# Patient Record
Sex: Female | Born: 1985 | Race: Black or African American | Hispanic: No | Marital: Single | Smoking: Current every day smoker
Health system: Southern US, Community
[De-identification: ages and names within clinical notes are randomized; demographics above are authoritative.]

## PROBLEM LIST (undated history)

## (undated) DIAGNOSIS — K219 Gastro-esophageal reflux disease without esophagitis: Secondary | ICD-10-CM

## (undated) DIAGNOSIS — K2970 Gastritis, unspecified, without bleeding: Secondary | ICD-10-CM

## (undated) DIAGNOSIS — M549 Dorsalgia, unspecified: Secondary | ICD-10-CM

## (undated) DIAGNOSIS — I880 Nonspecific mesenteric lymphadenitis: Secondary | ICD-10-CM

## (undated) DIAGNOSIS — Z720 Tobacco use: Secondary | ICD-10-CM

## (undated) DIAGNOSIS — E282 Polycystic ovarian syndrome: Secondary | ICD-10-CM

## (undated) DIAGNOSIS — I10 Essential (primary) hypertension: Secondary | ICD-10-CM

## (undated) DIAGNOSIS — E669 Obesity, unspecified: Secondary | ICD-10-CM

## (undated) HISTORY — PX: FRACTURE SURGERY: SHX138

## (undated) HISTORY — DX: Polycystic ovarian syndrome: E28.2

## (undated) HISTORY — PX: CARPAL TUNNEL RELEASE: SHX101

---
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
## 2010-07-24 ENCOUNTER — Inpatient Hospital Stay (HOSPITAL_COMMUNITY): Admission: EM | Admit: 2010-07-24 | Discharge: 2010-07-28 | Payer: Self-pay | Source: Home / Self Care

## 2010-07-26 LAB — CBC
HCT: 37.1 % (ref 36.0–46.0)
Hemoglobin: 12.8 g/dL (ref 12.0–15.0)
MCH: 29.8 pg (ref 26.0–34.0)
MCHC: 34.5 g/dL (ref 30.0–36.0)
MCV: 86.3 fL (ref 78.0–100.0)
Platelets: 306 10*3/uL (ref 150–400)
RBC: 4.3 MIL/uL (ref 3.87–5.11)
RDW: 12.6 % (ref 11.5–15.5)
WBC: 12.4 10*3/uL — ABNORMAL HIGH (ref 4.0–10.5)

## 2010-07-26 LAB — DIFFERENTIAL
Basophils Absolute: 0 10*3/uL (ref 0.0–0.1)
Basophils Relative: 0 % (ref 0–1)
Eosinophils Absolute: 0.2 10*3/uL (ref 0.0–0.7)
Eosinophils Relative: 2 % (ref 0–5)
Lymphocytes Relative: 22 % (ref 12–46)
Lymphs Abs: 2.7 10*3/uL (ref 0.7–4.0)
Monocytes Absolute: 1.1 10*3/uL — ABNORMAL HIGH (ref 0.1–1.0)
Monocytes Relative: 9 % (ref 3–12)
Neutro Abs: 8.4 10*3/uL — ABNORMAL HIGH (ref 1.7–7.7)
Neutrophils Relative %: 67 % (ref 43–77)

## 2010-07-26 LAB — BASIC METABOLIC PANEL
BUN: 2 mg/dL — ABNORMAL LOW (ref 6–23)
CO2: 25 mEq/L (ref 19–32)
Calcium: 8.4 mg/dL (ref 8.4–10.5)
Chloride: 107 mEq/L (ref 96–112)
Creatinine, Ser: 0.68 mg/dL (ref 0.4–1.2)
GFR calc Af Amer: >60 mL/min (ref >60)
GFR calc non Af Amer: >60 mL/min (ref >60)
Glucose, Bld: 75 mg/dL (ref 70–99)
Potassium: 3.3 mEq/L — ABNORMAL LOW (ref 3.5–5.1)
Sodium: 140 mEq/L (ref 135–145)

## 2010-07-27 LAB — CBC
HCT: 36.4 % (ref 36.0–46.0)
Hemoglobin: 12.7 g/dL (ref 12.0–15.0)
MCH: 29.9 pg (ref 26.0–34.0)
MCHC: 34.9 g/dL (ref 30.0–36.0)
MCV: 85.6 fL (ref 78.0–100.0)
Platelets: 278 10*3/uL (ref 150–400)
RBC: 4.25 MIL/uL (ref 3.87–5.11)
RDW: 12.5 % (ref 11.5–15.5)
WBC: 8.4 10*3/uL (ref 4.0–10.5)

## 2010-07-27 LAB — BASIC METABOLIC PANEL
BUN: 5 mg/dL — ABNORMAL LOW (ref 6–23)
CO2: 22 mEq/L (ref 19–32)
Calcium: 8.3 mg/dL — ABNORMAL LOW (ref 8.4–10.5)
Chloride: 107 mEq/L (ref 96–112)
Creatinine, Ser: 0.75 mg/dL (ref 0.4–1.2)
GFR calc Af Amer: >60 mL/min (ref >60)
GFR calc non Af Amer: >60 mL/min (ref >60)
Glucose, Bld: 92 mg/dL (ref 70–99)
Potassium: 3.4 mEq/L — ABNORMAL LOW (ref 3.5–5.1)
Sodium: 138 mEq/L (ref 135–145)

## 2010-07-27 LAB — DIFFERENTIAL
Basophils Absolute: 0 10*3/uL (ref 0.0–0.1)
Basophils Relative: 0 % (ref 0–1)
Eosinophils Absolute: 0.3 10*3/uL (ref 0.0–0.7)
Eosinophils Relative: 4 % (ref 0–5)
Lymphocytes Relative: 28 % (ref 12–46)
Lymphs Abs: 2.3 10*3/uL (ref 0.7–4.0)
Monocytes Absolute: 1 10*3/uL (ref 0.1–1.0)
Monocytes Relative: 11 % (ref 3–12)
Neutro Abs: 4.8 10*3/uL (ref 1.7–7.7)
Neutrophils Relative %: 57 % (ref 43–77)

## 2010-07-27 LAB — LIPID PANEL
Cholesterol: 121 mg/dL (ref 0–200)
HDL: 35 mg/dL — ABNORMAL LOW (ref >39)
LDL Cholesterol: 72 mg/dL (ref 0–99)
Total CHOL/HDL Ratio: 3.5 RATIO
Triglycerides: 72 mg/dL (ref <150)
VLDL: 14 mg/dL (ref 0–40)

## 2010-07-27 LAB — MAGNESIUM: Magnesium: 1.8 mg/dL (ref 1.5–2.5)

## 2010-07-27 LAB — PHOSPHORUS: Phosphorus: 3 mg/dL (ref 2.3–4.6)

## 2010-07-28 LAB — BASIC METABOLIC PANEL
BUN: 4 mg/dL — ABNORMAL LOW (ref 6–23)
CO2: 26 mEq/L (ref 19–32)
Calcium: 9 mg/dL (ref 8.4–10.5)
Chloride: 104 mEq/L (ref 96–112)
Creatinine, Ser: 0.8 mg/dL (ref 0.4–1.2)
GFR calc Af Amer: >60 mL/min (ref >60)
GFR calc non Af Amer: >60 mL/min (ref >60)
Glucose, Bld: 79 mg/dL (ref 70–99)
Potassium: 3.6 mEq/L (ref 3.5–5.1)
Sodium: 139 mEq/L (ref 135–145)

## 2010-07-28 LAB — DIFFERENTIAL
Basophils Absolute: 0 10*3/uL (ref 0.0–0.1)
Basophils Relative: 0 % (ref 0–1)
Eosinophils Absolute: 0.3 10*3/uL (ref 0.0–0.7)
Eosinophils Relative: 3 % (ref 0–5)
Lymphocytes Relative: 26 % (ref 12–46)
Lymphs Abs: 2.2 10*3/uL (ref 0.7–4.0)
Monocytes Absolute: 1 10*3/uL (ref 0.1–1.0)
Monocytes Relative: 12 % (ref 3–12)
Neutro Abs: 5.1 10*3/uL (ref 1.7–7.7)
Neutrophils Relative %: 59 % (ref 43–77)

## 2010-07-28 LAB — CBC
HCT: 40.2 % (ref 36.0–46.0)
Hemoglobin: 14.4 g/dL (ref 12.0–15.0)
MCH: 30.6 pg (ref 26.0–34.0)
MCHC: 35.8 g/dL (ref 30.0–36.0)
MCV: 85.4 fL (ref 78.0–100.0)
Platelets: 310 10*3/uL (ref 150–400)
RBC: 4.71 MIL/uL (ref 3.87–5.11)
RDW: 12.2 % (ref 11.5–15.5)
WBC: 8.5 10*3/uL (ref 4.0–10.5)

## 2010-07-28 LAB — MAGNESIUM: Magnesium: 1.9 mg/dL (ref 1.5–2.5)

## 2010-10-02 LAB — COMPREHENSIVE METABOLIC PANEL
ALT: 17 U/L (ref 0–35)
AST: 20 U/L (ref 0–37)
Albumin: 4.5 g/dL (ref 3.5–5.2)
Alkaline Phosphatase: 98 U/L (ref 39–117)
BUN: 7 mg/dL (ref 6–23)
CO2: 26 mEq/L (ref 19–32)
Calcium: 9 mg/dL (ref 8.4–10.5)
Chloride: 106 mEq/L (ref 96–112)
Creatinine, Ser: 0.72 mg/dL (ref 0.4–1.2)
GFR calc Af Amer: >60 mL/min (ref >60)
GFR calc non Af Amer: >60 mL/min (ref >60)
Glucose, Bld: 138 mg/dL — ABNORMAL HIGH (ref 70–99)
Potassium: 4.1 mEq/L (ref 3.5–5.1)
Sodium: 140 mEq/L (ref 135–145)
Total Bilirubin: 0.6 mg/dL (ref 0.3–1.2)
Total Protein: 8.1 g/dL (ref 6.0–8.3)

## 2010-10-02 LAB — DIFFERENTIAL
Basophils Absolute: 0 10*3/uL (ref 0.0–0.1)
Basophils Relative: 0 % (ref 0–1)
Eosinophils Absolute: 0 10*3/uL (ref 0.0–0.7)
Eosinophils Relative: 0 % (ref 0–5)
Lymphocytes Relative: 8 % — ABNORMAL LOW (ref 12–46)
Lymphs Abs: 1.5 10*3/uL (ref 0.7–4.0)
Monocytes Absolute: 0.8 10*3/uL (ref 0.1–1.0)
Monocytes Relative: 4 % (ref 3–12)
Neutro Abs: 16.5 10*3/uL — ABNORMAL HIGH (ref 1.7–7.7)
Neutrophils Relative %: 88 % — ABNORMAL HIGH (ref 43–77)

## 2010-10-02 LAB — URINALYSIS, ROUTINE W REFLEX MICROSCOPIC
Bilirubin Urine: NEGATIVE
Glucose, UA: NEGATIVE mg/dL
Hgb urine dipstick: NEGATIVE
Ketones, ur: NEGATIVE mg/dL
Nitrite: NEGATIVE
Protein, ur: NEGATIVE mg/dL
Specific Gravity, Urine: 1.02 (ref 1.005–1.030)
Urobilinogen, UA: 0.2 mg/dL (ref 0.0–1.0)
pH: 7.5 (ref 5.0–8.0)

## 2010-10-02 LAB — CBC
HCT: 42.8 % (ref 36.0–46.0)
Hemoglobin: 14.6 g/dL (ref 12.0–15.0)
MCH: 29.8 pg (ref 26.0–34.0)
MCHC: 34.1 g/dL (ref 30.0–36.0)
MCV: 87.3 fL (ref 78.0–100.0)
Platelets: 332 10*3/uL (ref 150–400)
RBC: 4.9 MIL/uL (ref 3.87–5.11)
RDW: 12.7 % (ref 11.5–15.5)
WBC: 18.8 10*3/uL — ABNORMAL HIGH (ref 4.0–10.5)

## 2010-10-02 LAB — MRSA CULTURE

## 2010-10-02 LAB — LIPASE, BLOOD: Lipase: 29 U/L (ref 11–59)

## 2010-10-02 LAB — MRSA PCR SCREENING

## 2010-10-02 LAB — POCT PREGNANCY, URINE: Preg Test, Ur: NEGATIVE

## 2015-01-22 ENCOUNTER — Encounter (HOSPITAL_BASED_OUTPATIENT_CLINIC_OR_DEPARTMENT_OTHER): Payer: Self-pay | Admitting: *Deleted

## 2015-01-22 ENCOUNTER — Emergency Department (HOSPITAL_BASED_OUTPATIENT_CLINIC_OR_DEPARTMENT_OTHER)
Admission: EM | Admit: 2015-01-22 | Discharge: 2015-01-22 | Disposition: A | Discharge status: Home / Self Care | Payer: No Typology Code available for payment source | Attending: Emergency Medicine | Admitting: Emergency Medicine

## 2015-01-22 ENCOUNTER — Emergency Department (HOSPITAL_BASED_OUTPATIENT_CLINIC_OR_DEPARTMENT_OTHER): Payer: No Typology Code available for payment source

## 2015-01-22 DIAGNOSIS — Z3202 Encounter for pregnancy test, result negative: Secondary | ICD-10-CM | POA: Insufficient documentation

## 2015-01-22 DIAGNOSIS — R1084 Generalized abdominal pain: Secondary | ICD-10-CM | POA: Insufficient documentation

## 2015-01-22 DIAGNOSIS — K219 Gastro-esophageal reflux disease without esophagitis: Secondary | ICD-10-CM | POA: Diagnosis not present

## 2015-01-22 DIAGNOSIS — R109 Unspecified abdominal pain: Secondary | ICD-10-CM

## 2015-01-22 DIAGNOSIS — Z720 Tobacco use: Secondary | ICD-10-CM | POA: Diagnosis not present

## 2015-01-22 DIAGNOSIS — R110 Nausea: Secondary | ICD-10-CM | POA: Insufficient documentation

## 2015-01-22 DIAGNOSIS — R52 Pain, unspecified: Secondary | ICD-10-CM

## 2015-01-22 HISTORY — DX: Gastro-esophageal reflux disease without esophagitis: K21.9

## 2015-01-22 LAB — CBC WITH DIFFERENTIAL/PLATELET
Basophils Absolute: 0 10*3/uL (ref 0.0–0.1)
Basophils Relative: 0 % (ref 0–1)
Eosinophils Absolute: 0 10*3/uL (ref 0.0–0.7)
Eosinophils Relative: 0 % (ref 0–5)
HEMATOCRIT: 46.6 % — AB (ref 36.0–46.0)
HEMOGLOBIN: 16 g/dL — AB (ref 12.0–15.0)
LYMPHS ABS: 2.6 10*3/uL (ref 0.7–4.0)
Lymphocytes Relative: 14 % (ref 12–46)
MCH: 30 pg (ref 26.0–34.0)
MCHC: 34.3 g/dL (ref 30.0–36.0)
MCV: 87.3 fL (ref 78.0–100.0)
MONO ABS: 0.6 10*3/uL (ref 0.1–1.0)
MONOS PCT: 3 % (ref 3–12)
NEUTROS PCT: 83 % — AB (ref 43–77)
Neutro Abs: 16.3 10*3/uL — ABNORMAL HIGH (ref 1.7–7.7)
Platelets: 394 10*3/uL (ref 150–400)
RBC: 5.34 MIL/uL — ABNORMAL HIGH (ref 3.87–5.11)
RDW: 12.9 % (ref 11.5–15.5)
WBC: 19.6 10*3/uL — ABNORMAL HIGH (ref 4.0–10.5)

## 2015-01-22 LAB — COMPREHENSIVE METABOLIC PANEL
ALBUMIN: 4.4 g/dL (ref 3.5–5.0)
ALT: 24 U/L (ref 14–54)
AST: 32 U/L (ref 15–41)
Alkaline Phosphatase: 92 U/L (ref 38–126)
Anion gap: 12 (ref 5–15)
BUN: 13 mg/dL (ref 6–20)
CALCIUM: 9.3 mg/dL (ref 8.9–10.3)
CHLORIDE: 100 mmol/L — AB (ref 101–111)
CO2: 25 mmol/L (ref 22–32)
Creatinine, Ser: 0.77 mg/dL (ref 0.44–1.00)
GFR calc Af Amer: >60 mL/min (ref >60)
GFR calc non Af Amer: >60 mL/min (ref >60)
GLUCOSE: 129 mg/dL — AB (ref 65–99)
Potassium: 3.1 mmol/L — ABNORMAL LOW (ref 3.5–5.1)
SODIUM: 137 mmol/L (ref 135–145)
Total Bilirubin: 0.8 mg/dL (ref 0.3–1.2)
Total Protein: 8.4 g/dL — ABNORMAL HIGH (ref 6.5–8.1)

## 2015-01-22 LAB — URINALYSIS, ROUTINE W REFLEX MICROSCOPIC
BILIRUBIN URINE: NEGATIVE
Glucose, UA: NEGATIVE mg/dL
KETONES UR: NEGATIVE mg/dL
Leukocytes, UA: NEGATIVE
Nitrite: NEGATIVE
PROTEIN: NEGATIVE mg/dL
SPECIFIC GRAVITY, URINE: 1.01 (ref 1.005–1.030)
Urobilinogen, UA: 0.2 mg/dL (ref 0.0–1.0)
pH: 6 (ref 5.0–8.0)

## 2015-01-22 LAB — URINE MICROSCOPIC-ADD ON

## 2015-01-22 LAB — LIPASE, BLOOD: Lipase: 12 U/L — ABNORMAL LOW (ref 22–51)

## 2015-01-22 LAB — PREGNANCY, URINE: Preg Test, Ur: NEGATIVE

## 2015-01-22 MED ORDER — HYDROCODONE-ACETAMINOPHEN 5-325 MG PO TABS: 2.0000 | ORAL_TABLET | ORAL | Status: DC | PRN

## 2015-01-22 MED ORDER — IOHEXOL 300 MG/ML  SOLN
100.0000 mL | Freq: Once | INTRAMUSCULAR | Status: AC | PRN
  Administered 2015-01-22: 100 mL via INTRAVENOUS

## 2015-01-22 MED ORDER — IOHEXOL 300 MG/ML  SOLN
50.0000 mL | Freq: Once | INTRAMUSCULAR | Status: AC | PRN
  Administered 2015-01-22: 50 mL via ORAL

## 2015-01-22 MED ORDER — ONDANSETRON 4 MG PO TBDP: 4.0000 mg | ORAL_TABLET | Freq: Three times a day (TID) | ORAL | Status: DC | PRN

## 2015-01-22 MED ORDER — HYDROMORPHONE HCL 1 MG/ML IJ SOLN
1.0000 mg | Freq: Once | INTRAMUSCULAR | Status: AC
  Administered 2015-01-22: 1 mg via INTRAVENOUS
  Filled 2015-01-22: qty 1

## 2015-01-22 MED ORDER — SODIUM CHLORIDE 0.9 % IV BOLUS (SEPSIS)
1000.0000 mL | Freq: Once | INTRAVENOUS | Status: AC
  Administered 2015-01-22: 1000 mL via INTRAVENOUS

## 2015-01-22 MED ORDER — GI COCKTAIL ~~LOC~~
30.0000 mL | Freq: Once | ORAL | Status: AC
  Administered 2015-01-22: 30 mL via ORAL
  Filled 2015-01-22: qty 30

## 2015-01-22 MED ORDER — ONDANSETRON HCL 4 MG/2ML IJ SOLN
4.0000 mg | Freq: Once | INTRAMUSCULAR | Status: AC
  Administered 2015-01-22: 4 mg via INTRAMUSCULAR
  Filled 2015-01-22: qty 2

## 2015-01-22 MED ORDER — FAMOTIDINE IN NACL 20-0.9 MG/50ML-% IV SOLN
20.0000 mg | Freq: Once | INTRAVENOUS | Status: AC
  Administered 2015-01-22: 20 mg via INTRAVENOUS
  Filled 2015-01-22: qty 50

## 2015-01-22 NOTE — ED Notes (Signed)
[place] NOT YET COMPLETED.  Scheduled due at 1400 on 01/23/2015.

## 2015-01-22 NOTE — ED Provider Notes (Signed)
CSN: [id number]     Arrival date & time 01/22/15  1741 History   First MD Initiated Contact with Patient 01/22/15 1818     Chief Complaint  Patient presents with  . Gastrophageal Reflux     (Consider location/radiation/quality/duration/timing/severity/associated sxs/prior Treatment) Patient is a 29 y.o. female presenting with abdominal pain. The history is provided by the patient. No language interpreter was used.  Abdominal Pain Pain location:  Generalized Pain quality: aching   Pain radiates to:  Does not radiate Pain severity:  Moderate Onset quality:  Gradual Timing:  Constant Progression:  Worsening Chronicity:  New Context: not previous surgeries and not recent illness   Worsened by:  Nothing tried Ineffective treatments:  None tried Associated symptoms: nausea   Associated symptoms: no diarrhea   Risk factors: no alcohol abuse   Pt report she has reflux and today she has vomitting.  Pt has been taking nexium over the counter.  Pt reports pain in upper abdomen.   Past Medical History  Diagnosis Date  . GERD (gastroesophageal reflux disease)    Past Surgical History  Procedure Laterality Date  . Ankle surgery     History reviewed. No pertinent family history. History  Substance Use Topics  . Smoking status: Current Every Day Smoker -- 0.50 packs/day    Types: Cigarettes  . Smokeless tobacco: Not on file  . Alcohol Use: No   OB History    No data available     Review of Systems  Gastrointestinal: Positive for nausea and abdominal pain. Negative for diarrhea.  All other systems reviewed and are negative.     Allergies  Review of patient's allergies indicates no known allergies.  Home Medications   Prior to Admission medications   Medication Sig Start Date End Date Taking? Authorizing Provider  omeprazole (PRILOSEC) 40 MG capsule Take 40 mg by mouth daily.   Yes Historical Provider, MD   BP 127/68 mmHg  Pulse 68  Temp(Src) 99.3 F (37.4 C) (Oral)   Resp 20  Ht 5\' 7"  (1.702 m)  Wt 212 lb (96.163 kg)  BMI 33.20 kg/m2  SpO2 100%  LMP 11/22/2014 Physical Exam  Constitutional: She is oriented to person, place, and time. She appears well-developed and well-nourished.  HENT:  Head: Normocephalic.  Eyes: Conjunctivae and EOM are normal. Pupils are equal, round, and reactive to light.  Neck: Normal range of motion.  Cardiovascular: Normal rate and intact distal pulses.   Pulmonary/Chest: Effort normal.  Abdominal: Soft. She exhibits no distension. There is tenderness.  Diffuse tenderness uper abdomen  Mid upper rand left abdomen.    Musculoskeletal: Normal range of motion.  Neurological: She is alert and oriented to person, place, and time.  Psychiatric: She has a normal mood and affect.  Nursing note and vitals reviewed.   ED Course  Procedures (including critical care time) Labs Review Labs Reviewed  CBC WITH DIFFERENTIAL/PLATELET - Abnormal; Notable for the following:    WBC 19.6 (*)    RBC 5.34 (*)    Hemoglobin 16.0 (*)    HCT 46.6 (*)    Neutrophils Relative % 83 (*)    Neutro Abs 16.3 (*)    All other components within normal limits  COMPREHENSIVE METABOLIC PANEL - Abnormal; Notable for the following:    Potassium 3.1 (*)    Chloride 100 (*)    Glucose, Bld 129 (*)    Total Protein 8.4 (*)    All other components within normal limits  LIPASE, BLOOD -  Abnormal; Notable for the following:    Lipase 12 (*)    All other components within normal limits  URINALYSIS, ROUTINE W REFLEX MICROSCOPIC (NOT AT [facility]) - Abnormal; Notable for the following:    APPearance CLOUDY (*)    Hgb urine dipstick LARGE (*)    All other components within normal limits  URINE MICROSCOPIC-ADD ON - Abnormal; Notable for the following:    Squamous Epithelial / LPF MANY (*)    Bacteria, UA MANY (*)    All other components within normal limits  PREGNANCY, URINE    Imaging Review No results found.   EKG Interpretation None      MDM  Pt  reports relief with zofran pepcic and dilaudid.  Pt reports discomfort increased after drinking contrast.  Ct scan shows no evidence of gallstones and normal bili duct.   Pt has elevated wbc's.    Pt advised to return here at 2pm tomorrow for ultrasound and recheck.   Pt to continue otc medications.     Final diagnoses:  Abdominal pain  Pain        Fransico Meadow, PA-C 01/22/15 [place], MD 01/22/15 [phone]

## 2015-01-22 NOTE — Discharge Instructions (Signed)

## 2015-01-22 NOTE — ED Notes (Signed)
Pt reports acid reflux with epigastric pain and vomiting bile since Thursday.

## 2015-01-23 ENCOUNTER — Inpatient Hospital Stay (HOSPITAL_BASED_OUTPATIENT_CLINIC_OR_DEPARTMENT_OTHER): Admit: 2015-01-23 | Payer: Self-pay

## 2015-01-23 ENCOUNTER — Encounter (HOSPITAL_BASED_OUTPATIENT_CLINIC_OR_DEPARTMENT_OTHER): Payer: Self-pay | Admitting: *Deleted

## 2015-01-23 ENCOUNTER — Inpatient Hospital Stay (HOSPITAL_BASED_OUTPATIENT_CLINIC_OR_DEPARTMENT_OTHER)
Admission: EM | Admit: 2015-01-23 | Discharge: 2015-01-25 | DRG: 392 | Disposition: A | Discharge status: Home / Self Care | Payer: No Typology Code available for payment source | Attending: Internal Medicine | Admitting: Internal Medicine

## 2015-01-23 DIAGNOSIS — Z79899 Other long term (current) drug therapy: Secondary | ICD-10-CM

## 2015-01-23 DIAGNOSIS — R7989 Other specified abnormal findings of blood chemistry: Secondary | ICD-10-CM

## 2015-01-23 DIAGNOSIS — M79662 Pain in left lower leg: Secondary | ICD-10-CM | POA: Diagnosis present

## 2015-01-23 DIAGNOSIS — R05 Cough: Secondary | ICD-10-CM | POA: Diagnosis present

## 2015-01-23 DIAGNOSIS — K210 Gastro-esophageal reflux disease with esophagitis: Secondary | ICD-10-CM | POA: Diagnosis present

## 2015-01-23 DIAGNOSIS — K859 Acute pancreatitis, unspecified: Secondary | ICD-10-CM

## 2015-01-23 DIAGNOSIS — M79609 Pain in unspecified limb: Secondary | ICD-10-CM | POA: Diagnosis not present

## 2015-01-23 DIAGNOSIS — R0789 Other chest pain: Secondary | ICD-10-CM | POA: Diagnosis not present

## 2015-01-23 DIAGNOSIS — R1013 Epigastric pain: Principal | ICD-10-CM | POA: Diagnosis present

## 2015-01-23 DIAGNOSIS — K219 Gastro-esophageal reflux disease without esophagitis: Secondary | ICD-10-CM | POA: Diagnosis present

## 2015-01-23 DIAGNOSIS — E876 Hypokalemia: Secondary | ICD-10-CM | POA: Diagnosis present

## 2015-01-23 DIAGNOSIS — R1110 Vomiting, unspecified: Secondary | ICD-10-CM

## 2015-01-23 DIAGNOSIS — R079 Chest pain, unspecified: Secondary | ICD-10-CM

## 2015-01-23 DIAGNOSIS — K922 Gastrointestinal hemorrhage, unspecified: Secondary | ICD-10-CM | POA: Diagnosis not present

## 2015-01-23 DIAGNOSIS — Z8249 Family history of ischemic heart disease and other diseases of the circulatory system: Secondary | ICD-10-CM

## 2015-01-23 DIAGNOSIS — F1210 Cannabis abuse, uncomplicated: Secondary | ICD-10-CM | POA: Diagnosis not present

## 2015-01-23 DIAGNOSIS — R748 Abnormal levels of other serum enzymes: Secondary | ICD-10-CM | POA: Diagnosis present

## 2015-01-23 DIAGNOSIS — R059 Cough, unspecified: Secondary | ICD-10-CM | POA: Diagnosis present

## 2015-01-23 DIAGNOSIS — K449 Diaphragmatic hernia without obstruction or gangrene: Secondary | ICD-10-CM | POA: Diagnosis present

## 2015-01-23 DIAGNOSIS — Z6834 Body mass index (BMI) 34.0-34.9, adult: Secondary | ICD-10-CM | POA: Diagnosis not present

## 2015-01-23 DIAGNOSIS — R109 Unspecified abdominal pain: Secondary | ICD-10-CM

## 2015-01-23 DIAGNOSIS — Z720 Tobacco use: Secondary | ICD-10-CM | POA: Diagnosis not present

## 2015-01-23 DIAGNOSIS — E669 Obesity, unspecified: Secondary | ICD-10-CM | POA: Diagnosis present

## 2015-01-23 DIAGNOSIS — R778 Other specified abnormalities of plasma proteins: Secondary | ICD-10-CM

## 2015-01-23 DIAGNOSIS — F17210 Nicotine dependence, cigarettes, uncomplicated: Secondary | ICD-10-CM | POA: Diagnosis present

## 2015-01-23 DIAGNOSIS — D72829 Elevated white blood cell count, unspecified: Secondary | ICD-10-CM | POA: Diagnosis present

## 2015-01-23 DIAGNOSIS — R072 Precordial pain: Secondary | ICD-10-CM | POA: Diagnosis not present

## 2015-01-23 HISTORY — DX: Obesity, unspecified: E66.9

## 2015-01-23 HISTORY — DX: Tobacco use: Z72.0

## 2015-01-23 HISTORY — DX: Nonspecific mesenteric lymphadenitis: I88.0

## 2015-01-23 LAB — CBC WITH DIFFERENTIAL/PLATELET
BASOS ABS: 0 10*3/uL (ref 0.0–0.1)
Basophils Relative: 0 % (ref 0–1)
Eosinophils Absolute: 0.1 10*3/uL (ref 0.0–0.7)
Eosinophils Relative: 1 % (ref 0–5)
HCT: 43.2 % (ref 36.0–46.0)
Hemoglobin: 14.5 g/dL (ref 12.0–15.0)
LYMPHS ABS: 5.5 10*3/uL — AB (ref 0.7–4.0)
LYMPHS PCT: 36 % (ref 12–46)
MCH: 29.7 pg (ref 26.0–34.0)
MCHC: 33.6 g/dL (ref 30.0–36.0)
MCV: 88.5 fL (ref 78.0–100.0)
MONO ABS: 1.2 10*3/uL — AB (ref 0.1–1.0)
Monocytes Relative: 8 % (ref 3–12)
Neutro Abs: 8.7 10*3/uL — ABNORMAL HIGH (ref 1.7–7.7)
Neutrophils Relative %: 55 % (ref 43–77)
PLATELETS: 340 10*3/uL (ref 150–400)
RBC: 4.88 MIL/uL (ref 3.87–5.11)
RDW: 12.9 % (ref 11.5–15.5)
WBC: 15.6 10*3/uL — AB (ref 4.0–10.5)

## 2015-01-23 LAB — COMPREHENSIVE METABOLIC PANEL
ALK PHOS: 80 U/L (ref 38–126)
ALT: 26 U/L (ref 14–54)
AST: 35 U/L (ref 15–41)
Albumin: 4.3 g/dL (ref 3.5–5.0)
Anion gap: 10 (ref 5–15)
BUN: 10 mg/dL (ref 6–20)
CALCIUM: 8.9 mg/dL (ref 8.9–10.3)
CO2: 28 mmol/L (ref 22–32)
Chloride: 101 mmol/L (ref 101–111)
Creatinine, Ser: 0.8 mg/dL (ref 0.44–1.00)
GFR calc Af Amer: >60 mL/min (ref >60)
GFR calc non Af Amer: >60 mL/min (ref >60)
Glucose, Bld: 115 mg/dL — ABNORMAL HIGH (ref 65–99)
Potassium: 2.8 mmol/L — ABNORMAL LOW (ref 3.5–5.1)
SODIUM: 139 mmol/L (ref 135–145)
TOTAL PROTEIN: 7.2 g/dL (ref 6.5–8.1)
Total Bilirubin: 0.5 mg/dL (ref 0.3–1.2)

## 2015-01-23 LAB — LIPASE, BLOOD: Lipase: 58 U/L — ABNORMAL HIGH (ref 22–51)

## 2015-01-23 LAB — URINALYSIS, ROUTINE W REFLEX MICROSCOPIC
Bilirubin Urine: NEGATIVE
GLUCOSE, UA: NEGATIVE mg/dL
Ketones, ur: NEGATIVE mg/dL
Leukocytes, UA: NEGATIVE
NITRITE: NEGATIVE
PH: 7 (ref 5.0–8.0)
Protein, ur: NEGATIVE mg/dL
SPECIFIC GRAVITY, URINE: 1.015 (ref 1.005–1.030)
Urobilinogen, UA: 0.2 mg/dL (ref 0.0–1.0)

## 2015-01-23 LAB — URINE MICROSCOPIC-ADD ON

## 2015-01-23 MED ORDER — MORPHINE SULFATE 4 MG/ML IJ SOLN
4.0000 mg | Freq: Once | INTRAMUSCULAR | Status: AC
  Administered 2015-01-23: 4 mg via INTRAVENOUS
  Filled 2015-01-23: qty 1

## 2015-01-23 MED ORDER — PANTOPRAZOLE SODIUM 40 MG IV SOLR
40.0000 mg | Freq: Once | INTRAVENOUS | Status: AC
  Administered 2015-01-23: 40 mg via INTRAVENOUS
  Filled 2015-01-23: qty 40

## 2015-01-23 MED ORDER — PROMETHAZINE HCL 25 MG/ML IJ SOLN
25.0000 mg | Freq: Four times a day (QID) | INTRAMUSCULAR | Status: DC | PRN
  Administered 2015-01-23: 25 mg via INTRAVENOUS
  Filled 2015-01-23: qty 1

## 2015-01-23 MED ORDER — SODIUM CHLORIDE 0.9 % IV BOLUS (SEPSIS)
1000.0000 mL | Freq: Once | INTRAVENOUS | Status: AC
  Administered 2015-01-23: 1000 mL via INTRAVENOUS

## 2015-01-23 MED ORDER — POTASSIUM CHLORIDE 10 MEQ/100ML IV SOLN
10.0000 meq | INTRAVENOUS | Status: AC
  Administered 2015-01-23 – 2015-01-24 (×2): 10 meq via INTRAVENOUS
  Filled 2015-01-23: qty 100

## 2015-01-23 MED ORDER — MORPHINE SULFATE 4 MG/ML IJ SOLN
4.0000 mg | INTRAMUSCULAR | Status: DC | PRN
  Administered 2015-01-23: 4 mg via INTRAVENOUS
  Filled 2015-01-23: qty 1

## 2015-01-23 MED ORDER — POTASSIUM CHLORIDE 10 MEQ/100ML IV SOLN
10.0000 meq | Freq: Once | INTRAVENOUS | Status: AC
  Administered 2015-01-23: 10 meq via INTRAVENOUS
  Filled 2015-01-23: qty 100

## 2015-01-23 MED ORDER — ONDANSETRON HCL 4 MG/2ML IJ SOLN
INTRAMUSCULAR | Status: AC
Start: 2015-01-23 — End: 2015-01-23
  Administered 2015-01-23: 4 mg
  Filled 2015-01-23: qty 2

## 2015-01-23 NOTE — ED Notes (Signed)
Pt reports n/v and abdominal pain since Thursday. Seen here for the same yesterday, was scheduled for u/s this morning, unable to come because she was feeling sick.

## 2015-01-23 NOTE — ED Provider Notes (Addendum)
CSN: [id number]     Arrival date & time 01/23/15  2046 History   This chart was scribed for  Jasmine Shanks, MD by Altamease Oiler, ED Scribe. This patient was seen in room MH09/MH09 and the patient's care was started at 9:14 PM.    Chief Complaint  Patient presents with  . Abdominal Pain      HPI Jasmine Mason is a 29 y.o. female who presents to the Emergency Department complaining of constant upper abdominal pain with onset 3-4 days PTA. The pain radiates to the back and chest and pt notes that it significantly worsened yesterday. This pain is different than her usual pain with GERD but she notes being unable to keep down her GERD medication. Associated symptoms include subjective fever,  nausea for 4 days, several episodes of emesis per day, 1 day of diarrhea, and decreased urinary output. Pt denies rash, dysuria, and LE swelling or pain. She was seen in the ED yesterday for the similar symptoms where she had a CT A/P and was to f/u for an abdominal US today. She did not f/u for the ultrasound because she was feeling to poorly.  Past Medical History  Diagnosis Date  . GERD (gastroesophageal reflux disease)    Past Surgical History  Procedure Laterality Date  . Ankle surgery     No family history on file. History  Substance Use Topics  . Smoking status: Current Every Day Smoker -- 0.50 packs/day    Types: Cigarettes  . Smokeless tobacco: Not on file  . Alcohol Use: No   OB History    No data available     Review of Systems  Constitutional: Positive for fever (subjective).  Cardiovascular: Negative for leg swelling.  Gastrointestinal: Positive for nausea, vomiting, abdominal pain and diarrhea.  Genitourinary: Positive for decreased urine volume. Negative for dysuria.  Skin: Negative for rash.  10 Systems reviewed and are negative for acute change except as noted in the HPI.     Allergies  Review of patient's allergies indicates no known allergies.  Home Medications    Prior to Admission medications   Medication Sig Start Date End Date Taking? Authorizing Provider  HYDROcodone-acetaminophen (NORCO/VICODIN) 5-325 MG per tablet Take 2 tablets by mouth every 4 (four) hours as needed. 01/22/15   Fransico Meadow, PA-C  omeprazole (PRILOSEC) 40 MG capsule Take 40 mg by mouth daily.    Historical Provider, MD  ondansetron (ZOFRAN ODT) 4 MG disintegrating tablet Take 1 tablet (4 mg total) by mouth every 8 (eight) hours as needed for nausea or vomiting. 01/22/15   Fransico Meadow, PA-C   Triage Vitals: BP 169/106 mmHg  Pulse 84  Temp(Src) 98.5 F (36.9 C) (Oral)  Resp 23  Ht 5\' 7"  (1.702 m)  Wt 212 lb (96.163 kg)  BMI 33.20 kg/m2  SpO2 98%  LMP 11/22/2014  Physical Exam  Constitutional: She is oriented to person, place, and time. She appears well-developed and well-nourished.  The patient appears uncomfortable. She does not have acute respiratory distress. She is nontoxic.  HENT:  Head: Normocephalic and atraumatic.  Mouth/Throat: Oropharynx is clear and moist.  Eyes: Conjunctivae and EOM are normal. Pupils are equal, round, and reactive to light.  Neck: Neck supple.  Cardiovascular: Normal rate, regular rhythm, normal heart sounds and intact distal pulses.   Pulmonary/Chest: Effort normal and breath sounds normal.  Abdominal: Soft. Bowel sounds are normal. She exhibits no distension. There is tenderness.  Patient endorses tenderness through the mid and  epigastric area of the abdomen. She is not having guarding or rebound.  Musculoskeletal: Normal range of motion. She exhibits no edema or tenderness.  Neurological: She is alert and oriented to person, place, and time. She has normal strength. No cranial nerve deficit. She exhibits normal muscle tone. Coordination normal. GCS eye subscore is 4. GCS verbal subscore is 5. GCS motor subscore is 6.  Skin: Skin is warm, dry and intact.  Psychiatric: She has a normal mood and affect.    ED Course  Procedures   DIAGNOSTIC STUDIES: Oxygen Saturation is 98% on RA, normal by my interpretation.    COORDINATION OF CARE: 9:16 PM Discussed treatment plan which includes IVF, morphine, Zofran, and lab work with pt at bedside and pt agreed to plan.  10:33 PM-Consult complete with HOSPITALIST. Patient case explained and discussed. HOSPITALISTagrees to admit patient for further evaluation and treatment. Call ended at 10:35 PM.   Labs Review Labs Reviewed  COMPREHENSIVE METABOLIC PANEL - Abnormal; Notable for the following:    Potassium 2.8 (*)    Glucose, Bld 115 (*)    All other components within normal limits  LIPASE, BLOOD - Abnormal; Notable for the following:    Lipase 58 (*)    All other components within normal limits  CBC WITH DIFFERENTIAL/PLATELET - Abnormal; Notable for the following:    WBC 15.6 (*)    Neutro Abs 8.7 (*)    Lymphs Abs 5.5 (*)    Monocytes Absolute 1.2 (*)    All other components within normal limits  URINALYSIS, ROUTINE W REFLEX MICROSCOPIC (NOT AT [facility]) - Abnormal; Notable for the following:    APPearance CLOUDY (*)    Hgb urine dipstick LARGE (*)    All other components within normal limits  URINE MICROSCOPIC-ADD ON - Abnormal; Notable for the following:    Squamous Epithelial / LPF MANY (*)    Bacteria, UA MANY (*)    All other components within normal limits    Imaging Review Ct Abdomen Pelvis W Contrast  01/22/2015   CLINICAL DATA:  Epigastric pain and vomiting, 3 days duration. Gross hematuria is well. Elevated white count.  EXAM: CT ABDOMEN AND PELVIS WITH CONTRAST  TECHNIQUE: Multidetector CT imaging of the abdomen and pelvis was performed using the standard protocol following bolus administration of intravenous contrast.  CONTRAST:  71mL OMNIPAQUE IOHEXOL 300 MG/ML SOLN, 115mL OMNIPAQUE IOHEXOL 300 MG/ML SOLN  COMPARISON:  07/25/2010  FINDINGS: Lung bases are clear. No pleural or pericardial fluid. The liver has normal appearance without focal lesions or biliary  ductal dilatation. No calcified gallstones. The spleen is normal. The pancreas is normal. The adrenal glands are normal. The kidneys are normal. No cyst, mass, stone or hydronephrosis. The aorta and IVC are normal. No retroperitoneal mass or adenopathy. No free intraperitoneal fluid or air. No visible bowel pathology. The appendix looks normal. The bladder is normal. The uterus is retroverted. The ovaries appear slightly prominent but otherwise unremarkable, possibly because of the retroverted position. No free fluid. No significant bony finding.  Compared to the previous study, no change is appreciated except for resolution of enlarged nodes in the right lower quadrant mesenteric previously seen but normal today.  IMPRESSION: No abnormality seen to explain the presenting symptoms.   Electronically Signed   By: Nelson Chimes M.D.   On: 01/22/2015 22:05     EKG Interpretation None      MDM   Final diagnoses:  Acute pancreatitis, unspecified pancreatitis type  Intractable  vomiting with nausea, vomiting of unspecified type  Hypokalemia   Patient had been seen yesterday evening. CT scan did not show acute surgical etiology yesterday evening. She returns with intractable vomiting and epigastric pain. Her lipase has elevated. The patient will be admitted for ongoing hydration and pain control. Further diagnostic evaluation as needed.    Jasmine Shanks, MD 01/23/15 1914  Jasmine Shanks, MD 01/23/15 [phone]

## 2015-01-24 ENCOUNTER — Encounter (HOSPITAL_COMMUNITY): Payer: Self-pay | Admitting: Internal Medicine

## 2015-01-24 ENCOUNTER — Inpatient Hospital Stay (HOSPITAL_COMMUNITY): Payer: No Typology Code available for payment source

## 2015-01-24 ENCOUNTER — Inpatient Hospital Stay (HOSPITAL_BASED_OUTPATIENT_CLINIC_OR_DEPARTMENT_OTHER): Payer: No Typology Code available for payment source

## 2015-01-24 DIAGNOSIS — R079 Chest pain, unspecified: Secondary | ICD-10-CM | POA: Diagnosis not present

## 2015-01-24 DIAGNOSIS — D72829 Elevated white blood cell count, unspecified: Secondary | ICD-10-CM

## 2015-01-24 DIAGNOSIS — R05 Cough: Secondary | ICD-10-CM

## 2015-01-24 DIAGNOSIS — R1013 Epigastric pain: Principal | ICD-10-CM

## 2015-01-24 DIAGNOSIS — Z720 Tobacco use: Secondary | ICD-10-CM | POA: Insufficient documentation

## 2015-01-24 DIAGNOSIS — I880 Nonspecific mesenteric lymphadenitis: Secondary | ICD-10-CM | POA: Insufficient documentation

## 2015-01-24 DIAGNOSIS — E669 Obesity, unspecified: Secondary | ICD-10-CM | POA: Diagnosis present

## 2015-01-24 DIAGNOSIS — E876 Hypokalemia: Secondary | ICD-10-CM

## 2015-01-24 DIAGNOSIS — K219 Gastro-esophageal reflux disease without esophagitis: Secondary | ICD-10-CM | POA: Diagnosis present

## 2015-01-24 DIAGNOSIS — R778 Other specified abnormalities of plasma proteins: Secondary | ICD-10-CM

## 2015-01-24 DIAGNOSIS — R059 Cough, unspecified: Secondary | ICD-10-CM | POA: Diagnosis present

## 2015-01-24 DIAGNOSIS — R7989 Other specified abnormal findings of blood chemistry: Secondary | ICD-10-CM

## 2015-01-24 DIAGNOSIS — R109 Unspecified abdominal pain: Secondary | ICD-10-CM | POA: Diagnosis present

## 2015-01-24 DIAGNOSIS — M79609 Pain in unspecified limb: Secondary | ICD-10-CM

## 2015-01-24 DIAGNOSIS — M79662 Pain in left lower leg: Secondary | ICD-10-CM | POA: Diagnosis present

## 2015-01-24 LAB — COMPREHENSIVE METABOLIC PANEL
ALBUMIN: 3.6 g/dL (ref 3.5–5.0)
ALK PHOS: 69 U/L (ref 38–126)
ALT: 23 U/L (ref 14–54)
AST: 29 U/L (ref 15–41)
Anion gap: 9 (ref 5–15)
CO2: 28 mmol/L (ref 22–32)
CREATININE: 0.77 mg/dL (ref 0.44–1.00)
Calcium: 8.2 mg/dL — ABNORMAL LOW (ref 8.9–10.3)
Chloride: 103 mmol/L (ref 101–111)
GFR calc Af Amer: >60 mL/min (ref >60)
GFR calc non Af Amer: >60 mL/min (ref >60)
Glucose, Bld: 103 mg/dL — ABNORMAL HIGH (ref 65–99)
Potassium: 3.4 mmol/L — ABNORMAL LOW (ref 3.5–5.1)
SODIUM: 140 mmol/L (ref 135–145)
TOTAL PROTEIN: 6.5 g/dL (ref 6.5–8.1)
Total Bilirubin: 0.8 mg/dL (ref 0.3–1.2)

## 2015-01-24 LAB — CBC
HEMATOCRIT: 39.9 % (ref 36.0–46.0)
Hemoglobin: 13.4 g/dL (ref 12.0–15.0)
MCH: 30 pg (ref 26.0–34.0)
MCHC: 33.6 g/dL (ref 30.0–36.0)
MCV: 89.3 fL (ref 78.0–100.0)
PLATELETS: 296 10*3/uL (ref 150–400)
RBC: 4.47 MIL/uL (ref 3.87–5.11)
RDW: 13 % (ref 11.5–15.5)
WBC: 13.8 10*3/uL — ABNORMAL HIGH (ref 4.0–10.5)

## 2015-01-24 LAB — RAPID URINE DRUG SCREEN, HOSP PERFORMED
Amphetamines: NOT DETECTED
Barbiturates: NOT DETECTED
Benzodiazepines: NOT DETECTED
Cocaine: NOT DETECTED
Opiates: POSITIVE — AB
Tetrahydrocannabinol: POSITIVE — AB

## 2015-01-24 LAB — LIPID PANEL
Cholesterol: 146 mg/dL (ref 0–200)
HDL: 36 mg/dL — AB (ref >40)
LDL Cholesterol: 95 mg/dL (ref 0–99)
Total CHOL/HDL Ratio: 4.1 RATIO
Triglycerides: 73 mg/dL (ref <150)
VLDL: 15 mg/dL (ref 0–40)

## 2015-01-24 LAB — HIV ANTIBODY (ROUTINE TESTING W REFLEX): HIV Screen 4th Generation wRfx: NONREACTIVE

## 2015-01-24 LAB — GLUCOSE, CAPILLARY
GLUCOSE-CAPILLARY: 76 mg/dL (ref 65–99)
GLUCOSE-CAPILLARY: 89 mg/dL (ref 65–99)
Glucose-Capillary: 88 mg/dL (ref 65–99)
Glucose-Capillary: 82 mg/dL (ref 65–99)

## 2015-01-24 LAB — STREP PNEUMONIAE URINARY ANTIGEN: Strep Pneumo Urinary Antigen: NEGATIVE

## 2015-01-24 LAB — PROTIME-INR
INR: 1.13 (ref 0.00–1.49)
PROTHROMBIN TIME: 14.7 s (ref 11.6–15.2)

## 2015-01-24 LAB — D-DIMER, QUANTITATIVE (NOT AT ARMC): D DIMER QUANT: 0.48 ug{FEU}/mL (ref 0.00–0.48)

## 2015-01-24 LAB — TROPONIN I
Troponin I: 0.06 ng/mL — ABNORMAL HIGH (ref <0.031)
Troponin I: <0.03 ng/mL (ref <0.031)

## 2015-01-24 LAB — APTT: APTT: 30 s (ref 24–37)

## 2015-01-24 LAB — MAGNESIUM: MAGNESIUM: 1.7 mg/dL (ref 1.7–2.4)

## 2015-01-24 MED ORDER — HEPARIN SODIUM (PORCINE) 5000 UNIT/ML IJ SOLN
5000.0000 [IU] | Freq: Three times a day (TID) | INTRAMUSCULAR | Status: DC
  Administered 2015-01-24 (×2): 5000 [IU] via SUBCUTANEOUS
  Filled 2015-01-24 (×3): qty 1

## 2015-01-24 MED ORDER — SODIUM CHLORIDE 0.9 % IV SOLN: INTRAVENOUS | Status: DC

## 2015-01-24 MED ORDER — SODIUM CHLORIDE 0.9 % IV SOLN
INTRAVENOUS | Status: DC
  Administered 2015-01-24: 07:00:00 via INTRAVENOUS

## 2015-01-24 MED ORDER — ONDANSETRON HCL 4 MG/2ML IJ SOLN: 4.0000 mg | Freq: Three times a day (TID) | INTRAMUSCULAR | Status: DC | PRN

## 2015-01-24 MED ORDER — ALBUTEROL SULFATE (2.5 MG/3ML) 0.083% IN NEBU: 2.5000 mg | INHALATION_SOLUTION | RESPIRATORY_TRACT | Status: DC | PRN

## 2015-01-24 MED ORDER — SODIUM CHLORIDE 0.9 % IJ SOLN
3.0000 mL | Freq: Two times a day (BID) | INTRAMUSCULAR | Status: DC
  Administered 2015-01-24 (×2): 3 mL via INTRAVENOUS

## 2015-01-24 MED ORDER — SODIUM CHLORIDE 0.9 % IV SOLN
INTRAVENOUS | Status: DC
  Administered 2015-01-24: 02:00:00 via INTRAVENOUS

## 2015-01-24 MED ORDER — ACETAMINOPHEN 650 MG RE SUPP
650.0000 mg | Freq: Four times a day (QID) | RECTAL | Status: DC | PRN
Start: 2015-01-24 — End: 2015-01-25

## 2015-01-24 MED ORDER — ALUM & MAG HYDROXIDE-SIMETH 200-200-20 MG/5ML PO SUSP: 30.0000 mL | Freq: Four times a day (QID) | ORAL | Status: DC | PRN

## 2015-01-24 MED ORDER — MORPHINE SULFATE 2 MG/ML IJ SOLN
2.0000 mg | INTRAMUSCULAR | Status: DC | PRN
  Administered 2015-01-24 – 2015-01-25 (×7): 2 mg via INTRAVENOUS
  Filled 2015-01-24 (×7): qty 1

## 2015-01-24 MED ORDER — POTASSIUM CHLORIDE IN NACL 40-0.9 MEQ/L-% IV SOLN
INTRAVENOUS | Status: AC
  Administered 2015-01-24 – 2015-01-25 (×2): 75 mL/h via INTRAVENOUS
  Filled 2015-01-24 (×2): qty 1000

## 2015-01-24 MED ORDER — NITROGLYCERIN 0.4 MG SL SUBL: 0.4000 mg | SUBLINGUAL_TABLET | SUBLINGUAL | Status: DC | PRN

## 2015-01-24 MED ORDER — SODIUM CHLORIDE 0.9 % IV BOLUS (SEPSIS)
1000.0000 mL | Freq: Once | INTRAVENOUS | Status: AC
  Administered 2015-01-24: 1000 mL via INTRAVENOUS

## 2015-01-24 MED ORDER — ACETAMINOPHEN 325 MG PO TABS
650.0000 mg | ORAL_TABLET | Freq: Four times a day (QID) | ORAL | Status: DC | PRN
Start: 2015-01-24 — End: 2015-01-25

## 2015-01-24 MED ORDER — ATORVASTATIN CALCIUM 40 MG PO TABS
40.0000 mg | ORAL_TABLET | Freq: Every day | ORAL | Status: DC
  Administered 2015-01-24: 40 mg via ORAL
  Filled 2015-01-24 (×2): qty 1

## 2015-01-24 MED ORDER — HYDRALAZINE HCL 20 MG/ML IJ SOLN: 5.0000 mg | INTRAMUSCULAR | Status: DC | PRN

## 2015-01-24 MED ORDER — DM-GUAIFENESIN ER 30-600 MG PO TB12
1.0000 | ORAL_TABLET | Freq: Two times a day (BID) | ORAL | Status: DC
  Administered 2015-01-24 – 2015-01-25 (×3): 1 via ORAL
  Filled 2015-01-24 (×4): qty 1

## 2015-01-24 MED ORDER — ASPIRIN 325 MG PO TABS
325.0000 mg | ORAL_TABLET | Freq: Every day | ORAL | Status: DC
  Administered 2015-01-24: 325 mg via ORAL
  Filled 2015-01-24: qty 1

## 2015-01-24 MED ORDER — ASPIRIN EC 81 MG PO TBEC
81.0000 mg | DELAYED_RELEASE_TABLET | Freq: Every day | ORAL | Status: DC
  Administered 2015-01-25: 81 mg via ORAL
  Filled 2015-01-24: qty 1

## 2015-01-24 MED ORDER — NICOTINE 21 MG/24HR TD PT24
21.0000 mg | MEDICATED_PATCH | Freq: Every day | TRANSDERMAL | Status: DC
  Administered 2015-01-24 – 2015-01-25 (×2): 21 mg via TRANSDERMAL
  Filled 2015-01-24 (×2): qty 1

## 2015-01-24 MED ORDER — POTASSIUM CHLORIDE CRYS ER 20 MEQ PO TBCR
40.0000 meq | EXTENDED_RELEASE_TABLET | Freq: Once | ORAL | Status: AC
  Administered 2015-01-24: 40 meq via ORAL
  Filled 2015-01-24: qty 2

## 2015-01-24 MED ORDER — PANTOPRAZOLE SODIUM 40 MG IV SOLR
40.0000 mg | INTRAVENOUS | Status: DC
  Administered 2015-01-24 – 2015-01-25 (×2): 40 mg via INTRAVENOUS
  Filled 2015-01-24 (×3): qty 40

## 2015-01-24 NOTE — Progress Notes (Signed)
  Echocardiogram 2D Echocardiogram has been performed.  Jasmine Mason M 01/24/2015, 10:09 AM

## 2015-01-24 NOTE — Consult Note (Signed)
Reason for Consult: ABM pain, nausea, and vomiting Referring Physician: [facility]  [name] HPI: This is a 29 year old female with an acute onset of epigastric abdominal pain, nausea, vomiting, chest pain and SOB.  She reports that her pain is in the epigastric region with radiation to her back.  In the ER her work up with the CT scan was negative for any abdominal pathology and her lipase was at58, however, her WBC was at 15.6.  On the day of admission she suffered from nausea and vomiting and she estimates that she vomited 10 times.  With the vomiting she felt that she vomited clots.  After the vomiting she reports a burning sensation in the epigastric region.  Cardiology evaluate the patient and there is no cardiac source.  The patient has a history of GERD and she also mentioned vomiting some clots and she had one melenic stool.  There is no anemia and she is also negative for her urine pregnancy test.  Past Medical History  Diagnosis Date  . GERD (gastroesophageal reflux disease)   . Tobacco abuse   . Mesenteric adenitis   . Obesity     Past Surgical History  Procedure Laterality Date  . Orif ankle fracture Left 1990's  . Carpal tunnel release Left ~ 2013  . Fracture surgery      Family History  Problem Relation Age of Onset  . Hypertension Mother   . Hypertension Father   . Hypertension Sister   . Diabetes Father   . Heart disease Father     Social History:  reports that she has been smoking Cigarettes.  She has a 2.5 pack-year smoking history. She does not have any smokeless tobacco history on file. She reports that she does not drink alcohol or use illicit drugs.  Allergies: No Known Allergies  Medications:  Scheduled: . [START ON 01/25/2015] aspirin EC  81 mg Oral Daily  . atorvastatin  40 mg Oral q1800  . dextromethorphan-guaiFENesin  1 tablet Oral BID  . nicotine  21 mg Transdermal Daily  . pantoprazole (PROTONIX) IV  40 mg Intravenous Q24H  . sodium  chloride  3 mL Intravenous Q12H   Continuous: . 0.9 % NaCl with KCl 40 mEq / L 75 mL/hr (01/24/15 1535)    Results for orders placed or performed during the hospital encounter of 01/23/15 (from the past 24 hour(s))  Comprehensive metabolic panel     Status: Abnormal   Collection Time: 01/23/15  9:15 PM  Result Value Ref Range   Sodium 139 135 - 145 mmol/L   Potassium 2.8 (L) 3.5 - 5.1 mmol/L   Chloride 101 101 - 111 mmol/L   CO2 28 22 - 32 mmol/L   Glucose, Bld 115 (H) 65 - 99 mg/dL   BUN 10 6 - 20 mg/dL   Creatinine, Ser 0.80 0.44 - 1.00 mg/dL   Calcium 8.9 8.9 - 10.3 mg/dL   Total Protein 7.2 6.5 - 8.1 g/dL   Albumin 4.3 3.5 - 5.0 g/dL   AST 35 15 - 41 U/L   ALT 26 14 - 54 U/L   Alkaline Phosphatase 80 38 - 126 U/L   Total Bilirubin 0.5 0.3 - 1.2 mg/dL   GFR calc non Af Amer >60 >60 mL/min   GFR calc Af Amer >60 >60 mL/min   Anion gap 10 5 - 15  Lipase, blood     Status: Abnormal   Collection Time: 01/23/15  9:15 PM  Result Value  Ref Range   Lipase 58 (H) 22 - 51 U/L  CBC with Differential     Status: Abnormal   Collection Time: 01/23/15  9:15 PM  Result Value Ref Range   WBC 15.6 (H) 4.0 - 10.5 K/uL   RBC 4.88 3.87 - 5.11 MIL/uL   Hemoglobin 14.5 12.0 - 15.0 g/dL   HCT 43.2 36.0 - 46.0 %   MCV 88.5 78.0 - 100.0 fL   MCH 29.7 26.0 - 34.0 pg   MCHC 33.6 30.0 - 36.0 g/dL   RDW 12.9 11.5 - 15.5 %   Platelets 340 150 - 400 K/uL   Neutrophils Relative % 55 43 - 77 %   Neutro Abs 8.7 (H) 1.7 - 7.7 K/uL   Lymphocytes Relative 36 12 - 46 %   Lymphs Abs 5.5 (H) 0.7 - 4.0 K/uL   Monocytes Relative 8 3 - 12 %   Monocytes Absolute 1.2 (H) 0.1 - 1.0 K/uL   Eosinophils Relative 1 0 - 5 %   Eosinophils Absolute 0.1 0.0 - 0.7 K/uL   Basophils Relative 0 0 - 1 %   Basophils Absolute 0.0 0.0 - 0.1 K/uL  Urinalysis, Routine w reflex microscopic (not at [facility])     Status: Abnormal   Collection Time: 01/23/15 10:35 PM  Result Value Ref Range   Color, Urine YELLOW YELLOW    APPearance CLOUDY (A) CLEAR   Specific Gravity, Urine 1.015 1.005 - 1.030   pH 7.0 5.0 - 8.0   Glucose, UA NEGATIVE NEGATIVE mg/dL   Hgb urine dipstick LARGE (A) NEGATIVE   Bilirubin Urine NEGATIVE NEGATIVE   Ketones, ur NEGATIVE NEGATIVE mg/dL   Protein, ur NEGATIVE NEGATIVE mg/dL   Urobilinogen, UA 0.2 0.0 - 1.0 mg/dL   Nitrite NEGATIVE NEGATIVE   Leukocytes, UA NEGATIVE NEGATIVE  Urine microscopic-add on     Status: Abnormal   Collection Time: 01/23/15 10:35 PM  Result Value Ref Range   Squamous Epithelial / LPF MANY (A) RARE   WBC, UA 3-6 <3 WBC/hpf   RBC / HPF 3-6 <3 RBC/hpf   Bacteria, UA MANY (A) RARE  Troponin I (q 6hr x 3)     Status: Abnormal   Collection Time: 01/24/15  3:52 AM  Result Value Ref Range   Troponin I 0.06 (H) <0.031 ng/mL  D-dimer, quantitative (not at [facility])     Status: None   Collection Time: 01/24/15  3:52 AM  Result Value Ref Range   D-Dimer, Quant 0.48 0.00 - 0.48 ug/mL-FEU  Lipid panel     Status: Abnormal   Collection Time: 01/24/15  3:52 AM  Result Value Ref Range   Cholesterol 146 0 - 200 mg/dL   Triglycerides 73 <150 mg/dL   HDL 36 (L) >40 mg/dL   Total CHOL/HDL Ratio 4.1 RATIO   VLDL 15 0 - 40 mg/dL   LDL Cholesterol 95 0 - 99 mg/dL  Magnesium     Status: None   Collection Time: 01/24/15  3:52 AM  Result Value Ref Range   Magnesium 1.7 1.7 - 2.4 mg/dL  HIV antibody     Status: None   Collection Time: 01/24/15  3:52 AM  Result Value Ref Range   HIV Screen 4th Generation wRfx Non Reactive Non Reactive  Comprehensive metabolic panel     Status: Abnormal   Collection Time: 01/24/15  3:52 AM  Result Value Ref Range   Sodium 140 135 - 145 mmol/L   Potassium 3.4 (L) 3.5 -  5.1 mmol/L   Chloride 103 101 - 111 mmol/L   CO2 28 22 - 32 mmol/L   Glucose, Bld 103 (H) 65 - 99 mg/dL   BUN <5 (L) 6 - 20 mg/dL   Creatinine, Ser 0.77 0.44 - 1.00 mg/dL   Calcium 8.2 (L) 8.9 - 10.3 mg/dL   Total Protein 6.5 6.5 - 8.1 g/dL   Albumin 3.6 3.5 - 5.0  g/dL   AST 29 15 - 41 U/L   ALT 23 14 - 54 U/L   Alkaline Phosphatase 69 38 - 126 U/L   Total Bilirubin 0.8 0.3 - 1.2 mg/dL   GFR calc non Af Amer >60 >60 mL/min   GFR calc Af Amer >60 >60 mL/min   Anion gap 9 5 - 15  CBC     Status: Abnormal   Collection Time: 01/24/15  3:52 AM  Result Value Ref Range   WBC 13.8 (H) 4.0 - 10.5 K/uL   RBC 4.47 3.87 - 5.11 MIL/uL   Hemoglobin 13.4 12.0 - 15.0 g/dL   HCT 39.9 36.0 - 46.0 %   MCV 89.3 78.0 - 100.0 fL   MCH 30.0 26.0 - 34.0 pg   MCHC 33.6 30.0 - 36.0 g/dL   RDW 13.0 11.5 - 15.5 %   Platelets 296 150 - 400 K/uL  Protime-INR     Status: None   Collection Time: 01/24/15  3:52 AM  Result Value Ref Range   Prothrombin Time 14.7 11.6 - 15.2 seconds   INR 1.13 0.00 - 1.49  APTT     Status: None   Collection Time: 01/24/15  3:52 AM  Result Value Ref Range   aPTT 30 24 - 37 seconds  Strep pneumoniae urinary antigen     Status: None   Collection Time: 01/24/15  4:41 AM  Result Value Ref Range   Strep Pneumo Urinary Antigen NEGATIVE NEGATIVE  Glucose, capillary     Status: None   Collection Time: 01/24/15  6:51 AM  Result Value Ref Range   Glucose-Capillary 88 65 - 99 mg/dL   Comment 1 Notify RN    Comment 2 Document in Chart   Troponin I (q 6hr x 3)     Status: None   Collection Time: 01/24/15  9:55 AM  Result Value Ref Range   Troponin I <0.03 <0.031 ng/mL  Glucose, capillary     Status: None   Collection Time: 01/24/15 11:41 AM  Result Value Ref Range   Glucose-Capillary 82 65 - 99 mg/dL  Troponin I (q 6hr x 3)     Status: None   Collection Time: 01/24/15  4:10 PM  Result Value Ref Range   Troponin I <0.03 <0.031 ng/mL  Glucose, capillary     Status: None   Collection Time: 01/24/15  4:37 PM  Result Value Ref Range   Glucose-Capillary 76 65 - 99 mg/dL     Dg Chest 2 View  01/24/2015   CLINICAL DATA:  Vomiting. Abdominal pain, chest pain, shortness of breath since Thursday.  EXAM: CHEST  2 VIEW  COMPARISON:  None.   FINDINGS: The heart size and mediastinal contours are within normal limits. Both lungs are clear. The visualized skeletal structures are unremarkable.  IMPRESSION: No active cardiopulmonary disease.   Electronically Signed   By: Rolm Baptise M.D.   On: 01/24/2015 09:28   US Abdomen Complete  01/24/2015   CLINICAL DATA:  Abdominal pain for 4 days.  Elevated LFTs.  EXAM: ULTRASOUND ABDOMEN  COMPLETE  COMPARISON:  01/22/2015  FINDINGS: Gallbladder: No gallstones or wall thickening visualized. No sonographic Murphy sign noted.  Common bile duct: Diameter: 0.2 cm.  Liver: No focal lesion identified. Within normal limits in parenchymal echogenicity.  IVC: No abnormality visualized.  Pancreas: Visualized portion unremarkable.  Spleen: Size and appearance within normal limits.  Right Kidney: Length: 11.6 cm. Echogenicity within normal limits. No mass or hydronephrosis visualized.  Left Kidney: Length: 11.2 cm. Echogenicity within normal limits. No mass or hydronephrosis visualized.  Abdominal aorta: No aneurysm visualized.  Other findings: None.  IMPRESSION: Normal abdominal ultrasound.   Electronically Signed   By: Markus Daft M.D.   On: 01/24/2015 09:50   Ct Abdomen Pelvis W Contrast  01/22/2015   CLINICAL DATA:  Epigastric pain and vomiting, 3 days duration. Gross hematuria is well. Elevated white count.  EXAM: CT ABDOMEN AND PELVIS WITH CONTRAST  TECHNIQUE: Multidetector CT imaging of the abdomen and pelvis was performed using the standard protocol following bolus administration of intravenous contrast.  CONTRAST:  71mL OMNIPAQUE IOHEXOL 300 MG/ML SOLN, 130mL OMNIPAQUE IOHEXOL 300 MG/ML SOLN  COMPARISON:  07/25/2010  FINDINGS: Lung bases are clear. No pleural or pericardial fluid. The liver has normal appearance without focal lesions or biliary ductal dilatation. No calcified gallstones. The spleen is normal. The pancreas is normal. The adrenal glands are normal. The kidneys are normal. No cyst, mass, stone or  hydronephrosis. The aorta and IVC are normal. No retroperitoneal mass or adenopathy. No free intraperitoneal fluid or air. No visible bowel pathology. The appendix looks normal. The bladder is normal. The uterus is retroverted. The ovaries appear slightly prominent but otherwise unremarkable, possibly because of the retroverted position. No free fluid. No significant bony finding.  Compared to the previous study, no change is appreciated except for resolution of enlarged nodes in the right lower quadrant mesenteric previously seen but normal today.  IMPRESSION: No abnormality seen to explain the presenting symptoms.   Electronically Signed   By: Nelson Chimes M.D.   On: 01/22/2015 22:05    ROS:  As stated above in the HPI otherwise negative.  Blood pressure 133/88, pulse 79, temperature 98.1 F (36.7 C), temperature source Oral, resp. rate 18, height 5\' 7"  (1.702 m), weight 100.109 kg (220 lb 11.2 oz), last menstrual period 01/19/2015, SpO2 100 %.    PE: Gen: NAD, Alert and Oriented HEENT:  [place]/AT, EOMI Neck: Supple, no LAD Lungs: CTA Bilaterally CV: RRR without M/G/R ABM: Soft, minimal epigastric pain, +BS Ext: No C/C/E  Assessment/Plan: 1) Hematemesis. 2) One melenic stool. 3) Epigastric pain.   She states that she is better at this time with the pain medication.  The patient may have esophagitis versus an exacerbation of GERD.  I cannot be certain at this time.  With the reports of hematemesis, a melenic bowel movement, and some elevation in her WBC, it is not unreasonable to perform an EGD.  Plan: 1) EGD tomorrow with Dr. Henrene Pastor.  Jae Skeet D 01/24/2015, 6:41 PM

## 2015-01-24 NOTE — H&P (Signed)
[facility] History and Physical  Nury Nebergall BMW:[id number] DOB: [birth year] DOA: 01/23/2015  Referring physician: ED physician PCP: No primary care provider on file.  Specialists:   Chief Complaint: Abdominal pain, nausea, vomiting, chest pain, shortness of breath, cough, left calf pain  HPI: Jasmine Mason is a 29 y.o. female with PMH of GERD, who presents with nausea, vomiting, abdominal pain, chest pain, cough, shortness of breath, left calf pain.  Patient reports that she has been having nausea, vomiting and abdominal pain in the past 5 days. Abdominal pain is moderate, located in central and epigastric area, constant, radiating to her back. She vomited more than 10 times today. She does not have diarrhea, but reports having one "black stool". She also reports chest pain, which is started yesterday. The chest pain is located substernal area, constant, 4 out of 10 in severity. It is pleuritic in nature. She also has sob and cough with yellow colored sputum production. Patient reports that she has crampy pain over left calf area. No recent long distance traveling. Not on OCP. Patient does not have fever, chills, symptoms for UTI. No unilateral weakness.  In ED, patient was found to have negative CT abdomen/pelvis. Lipase 58, WBC 15.6, temperature normal, no tachycardia, potassium 2.8, renal function okay. Urinalysis is dirty catch with many squamous cells. D-dimer negative. Patient is admitted to inpatient for further evaluation and treatment.  Where does patient live?   At home    Can patient participate in ADLs?  Yes    Review of Systems:   General: no fevers, chills, no changes in body weight, has poor appetite, has fatigue HEENT: no blurry vision, hearing changes or sore throat Pulm: has dyspnea, coughing, no wheezing CV: has chest pain, no palpitations Abd: has nausea, vomiting, abdominal pain, no diarrhea, constipation GU: no dysuria, burning on urination, increased urinary  frequency, hematuria  Ext: no leg edema. Has pain over left calf area Neuro: no unilateral weakness, numbness, or tingling, no vision change or hearing loss Skin: no rash MSK: No muscle spasm, no deformity, no limitation of range of movement in spin Heme: No easy bruising.  Travel history: No recent long distant travel.  Allergy: No Known Allergies  Past Medical History  Diagnosis Date  . GERD (gastroesophageal reflux disease)     Past Surgical History  Procedure Laterality Date  . Ankle surgery      Social History:  reports that she has been smoking Cigarettes.  She has been smoking about 0.50 packs per day. She does not have any smokeless tobacco history on file. She reports that she does not drink alcohol or use illicit drugs.  Family History:  Family History  Problem Relation Age of Onset  . Hypertension Mother   . Hypertension Father   . Hypertension Sister   . Diabetes Father   . Heart disease Father      Prior to Admission medications   Medication Sig Start Date End Date Taking? Authorizing Provider  HYDROcodone-acetaminophen (NORCO/VICODIN) 5-325 MG per tablet Take 2 tablets by mouth every 4 (four) hours as needed. 01/22/15   Fransico Meadow, PA-C  omeprazole (PRILOSEC) 40 MG capsule Take 40 mg by mouth daily.    Historical Provider, MD  ondansetron (ZOFRAN ODT) 4 MG disintegrating tablet Take 1 tablet (4 mg total) by mouth every 8 (eight) hours as needed for nausea or vomiting. 01/22/15   Fransico Meadow, PA-C    Physical Exam: Filed Vitals:   01/23/15 2051 01/23/15 2258 01/24/15  0102 01/24/15 0145  BP: 169/106 160/102 117/61 134/87  Pulse: 84 76 66 60  Temp: 98.5 F (36.9 C)  98.5 F (36.9 C) 98.5 F (36.9 C)  TempSrc: Oral  Oral Oral  Resp: 23 18 18 18   Height: 5\' 7"  (1.702 m)   5\' 7"  (1.702 m)  Weight: 96.163 kg (212 lb)   100.109 kg (220 lb 11.2 oz)  SpO2: 98% 98% 98% 100%   General: Not in acute distress HEENT:       Eyes: PERRL, EOMI, no scleral  icterus.       ENT: No discharge from the ears and nose, no pharynx injection, no tonsillar enlargement.        Neck: No JVD, no bruit, no mass felt. Heme: No neck lymph node enlargement. Cardiac: S1/S2, RRR, No murmurs, No gallops or rubs. Pulm: No rales, wheezing, rhonchi or rubs.  Abd: Soft, nondistended, has tenderness over central and epigastric area, no rebound pain, no organomegaly, BS present. Ext: No pitting leg edema bilaterally. 2+DP/PT pulse bilaterally. Has tenderness over left calf area. Musculoskeletal: No joint deformities, No joint redness or warmth, no limitation of ROM in spin. Skin: No rashes.  Neuro: Alert, oriented X3, cranial nerves II-XII grossly intact, muscle strength 5/5 in all extremities, sensation to light touch intact.  Psych: Patient is not psychotic, no suicidal or hemocidal ideation.  Labs on Admission:  Basic Metabolic Panel:  Recent Labs Lab 01/22/15 1835 01/23/15 2115  NA 137 139  K 3.1* 2.8*  CL 100* 101  CO2 25 28  GLUCOSE 129* 115*  BUN 13 10  CREATININE 0.77 0.80  CALCIUM 9.3 8.9   Liver Function Tests:  Recent Labs Lab 01/22/15 1835 01/23/15 2115  AST 32 35  ALT 24 26  ALKPHOS 92 80  BILITOT 0.8 0.5  PROT 8.4* 7.2  ALBUMIN 4.4 4.3    Recent Labs Lab 01/22/15 1835 01/23/15 2115  LIPASE 12* 58*   No results for input(s): AMMONIA in the last 168 hours. CBC:  Recent Labs Lab 01/22/15 1835 01/23/15 2115  WBC 19.6* 15.6*  NEUTROABS 16.3* 8.7*  HGB 16.0* 14.5  HCT 46.6* 43.2  MCV 87.3 88.5  PLT 394 340   Cardiac Enzymes: No results for input(s): CKTOTAL, CKMB, CKMBINDEX, TROPONINI in the last 168 hours.  BNP (last 3 results) No results for input(s): BNP in the last 8760 hours.  ProBNP (last 3 results) No results for input(s): PROBNP in the last 8760 hours.  CBG: No results for input(s): GLUCAP in the last 168 hours.  Radiological Exams on Admission: Ct Abdomen Pelvis W Contrast  01/22/2015   CLINICAL DATA:   Epigastric pain and vomiting, 3 days duration. Gross hematuria is well. Elevated white count.  EXAM: CT ABDOMEN AND PELVIS WITH CONTRAST  TECHNIQUE: Multidetector CT imaging of the abdomen and pelvis was performed using the standard protocol following bolus administration of intravenous contrast.  CONTRAST:  42mL OMNIPAQUE IOHEXOL 300 MG/ML SOLN, 158mL OMNIPAQUE IOHEXOL 300 MG/ML SOLN  COMPARISON:  07/25/2010  FINDINGS: Lung bases are clear. No pleural or pericardial fluid. The liver has normal appearance without focal lesions or biliary ductal dilatation. No calcified gallstones. The spleen is normal. The pancreas is normal. The adrenal glands are normal. The kidneys are normal. No cyst, mass, stone or hydronephrosis. The aorta and IVC are normal. No retroperitoneal mass or adenopathy. No free intraperitoneal fluid or air. No visible bowel pathology. The appendix looks normal. The bladder is normal. The uterus is  retroverted. The ovaries appear slightly prominent but otherwise unremarkable, possibly because of the retroverted position. No free fluid. No significant bony finding.  Compared to the previous study, no change is appreciated except for resolution of enlarged nodes in the right lower quadrant mesenteric previously seen but normal today.  IMPRESSION: No abnormality seen to explain the presenting symptoms.   Electronically Signed   By: Nelson Chimes M.D.   On: 01/22/2015 22:05    EKG: Not done in ED, will get one.   Assessment/Plan Principal Problem:   Abdominal pain Active Problems:   GERD (gastroesophageal reflux disease)   Hypokalemia   Leukocytosis   Chest pain   Cough   Pain of left calf  Nausea, vomiting, abdominal pain: Etiology is not clear. CT abdomen/pelvis negative. Lipase is slightly elevated at 58, possible mild pancreatitis vs viral gastritis. -will admit to tele bed -NPO -Zofran for nausea, morphine for pain -get US-abdomen for further evaluation of biliary system. -FOBT  given "black stool".  Chest pain: Initially suspect PE given patient has eft calf pain, but her d-dimer is negative, ruling out PE. Her trop comes back 0.06, liely cardiac etiology. EKG has no ischemic change. Card was consuled - cycle CE q6 x3  - Nitroglycerin, Morphine, and aspirin, lipitor  - Risk factor stratification: will check FLP and A1C  - follow up Card Recs - 2d echo - CXR  GERD: -Protonix  Hypokalemia: K=  2.8 on admission. - Repleted - Check Mg level  Leukocytosis: likely related to nausea, vomiting. Patient is not septic on admission. -follow up by CBC  Pain of left calf - LE doppler to r/o DVT  Tobacco abuse and Alcohol abuse: -Did counseling about importance of quitting smoking -Nicotine patch   DVT ppx: SQ Heparin      Code Status: Full code Family Communication:   Yes, patient's boyfriend  at bed side Disposition Plan: Admit to inpatient   Date of Service 01/24/2015    Ivor Costa [facility] Pager [phone]  If 7PM-7AM, please contact night-coverage [email or web address] Password [email or web address] 01/24/2015, 3:13 AM

## 2015-01-24 NOTE — Consult Note (Signed)
Cardiology Consultation Note  Patient ID: Jasmine Mason, MRN: [id number], DOB/AGE: February 02, 1986 29 y.o. Admit date: 01/23/2015   Date of Consult: 01/24/2015 Primary Physician: No primary care provider on file. Primary Cardiologist: New  Chief Complaint: abdominal pain, vomiting, black stool, chest pain Reason for Consultation: chest pain, troponin 0.06  HPI: [name] is a 29 y/o female with no prior cardiac history but a history of GERD, obesity, 10 years of tobacco abuse, and mesenteric adenitis in 2012 who presented to [facility] with several day history of nausea, vomiting and abdominal pain.   She has been throwing up since Thursday 01/20/15 with upper central/epigstric abdominal pain radiating to her back. She has not been able to keep food down. She was seen in the ER on 7/2 for abdominal pain at which time CT abdomen/pelvis showed no abnormality to explain the presenting symptoms. She was supposed to come back for abdominal US but felt too bad to do so. Yesterday she says she threw up "blood clots" and also had one dark tarry stool which has never happened before. She also reports that yesterday afternoon she developed central chest pain, 8/10 and constant which prompted her to return to the ER. It seemed to be worse after she threw up, like a burning sensation. It was also worse when she breathed in cold air. Pain gradually eased off with time - she has been given morphine and phenergan periodically and these helped along the way. She said the pain has felt like "acid." It is not worse with exertion or inspiration. She does not exercise but but denies any recent exertional limitations. She also reports recent wheezing. She has had a cough with yellow sputum. She reports low grade intermittent fevers up to 100 degrees Fahrenheit. No recent chills, long distance traveling, LEE, orthopnea, PND. Labwork notable for initial WBC 19k, Hgb 16, K 3.1 (down to 2.8 at one point), glucose 129, Lipase  12-> 58. UA contaminated specimen with many sq epithelials, large Hgb, negative urine pregnancy. LDL 95. Strep pneumo negative. CXR pending. D-dimer negative. Troponin drawn overnight was 0.06. She says CP has eased off for now. Initial BPs were high (169/106) but she is now normotensive. Not tachycardic, tachypneic or hypoxic. She also received IV fluids. Has smoked for 10 years; denies EtOH or illicits.  Past Medical History  Diagnosis Date  . GERD (gastroesophageal reflux disease)   . Tobacco abuse   . Mesenteric adenitis   . Obesity       Most Recent Cardiac Studies: None   Surgical History:  Past Surgical History  Procedure Laterality Date  . Ankle surgery       Home Meds: Prior to Admission medications   Medication Sig Start Date End Date Taking? Authorizing Provider  omeprazole (PRILOSEC OTC) 20 MG tablet Take 20 mg by mouth daily. 09/27/14  Yes Historical Provider, MD  HYDROcodone-acetaminophen (NORCO/VICODIN) 5-325 MG per tablet Take 2 tablets by mouth every 4 (four) hours as needed. Patient not taking: Reported on 01/24/2015 01/22/15   Fransico Meadow, PA-C  ondansetron (ZOFRAN ODT) 4 MG disintegrating tablet Take 1 tablet (4 mg total) by mouth every 8 (eight) hours as needed for nausea or vomiting. Patient not taking: Reported on 01/24/2015 01/22/15   Fransico Meadow, PA-C    Inpatient Medications:  . sodium chloride   Intravenous STAT  . aspirin  325 mg Oral Daily  . atorvastatin  40 mg Oral q1800  . dextromethorphan-guaiFENesin  1 tablet Oral BID  .  heparin  5,000 Units Subcutaneous 3 times per day  . nicotine  21 mg Transdermal Daily  . pantoprazole (PROTONIX) IV  40 mg Intravenous Q24H  . sodium chloride  3 mL Intravenous Q12H      Allergies: No Known Allergies  History   Social History  . Marital Status: Married    Spouse Name: N/A  . Number of Children: N/A  . Years of Education: N/A   Occupational History  . Not on file.   Social History Main Topics  .  Smoking status: Current Every Day Smoker -- 0.50 packs/day    Types: Cigarettes  . Smokeless tobacco: Not on file  . Alcohol Use: No  . Drug Use: No  . Sexual Activity: Yes   Other Topics Concern  . Not on file   Social History Narrative     Family History  Problem Relation Age of Onset  . Hypertension Mother   . Hypertension Father   . Hypertension Sister   . Diabetes Father   . Heart disease Father      Review of Systems: No recent syncope, palpitations, LEE, lower extremity erythema. All other systems reviewed and are otherwise negative except as noted above.  Labs:  Recent Labs  01/24/15 0352  TROPONINI 0.06*   Lab Results  Component Value Date   WBC 13.8* 01/24/2015   HGB 13.4 01/24/2015   HCT 39.9 01/24/2015   MCV 89.3 01/24/2015   PLT 296 01/24/2015    Recent Labs Lab 01/24/15 0352  NA 140  K 3.4*  CL 103  CO2 28  BUN <5*  CREATININE 0.77  CALCIUM 8.2*  PROT 6.5  BILITOT 0.8  ALKPHOS 69  ALT 23  AST 29  GLUCOSE 103*   Lab Results  Component Value Date   CHOL 146 01/24/2015   HDL 36* 01/24/2015   LDLCALC 95 01/24/2015   TRIG 73 01/24/2015   Lab Results  Component Value Date   DDIMER 0.48 01/24/2015    Radiology/Studies:  Ct Abdomen Pelvis W Contrast  01/22/2015   CLINICAL DATA:  Epigastric pain and vomiting, 3 days duration. Gross hematuria is well. Elevated white count.  EXAM: CT ABDOMEN AND PELVIS WITH CONTRAST  TECHNIQUE: Multidetector CT imaging of the abdomen and pelvis was performed using the standard protocol following bolus administration of intravenous contrast.  CONTRAST:  48mL OMNIPAQUE IOHEXOL 300 MG/ML SOLN, 165mL OMNIPAQUE IOHEXOL 300 MG/ML SOLN  COMPARISON:  07/25/2010  FINDINGS: Lung bases are clear. No pleural or pericardial fluid. The liver has normal appearance without focal lesions or biliary ductal dilatation. No calcified gallstones. The spleen is normal. The pancreas is normal. The adrenal glands are normal. The  kidneys are normal. No cyst, mass, stone or hydronephrosis. The aorta and IVC are normal. No retroperitoneal mass or adenopathy. No free intraperitoneal fluid or air. No visible bowel pathology. The appendix looks normal. The bladder is normal. The uterus is retroverted. The ovaries appear slightly prominent but otherwise unremarkable, possibly because of the retroverted position. No free fluid. No significant bony finding.  Compared to the previous study, no change is appreciated except for resolution of enlarged nodes in the right lower quadrant mesenteric previously seen but normal today.  IMPRESSION: No abnormality seen to explain the presenting symptoms.   Electronically Signed   By: Nelson Chimes M.D.   On: 01/22/2015 22:05    Wt Readings from Last 3 Encounters:  01/24/15 220 lb 11.2 oz (100.109 kg)  01/22/15 212 lb (96.163 kg)  EKG: NSR 70bpm, nonspecific early ST upsloping throughout EKG without acute changes.  Physical Exam: Blood pressure 117/75, pulse 73, temperature 98.1 F (36.7 C), temperature source Oral, resp. rate 18, height 5\' 7"  (1.702 m), weight 220 lb 11.2 oz (100.109 kg), last menstrual period 01/19/2015, SpO2 100 %. General: Well developed obese AAF in no acute distress. Head: Normocephalic, atraumatic, sclera non-icteric, no xanthomas, nares are without discharge.  Neck: Negative for carotid bruits. JVD not elevated. Lungs: Clear bilaterally to auscultation without wheezes, rales, or rhonchi. Breathing is unlabored. Heart: RRR with S1 S2. No murmurs, rubs, or gallops appreciated. Abdomen: Soft, non-tender, non-distended with normoactive bowel sounds. No hepatomegaly. No rebound/guarding. No obvious abdominal masses. Msk:  Strength and tone appear normal for age. Extremities: No clubbing or cyanosis. No edema.  Distal pedal pulses are 2+ and equal bilaterally. Neuro: Alert and oriented X 3. No facial asymmetry. No focal deficit. Moves all extremities spontaneously. Psych:   Responds to questions appropriately with a normal affect.    Assessment and Plan:   1. Abdominal pain, nausea, vomiting, dark tarry stool and one episode of hematochezia 2. Leukocytosis 3. Mildly elevated troponin in the setting of chest pain 4. Hypokalemia - being repleted by IM 5. H/o GERD 6. H/o tobacco abuse, counseled regarding cessation (not convinced she has any interest in quitting)  Symptoms sound more concerning for GI pathology. Chest pain seems overall atypical, however, first troponin was mildly abnormal. Not yet clear if this was elevated due to the setting of physiologic stress - f/u levels ordered. Does not really fit pericarditis picture. Continue to cycle troponins. Decrease ASA to 81mg  daily. Await CXR and 2D echo report. Will discuss further evaluation with MD.   Rudean Hitt, Dunn PA-C 01/24/2015, 8:21 AM Pager: [phone]  Patient seen and discussed with PA Dunn, I agree with her documentation above. 29 yo female history of GERD admitted with multiple symptoms including nausea, frequent vomiting, adbominal pain/chest pain, cough w/ yellow sputum/SOB, and left calf pain. She also has noted some dark tarry stools.   Lipase 12, K 3.1, Cr 0.77, WBC 19.6, Plt 394, urine preg neg, D-dimer neg (right at 0.48), trop 0.06 CT A/P no acute process Abd [place] pending CXR pending EKG NSR Echo pending LE Venous US pending  Clinical picture not overall consistent with primary cardiac etiology of her chest pain. Atypical primarily GI sounding symptoms in patient with minimal CAD risk factors. Her multiple systemic symptoms would suggest a infectious likely viral process since it involves multiple organ systems. Mild trop 0.06 just above level of detection overall non-specific, would follow trend and echo.    Zandra Abts MD

## 2015-01-24 NOTE — Progress Notes (Signed)
VASCULAR LAB PRELIMINARY  PRELIMINARY  PRELIMINARY  PRELIMINARY  Left lower extremity venous duplex completed.    Preliminary report:  Left:  No evidence of DVT, superficial thrombosis, or Baker's cyst.  Jasmine Mason, RVS 01/24/2015, 9:01 AM

## 2015-01-24 NOTE — Progress Notes (Signed)
PROGRESS NOTE    Jasmine Mason UKG:[id number] DOB: Feb 11, 1986 DOA: 01/23/2015 PCP: No primary care provider on file.  HPI/Brief narrative 29 year old female patient with history of GERD, ongoing tobacco abuse, presented with several episodes of intractable nausea, vomiting and abdominal pain since Thursday of last week. Emesis mostly non-bloody except had 1 episode of small amount of blood and subsequent flecks of black in emesis. Black tarry stools 1 but no diarrhea. Pain is epigastric/retrosternal and nonradiating, worse with emesis. No cyclic contacts or eating anything unusual. Denies NSAID use or alcohol intake. She started having chest pain day prior to admission, substernal, 4/10 in severity, worse with deep breaths or dry heaves. Denies dyspnea. In the ED, CT abdomen and pelvis negative for acute findings, lipase 58, WBC 15.6, potassium 2.8, urine analysis-dirty catch with many squamous cells, d-dimer negative. Mild solitary elevation of troponin. Cardiology and GI consulted.   Assessment/Plan:  Nausea, vomiting & abdominal pain/mild upper GI bleed - DD: Acute viral GE, gastritis, esophagitis, MW tear, PUD - Treated supportively with bowel rest, IV fluids, IV PPI and antiemetics. - CT abdomen and pelvis: No acute findings. Abdominal ultrasound: Normal exam - Clinically improved with no further emesis or abdominal pain since 7/3. - GI/Dr. Benson Norway consulted for further evaluation.? EGD. - Hemoglobin stable despite reported GI bleed suggesting minimal bleed. Follow CBC in a.m.  Atypical chest pain - May be GI in origin. - Initial troponin 0.06 but subsequent troponin negative. EKG without acute findings. - D-dimer negative. - Left lower extremity venous Doppler: No evidence of DVT - Chest x-ray negative. - 2-D echo: Normal - Cardiology input appreciated  Hypokalemia - Improved. Replace and follow. - Magnesium normal.  Left calf pain - Negative venous Doppler  Tobacco abuse -  Cessation counseled. Nicotine patch - Denies alcohol abuse  Leukocytosis - Likely stress response. Improving   DVT prophylaxis: SCDs Code Status: Full Family Communication: Discussed with boyfriend at bedside Disposition Plan: DC home when medically stable   Consultants:  Cardiology  GI  Procedures:  2-D echo 01/24/15: Study Conclusions  - Left ventricle: The cavity size was normal. Wall thickness was normal. The estimated ejection fraction was 60%. Wall motion was normal; there were no regional wall motion abnormalities. - Right ventricle: The cavity size was normal. Systolic function was normal.  Left lower extremity venous duplex completed. 01/24/15  Preliminary report: Left: No evidence of DVT, superficial thrombosis, or Baker's cyst.  Antibiotics:  None  Subjective: Feels much better compared to admission. Denies nausea, vomiting, abdominal pain or chest pain.  Objective: Filed Vitals:   01/24/15 0102 01/24/15 0145 01/24/15 0307 01/24/15 0633  BP: 117/61 134/87  117/75  Pulse: 66 60  73  Temp: 98.5 F (36.9 C) 98.5 F (36.9 C)  98.1 F (36.7 C)  TempSrc: Oral Oral  Oral  Resp: 18 18  18   Height:  5\' 7"  (1.702 m)    Weight:  100.109 kg (220 lb 11.2 oz)    SpO2: 98% 100% 100% 100%    Intake/Output Summary (Last 24 hours) at 01/24/15 1409 Last data filed at 01/24/15 1348  Gross per 24 hour  Intake    200 ml  Output    925 ml  Net   -725 ml   Filed Weights   01/23/15 2051 01/24/15 0145  Weight: 96.163 kg (212 lb) 100.109 kg (220 lb 11.2 oz)     Exam:  General exam: Moderately built and overweight pleasant young female lying comfortably in bed.  Respiratory system: Clear. No increased work of breathing. Cardiovascular system: S1 & S2 heard, RRR. No JVD, murmurs, gallops, clicks or pedal edema. Telemetry: Sinus rhythm. Gastrointestinal system: Abdomen is nondistended, soft and nontender. Normal bowel sounds heard. Central nervous system:  Alert and oriented. No focal neurological deficits. Extremities: Symmetric 5 x 5 power.   Data Reviewed: Basic Metabolic Panel:  Recent Labs Lab 01/22/15 1835 01/23/15 2115 01/24/15 0352  NA 137 139 140  K 3.1* 2.8* 3.4*  CL 100* 101 103  CO2 25 28 28   GLUCOSE 129* 115* 103*  BUN 13 10 <5*  CREATININE 0.77 0.80 0.77  CALCIUM 9.3 8.9 8.2*  MG  --   --  1.7   Liver Function Tests:  Recent Labs Lab 01/22/15 1835 01/23/15 2115 01/24/15 0352  AST 32 35 29  ALT 24 26 23   ALKPHOS 92 80 69  BILITOT 0.8 0.5 0.8  PROT 8.4* 7.2 6.5  ALBUMIN 4.4 4.3 3.6    Recent Labs Lab 01/22/15 1835 01/23/15 2115  LIPASE 12* 58*   No results for input(s): AMMONIA in the last 168 hours. CBC:  Recent Labs Lab 01/22/15 1835 01/23/15 2115 01/24/15 0352  WBC 19.6* 15.6* 13.8*  NEUTROABS 16.3* 8.7*  --   HGB 16.0* 14.5 13.4  HCT 46.6* 43.2 39.9  MCV 87.3 88.5 89.3  PLT 394 340 296   Cardiac Enzymes:  Recent Labs Lab 01/24/15 0352 01/24/15 0955  TROPONINI 0.06* <0.03   BNP (last 3 results) No results for input(s): PROBNP in the last 8760 hours. CBG:  Recent Labs Lab 01/24/15 0651 01/24/15 1141  GLUCAP 88 82    No results found for this or any previous visit (from the past 240 hour(s)).       Studies: Dg Chest 2 View  01/24/2015   CLINICAL DATA:  Vomiting. Abdominal pain, chest pain, shortness of breath since Thursday.  EXAM: CHEST  2 VIEW  COMPARISON:  None.  FINDINGS: The heart size and mediastinal contours are within normal limits. Both lungs are clear. The visualized skeletal structures are unremarkable.  IMPRESSION: No active cardiopulmonary disease.   Electronically Signed   By: Rolm Baptise M.D.   On: 01/24/2015 09:28   US Abdomen Complete  01/24/2015   CLINICAL DATA:  Abdominal pain for 4 days.  Elevated LFTs.  EXAM: ULTRASOUND ABDOMEN COMPLETE  COMPARISON:  01/22/2015  FINDINGS: Gallbladder: No gallstones or wall thickening visualized. No sonographic  Murphy sign noted.  Common bile duct: Diameter: 0.2 cm.  Liver: No focal lesion identified. Within normal limits in parenchymal echogenicity.  IVC: No abnormality visualized.  Pancreas: Visualized portion unremarkable.  Spleen: Size and appearance within normal limits.  Right Kidney: Length: 11.6 cm. Echogenicity within normal limits. No mass or hydronephrosis visualized.  Left Kidney: Length: 11.2 cm. Echogenicity within normal limits. No mass or hydronephrosis visualized.  Abdominal aorta: No aneurysm visualized.  Other findings: None.  IMPRESSION: Normal abdominal ultrasound.   Electronically Signed   By: Markus Daft M.D.   On: 01/24/2015 09:50   Ct Abdomen Pelvis W Contrast  01/22/2015   CLINICAL DATA:  Epigastric pain and vomiting, 3 days duration. Gross hematuria is well. Elevated white count.  EXAM: CT ABDOMEN AND PELVIS WITH CONTRAST  TECHNIQUE: Multidetector CT imaging of the abdomen and pelvis was performed using the standard protocol following bolus administration of intravenous contrast.  CONTRAST:  9mL OMNIPAQUE IOHEXOL 300 MG/ML SOLN, 143mL OMNIPAQUE IOHEXOL 300 MG/ML SOLN  COMPARISON:  07/25/2010  FINDINGS:  Lung bases are clear. No pleural or pericardial fluid. The liver has normal appearance without focal lesions or biliary ductal dilatation. No calcified gallstones. The spleen is normal. The pancreas is normal. The adrenal glands are normal. The kidneys are normal. No cyst, mass, stone or hydronephrosis. The aorta and IVC are normal. No retroperitoneal mass or adenopathy. No free intraperitoneal fluid or air. No visible bowel pathology. The appendix looks normal. The bladder is normal. The uterus is retroverted. The ovaries appear slightly prominent but otherwise unremarkable, possibly because of the retroverted position. No free fluid. No significant bony finding.  Compared to the previous study, no change is appreciated except for resolution of enlarged nodes in the right lower quadrant  mesenteric previously seen but normal today.  IMPRESSION: No abnormality seen to explain the presenting symptoms.   Electronically Signed   By: Nelson Chimes M.D.   On: 01/22/2015 22:05        Scheduled Meds: . sodium chloride   Intravenous STAT  . [START ON 01/25/2015] aspirin EC  81 mg Oral Daily  . atorvastatin  40 mg Oral q1800  . dextromethorphan-guaiFENesin  1 tablet Oral BID  . heparin  5,000 Units Subcutaneous 3 times per day  . nicotine  21 mg Transdermal Daily  . pantoprazole (PROTONIX) IV  40 mg Intravenous Q24H  . sodium chloride  3 mL Intravenous Q12H   Continuous Infusions:   Principal Problem:   Abdominal pain Active Problems:   GERD (gastroesophageal reflux disease)   Hypokalemia   Leukocytosis   Chest pain   Cough   Pain of left calf   Obesity   Elevated troponin    Time spent: 58 minutes    Bradon Fester, MD, FACP, FHM. [facility] Pager [phone]  If 7PM-7AM, please contact night-coverage [email or web address] Password [email or web address] 01/24/2015, 2:09 PM    LOS: 1 day

## 2015-01-25 ENCOUNTER — Encounter (HOSPITAL_COMMUNITY)
Admission: EM | Disposition: A | Discharge status: Home / Self Care | Payer: Self-pay | Source: Home / Self Care | Attending: Internal Medicine

## 2015-01-25 ENCOUNTER — Encounter (HOSPITAL_COMMUNITY): Payer: Self-pay | Admitting: Internal Medicine

## 2015-01-25 DIAGNOSIS — K922 Gastrointestinal hemorrhage, unspecified: Secondary | ICD-10-CM

## 2015-01-25 DIAGNOSIS — R072 Precordial pain: Secondary | ICD-10-CM

## 2015-01-25 DIAGNOSIS — Z720 Tobacco use: Secondary | ICD-10-CM

## 2015-01-25 DIAGNOSIS — R1013 Epigastric pain: Secondary | ICD-10-CM | POA: Insufficient documentation

## 2015-01-25 DIAGNOSIS — F1210 Cannabis abuse, uncomplicated: Secondary | ICD-10-CM

## 2015-01-25 DIAGNOSIS — R0789 Other chest pain: Secondary | ICD-10-CM

## 2015-01-25 HISTORY — PX: ESOPHAGOGASTRODUODENOSCOPY: SHX5428

## 2015-01-25 LAB — BASIC METABOLIC PANEL
Anion gap: 8 (ref 5–15)
BUN: <5 mg/dL — ABNORMAL LOW (ref 6–20)
CO2: 24 mmol/L (ref 22–32)
Calcium: 8.5 mg/dL — ABNORMAL LOW (ref 8.9–10.3)
Chloride: 108 mmol/L (ref 101–111)
Creatinine, Ser: 0.68 mg/dL (ref 0.44–1.00)
Glucose, Bld: 92 mg/dL (ref 65–99)
POTASSIUM: 3.8 mmol/L (ref 3.5–5.1)
Sodium: 140 mmol/L (ref 135–145)

## 2015-01-25 LAB — CBC
HCT: 39.1 % (ref 36.0–46.0)
HEMOGLOBIN: 13.4 g/dL (ref 12.0–15.0)
MCH: 29.7 pg (ref 26.0–34.0)
MCHC: 34.3 g/dL (ref 30.0–36.0)
MCV: 86.7 fL (ref 78.0–100.0)
Platelets: 157 10*3/uL (ref 150–400)
RBC: 4.51 MIL/uL (ref 3.87–5.11)
RDW: 12.8 % (ref 11.5–15.5)
WBC: 8.2 10*3/uL (ref 4.0–10.5)

## 2015-01-25 LAB — HEMOGLOBIN A1C
Hgb A1c MFr Bld: 5.5 % (ref 4.8–5.6)
Mean Plasma Glucose: 111 mg/dL

## 2015-01-25 LAB — LEGIONELLA ANTIGEN, URINE

## 2015-01-25 LAB — GLUCOSE, CAPILLARY: Glucose-Capillary: 79 mg/dL (ref 65–99)

## 2015-01-25 SURGERY — EGD (ESOPHAGOGASTRODUODENOSCOPY)
Anesthesia: Moderate Sedation

## 2015-01-25 MED ORDER — FENTANYL CITRATE (PF) 100 MCG/2ML IJ SOLN
INTRAMUSCULAR | Status: AC
  Filled 2015-01-25: qty 2

## 2015-01-25 MED ORDER — MIDAZOLAM HCL 5 MG/ML IJ SOLN
INTRAMUSCULAR | Status: AC
  Filled 2015-01-25: qty 2

## 2015-01-25 MED ORDER — PANTOPRAZOLE SODIUM 40 MG PO TBEC: 40.0000 mg | DELAYED_RELEASE_TABLET | Freq: Two times a day (BID) | ORAL | Status: DC

## 2015-01-25 MED ORDER — MIDAZOLAM HCL 10 MG/2ML IJ SOLN
INTRAMUSCULAR | Status: DC | PRN
  Administered 2015-01-25 (×4): 2 mg via INTRAVENOUS

## 2015-01-25 MED ORDER — BUTAMBEN-TETRACAINE-BENZOCAINE 2-2-14 % EX AERO
INHALATION_SPRAY | CUTANEOUS | Status: DC | PRN
Start: 2015-01-25 — End: 2015-01-25
  Administered 2015-01-25: 2 via TOPICAL

## 2015-01-25 MED ORDER — FENTANYL CITRATE (PF) 100 MCG/2ML IJ SOLN
INTRAMUSCULAR | Status: DC | PRN
  Administered 2015-01-25 (×3): 25 ug via INTRAVENOUS

## 2015-01-25 NOTE — Op Note (Signed)
[facility] [place], [id number]   ENDOSCOPY PROCEDURE REPORT  PATIENT: Jasmine Mason, Jasmine Mason  MR#: [id number] BIRTHDATE: 01-23-1986 , 29  yrs. old GENDER: female ENDOSCOPIST: Eustace Quail, MD REFERRED BY:  [facility] PROCEDURE DATE:  01/25/2015 PROCEDURE:  EGD, diagnostic ASA CLASS:     Class II INDICATIONS:  epigastric pain. Non-witnessed reports of hematemesis and melena. MEDICATIONS: Fentanyl 75 mcg IV and Versed 8 mg IV TOPICAL ANESTHETIC: Cetacaine Spray  DESCRIPTION OF PROCEDURE: After the risks benefits and alternatives of the procedure were thoroughly explained, informed consent was obtained.  The [id number] ([id number]) endoscope was introduced through the mouth and advanced to the second portion of the duodenum , Without limitations.  The instrument was slowly withdrawn as the mucosa was fully examined.  EXAM: The esophagus and gastroesophageal junction were completely normal in appearance.  The stomach was entered and closely examined.The antrum, angularis, and lesser curvature were well visualized, including a retroflexed view of the cardia and fundus. The stomach wall was normally distensable.  The scope passed easily through the pylorus into the duodenum.  Retroflexed views revealed a hiatal hernia.     The scope was then withdrawn from the patient and the procedure completed.  COMPLICATIONS: There were no immediate complications.  ENDOSCOPIC IMPRESSION: 1. Normal EGD 2. GERD  RECOMMENDATIONS: 1.  Anti-reflux regimen to be followed ) particular attention to weight loss) 2.  Convert PPI  to oral pantoprazole 40 mg twice daily for 2 weeks then 40 mg once daily indefinitely 3. Antiemetics as needed 4. Advance diet as tolerated 5. Would discontinue narcotics 6. No further GI workup planned. GI follow-up not necessary. She should find a PCP. Will sign off.   Thank you  REPEAT EXAM:  eSigned:  Eustace Quail, MD  01/25/2015 10:40 AM    CC:The Patient

## 2015-01-25 NOTE — Interval H&P Note (Signed)
History and Physical Interval Note:  01/25/2015 10:15 AM  [name]  has presented today for surgery, with the diagnosis of Epigastric pain and hematemesis  The various methods of treatment have been discussed with the patient and family. After consideration of risks, benefits and other options for treatment, the patient has consented to  Procedure(s): ESOPHAGOGASTRODUODENOSCOPY (EGD) (N/A) as a surgical intervention .  The patient's history has been reviewed, patient examined, no change in status, stable for surgery.  I have reviewed the patient's chart and labs.  Questions were answered to the patient's satisfaction.     Jasmine Mason

## 2015-01-25 NOTE — H&P (View-Only) (Signed)
Reason for Consult: ABM pain, nausea, and vomiting Referring Physician: [facility]  Jasmine Mason HPI: This is a 29 year old female with an acute onset of epigastric abdominal pain, nausea, vomiting, chest pain and SOB.  She reports that her pain is in the epigastric region with radiation to her back.  In the ER her work up with the CT scan was negative for any abdominal pathology and her lipase was at58, however, her WBC was at 15.6.  On the day of admission she suffered from nausea and vomiting and she estimates that she vomited 10 times.  With the vomiting she felt that she vomited clots.  After the vomiting she reports a burning sensation in the epigastric region.  Cardiology evaluate the patient and there is no cardiac source.  The patient has a history of GERD and she also mentioned vomiting some clots and she had one melenic stool.  There is no anemia and she is also negative for her urine pregnancy test.  Past Medical History  Diagnosis Date  . GERD (gastroesophageal reflux disease)   . Tobacco abuse   . Mesenteric adenitis   . Obesity     Past Surgical History  Procedure Laterality Date  . Orif ankle fracture Left 1990's  . Carpal tunnel release Left ~ 2013  . Fracture surgery      Family History  Problem Relation Age of Onset  . Hypertension Mother   . Hypertension Father   . Hypertension Sister   . Diabetes Father   . Heart disease Father     Social History:  reports that she has been smoking Cigarettes.  She has a 2.5 pack-year smoking history. She does not have any smokeless tobacco history on file. She reports that she does not drink alcohol or use illicit drugs.  Allergies: No Known Allergies  Medications:  Scheduled: . [START ON 01/25/2015] aspirin EC  81 mg Oral Daily  . atorvastatin  40 mg Oral q1800  . dextromethorphan-guaiFENesin  1 tablet Oral BID  . nicotine  21 mg Transdermal Daily  . pantoprazole (PROTONIX) IV  40 mg Intravenous Q24H  . sodium  chloride  3 mL Intravenous Q12H   Continuous: . 0.9 % NaCl with KCl 40 mEq / L 75 mL/hr (01/24/15 1535)    Results for orders placed or performed during the hospital encounter of 01/23/15 (from the past 24 hour(s))  Comprehensive metabolic panel     Status: Abnormal   Collection Time: 01/23/15  9:15 PM  Result Value Ref Range   Sodium 139 135 - 145 mmol/L   Potassium 2.8 (L) 3.5 - 5.1 mmol/L   Chloride 101 101 - 111 mmol/L   CO2 28 22 - 32 mmol/L   Glucose, Bld 115 (H) 65 - 99 mg/dL   BUN 10 6 - 20 mg/dL   Creatinine, Ser 0.80 0.44 - 1.00 mg/dL   Calcium 8.9 8.9 - 10.3 mg/dL   Total Protein 7.2 6.5 - 8.1 g/dL   Albumin 4.3 3.5 - 5.0 g/dL   AST 35 15 - 41 U/L   ALT 26 14 - 54 U/L   Alkaline Phosphatase 80 38 - 126 U/L   Total Bilirubin 0.5 0.3 - 1.2 mg/dL   GFR calc non Af Amer >60 >60 mL/min   GFR calc Af Amer >60 >60 mL/min   Anion gap 10 5 - 15  Lipase, blood     Status: Abnormal   Collection Time: 01/23/15  9:15 PM  Result Value  Ref Range   Lipase 58 (H) 22 - 51 U/L  CBC with Differential     Status: Abnormal   Collection Time: 01/23/15  9:15 PM  Result Value Ref Range   WBC 15.6 (H) 4.0 - 10.5 K/uL   RBC 4.88 3.87 - 5.11 MIL/uL   Hemoglobin 14.5 12.0 - 15.0 g/dL   HCT 43.2 36.0 - 46.0 %   MCV 88.5 78.0 - 100.0 fL   MCH 29.7 26.0 - 34.0 pg   MCHC 33.6 30.0 - 36.0 g/dL   RDW 12.9 11.5 - 15.5 %   Platelets 340 150 - 400 K/uL   Neutrophils Relative % 55 43 - 77 %   Neutro Abs 8.7 (H) 1.7 - 7.7 K/uL   Lymphocytes Relative 36 12 - 46 %   Lymphs Abs 5.5 (H) 0.7 - 4.0 K/uL   Monocytes Relative 8 3 - 12 %   Monocytes Absolute 1.2 (H) 0.1 - 1.0 K/uL   Eosinophils Relative 1 0 - 5 %   Eosinophils Absolute 0.1 0.0 - 0.7 K/uL   Basophils Relative 0 0 - 1 %   Basophils Absolute 0.0 0.0 - 0.1 K/uL  Urinalysis, Routine w reflex microscopic (not at [facility])     Status: Abnormal   Collection Time: 01/23/15 10:35 PM  Result Value Ref Range   Color, Urine YELLOW YELLOW    APPearance CLOUDY (A) CLEAR   Specific Gravity, Urine 1.015 1.005 - 1.030   pH 7.0 5.0 - 8.0   Glucose, UA NEGATIVE NEGATIVE mg/dL   Hgb urine dipstick LARGE (A) NEGATIVE   Bilirubin Urine NEGATIVE NEGATIVE   Ketones, ur NEGATIVE NEGATIVE mg/dL   Protein, ur NEGATIVE NEGATIVE mg/dL   Urobilinogen, UA 0.2 0.0 - 1.0 mg/dL   Nitrite NEGATIVE NEGATIVE   Leukocytes, UA NEGATIVE NEGATIVE  Urine microscopic-add on     Status: Abnormal   Collection Time: 01/23/15 10:35 PM  Result Value Ref Range   Squamous Epithelial / LPF MANY (A) RARE   WBC, UA 3-6 <3 WBC/hpf   RBC / HPF 3-6 <3 RBC/hpf   Bacteria, UA MANY (A) RARE  Troponin I (q 6hr x 3)     Status: Abnormal   Collection Time: 01/24/15  3:52 AM  Result Value Ref Range   Troponin I 0.06 (H) <0.031 ng/mL  D-dimer, quantitative (not at [facility])     Status: None   Collection Time: 01/24/15  3:52 AM  Result Value Ref Range   D-Dimer, Quant 0.48 0.00 - 0.48 ug/mL-FEU  Lipid panel     Status: Abnormal   Collection Time: 01/24/15  3:52 AM  Result Value Ref Range   Cholesterol 146 0 - 200 mg/dL   Triglycerides 73 <150 mg/dL   HDL 36 (L) >40 mg/dL   Total CHOL/HDL Ratio 4.1 RATIO   VLDL 15 0 - 40 mg/dL   LDL Cholesterol 95 0 - 99 mg/dL  Magnesium     Status: None   Collection Time: 01/24/15  3:52 AM  Result Value Ref Range   Magnesium 1.7 1.7 - 2.4 mg/dL  HIV antibody     Status: None   Collection Time: 01/24/15  3:52 AM  Result Value Ref Range   HIV Screen 4th Generation wRfx Non Reactive Non Reactive  Comprehensive metabolic panel     Status: Abnormal   Collection Time: 01/24/15  3:52 AM  Result Value Ref Range   Sodium 140 135 - 145 mmol/L   Potassium 3.4 (L) 3.5 -  5.1 mmol/L   Chloride 103 101 - 111 mmol/L   CO2 28 22 - 32 mmol/L   Glucose, Bld 103 (H) 65 - 99 mg/dL   BUN <5 (L) 6 - 20 mg/dL   Creatinine, Ser 0.77 0.44 - 1.00 mg/dL   Calcium 8.2 (L) 8.9 - 10.3 mg/dL   Total Protein 6.5 6.5 - 8.1 g/dL   Albumin 3.6 3.5 - 5.0  g/dL   AST 29 15 - 41 U/L   ALT 23 14 - 54 U/L   Alkaline Phosphatase 69 38 - 126 U/L   Total Bilirubin 0.8 0.3 - 1.2 mg/dL   GFR calc non Af Amer >60 >60 mL/min   GFR calc Af Amer >60 >60 mL/min   Anion gap 9 5 - 15  CBC     Status: Abnormal   Collection Time: 01/24/15  3:52 AM  Result Value Ref Range   WBC 13.8 (H) 4.0 - 10.5 K/uL   RBC 4.47 3.87 - 5.11 MIL/uL   Hemoglobin 13.4 12.0 - 15.0 g/dL   HCT 39.9 36.0 - 46.0 %   MCV 89.3 78.0 - 100.0 fL   MCH 30.0 26.0 - 34.0 pg   MCHC 33.6 30.0 - 36.0 g/dL   RDW 13.0 11.5 - 15.5 %   Platelets 296 150 - 400 K/uL  Protime-INR     Status: None   Collection Time: 01/24/15  3:52 AM  Result Value Ref Range   Prothrombin Time 14.7 11.6 - 15.2 seconds   INR 1.13 0.00 - 1.49  APTT     Status: None   Collection Time: 01/24/15  3:52 AM  Result Value Ref Range   aPTT 30 24 - 37 seconds  Strep pneumoniae urinary antigen     Status: None   Collection Time: 01/24/15  4:41 AM  Result Value Ref Range   Strep Pneumo Urinary Antigen NEGATIVE NEGATIVE  Glucose, capillary     Status: None   Collection Time: 01/24/15  6:51 AM  Result Value Ref Range   Glucose-Capillary 88 65 - 99 mg/dL   Comment 1 Notify RN    Comment 2 Document in Chart   Troponin I (q 6hr x 3)     Status: None   Collection Time: 01/24/15  9:55 AM  Result Value Ref Range   Troponin I <0.03 <0.031 ng/mL  Glucose, capillary     Status: None   Collection Time: 01/24/15 11:41 AM  Result Value Ref Range   Glucose-Capillary 82 65 - 99 mg/dL  Troponin I (q 6hr x 3)     Status: None   Collection Time: 01/24/15  4:10 PM  Result Value Ref Range   Troponin I <0.03 <0.031 ng/mL  Glucose, capillary     Status: None   Collection Time: 01/24/15  4:37 PM  Result Value Ref Range   Glucose-Capillary 76 65 - 99 mg/dL     Dg Chest 2 View  01/24/2015   CLINICAL DATA:  Vomiting. Abdominal pain, chest pain, shortness of breath since Thursday.  EXAM: CHEST  2 VIEW  COMPARISON:  None.   FINDINGS: The heart size and mediastinal contours are within normal limits. Both lungs are clear. The visualized skeletal structures are unremarkable.  IMPRESSION: No active cardiopulmonary disease.   Electronically Signed   By: Rolm Baptise M.D.   On: 01/24/2015 09:28   US Abdomen Complete  01/24/2015   CLINICAL DATA:  Abdominal pain for 4 days.  Elevated LFTs.  EXAM: ULTRASOUND ABDOMEN  COMPLETE  COMPARISON:  01/22/2015  FINDINGS: Gallbladder: No gallstones or wall thickening visualized. No sonographic Murphy sign noted.  Common bile duct: Diameter: 0.2 cm.  Liver: No focal lesion identified. Within normal limits in parenchymal echogenicity.  IVC: No abnormality visualized.  Pancreas: Visualized portion unremarkable.  Spleen: Size and appearance within normal limits.  Right Kidney: Length: 11.6 cm. Echogenicity within normal limits. No mass or hydronephrosis visualized.  Left Kidney: Length: 11.2 cm. Echogenicity within normal limits. No mass or hydronephrosis visualized.  Abdominal aorta: No aneurysm visualized.  Other findings: None.  IMPRESSION: Normal abdominal ultrasound.   Electronically Signed   By: Markus Daft M.D.   On: 01/24/2015 09:50   Ct Abdomen Pelvis W Contrast  01/22/2015   CLINICAL DATA:  Epigastric pain and vomiting, 3 days duration. Gross hematuria is well. Elevated white count.  EXAM: CT ABDOMEN AND PELVIS WITH CONTRAST  TECHNIQUE: Multidetector CT imaging of the abdomen and pelvis was performed using the standard protocol following bolus administration of intravenous contrast.  CONTRAST:  30mL OMNIPAQUE IOHEXOL 300 MG/ML SOLN, 168mL OMNIPAQUE IOHEXOL 300 MG/ML SOLN  COMPARISON:  07/25/2010  FINDINGS: Lung bases are clear. No pleural or pericardial fluid. The liver has normal appearance without focal lesions or biliary ductal dilatation. No calcified gallstones. The spleen is normal. The pancreas is normal. The adrenal glands are normal. The kidneys are normal. No cyst, mass, stone or  hydronephrosis. The aorta and IVC are normal. No retroperitoneal mass or adenopathy. No free intraperitoneal fluid or air. No visible bowel pathology. The appendix looks normal. The bladder is normal. The uterus is retroverted. The ovaries appear slightly prominent but otherwise unremarkable, possibly because of the retroverted position. No free fluid. No significant bony finding.  Compared to the previous study, no change is appreciated except for resolution of enlarged nodes in the right lower quadrant mesenteric previously seen but normal today.  IMPRESSION: No abnormality seen to explain the presenting symptoms.   Electronically Signed   By: Nelson Chimes M.D.   On: 01/22/2015 22:05    ROS:  As stated above in the HPI otherwise negative.  Blood pressure 133/88, pulse 79, temperature 98.1 F (36.7 C), temperature source Oral, resp. rate 18, height 5\' 7"  (1.702 m), weight 100.109 kg (220 lb 11.2 oz), last menstrual period 01/19/2015, SpO2 100 %.    PE: Gen: NAD, Alert and Oriented HEENT:  [place]/AT, EOMI Neck: Supple, no LAD Lungs: CTA Bilaterally CV: RRR without M/G/R ABM: Soft, minimal epigastric pain, +BS Ext: No C/C/E  Assessment/Plan: 1) Hematemesis. 2) One melenic stool. 3) Epigastric pain.   She states that she is better at this time with the pain medication.  The patient may have esophagitis versus an exacerbation of GERD.  I cannot be certain at this time.  With the reports of hematemesis, a melenic bowel movement, and some elevation in her WBC, it is not unreasonable to perform an EGD.  Plan: 1) EGD tomorrow with Dr. Henrene Pastor.  Omarius Grantham D 01/24/2015, 6:41 PM

## 2015-01-25 NOTE — Discharge Summary (Signed)
Physician Discharge Summary  Jasmine Mason BJY:[id number] DOB: Dec 05, 1985 DOA: 01/23/2015  PCP: No PCP Per Patient  Admit date: 01/23/2015 Discharge date: 01/25/2015  Time spent: Less than 30 minutes  Recommendations for Outpatient Follow-up:  1. PCP in 2 weeks. Requested case management to assist with finding a new PCP.  Discharge Diagnoses:  Principal Problem:   Abdominal pain Active Problems:   GERD (gastroesophageal reflux disease)   Hypokalemia   Leukocytosis   Chest pain   Cough   Pain of left calf   Obesity   Elevated troponin   Abdominal pain, epigastric   Discharge Condition: Improved & Stable  Diet recommendation: Heart healthy diet.  Filed Weights   01/23/15 2051 01/24/15 0145 01/25/15 0521  Weight: 96.163 kg (212 lb) 100.109 kg (220 lb 11.2 oz) 99.791 kg (220 lb)    History of present illness:  29 year old female patient with history of GERD, ongoing tobacco abuse, presented with several episodes of intractable nausea, vomiting and abdominal pain since Thursday of last week. Emesis mostly non-bloody except had 1 episode of small amount of blood and subsequent flecks of black in emesis. Black tarry stools 1 but no diarrhea. Pain is epigastric/retrosternal and nonradiating, worse with emesis. No cyclic contacts or eating anything unusual. Denies NSAID use or alcohol intake. She started having chest pain day prior to admission, substernal, 4/10 in severity, worse with deep breaths or dry heaves. Denies dyspnea. In the ED, CT abdomen and pelvis negative for acute findings, lipase 58, WBC 15.6, potassium 2.8, urine analysis-dirty catch with many squamous cells, d-dimer negative. Mild solitary elevation of troponin. Cardiology and GI consulted.  Hospital Course:   Nausea, vomiting & abdominal pain/mild upper GI bleed - DD: Acute viral GE, GERD - Treated supportively with bowel rest, IV fluids, IV PPI and antiemetics. - CT abdomen and pelvis: No acute findings. Abdominal  ultrasound: Normal exam - Clinically improved with no further emesis or abdominal pain since 7/3. - Hemoglobin stable despite reported GI bleed suggesting minimal bleed.  - GI was consulted. Patient underwent EGD which was normal. Per GI recommendations, antireflux regimen, particularly attention to weight loss & PPI. - Patient has tolerated diet and will be discharged home.  Atypical chest pain - Likely GI in origin. - Initial troponin 0.06 but subsequent troponin negative. EKG without acute findings. - D-dimer negative. - Left lower extremity venous Doppler: No evidence of DVT - Chest x-ray negative. - 2-D echo: Normal - Cardiology follow-up appreciated: Suspect chest pain was GI in origin. No further cardiac workup recommended.  Hypokalemia - Replaced  Left calf pain - Negative venous Doppler - Resolved.  Tobacco abuse - Cessation counseled. Declined nicotine patch at discharge. - Denies alcohol abuse  Leukocytosis - Likely stress response. Resolved  THC abuse - Cessation counseled.   Consultants:  Cardiology  GI  Procedures:  2-D echo 01/24/15: Study Conclusions  - Left ventricle: The cavity size was normal. Wall thickness was normal. The estimated ejection fraction was 60%. Wall motion was normal; there were no regional wall motion abnormalities. - Right ventricle: The cavity size was normal. Systolic function was normal.  Left lower extremity venous duplex completed. 01/24/15  Preliminary report: Left: No evidence of DVT, superficial thrombosis, or Baker's cyst.   EGD 01/25/15: ENDOSCOPIC IMPRESSION: 1. Normal EGD 2. GERD  RECOMMENDATIONS: 1. Anti-reflux regimen to be followed ) particular attention to weight loss) 2. Convert PPI to oral pantoprazole 40 mg twice daily for 2 weeks then 40 mg once daily indefinitely  3. Antiemetics as needed 4. Advance diet as tolerated 5. Would discontinue narcotics 6. No further GI workup planned. GI  follow-up not necessary. She should find a PCP. Will sign off. Thank you    Discharge Exam:  Complaints: Seen this morning prior to EGD. Denied complaints. No nausea, vomiting, chest pain or abdominal pain. Had 1 small black BM last night.  Filed Vitals:   01/25/15 1034 01/25/15 1040 01/25/15 1050 01/25/15 1100  BP: 150/103 145/91 151/93 141/86  Pulse: 85 81 80 84  Temp:      TempSrc:      Resp: 20 15 17 17   Height:      Weight:      SpO2: 99% 97% 96% 95%   temperature: 99.31F.  General exam: Moderately built and overweight pleasant young female lying comfortably in bed. Respiratory system: Clear. No increased work of breathing. Cardiovascular system: S1 & S2 heard, RRR. No JVD, murmurs, gallops, clicks or pedal edema. Telemetry: Sinus rhythm. Gastrointestinal system: Abdomen is nondistended, soft and nontender. Normal bowel sounds heard. Central nervous system: Alert and oriented. No focal neurological deficits. Extremities: Symmetric 5 x 5 power.  Discharge Instructions      Discharge Instructions    Call MD for:  extreme fatigue    Complete by:  As directed      Call MD for:  persistant dizziness or light-headedness    Complete by:  As directed      Call MD for:  persistant nausea and vomiting    Complete by:  As directed      Call MD for:  severe uncontrolled pain    Complete by:  As directed      Call MD for:    Complete by:  As directed   Vomiting coffee-ground material or blood. Persistent black stools or blood in stools.     Diet - low sodium heart healthy    Complete by:  As directed      Increase activity slowly    Complete by:  As directed             Medication List    STOP taking these medications        HYDROcodone-acetaminophen 5-325 MG per tablet  Commonly known as:  NORCO/VICODIN     ondansetron 4 MG disintegrating tablet  Commonly known as:  ZOFRAN ODT     PRILOSEC OTC 20 MG tablet  Generic drug:  omeprazole      TAKE these  medications        pantoprazole 40 MG tablet  Commonly known as:  PROTONIX  Take 1 tablet (40 mg total) by mouth 2 (two) times daily before a meal. 1 tablet 2 times daily 2 weeks, then 1 tablet daily.       Follow-up Information    Follow up with Primary Medical Doctor. Schedule an appointment as soon as possible for a visit in 2 weeks.       The results of significant diagnostics from this hospitalization (including imaging, microbiology, ancillary and laboratory) are listed below for reference.    Significant Diagnostic Studies: Dg Chest 2 View  01/24/2015   CLINICAL DATA:  Vomiting. Abdominal pain, chest pain, shortness of breath since Thursday.  EXAM: CHEST  2 VIEW  COMPARISON:  None.  FINDINGS: The heart size and mediastinal contours are within normal limits. Both lungs are clear. The visualized skeletal structures are unremarkable.  IMPRESSION: No active cardiopulmonary disease.   Electronically Signed   By: Lennette Bihari  [place] M.D.   On: 01/24/2015 09:28   US Abdomen Complete  01/24/2015   CLINICAL DATA:  Abdominal pain for 4 days.  Elevated LFTs.  EXAM: ULTRASOUND ABDOMEN COMPLETE  COMPARISON:  01/22/2015  FINDINGS: Gallbladder: No gallstones or wall thickening visualized. No sonographic Murphy sign noted.  Common bile duct: Diameter: 0.2 cm.  Liver: No focal lesion identified. Within normal limits in parenchymal echogenicity.  IVC: No abnormality visualized.  Pancreas: Visualized portion unremarkable.  Spleen: Size and appearance within normal limits.  Right Kidney: Length: 11.6 cm. Echogenicity within normal limits. No mass or hydronephrosis visualized.  Left Kidney: Length: 11.2 cm. Echogenicity within normal limits. No mass or hydronephrosis visualized.  Abdominal aorta: No aneurysm visualized.  Other findings: None.  IMPRESSION: Normal abdominal ultrasound.   Electronically Signed   By: Markus Daft M.D.   On: 01/24/2015 09:50   Ct Abdomen Pelvis W Contrast  01/22/2015   CLINICAL DATA:   Epigastric pain and vomiting, 3 days duration. Gross hematuria is well. Elevated white count.  EXAM: CT ABDOMEN AND PELVIS WITH CONTRAST  TECHNIQUE: Multidetector CT imaging of the abdomen and pelvis was performed using the standard protocol following bolus administration of intravenous contrast.  CONTRAST:  52mL OMNIPAQUE IOHEXOL 300 MG/ML SOLN, 159mL OMNIPAQUE IOHEXOL 300 MG/ML SOLN  COMPARISON:  07/25/2010  FINDINGS: Lung bases are clear. No pleural or pericardial fluid. The liver has normal appearance without focal lesions or biliary ductal dilatation. No calcified gallstones. The spleen is normal. The pancreas is normal. The adrenal glands are normal. The kidneys are normal. No cyst, mass, stone or hydronephrosis. The aorta and IVC are normal. No retroperitoneal mass or adenopathy. No free intraperitoneal fluid or air. No visible bowel pathology. The appendix looks normal. The bladder is normal. The uterus is retroverted. The ovaries appear slightly prominent but otherwise unremarkable, possibly because of the retroverted position. No free fluid. No significant bony finding.  Compared to the previous study, no change is appreciated except for resolution of enlarged nodes in the right lower quadrant mesenteric previously seen but normal today.  IMPRESSION: No abnormality seen to explain the presenting symptoms.   Electronically Signed   By: Nelson Chimes M.D.   On: 01/22/2015 22:05    Microbiology: No results found for this or any previous visit (from the past 240 hour(s)).   Labs: Basic Metabolic Panel:  Recent Labs Lab 01/22/15 1835 01/23/15 2115 01/24/15 0352 01/25/15 0544  NA 137 139 140 140  K 3.1* 2.8* 3.4* 3.8  CL 100* 101 103 108  CO2 25 28 28 24   GLUCOSE 129* 115* 103* 92  BUN 13 10 <5* <5*  CREATININE 0.77 0.80 0.77 0.68  CALCIUM 9.3 8.9 8.2* 8.5*  MG  --   --  1.7  --    Liver Function Tests:  Recent Labs Lab 01/22/15 1835 01/23/15 2115 01/24/15 0352  AST 32 35 29  ALT  24 26 23   ALKPHOS 92 80 69  BILITOT 0.8 0.5 0.8  PROT 8.4* 7.2 6.5  ALBUMIN 4.4 4.3 3.6    Recent Labs Lab 01/22/15 1835 01/23/15 2115  LIPASE 12* 58*   No results for input(s): AMMONIA in the last 168 hours. CBC:  Recent Labs Lab 01/22/15 1835 01/23/15 2115 01/24/15 0352 01/25/15 0544  WBC 19.6* 15.6* 13.8* 8.2  NEUTROABS 16.3* 8.7*  --   --   HGB 16.0* 14.5 13.4 13.4  HCT 46.6* 43.2 39.9 39.1  MCV 87.3 88.5 89.3 86.7  PLT 394  340 296 157   Cardiac Enzymes:  Recent Labs Lab 01/24/15 0352 01/24/15 0955 01/24/15 1610  TROPONINI 0.06* <0.03 <0.03   BNP: BNP (last 3 results) No results for input(s): BNP in the last 8760 hours.  ProBNP (last 3 results) No results for input(s): PROBNP in the last 8760 hours.  CBG:  Recent Labs Lab 01/24/15 0651 01/24/15 1141 01/24/15 1637 01/24/15 2205 01/25/15 1109  GLUCAP 88 82 76 89 79     Additional labs: 1. Lipid panel: Cholesterol 146, triglycerides 73, HDL 36, LDL 95 and VLDL 15. 2. D-dimer: 0.48 3. Hemoglobin A1c: 5.5 4. Urine pregnancy test: Negative 5. HIV antibody screen: Nonreactive 6. Urine Legionella and streptococcal antigen: Negative 7. UDS: Positive for opiates and THC.   Signed:  Vernell Leep, MD, FACP, FHM. [facility] Pager [phone]  If 7PM-7AM, please contact night-coverage [email or web address] Password [email or web address] 01/25/2015, 1:06 PM

## 2015-01-25 NOTE — Progress Notes (Signed)
Patient Name: Jasmine Mason Date of Encounter: 01/25/2015  Primary Cardiologist: new   Principal Problem:   Abdominal pain Active Problems:   GERD (gastroesophageal reflux disease)   Hypokalemia   Leukocytosis   Chest pain   Cough   Pain of left calf   Obesity   Elevated troponin   Abdominal pain, epigastric    SUBJECTIVE  C/O abdominal pain; groggy post EGD  CURRENT MEDS . aspirin EC  81 mg Oral Daily  . atorvastatin  40 mg Oral q1800  . dextromethorphan-guaiFENesin  1 tablet Oral BID  . nicotine  21 mg Transdermal Daily  . pantoprazole (PROTONIX) IV  40 mg Intravenous Q24H  . sodium chloride  3 mL Intravenous Q12H    OBJECTIVE  Filed Vitals:   01/25/15 1015 01/25/15 1020 01/25/15 1027 01/25/15 1030  BP:  173/107 177/142 170/113  Pulse: 79 82    Temp:      TempSrc:      Resp: 24 18 18 20   Height:      Weight:      SpO2: 98% 97%      Intake/Output Summary (Last 24 hours) at 01/25/15 1034 Last data filed at 01/25/15 0600  Gross per 24 hour  Intake 1322.92 ml  Output   1800 ml  Net -477.08 ml   Filed Weights   01/23/15 2051 01/24/15 0145 01/25/15 0521  Weight: 212 lb (96.163 kg) 220 lb 11.2 oz (100.109 kg) 220 lb (99.791 kg)    PHYSICAL EXAM  General: Pleasant, NAD. Neuro: Alert and oriented X 3. Moves all extremities spontaneously. HEENT:  Normal  Neck: Supple  Lungs:  CTA. Heart: RRR no s3, s4, or murmurs. Abdomen: Soft, non-tender, non-distended Extremities: No edema.   Accessory Clinical Findings  CBC  Recent Labs  01/22/15 1835 01/23/15 2115 01/24/15 0352 01/25/15 0544  WBC 19.6* 15.6* 13.8* 8.2  NEUTROABS 16.3* 8.7*  --   --   HGB 16.0* 14.5 13.4 13.4  HCT 46.6* 43.2 39.9 39.1  MCV 87.3 88.5 89.3 86.7  PLT 394 340 296 009   Basic Metabolic Panel  Recent Labs  01/24/15 0352 01/25/15 0544  NA 140 140  K 3.4* 3.8  CL 103 108  CO2 28 24  GLUCOSE 103* 92  BUN <5* <5*  CREATININE 0.77 0.68  CALCIUM 8.2* 8.5*  MG 1.7   --    Liver Function Tests  Recent Labs  01/23/15 2115 01/24/15 0352  AST 35 29  ALT 26 23  ALKPHOS 80 69  BILITOT 0.5 0.8  PROT 7.2 6.5  ALBUMIN 4.3 3.6    Recent Labs  01/22/15 1835 01/23/15 2115  LIPASE 12* 58*   Cardiac Enzymes  Recent Labs  01/24/15 0352 01/24/15 0955 01/24/15 1610  TROPONINI 0.06* <0.03 <0.03   D-Dimer  Recent Labs  01/24/15 0352  DDIMER 0.48   Fasting Lipid Panel  Recent Labs  01/24/15 0352  CHOL 146  HDL 36*  LDLCALC 95  TRIG 73  CHOLHDL 4.1    TELE NSR     Echocardiogram 01/24/2015  LV EF: 60%  ------------------------------------------------------------------- Indications:   Chest pain 786.51.  ------------------------------------------------------------------- History:  PMH:  Dyspnea. Risk factors: Current tobacco use.  ------------------------------------------------------------------- Study Conclusions  - Left ventricle: The cavity size was normal. Wall thickness was normal. The estimated ejection fraction was 60%. Wall motion was normal; there were no regional wall motion abnormalities. - Right ventricle: The cavity size was normal. Systolic function was normal.  Radiology/Studies  Dg Chest 2 View  01/24/2015   CLINICAL DATA:  Vomiting. Abdominal pain, chest pain, shortness of breath since Thursday.  EXAM: CHEST  2 VIEW  COMPARISON:  None.  FINDINGS: The heart size and mediastinal contours are within normal limits. Both lungs are clear. The visualized skeletal structures are unremarkable.  IMPRESSION: No active cardiopulmonary disease.   Electronically Signed   By: Rolm Baptise M.D.   On: 01/24/2015 09:28   US Abdomen Complete  01/24/2015   CLINICAL DATA:  Abdominal pain for 4 days.  Elevated LFTs.  EXAM: ULTRASOUND ABDOMEN COMPLETE  COMPARISON:  01/22/2015  FINDINGS: Gallbladder: No gallstones or wall thickening visualized. No sonographic Murphy sign noted.  Common bile duct: Diameter: 0.2  cm.  Liver: No focal lesion identified. Within normal limits in parenchymal echogenicity.  IVC: No abnormality visualized.  Pancreas: Visualized portion unremarkable.  Spleen: Size and appearance within normal limits.  Right Kidney: Length: 11.6 cm. Echogenicity within normal limits. No mass or hydronephrosis visualized.  Left Kidney: Length: 11.2 cm. Echogenicity within normal limits. No mass or hydronephrosis visualized.  Abdominal aorta: No aneurysm visualized.  Other findings: None.  IMPRESSION: Normal abdominal ultrasound.   Electronically Signed   By: Markus Daft M.D.   On: 01/24/2015 09:50   Ct Abdomen Pelvis W Contrast  01/22/2015   CLINICAL DATA:  Epigastric pain and vomiting, 3 days duration. Gross hematuria is well. Elevated white count.  EXAM: CT ABDOMEN AND PELVIS WITH CONTRAST  TECHNIQUE: Multidetector CT imaging of the abdomen and pelvis was performed using the standard protocol following bolus administration of intravenous contrast.  CONTRAST:  51mL OMNIPAQUE IOHEXOL 300 MG/ML SOLN, 134mL OMNIPAQUE IOHEXOL 300 MG/ML SOLN  COMPARISON:  07/25/2010  FINDINGS: Lung bases are clear. No pleural or pericardial fluid. The liver has normal appearance without focal lesions or biliary ductal dilatation. No calcified gallstones. The spleen is normal. The pancreas is normal. The adrenal glands are normal. The kidneys are normal. No cyst, mass, stone or hydronephrosis. The aorta and IVC are normal. No retroperitoneal mass or adenopathy. No free intraperitoneal fluid or air. No visible bowel pathology. The appendix looks normal. The bladder is normal. The uterus is retroverted. The ovaries appear slightly prominent but otherwise unremarkable, possibly because of the retroverted position. No free fluid. No significant bony finding.  Compared to the previous study, no change is appreciated except for resolution of enlarged nodes in the right lower quadrant mesenteric previously seen but normal today.  IMPRESSION: No  abnormality seen to explain the presenting symptoms.   Electronically Signed   By: Nelson Chimes M.D.   On: 01/22/2015 22:05    ASSESSMENT AND PLAN  1. Atypical chest pain, abd pain N/V conerning for GI etiology  - serial trop negative after the first one 0.06, normal EKG  - Echo shows normal EF  - No further cardiac workup at this point.  2. Abdominal pain with vomiting  - GI evaluation in progress. EGD normal. Question gastroesophageal reflux disease.  3. Hematemasis and black tarry stool  No further cardiac evaluation planned. We will sign off. Please call with questions. Kirk Ruths

## 2015-01-25 NOTE — Discharge Instructions (Signed)
Gastrointestinal Bleeding Gastrointestinal (GI) bleeding means there is bleeding somewhere along the digestive tract, between the mouth and anus. CAUSES  There are many different problems that can cause GI bleeding. Possible causes include:  Esophagitis. This is inflammation, irritation, or swelling of the esophagus.  Hemorrhoids.These are veins that are full of blood (engorged) in the rectum. They cause pain, inflammation, and may bleed.  Anal fissures.These are areas of painful tearing which may bleed. They are often caused by passing hard stool.  Diverticulosis.These are pouches that form on the colon over time, with age, and may bleed significantly.  Diverticulitis.This is inflammation in areas with diverticulosis. It can cause pain, fever, and bloody stools, although bleeding is rare.  Polyps and cancer. Colon cancer often starts out as precancerous polyps.  Gastritis and ulcers.Bleeding from the upper gastrointestinal tract (near the stomach) may travel through the intestines and produce black, sometimes tarry, often bad smelling stools. In certain cases, if the bleeding is fast enough, the stools may not be black, but red. This condition may be life-threatening. SYMPTOMS   Vomiting bright red blood or material that looks like coffee grounds.  Bloody, black, or tarry stools. DIAGNOSIS  Your caregiver may diagnose your condition by taking your history and performing a physical exam. More tests may be needed, including:  X-rays and other imaging tests.  Esophagogastroduodenoscopy (EGD). This test uses a flexible, lighted tube to look at your esophagus, stomach, and small intestine.  Colonoscopy. This test uses a flexible, lighted tube to look at your colon. TREATMENT  Treatment depends on the cause of your bleeding.   For bleeding from the esophagus, stomach, small intestine, or colon, the caregiver doing your EGD or colonoscopy may be able to stop the bleeding as part of  the procedure.  Inflammation or infection of the colon can be treated with medicines.  Many rectal problems can be treated with creams, suppositories, or warm baths.  Surgery is sometimes needed.  Blood transfusions are sometimes needed if you have lost a lot of blood. If bleeding is slow, you may be allowed to go home. If there is a lot of bleeding, you will need to stay in the hospital for observation. HOME CARE INSTRUCTIONS   Take any medicines exactly as prescribed.  Keep your stools soft by eating foods that are high in fiber. These foods include whole grains, legumes, fruits, and vegetables. Prunes (1 to 3 a day) work well for many people.  Drink enough fluids to keep your urine clear or pale yellow. SEEK IMMEDIATE MEDICAL CARE IF:   Your bleeding increases.  You feel lightheaded, weak, or you faint.  You have severe cramps in your back or abdomen.  You pass large blood clots in your stool.  Your problems are getting worse. MAKE SURE YOU:   Understand these instructions.  Will watch your condition.  Will get help right away if you are not doing well or get worse. Document Released: 07/06/2000 Document Revised: 06/25/2012 Document Reviewed: 06/18/2011 [facility] Patient Information 2015 [place], [place]. This information is not intended to replace advice given to you by your health care provider. Make sure you discuss any questions you have with your health care provider.  Chest Pain (Nonspecific) It is often hard to give a specific diagnosis for the cause of chest pain. There is always a chance that your pain could be related to something serious, such as a heart attack or a blood clot in the lungs. You need to follow up with your  health care provider for further evaluation. CAUSES   Heartburn.  Pneumonia or bronchitis.  Anxiety or stress.  Inflammation around your heart (pericarditis) or lung (pleuritis or pleurisy).  A blood clot in the lung.  A collapsed lung  (pneumothorax). It can develop suddenly on its own (spontaneous pneumothorax) or from trauma to the chest.  Shingles infection (herpes zoster virus). The chest wall is composed of bones, muscles, and cartilage. Any of these can be the source of the pain.  The bones can be bruised by injury.  The muscles or cartilage can be strained by coughing or overwork.  The cartilage can be affected by inflammation and become sore (costochondritis). DIAGNOSIS  Lab tests or other studies may be needed to find the cause of your pain. Your health care provider may have you take a test called an ambulatory electrocardiogram (ECG). An ECG records your heartbeat patterns over a 24-hour period. You may also have other tests, such as:  Transthoracic echocardiogram (TTE). During echocardiography, sound waves are used to evaluate how blood flows through your heart.  Transesophageal echocardiogram (TEE).  Cardiac monitoring. This allows your health care provider to monitor your heart rate and rhythm in real time.  Holter monitor. This is a portable device that records your heartbeat and can help diagnose heart arrhythmias. It allows your health care provider to track your heart activity for several days, if needed.  Stress tests by exercise or by giving medicine that makes the heart beat faster. TREATMENT   Treatment depends on what may be causing your chest pain. Treatment may include:  Acid blockers for heartburn.  Anti-inflammatory medicine.  Pain medicine for inflammatory conditions.  Antibiotics if an infection is present.  You may be advised to change lifestyle habits. This includes stopping smoking and avoiding alcohol, caffeine, and chocolate.  You may be advised to keep your head raised (elevated) when sleeping. This reduces the chance of acid going backward from your stomach into your esophagus. Most of the time, nonspecific chest pain will improve within 2-3 days with rest and mild pain  medicine.  HOME CARE INSTRUCTIONS   If antibiotics were prescribed, take them as directed. Finish them even if you start to feel better.  For the next few days, avoid physical activities that bring on chest pain. Continue physical activities as directed.  Do not use any tobacco products, including cigarettes, chewing tobacco, or electronic cigarettes.  Avoid drinking alcohol.  Only take medicine as directed by your health care provider.  Follow your health care provider's suggestions for further testing if your chest pain does not go away.  Keep any follow-up appointments you made. If you do not go to an appointment, you could develop lasting (chronic) problems with pain. If there is any problem keeping an appointment, call to reschedule. SEEK MEDICAL CARE IF:   Your chest pain does not go away, even after treatment.  You have a rash with blisters on your chest.  You have a fever. SEEK IMMEDIATE MEDICAL CARE IF:   You have increased chest pain or pain that spreads to your arm, neck, jaw, back, or abdomen.  You have shortness of breath.  You have an increasing cough, or you cough up blood.  You have severe back or abdominal pain.  You feel nauseous or vomit.  You have severe weakness.  You faint.  You have chills. This is an emergency. Do not wait to see if the pain will go away. Get medical help at once. Call your  local emergency services (911 in [place] Do not drive yourself to the hospital. MAKE SURE YOU:   Understand these instructions.  Will watch your condition.  Will get help right away if you are not doing well or get worse. Document Released: 04/18/2005 Document Revised: 07/14/2013 Document Reviewed: 02/12/2008 [facility] Patient Information 2015 [place], [place]. This information is not intended to replace advice given to you by your health care provider. Make sure you discuss any questions you have with your health care provider.

## 2015-01-25 NOTE — Progress Notes (Signed)
Attempted to talk to patient about DCP/ follow up medical care. Patient does not have a PCP and is not interested in having a PCP. CM explained the importance of having a primary care physician. Patient stated " first I want to find out what is wrong with me," CM informed the patient that  The Attending MD will discuss with her his finding and treatment and irregardless, she needs a PCP. Patient is not willing to have the CM assist her in this area. Mindi Slicker RN,BSN,MHA [phone]

## 2015-01-25 NOTE — Progress Notes (Signed)
UR completed 

## 2015-01-26 ENCOUNTER — Encounter (HOSPITAL_COMMUNITY): Payer: Self-pay | Admitting: Internal Medicine

## 2015-01-26 DIAGNOSIS — Z8679 Personal history of other diseases of the circulatory system: Secondary | ICD-10-CM | POA: Diagnosis not present

## 2015-01-26 DIAGNOSIS — Y92410 Unspecified street and highway as the place of occurrence of the external cause: Secondary | ICD-10-CM | POA: Diagnosis not present

## 2015-01-26 DIAGNOSIS — S6992XA Unspecified injury of left wrist, hand and finger(s), initial encounter: Secondary | ICD-10-CM | POA: Insufficient documentation

## 2015-01-26 DIAGNOSIS — Y9389 Activity, other specified: Secondary | ICD-10-CM | POA: Insufficient documentation

## 2015-01-26 DIAGNOSIS — Z720 Tobacco use: Secondary | ICD-10-CM | POA: Diagnosis not present

## 2015-01-26 DIAGNOSIS — S199XXA Unspecified injury of neck, initial encounter: Secondary | ICD-10-CM | POA: Insufficient documentation

## 2015-01-26 DIAGNOSIS — K219 Gastro-esophageal reflux disease without esophagitis: Secondary | ICD-10-CM | POA: Insufficient documentation

## 2015-01-26 DIAGNOSIS — E669 Obesity, unspecified: Secondary | ICD-10-CM | POA: Insufficient documentation

## 2015-01-26 DIAGNOSIS — Y998 Other external cause status: Secondary | ICD-10-CM | POA: Insufficient documentation
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
## 2015-01-27 ENCOUNTER — Emergency Department (HOSPITAL_BASED_OUTPATIENT_CLINIC_OR_DEPARTMENT_OTHER)
Admission: EM | Admit: 2015-01-27 | Discharge: 2015-01-27 | Disposition: A | Discharge status: Home / Self Care | Payer: No Typology Code available for payment source | Attending: Emergency Medicine | Admitting: Emergency Medicine

## 2015-01-27 ENCOUNTER — Emergency Department (HOSPITAL_BASED_OUTPATIENT_CLINIC_OR_DEPARTMENT_OTHER): Payer: No Typology Code available for payment source

## 2015-01-27 ENCOUNTER — Encounter (HOSPITAL_BASED_OUTPATIENT_CLINIC_OR_DEPARTMENT_OTHER): Payer: Self-pay | Admitting: *Deleted

## 2015-01-27 DIAGNOSIS — S6992XA Unspecified injury of left wrist, hand and finger(s), initial encounter: Secondary | ICD-10-CM | POA: Diagnosis not present

## 2015-01-27 DIAGNOSIS — M25532 Pain in left wrist: Secondary | ICD-10-CM

## 2015-01-27 MED ORDER — HYDROCODONE-ACETAMINOPHEN 5-325 MG PO TABS: 1.0000 | ORAL_TABLET | ORAL | Status: DC | PRN

## 2015-01-27 MED ORDER — IBUPROFEN 800 MG PO TABS
800.0000 mg | ORAL_TABLET | Freq: Once | ORAL | Status: AC
Start: 2015-01-27 — End: 2015-01-27
  Administered 2015-01-27: 800 mg via ORAL
  Filled 2015-01-27: qty 1

## 2015-01-27 MED ORDER — IBUPROFEN 800 MG PO TABS: 800.0000 mg | ORAL_TABLET | Freq: Three times a day (TID) | ORAL | Status: DC | PRN
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
## 2015-01-29 LAB — CULTURE, BLOOD (ROUTINE X 2)
CULTURE: NO GROWTH
CULTURE: NO GROWTH

## 2015-04-21 ENCOUNTER — Emergency Department (HOSPITAL_BASED_OUTPATIENT_CLINIC_OR_DEPARTMENT_OTHER)
Admission: EM | Admit: 2015-04-21 | Discharge: 2015-04-21 | Disposition: A | Discharge status: Home / Self Care | Payer: No Typology Code available for payment source | Attending: Emergency Medicine | Admitting: Emergency Medicine

## 2015-04-21 ENCOUNTER — Encounter (HOSPITAL_BASED_OUTPATIENT_CLINIC_OR_DEPARTMENT_OTHER): Payer: Self-pay | Admitting: Emergency Medicine

## 2015-04-21 DIAGNOSIS — Z720 Tobacco use: Secondary | ICD-10-CM | POA: Diagnosis not present

## 2015-04-21 DIAGNOSIS — E669 Obesity, unspecified: Secondary | ICD-10-CM | POA: Insufficient documentation

## 2015-04-21 DIAGNOSIS — K0889 Other specified disorders of teeth and supporting structures: Secondary | ICD-10-CM

## 2015-04-21 DIAGNOSIS — K002 Abnormalities of size and form of teeth: Secondary | ICD-10-CM | POA: Insufficient documentation

## 2015-04-21 DIAGNOSIS — K219 Gastro-esophageal reflux disease without esophagitis: Secondary | ICD-10-CM | POA: Diagnosis not present

## 2015-04-21 DIAGNOSIS — Z8679 Personal history of other diseases of the circulatory system: Secondary | ICD-10-CM | POA: Diagnosis not present

## 2015-04-21 DIAGNOSIS — K0381 Cracked tooth: Secondary | ICD-10-CM | POA: Diagnosis not present

## 2015-04-21 DIAGNOSIS — K029 Dental caries, unspecified: Secondary | ICD-10-CM | POA: Diagnosis not present

## 2015-04-21 DIAGNOSIS — K088 Other specified disorders of teeth and supporting structures: Secondary | ICD-10-CM | POA: Insufficient documentation

## 2015-04-21 MED ORDER — PENICILLIN V POTASSIUM 250 MG PO TABS
500.0000 mg | ORAL_TABLET | Freq: Once | ORAL | Status: AC
  Administered 2015-04-21: 500 mg via ORAL
  Filled 2015-04-21: qty 2

## 2015-04-21 MED ORDER — TRAMADOL HCL 50 MG PO TABS
50.0000 mg | ORAL_TABLET | Freq: Once | ORAL | Status: AC
  Administered 2015-04-21: 50 mg via ORAL
  Filled 2015-04-21: qty 1

## 2015-04-21 MED ORDER — PENICILLIN V POTASSIUM 500 MG PO TABS: 500.0000 mg | ORAL_TABLET | Freq: Three times a day (TID) | ORAL | Status: DC

## 2015-04-21 MED ORDER — TRAMADOL HCL 50 MG PO TABS: 50.0000 mg | ORAL_TABLET | Freq: Four times a day (QID) | ORAL | Status: DC | PRN

## 2015-04-21 NOTE — Discharge Instructions (Signed)
Please read and follow all provided instructions.  Your diagnoses today include:  1. Pain, dental     The exam and treatment you received today has been provided on an emergency basis only. This is not a substitute for complete medical or dental care.  Tests performed today include:  Vital signs. See below for your results today.   Medications prescribed:   Penicillin - antibiotic  You have been prescribed an antibiotic medicine: take the entire course of medicine even if you are feeling better. Stopping early can cause the antibiotic not to work.   Tramadol - narcotic-like pain medication  DO NOT drive or perform any activities that require you to be awake and alert because this medicine can make you drowsy.   Take any prescribed medications only as directed.  Home care instructions:  Follow any educational materials contained in this packet.  Follow-up instructions: Please follow-up with your dentist for further evaluation of your symptoms.   Dental Assistance: See below for dental referrals  Return instructions:   Please return to the Emergency Department if you experience worsening symptoms.  Please return if you develop a fever, you develop more swelling in your face or neck, you have trouble breathing or swallowing food.  Please return if you have any other emergent concerns.  Additional Information:  Your vital signs today were: BP 146/97 mmHg   Pulse 99   Temp(Src) 98.5 F (36.9 C) (Oral)   Resp 20   Ht 5\' 7"  (1.702 m)   Wt 200 lb (90.719 kg)   BMI 31.32 kg/m2   SpO2 100%   LMP 04/07/2015 If your blood pressure (BP) was elevated above 135/85 this visit, please have this repeated by your doctor within one month. -------------- Dental Care: Organization         Address  Phone  Notes  [facility] Department of [facility] [place] [phone] Accepts children up to age 107 who are enrolled in [place] or  [place]; pregnant women with a Medicaid card; and children who have applied for Medicaid or [place] Health Choice, but were declined, whose parents can pay a reduced fee at time of service.  [employer] Department of [facility]  [address], [place] [phone] Accepts children up to age 65 who are enrolled in [place] or [place]; pregnant women with a Medicaid card; and children who have applied for Medicaid or [place] Health Choice, but were declined, whose parents can pay a reduced fee at time of service.  [place]  [place] [phone] Patients are seen by appointment only. Walk-ins are not accepted. [place] will see patients 46 years of age and older. Monday - Tuesday (8am-5pm) Most Wednesdays (8:30-5pm) $30 per visit, cash only  [facility] Adult [facility]  [address], [facility] [phone] Patients are seen by appointment only. Walk-ins are not accepted. [place] will see patients 45 years of age and older. One Wednesday Evening (Monthly: Volunteer Based).  $30 per visit, cash only  [place]  [phone] for adults; Children under age 109, call Graduate Pediatric Dentistry at [phone]. Children aged 62-14, please call [phone] to request a pediatric application.  Dental services are provided in all areas of dental care including fillings, crowns and bridges, complete and partial dentures, implants, gum treatment, root canals, and extractions.  Preventive care is also provided. Treatment is provided to both adults and children. Patients are selected via a lottery and there is often a waiting list.   [facility] [address] [place]  [phone] [email or web address]   [facility] [address] [place], [place] [phone], Ext. [phone] Second and Fourth Thursday of each month, opens at 6:30 AM; Clinic  ends at 9 AM.  Patients are seen on a first-come first-served basis, and a limited number are seen during each clinic.   [facility]  [address] [place], [place] [phone]   Eligibility Requirements You must have lived in [place], [place], or [place] counties for at least the last three months.   You cannot be eligible for state or federal sponsored [employer], including [employer], [place], or [employer].   You generally cannot be eligible for healthcare insurance through your employer.    How to apply: Eligibility screenings are held every Tuesday and Wednesday afternoon from 1:00 pm until 4:00 pm. You do not need an appointment for the interview!  [facility] [address], [place], [place]   [place]  [phone]

## 2015-04-21 NOTE — ED Provider Notes (Signed)
CSN: [id number]     Arrival date & time 04/21/15  1118 History   First MD Initiated Contact with Patient 04/21/15 1205     Chief Complaint  Patient presents with  . Dental Pain     (Consider location/radiation/quality/duration/timing/severity/associated sxs/prior Treatment) HPI Comments: Patient presents with acute onset, gradually worsening R upper dental pain x 2 days. She is going to see her dentist next week. She reports having a small bump on her gum which broke open, releasing a foul tasting substance in her mouth. This bump has improved. She gargled hydrogen peroxide at home. No fevers, neck pain, trouble breathing or swallowing.  Patient is a 29 y.o. female presenting with tooth pain. The history is provided by the patient.  Dental Pain Associated symptoms: no facial swelling, no fever, no headaches and no neck pain     Past Medical History  Diagnosis Date  . GERD (gastroesophageal reflux disease)   . Tobacco abuse   . Mesenteric adenitis   . Obesity    Past Surgical History  Procedure Laterality Date  . Orif ankle fracture Left 1990's  . Carpal tunnel release Left ~ 2013  . Fracture surgery    . Esophagogastroduodenoscopy N/A 01/25/2015    Procedure: ESOPHAGOGASTRODUODENOSCOPY (EGD);  Surgeon: Irene Shipper, MD;  Location: [facility] ENDOSCOPY;  Service: Endoscopy;  Laterality: N/A;   Family History  Problem Relation Age of Onset  . Hypertension Mother   . Hypertension Father   . Hypertension Sister   . Diabetes Father   . Heart disease Father    Social History  Substance Use Topics  . Smoking status: Current Every Day Smoker -- 0.25 packs/day for 10 years    Types: Cigarettes  . Smokeless tobacco: None  . Alcohol Use: No   OB History    No data available     Review of Systems  Constitutional: Negative for fever.  HENT: Positive for dental problem. Negative for ear pain, facial swelling, sore throat and trouble swallowing.   Respiratory: Negative for shortness of  breath and stridor.   Musculoskeletal: Negative for neck pain.  Skin: Negative for color change.  Neurological: Negative for headaches.      Allergies  Review of patient's allergies indicates no known allergies.  Home Medications   Prior to Admission medications   Medication Sig Start Date End Date Taking? Authorizing Provider  pantoprazole (PROTONIX) 40 MG tablet Take 1 tablet (40 mg total) by mouth 2 (two) times daily before a meal. 1 tablet 2 times daily 2 weeks, then 1 tablet daily. 01/25/15  Yes Modena Jansky, MD   BP 146/97 mmHg  Pulse 99  Temp(Src) 98.5 F (36.9 C) (Oral)  Resp 20  Ht 5\' 7"  (1.702 m)  Wt 200 lb (90.719 kg)  BMI 31.32 kg/m2  SpO2 100%  LMP 04/07/2015 Physical Exam  Constitutional: She appears well-developed and well-nourished.  HENT:  Head: Normocephalic and atraumatic.  Right Ear: Tympanic membrane, external ear and ear canal normal.  Left Ear: Tympanic membrane, external ear and ear canal normal.  Nose: Nose normal.  Mouth/Throat: Uvula is midline, oropharynx is clear and moist and mucous membranes are normal. No trismus in the jaw. Abnormal dentition. Dental caries present. No dental abscesses or uvula swelling. No tonsillar abscesses.  Patient was advanced periodontal disease. She has several missing teeth. She has pain and tenderness at the base of tooth #2 which is broken to the gumline. Gums are inflamed. No palpable abscess.  Eyes: Conjunctivae are  normal.  Neck: Normal range of motion. Neck supple.  No neck swelling or Ludwig's angina  Lymphadenopathy:    She has no cervical adenopathy.  Neurological: She is alert.  Skin: Skin is warm and dry.  Psychiatric: She has a normal mood and affect.  Nursing note and vitals reviewed.   ED Course  Procedures (including critical care time) Labs Review Labs Reviewed - No data to display  Imaging Review No results found. I have personally reviewed and evaluated these images and lab results as  part of my medical decision-making.   EKG Interpretation None       1:05 PM Patient seen and examined. Medications ordered.   Vital signs reviewed and are as follows: BP 146/97 mmHg  Pulse 99  Temp(Src) 98.5 F (36.9 C) (Oral)  Resp 20  Ht 5\' 7"  (1.702 m)  Wt 200 lb (90.719 kg)  BMI 31.32 kg/m2  SpO2 100%  LMP 04/07/2015  Patient counseled on use of narcotic pain medications. Counseled not to combine these medications with others containing tylenol. Urged not to drink alcohol, drive, or perform any other activities that requires focus while taking these medications. The patient verbalizes understanding and agrees with the plan.  Patient counseled to take prescribed medications as directed, return with worsening facial or neck swelling, and to follow-up with their dentist as soon as possible.    MDM   Final diagnoses:  Pain, dental   Patient with toothache. No fever. Exam unconcerning for Ludwig's angina or other deep tissue infection in neck.   As there is gum swelling, erythema, will treat with antibiotic and pain medicine. Urged patient to follow-up with dentist.        Carlisle Cater, PA-C 04/21/15 Agency, MD 04/22/15 [phone]

## 2015-04-21 NOTE — ED Notes (Signed)
Pt awoken with right upper back tooth pain this am, attempted to get in to see dentist and unable , not reporting right temporal head pain associated with tooth pain

## 2015-05-02 ENCOUNTER — Ambulatory Visit: Payer: Self-pay | Admitting: Pediatrics

## 2015-05-03 ENCOUNTER — Encounter: Payer: Self-pay | Admitting: Pediatrics

## 2015-06-03 ENCOUNTER — Encounter: Payer: Self-pay | Admitting: Emergency Medicine

## 2015-06-03 ENCOUNTER — Emergency Department
Admission: EM | Admit: 2015-06-03 | Discharge: 2015-06-03 | Disposition: A | Discharge status: Home / Self Care | Payer: Self-pay | Attending: Emergency Medicine | Admitting: Emergency Medicine

## 2015-06-03 ENCOUNTER — Emergency Department: Payer: No Typology Code available for payment source

## 2015-06-03 DIAGNOSIS — R1011 Right upper quadrant pain: Secondary | ICD-10-CM | POA: Insufficient documentation

## 2015-06-03 DIAGNOSIS — R1110 Vomiting, unspecified: Secondary | ICD-10-CM

## 2015-06-03 DIAGNOSIS — Z720 Tobacco use: Secondary | ICD-10-CM | POA: Insufficient documentation

## 2015-06-03 DIAGNOSIS — R112 Nausea with vomiting, unspecified: Secondary | ICD-10-CM | POA: Insufficient documentation

## 2015-06-03 DIAGNOSIS — Z792 Long term (current) use of antibiotics: Secondary | ICD-10-CM | POA: Insufficient documentation

## 2015-06-03 DIAGNOSIS — R1013 Epigastric pain: Secondary | ICD-10-CM | POA: Insufficient documentation

## 2015-06-03 DIAGNOSIS — Z79899 Other long term (current) drug therapy: Secondary | ICD-10-CM | POA: Insufficient documentation

## 2015-06-03 DIAGNOSIS — Z3202 Encounter for pregnancy test, result negative: Secondary | ICD-10-CM | POA: Insufficient documentation

## 2015-06-03 LAB — CBC
HCT: 44.2 % (ref 35.0–47.0)
HEMOGLOBIN: 14.8 g/dL (ref 12.0–16.0)
MCH: 29.7 pg (ref 26.0–34.0)
MCHC: 33.6 g/dL (ref 32.0–36.0)
MCV: 88.4 fL (ref 80.0–100.0)
PLATELETS: 316 10*3/uL (ref 150–440)
RBC: 5 MIL/uL (ref 3.80–5.20)
RDW: 13.6 % (ref 11.5–14.5)
WBC: 14.4 10*3/uL — AB (ref 3.6–11.0)

## 2015-06-03 LAB — COMPREHENSIVE METABOLIC PANEL
ALT: 23 U/L (ref 14–54)
ANION GAP: 5 (ref 5–15)
AST: 25 U/L (ref 15–41)
Albumin: 4.7 g/dL (ref 3.5–5.0)
Alkaline Phosphatase: 93 U/L (ref 38–126)
BUN: 10 mg/dL (ref 6–20)
CHLORIDE: 107 mmol/L (ref 101–111)
CO2: 25 mmol/L (ref 22–32)
CREATININE: 0.64 mg/dL (ref 0.44–1.00)
Calcium: 9 mg/dL (ref 8.9–10.3)
Glucose, Bld: 139 mg/dL — ABNORMAL HIGH (ref 65–99)
Potassium: 3.7 mmol/L (ref 3.5–5.1)
Sodium: 137 mmol/L (ref 135–145)
Total Bilirubin: 0.8 mg/dL (ref 0.3–1.2)
Total Protein: 8.2 g/dL — ABNORMAL HIGH (ref 6.5–8.1)

## 2015-06-03 LAB — URINALYSIS COMPLETE WITH MICROSCOPIC (ARMC ONLY)
BILIRUBIN URINE: NEGATIVE
Glucose, UA: NEGATIVE mg/dL
LEUKOCYTES UA: NEGATIVE
Nitrite: NEGATIVE
Protein, ur: 30 mg/dL — AB
Specific Gravity, Urine: 1.019 (ref 1.005–1.030)
pH: 7 (ref 5.0–8.0)

## 2015-06-03 LAB — POCT PREGNANCY, URINE: Preg Test, Ur: NEGATIVE

## 2015-06-03 LAB — LIPASE, BLOOD: LIPASE: 27 U/L (ref 11–51)

## 2015-06-03 MED ORDER — ONDANSETRON HCL 4 MG/2ML IJ SOLN
INTRAMUSCULAR | Status: AC
  Filled 2015-06-03: qty 2

## 2015-06-03 MED ORDER — SODIUM CHLORIDE 0.9 % IV BOLUS (SEPSIS)
1000.0000 mL | Freq: Once | INTRAVENOUS | Status: AC
  Administered 2015-06-03: 1000 mL via INTRAVENOUS

## 2015-06-03 MED ORDER — ONDANSETRON HCL 4 MG/2ML IJ SOLN
4.0000 mg | Freq: Once | INTRAMUSCULAR | Status: AC
  Administered 2015-06-03: 4 mg via INTRAVENOUS

## 2015-06-03 MED ORDER — MORPHINE SULFATE (PF) 4 MG/ML IV SOLN
INTRAVENOUS | Status: AC
  Filled 2015-06-03: qty 1

## 2015-06-03 MED ORDER — HALOPERIDOL LACTATE 5 MG/ML IJ SOLN
INTRAMUSCULAR | Status: AC
  Filled 2015-06-03: qty 1

## 2015-06-03 MED ORDER — MORPHINE SULFATE (PF) 4 MG/ML IV SOLN
4.0000 mg | Freq: Once | INTRAVENOUS | Status: AC
  Administered 2015-06-03: 4 mg via INTRAVENOUS

## 2015-06-03 MED ORDER — SUCRALFATE 1 G PO TABS: 1.0000 g | ORAL_TABLET | Freq: Four times a day (QID) | ORAL | Status: DC

## 2015-06-03 MED ORDER — HALOPERIDOL LACTATE 5 MG/ML IJ SOLN
5.0000 mg | Freq: Once | INTRAMUSCULAR | Status: AC
  Administered 2015-06-03: 5 mg via INTRAVENOUS

## 2015-06-03 NOTE — ED Notes (Signed)
C/o vomiting.  Onset of symptoms last night.  States unable to keep anything down.

## 2015-06-03 NOTE — ED Provider Notes (Addendum)
[facility] Emergency Department Provider Note  ____________________________________________   I have reviewed the triage vital signs and the nursing notes.   HISTORY  Chief Complaint Emesis    HPI Jasmine Mason is a 29 y.o. female presents today complaining of nausea and vomiting with epigastric abdominal pain. She has history of reflux disease, obesity, history of mesenteric adenitis, denies any abdominal surgery in the past, has had an EGD apparently for this same abdominal pain. The patient denies any fever or chills. She is epigastric abdominal pain. Vomiting started yesterday. Denies any hematemesis. Denies any vaginal discharge, last  menstrual period was 2 weeks ago, the patient denies any bloody or bilious emesis denies any bright red blood per rectum or melena. This is a chronic recurring problem for the patient has had extensive workup including GI follow-up.  Past Medical History  Diagnosis Date  . GERD (gastroesophageal reflux disease)   . Tobacco abuse   . Mesenteric adenitis   . Obesity     Patient Active Problem List   Diagnosis Date Noted  . Abdominal pain, epigastric   . GERD (gastroesophageal reflux disease) 01/24/2015  . Hypokalemia 01/24/2015  . Abdominal pain 01/24/2015  . Leukocytosis 01/24/2015  . Chest pain 01/24/2015  . Cough 01/24/2015  . Pain of left calf 01/24/2015  . Elevated troponin 01/24/2015  . Obesity   . Mesenteric adenitis   . Tobacco abuse     Past Surgical History  Procedure Laterality Date  . Orif ankle fracture Left 1990's  . Carpal tunnel release Left ~ 2013  . Fracture surgery    . Esophagogastroduodenoscopy N/A 01/25/2015    Procedure: ESOPHAGOGASTRODUODENOSCOPY (EGD);  Surgeon: Irene Shipper, MD;  Location: [facility] ENDOSCOPY;  Service: Endoscopy;  Laterality: N/A;    Current Outpatient Rx  Name  Route  Sig  Dispense  Refill  . pantoprazole (PROTONIX) 40 MG tablet   Oral   Take 1 tablet (40 mg total) by  mouth 2 (two) times daily before a meal. 1 tablet 2 times daily 2 weeks, then 1 tablet daily.   60 tablet   0   . penicillin v potassium (VEETID) 500 MG tablet   Oral   Take 1 tablet (500 mg total) by mouth 3 (three) times daily.   21 tablet   0   . traMADol (ULTRAM) 50 MG tablet   Oral   Take 1 tablet (50 mg total) by mouth every 6 (six) hours as needed.   8 tablet   0     Allergies Review of patient's allergies indicates no known allergies.  Family History  Problem Relation Age of Onset  . Hypertension Mother   . Hypertension Father   . Hypertension Sister   . Diabetes Father   . Heart disease Father     Social History Social History  Substance Use Topics  . Smoking status: Current Every Day Smoker -- 0.25 packs/day for 10 years    Types: Cigarettes  . Smokeless tobacco: None  . Alcohol Use: No    Review of Systems Constitutional: No fever/chills Eyes: No visual changes. ENT: No sore throat. No stiff neck no neck pain Cardiovascular: Denies chest pain. Respiratory: Denies shortness of breath. Gastrointestinal:   no vomiting.  No diarrhea.  No constipation. Genitourinary: Negative for dysuria. Musculoskeletal: Negative lower extremity swelling Skin: Negative for rash. Neurological: Negative for headaches, focal weakness or numbness. 10-point ROS otherwise negative.  ____________________________________________   PHYSICAL EXAM:  VITAL SIGNS: ED Triage Vitals  Enc Vitals Group     BP 06/03/15 1416 164/108 mmHg     Pulse Rate 06/03/15 1416 74     Resp 06/03/15 1416 18     Temp 06/03/15 1416 98.4 F (36.9 C)     Temp src --      SpO2 06/03/15 1416 97 %     Weight 06/03/15 1416 200 lb (90.719 kg)     Height 06/03/15 1416 5\' 7"  (1.702 m)     Head Cir --      Peak Flow --      Pain Score 06/03/15 1415 5     Pain Loc --      Pain Edu? --      Excl. in [place]? --     Constitutional: Alert and oriented. Well appearing and in no acute distress. Eyes:  Conjunctivae are normal. PERRL. EOMI. Head: Atraumatic. Nose: No congestion/rhinnorhea. Mouth/Throat: Mucous membranes are moist.  Oropharynx non-erythematous. Neck: No stridor.   Nontender with no meningismus Cardiovascular: Normal rate, regular rhythm. Grossly normal heart sounds.  Good peripheral circulation. Respiratory: Normal respiratory effort.  No retractions. Lungs CTAB. Abdominal: Soft and minimal epigastric and slight right upper quadrant discomfort No distention. No guarding no rebound Back:  There is no focal tenderness or step off there is no midline tenderness there are no lesions noted. there is no CVA tenderness Musculoskeletal: No lower extremity tenderness. No joint effusions, no DVT signs strong distal pulses no edema Neurologic:  Normal speech and language. No gross focal neurologic deficits are appreciated.  Skin:  Skin is warm, dry and intact. No rash noted. Psychiatric: Mood and affect are normal. Speech and behavior are normal.  ____________________________________________   LABS (all labs ordered are listed, but only abnormal results are displayed)  Labs Reviewed  CBC - Abnormal; Notable for the following:    WBC 14.4 (*)    All other components within normal limits  LIPASE, BLOOD  COMPREHENSIVE METABOLIC PANEL  URINALYSIS COMPLETEWITH MICROSCOPIC ([place])  POC URINE PREG, ED   ____________________________________________  EKG  I personally interpreted any EKGs ordered by me or triage  ____________________________________________  RADIOLOGY  I reviewed any imaging ordered by me or triage that were performed during my shift ____________________________________________   PROCEDURES  Procedure(s) performed: None  Critical Care performed: None  ____________________________________________   INITIAL IMPRESSION / ASSESSMENT AND PLAN / ED COURSE  Pertinent labs & imaging results that were available during my care of the patient were reviewed  by me and considered in my medical decision making (see chart for details).  Very well-appearing 29 year old woman with nausea and vomiting with mostly epigastric discomfort. We'll obtain an ultrasound give her IV fluid and pain medication reassess.   -----------------------------------------   ----------------------------------------- 4:18 PM on 06/03/2015 -----------------------------------------  Patient has a chronic and recurring abdominal pain like this for "a long time". She has had it extensively worked up. I do not think that this represents ACS PE myocarditis endocarditis dissection or AAA is no evidence of perfect abdominal viscus there is no evidence of gallbladder disease she has no lower abdominal pain or discomfort. The patient is not vomiting but she states that she still feels too nauseated to go home. Her serial abdominal exams are quite benign. We will give her another antidiabetic medication and reassess. Her abdomen remains nonsurgical. The patient has not actually vomited since I have seen her.   5:27 PM on 06/03/2015 -----------------------------------------  Serial abdominal exams are benign, patient at this time  in no acute distress. Tolerating by mouth, workup reassuring, return precautions given and understood, she is eager to go home and we will discharge her with return precautions and follow-up given and understood. Do not feel that this likely represents referred cardiac pain very reproducible epigastric pain with vomiting in a patient who has had chronic epigastric pain and vomiting. We notes a normal echo a few months ago for similar complaint. Patient apparently also saw cardiology at that time. She does not complain of chest pain or shortness of breath she completed epigastric abdominal pain with vomiting.  ____________________________________________   FINAL CLINICAL IMPRESSION(S) / ED DIAGNOSES  Final diagnoses:  Vomiting     Schuyler Amor,  MD 06/03/15 1442  Schuyler Amor, MD 06/03/15 1619  Schuyler Amor, MD 06/03/15 1621  Schuyler Amor, MD 06/03/15 1727  Schuyler Amor, MD 06/03/15 1731

## 2015-06-03 NOTE — Discharge Instructions (Signed)

## 2015-06-04 ENCOUNTER — Encounter: Payer: Self-pay | Admitting: Emergency Medicine

## 2015-06-04 ENCOUNTER — Emergency Department: Payer: No Typology Code available for payment source

## 2015-06-04 ENCOUNTER — Emergency Department
Admission: EM | Admit: 2015-06-04 | Discharge: 2015-06-05 | Disposition: A | Discharge status: Home / Self Care | Payer: No Typology Code available for payment source | Attending: Student | Admitting: Student

## 2015-06-04 DIAGNOSIS — K529 Noninfective gastroenteritis and colitis, unspecified: Secondary | ICD-10-CM | POA: Insufficient documentation

## 2015-06-04 DIAGNOSIS — Z3202 Encounter for pregnancy test, result negative: Secondary | ICD-10-CM | POA: Insufficient documentation

## 2015-06-04 DIAGNOSIS — Z720 Tobacco use: Secondary | ICD-10-CM | POA: Insufficient documentation

## 2015-06-04 DIAGNOSIS — Z792 Long term (current) use of antibiotics: Secondary | ICD-10-CM | POA: Insufficient documentation

## 2015-06-04 DIAGNOSIS — Z79899 Other long term (current) drug therapy: Secondary | ICD-10-CM | POA: Insufficient documentation

## 2015-06-04 LAB — URINALYSIS COMPLETE WITH MICROSCOPIC (ARMC ONLY)
Bilirubin Urine: NEGATIVE
Glucose, UA: NEGATIVE mg/dL
KETONES UR: NEGATIVE mg/dL
LEUKOCYTES UA: NEGATIVE
Nitrite: NEGATIVE
PH: 6 (ref 5.0–8.0)
PROTEIN: NEGATIVE mg/dL
SPECIFIC GRAVITY, URINE: 1.004 — AB (ref 1.005–1.030)

## 2015-06-04 LAB — CBC WITH DIFFERENTIAL/PLATELET
BASOS ABS: 0 10*3/uL (ref 0–0.1)
Basophils Relative: 0 %
EOS PCT: 0 %
Eosinophils Absolute: 0 10*3/uL (ref 0–0.7)
HCT: 43.1 % (ref 35.0–47.0)
Hemoglobin: 14 g/dL (ref 12.0–16.0)
LYMPHS PCT: 17 %
Lymphs Abs: 2.5 10*3/uL (ref 1.0–3.6)
MCH: 28.5 pg (ref 26.0–34.0)
MCHC: 32.4 g/dL (ref 32.0–36.0)
MCV: 87.9 fL (ref 80.0–100.0)
Monocytes Absolute: 0.4 10*3/uL (ref 0.2–0.9)
Monocytes Relative: 3 %
Neutro Abs: 12.2 10*3/uL — ABNORMAL HIGH (ref 1.4–6.5)
Neutrophils Relative %: 80 %
PLATELETS: 331 10*3/uL (ref 150–440)
RBC: 4.91 MIL/uL (ref 3.80–5.20)
RDW: 13.5 % (ref 11.5–14.5)
WBC: 15.2 10*3/uL — AB (ref 3.6–11.0)

## 2015-06-04 LAB — POCT PREGNANCY, URINE: Preg Test, Ur: NEGATIVE

## 2015-06-04 LAB — HCG, QUANTITATIVE, PREGNANCY: hCG, Beta Chain, Quant, S: <1 m[IU]/mL (ref <5)

## 2015-06-04 MED ORDER — ONDANSETRON HCL 4 MG/2ML IJ SOLN
4.0000 mg | Freq: Once | INTRAMUSCULAR | Status: AC
  Administered 2015-06-04: 4 mg via INTRAVENOUS
  Filled 2015-06-04: qty 2

## 2015-06-04 MED ORDER — SODIUM CHLORIDE 0.9 % IV BOLUS (SEPSIS)
1000.0000 mL | Freq: Once | INTRAVENOUS | Status: AC
  Administered 2015-06-04: 1000 mL via INTRAVENOUS

## 2015-06-04 MED ORDER — IOHEXOL 240 MG/ML SOLN
25.0000 mL | Freq: Once | INTRAMUSCULAR | Status: AC | PRN
  Administered 2015-06-04: 25 mL via ORAL

## 2015-06-04 MED ORDER — MORPHINE SULFATE (PF) 4 MG/ML IV SOLN
4.0000 mg | Freq: Once | INTRAVENOUS | Status: AC
  Administered 2015-06-04: 4 mg via INTRAVENOUS
  Filled 2015-06-04: qty 1

## 2015-06-04 MED ORDER — IOHEXOL 300 MG/ML  SOLN
125.0000 mL | Freq: Once | INTRAMUSCULAR | Status: AC | PRN
  Administered 2015-06-04: 100 mL via INTRAVENOUS

## 2015-06-04 NOTE — ED Notes (Signed)
States seen last night and given rx for zofran - states not working

## 2015-06-04 NOTE — ED Provider Notes (Addendum)
[facility] Emergency Department Provider Note  ____________________________________________   I have reviewed the triage vital signs and the nursing notes.   HISTORY  Chief Complaint Emesis    HPI Jasmine Mason is a 29 y.o. female who was seen here yesterday by me for nausea and vomiting has not developed diarrhea. She also is having some cramping and she vomited a few more times at home.She has not had any fever or chills, she denies vaginal discharge, no bloody or melanotic stool, no bright red blood per rectum, no hematemesis. She feels somewhat better than she did yesterday but the Zofran did not completely resolve her vomiting. She was able eat some chicken soup today. No recent travel or recent antibiotics.  Past Medical History  Diagnosis Date  . GERD (gastroesophageal reflux disease)   . Tobacco abuse   . Mesenteric adenitis   . Obesity     Patient Active Problem List   Diagnosis Date Noted  . Abdominal pain, epigastric   . GERD (gastroesophageal reflux disease) 01/24/2015  . Hypokalemia 01/24/2015  . Abdominal pain 01/24/2015  . Leukocytosis 01/24/2015  . Chest pain 01/24/2015  . Cough 01/24/2015  . Pain of left calf 01/24/2015  . Elevated troponin 01/24/2015  . Obesity   . Mesenteric adenitis   . Tobacco abuse     Past Surgical History  Procedure Laterality Date  . Orif ankle fracture Left 1990's  . Carpal tunnel release Left ~ 2013  . Fracture surgery    . Esophagogastroduodenoscopy N/A 01/25/2015    Procedure: ESOPHAGOGASTRODUODENOSCOPY (EGD);  Surgeon: Irene Shipper, MD;  Location: [facility] ENDOSCOPY;  Service: Endoscopy;  Laterality: N/A;    Current Outpatient Rx  Name  Route  Sig  Dispense  Refill  . pantoprazole (PROTONIX) 40 MG tablet   Oral   Take 1 tablet (40 mg total) by mouth 2 (two) times daily before a meal. 1 tablet 2 times daily 2 weeks, then 1 tablet daily.   60 tablet   0   . penicillin v potassium (VEETID) 500 MG  tablet   Oral   Take 1 tablet (500 mg total) by mouth 3 (three) times daily.   21 tablet   0   . sucralfate (CARAFATE) 1 G tablet   Oral   Take 1 tablet (1 g total) by mouth 4 (four) times daily.   28 tablet   1   . traMADol (ULTRAM) 50 MG tablet   Oral   Take 1 tablet (50 mg total) by mouth every 6 (six) hours as needed.   8 tablet   0     Allergies Review of patient's allergies indicates no known allergies.  Family History  Problem Relation Age of Onset  . Hypertension Mother   . Hypertension Father   . Hypertension Sister   . Diabetes Father   . Heart disease Father     Social History Social History  Substance Use Topics  . Smoking status: Current Every Day Smoker -- 0.25 packs/day for 10 years    Types: Cigarettes  . Smokeless tobacco: None  . Alcohol Use: No    Review of Systems Constitutional: No fever/chills Eyes: No visual changes. ENT: No sore throat. No stiff neck no neck pain Cardiovascular: Denies chest pain. Respiratory: Denies shortness of breath. Gastrointestinal:  See history of present illness Genitourinary: Negative for dysuria. Musculoskeletal: Negative lower extremity swelling Skin: Negative for rash. Neurological: Negative for headaches, focal weakness or numbness. 10-point ROS otherwise negative.  ____________________________________________   PHYSICAL EXAM:  VITAL SIGNS: ED Triage Vitals  Enc Vitals Group     BP 06/04/15 1904 158/90 mmHg     Pulse Rate 06/04/15 1904 91     Resp 06/04/15 1904 18     Temp 06/04/15 1904 99 F (37.2 C)     Temp Source 06/04/15 1904 Oral     SpO2 06/04/15 1904 100 %     Weight 06/04/15 1904 200 lb (90.719 kg)     Height 06/04/15 1904 5\' 7"  (1.702 m)     Head Cir --      Peak Flow --      Pain Score 06/04/15 1905 10     Pain Loc --      Pain Edu? --      Excl. in [place]? --     Constitutional: Alert and oriented. Well appearing and in no acute distress. Eyes: Conjunctivae are normal. PERRL.  EOMI. Head: Atraumatic. Nose: No congestion/rhinnorhea. Mouth/Throat: Mucous membranes are moist.  Oropharynx non-erythematous. Neck: No stridor.   Nontender with no meningismus Cardiovascular: Normal rate, regular rhythm. Grossly normal heart sounds.  Good peripheral circulation. Respiratory: Normal respiratory effort.  No retractions. Lungs CTAB. Abdominal: Soft and nontender. No distention. No guarding no rebound Back:  There is no focal tenderness or step off there is no midline tenderness there are no lesions noted. there is no CVA tenderness Pelvic exam: Patient refuses Musculoskeletal: No lower extremity tenderness. No joint effusions, no DVT signs strong distal pulses no edema Neurologic:  Normal speech and language. No gross focal neurologic deficits are appreciated.  Skin:  Skin is warm, dry and intact. No rash noted. Psychiatric: Mood and affect are normal. Speech and behavior are normal.  ____________________________________________   LABS (all labs ordered are listed, but only abnormal results are displayed)  Labs Reviewed  CBC WITH DIFFERENTIAL/PLATELET - Abnormal; Notable for the following:    WBC 15.2 (*)    Neutro Abs 12.2 (*)    All other components within normal limits  URINALYSIS COMPLETEWITH MICROSCOPIC ([facility] ONLY) - Abnormal; Notable for the following:    Color, Urine STRAW (*)    APPearance CLEAR (*)    Specific Gravity, Urine 1.004 (*)    Hgb urine dipstick 1+ (*)    Bacteria, UA RARE (*)    Squamous Epithelial / LPF 6-30 (*)    All other components within normal limits  HCG, QUANTITATIVE, PREGNANCY  COMPREHENSIVE METABOLIC PANEL  POC URINE PREG, ED  POCT PREGNANCY, URINE   ____________________________________________  EKG  I personally interpreted any EKGs ordered by me or triage  ____________________________________________  RADIOLOGY  I reviewed any imaging ordered by me or triage that were performed during my  shift ____________________________________________   PROCEDURES  Procedure(s) performed: None  Critical Care performed: None  ____________________________________________   INITIAL IMPRESSION / ASSESSMENT AND PLAN / ED COURSE  Pertinent labs & imaging results that were available during my care of the patient were reviewed by me and considered in my medical decision making (see chart for details).  Patient here with nausea, vomiting and diarrhea now. Her abdomen is completely benign. Her exam is consistent with a viral illness. Because she did return to the emergency room with continued abdominal cramping and discomfort I did do a CT scan as a precaution as anticipated it is normal. I did offer her a pelvic exam and she declines and she understands his Mize cannot rule out PID although have low suspicion. Her white  count remains somewhat elevated but again this is most likely consistent with a viral process. It is a nonspecific finding and there is no question that she has some illness. Despite complaining of vomiting, patient has no evidence of significant dehydration, her ketones are negative or urine no evidence of UTI. No recent antibiotics or anything to make one suspect Clostridium difficile. Patient has not had significant diarrhea although she has had several loose stools today and is not watery. She states she cannot give me a stool sample. As noted, patient had a negative ultrasound yesterday,----------------------------------------- 11:32 PM on 06/04/2015 -----------------------------------------  At this time, patient states her pain is much better cramping is gone her nausea is gone she would like to try by mouth. We are waiting her BUN and creatinine  ----------------------------------------- 12:09 AM on 06/05/2015 -----------------------------------------  Awaiting labs, signed out to dr. Edd Fabian at the end of my shift.  ____________________________________________   FINAL  CLINICAL IMPRESSION(S) / ED DIAGNOSES  Final diagnoses:  None     Schuyler Amor, MD 06/04/15 2332  Schuyler Amor, MD 06/05/15 [phone]

## 2015-06-04 NOTE — ED Notes (Signed)
Pt seen here yesterday for nausea, vomiting, and abdominal pain. States she was given a prescription for Zofran, which she filled and has taken, but denies relief.

## 2015-06-05 LAB — COMPREHENSIVE METABOLIC PANEL
ALBUMIN: 4 g/dL (ref 3.5–5.0)
ALK PHOS: 70 U/L (ref 38–126)
ALT: 21 U/L (ref 14–54)
ANION GAP: 7 (ref 5–15)
AST: 21 U/L (ref 15–41)
BUN: 8 mg/dL (ref 6–20)
CALCIUM: 8.2 mg/dL — AB (ref 8.9–10.3)
CHLORIDE: 112 mmol/L — AB (ref 101–111)
CO2: 22 mmol/L (ref 22–32)
CREATININE: 0.6 mg/dL (ref 0.44–1.00)
GFR calc Af Amer: >60 mL/min (ref >60)
GFR calc non Af Amer: >60 mL/min (ref >60)
GLUCOSE: 95 mg/dL (ref 65–99)
Potassium: 3.6 mmol/L (ref 3.5–5.1)
SODIUM: 141 mmol/L (ref 135–145)
Total Bilirubin: 1 mg/dL (ref 0.3–1.2)
Total Protein: 7.1 g/dL (ref 6.5–8.1)

## 2015-06-05 LAB — LIPASE, BLOOD: Lipase: 27 U/L (ref 11–51)

## 2015-06-05 MED ORDER — PROMETHAZINE HCL 25 MG RE SUPP: 25.0000 mg | Freq: Four times a day (QID) | RECTAL | Status: DC | PRN

## 2015-06-05 NOTE — ED Provider Notes (Signed)
-----------------------------------------   1:17 AM on 06/05/2015 -----------------------------------------  Care was assumed from Dr. Burlene Arnt at 12:09 a.m. pending CMP and lipase which are generally unremarkable. At this time, the patient reports that she feels well. Her pain has resolved. She is tolerating by mouth intake. We discussed return precautions, need for close PCP and GI follow-up and she is comfortable with the discharge plan. Will DC home.  Joanne Gavel, MD 06/05/15 [phone]

## 2015-06-28 ENCOUNTER — Encounter: Payer: Self-pay | Admitting: Medical Oncology

## 2015-06-28 ENCOUNTER — Emergency Department
Admission: EM | Admit: 2015-06-28 | Discharge: 2015-06-28 | Disposition: A | Discharge status: Home / Self Care | Payer: Self-pay | Attending: Emergency Medicine | Admitting: Emergency Medicine

## 2015-06-28 DIAGNOSIS — R590 Localized enlarged lymph nodes: Secondary | ICD-10-CM | POA: Insufficient documentation

## 2015-06-28 DIAGNOSIS — F17200 Nicotine dependence, unspecified, uncomplicated: Secondary | ICD-10-CM | POA: Insufficient documentation

## 2015-06-28 MED ORDER — OXYCODONE-ACETAMINOPHEN 5-325 MG PO TABS: 2.0000 | ORAL_TABLET | Freq: Four times a day (QID) | ORAL | Status: DC | PRN

## 2015-06-28 MED ORDER — OXYCODONE-ACETAMINOPHEN 5-325 MG PO TABS
2.0000 | ORAL_TABLET | Freq: Once | ORAL | Status: AC
  Administered 2015-06-28: 2 via ORAL
  Filled 2015-06-28: qty 2

## 2015-06-28 NOTE — Discharge Instructions (Signed)

## 2015-06-28 NOTE — ED Provider Notes (Signed)
[facility] Emergency Department Provider Note     Time seen: ----------------------------------------- 9:32 AM on 06/28/2015 -----------------------------------------    I have reviewed the triage vital signs and the nursing notes.   HISTORY  Chief Complaint Abdominal Pain    HPI Jasmine Mason is a 29 y.o. female who presents ER with a not in her right lower abdomen for the last 3 days. Patient denies any fevers, chills, chest pain, shortness of breath, nausea vomiting or diarrhea. Patient states it feels like it is enlarging. She also denies vaginal bleeding or discharge.   Past Medical History  Diagnosis Date  . GERD (gastroesophageal reflux disease)   . Tobacco abuse   . Mesenteric adenitis   . Obesity     Patient Active Problem List   Diagnosis Date Noted  . Abdominal pain, epigastric   . GERD (gastroesophageal reflux disease) 01/24/2015  . Hypokalemia 01/24/2015  . Abdominal pain 01/24/2015  . Leukocytosis 01/24/2015  . Chest pain 01/24/2015  . Cough 01/24/2015  . Pain of left calf 01/24/2015  . Elevated troponin 01/24/2015  . Obesity   . Mesenteric adenitis   . Tobacco abuse     Past Surgical History  Procedure Laterality Date  . Orif ankle fracture Left 1990's  . Carpal tunnel release Left ~ 2013  . Fracture surgery    . Esophagogastroduodenoscopy N/A 01/25/2015    Procedure: ESOPHAGOGASTRODUODENOSCOPY (EGD);  Surgeon: Irene Shipper, MD;  Location: [facility] ENDOSCOPY;  Service: Endoscopy;  Laterality: N/A;    Allergies Review of patient's allergies indicates no known allergies.  Social History Social History  Substance Use Topics  . Smoking status: Current Every Day Smoker -- 0.25 packs/day for 10 years    Types: Cigarettes  . Smokeless tobacco: None  . Alcohol Use: No    Review of Systems Constitutional: Negative for fever. Eyes: Negative for visual changes. ENT: Negative for sore throat. Cardiovascular: Negative for  chest pain. Respiratory: Negative for shortness of breath. Gastrointestinal: Positive for right groin pain and swelling Genitourinary: Negative for dysuria. Musculoskeletal: Negative for back pain. Skin: Negative for rash. Neurological: Negative for headaches, focal weakness or numbness.  10-point ROS otherwise negative.  ____________________________________________   PHYSICAL EXAM:  VITAL SIGNS: ED Triage Vitals  Enc Vitals Group     BP 06/28/15 0928 136/91 mmHg     Pulse Rate 06/28/15 0926 84     Resp 06/28/15 0926 20     Temp 06/28/15 0926 98.6 F (37 C)     Temp Source 06/28/15 0926 Oral     SpO2 06/28/15 0926 100 %     Weight 06/28/15 0926 200 lb (90.719 kg)     Height 06/28/15 0926 5\' 7"  (1.702 m)     Head Cir --      Peak Flow --      Pain Score 06/28/15 0927 9     Pain Loc --      Pain Edu? --      Excl. in [place]? --     Constitutional: Alert and oriented. Well appearing and in no distress. Gastrointestinal: Patient has a small cystlike structure that approximately 2 cm x 1 cm in the right inguinal area. Uncertain etiology, does not seem to be a hernia. It is tender to touch but overlying skin is normal.  Musculoskeletal: Nontender with normal range of motion in all extremities. No joint effusions.  No lower extremity tenderness nor edema. No lesions are appreciated in the right lower extremity. Neurologic:  Normal speech and language. No gross focal neurologic deficits are appreciated. Speech is normal. No gait instability. Skin:  Skin is warm, dry and intact. No rash noted. Psychiatric: Mood and affect are normal. Speech and behavior are normal. Patient exhibits appropriate insight and judgment. ____________________________________________  ED COURSE:  Pertinent labs & imaging results that were available during my care of the patient were reviewed by me and considered in my medical decision making (see chart for details). Patient with an enlarged right inguinal  lymph node of uncertain etiology. She will be referred to follow up with her doctor for reevaluation. ____________________________________________  FINAL ASSESSMENT AND PLAN  Adenopathy  Plan: Patient with labs and imaging as dictated above. Patient is in no acute distress, she'll be encouraged to follow-up with her doctor in 2 days for recheck.   Earleen Newport, MD   Earleen Newport, MD 06/28/15 [phone]

## 2015-06-28 NOTE — ED Notes (Signed)
Pt reports "knot" to left lower abd x 3 days. Denies n/v/d.

## 2015-07-22 ENCOUNTER — Emergency Department
Admission: EM | Admit: 2015-07-22 | Discharge: 2015-07-22 | Disposition: A | Discharge status: Home / Self Care | Payer: Self-pay | Attending: Emergency Medicine | Admitting: Emergency Medicine

## 2015-07-22 ENCOUNTER — Encounter: Payer: Self-pay | Admitting: Medical Oncology

## 2015-07-22 DIAGNOSIS — F17210 Nicotine dependence, cigarettes, uncomplicated: Secondary | ICD-10-CM | POA: Insufficient documentation

## 2015-07-22 DIAGNOSIS — K0381 Cracked tooth: Secondary | ICD-10-CM | POA: Insufficient documentation

## 2015-07-22 DIAGNOSIS — K047 Periapical abscess without sinus: Secondary | ICD-10-CM | POA: Insufficient documentation

## 2015-07-22 DIAGNOSIS — K0889 Other specified disorders of teeth and supporting structures: Secondary | ICD-10-CM | POA: Insufficient documentation

## 2015-07-22 DIAGNOSIS — Z79899 Other long term (current) drug therapy: Secondary | ICD-10-CM | POA: Insufficient documentation

## 2015-07-22 MED ORDER — AMOXICILLIN 500 MG PO TABS: 500.0000 mg | ORAL_TABLET | Freq: Three times a day (TID) | ORAL | Status: DC

## 2015-07-22 MED ORDER — TRAMADOL HCL 50 MG PO TABS: 50.0000 mg | ORAL_TABLET | Freq: Four times a day (QID) | ORAL | Status: DC | PRN

## 2015-07-22 NOTE — ED Provider Notes (Signed)
[facility] Emergency Department Provider Note ____________________________________________  Time seen: Approximately 4:42 PM  I have reviewed the triage vital signs and the nursing notes.   HISTORY  Chief Complaint Dental Pain   HPI Jasmine Mason is a 29 y.o. female who presents to the emergency department for evaluation of dental pain. She has a chronic dental fracture and now has an abscess in the gum line. She states that she called her dentist office today, but they were unable to fit her in. She states that she has an appointment next week and was advised to come to the emergency department for evaluation and given a prescription for antibiotics if needed. She is had no relief of pain with over-the-counter medications.   Past Medical History  Diagnosis Date  . GERD (gastroesophageal reflux disease)   . Tobacco abuse   . Mesenteric adenitis   . Obesity     Patient Active Problem List   Diagnosis Date Noted  . Abdominal pain, epigastric   . GERD (gastroesophageal reflux disease) 01/24/2015  . Hypokalemia 01/24/2015  . Abdominal pain 01/24/2015  . Leukocytosis 01/24/2015  . Chest pain 01/24/2015  . Cough 01/24/2015  . Pain of left calf 01/24/2015  . Elevated troponin 01/24/2015  . Obesity   . Mesenteric adenitis   . Tobacco abuse     Past Surgical History  Procedure Laterality Date  . Orif ankle fracture Left 1990's  . Carpal tunnel release Left ~ 2013  . Fracture surgery    . Esophagogastroduodenoscopy N/A 01/25/2015    Procedure: ESOPHAGOGASTRODUODENOSCOPY (EGD);  Surgeon: Irene Shipper, MD;  Location: [facility] ENDOSCOPY;  Service: Endoscopy;  Laterality: N/A;    Current Outpatient Rx  Name  Route  Sig  Dispense  Refill  . amoxicillin (AMOXIL) 500 MG tablet   Oral   Take 1 tablet (500 mg total) by mouth 3 (three) times daily.   30 tablet   0   . pantoprazole (PROTONIX) 40 MG tablet   Oral   Take 1 tablet (40 mg total) by mouth 2 (two)  times daily before a meal. 1 tablet 2 times daily 2 weeks, then 1 tablet daily.   60 tablet   0   . promethazine (PHENERGAN) 25 MG suppository   Rectal   Place 1 suppository (25 mg total) rectally every 6 (six) hours as needed for nausea.   12 suppository   1   . sucralfate (CARAFATE) 1 G tablet   Oral   Take 1 tablet (1 g total) by mouth 4 (four) times daily.   28 tablet   1   . traMADol (ULTRAM) 50 MG tablet   Oral   Take 1 tablet (50 mg total) by mouth every 6 (six) hours as needed.   9 tablet   0     Allergies Review of patient's allergies indicates no known allergies.  Family History  Problem Relation Age of Onset  . Hypertension Mother   . Hypertension Father   . Hypertension Sister   . Diabetes Father   . Heart disease Father     Social History Social History  Substance Use Topics  . Smoking status: Current Every Day Smoker -- 0.25 packs/day for 10 years    Types: Cigarettes  . Smokeless tobacco: None  . Alcohol Use: No    Review of Systems Constitutional: No fever/chills Eyes: No visual changes. ENT: No sore throat. Cardiovascular: Denies chest pain. Respiratory: Denies shortness of breath. Gastrointestinal: No abdominal pain.  No nausea, no vomiting.  Genitourinary: Negative for dysuria. Musculoskeletal: Negative for back pain. Skin: Negative for rash. Neurological: Negative for headaches, focal weakness or numbness. 10-point ROS otherwise negative.  ____________________________________________   PHYSICAL EXAM:  VITAL SIGNS: ED Triage Vitals  Enc Vitals Group     BP 07/22/15 1619 139/85 mmHg     Pulse Rate 07/22/15 1619 97     Resp 07/22/15 1619 18     Temp 07/22/15 1619 98.3 F (36.8 C)     Temp Source 07/22/15 1619 Oral     SpO2 07/22/15 1619 99 %     Weight 07/22/15 1619 200 lb (90.719 kg)     Height 07/22/15 1619 5\' 6"  (1.676 m)     Head Cir --      Peak Flow --      Pain Score 07/22/15 1622 9     Pain Loc --      Pain Edu?  --      Excl. in [place]? --     Constitutional: Alert and oriented. Well appearing and in no acute distress. Eyes: Conjunctivae are normal. PERRL. EOMI. Head: Atraumatic. Nose: No congestion/rhinnorhea. Mouth/Throat: Mucous membranes are moist.  Oropharynx non-erythematous. Periodontal Exam    Neck: No stridor.  Hematological/Lymphatic/Immunilogical: No cervical lymphadenopathy. Cardiovascular:   Good peripheral circulation. Respiratory: Normal respiratory effort.  No retractions. Musculoskeletal: No lower extremity tenderness nor edema.  No joint effusions. Neurologic:  Normal speech and language. No gross focal neurologic deficits are appreciated. Speech is normal. No gait instability. Skin:  Skin is warm, dry and intact. No rash noted. Psychiatric: Mood and affect are normal. Speech and behavior are normal.  ____________________________________________   LABS (all labs ordered are listed, but only abnormal results are displayed)  Labs Reviewed - No data to display ____________________________________________   RADIOLOGY   ____________________________________________   PROCEDURES  Procedure(s) performed: None  Critical Care performed: No  ____________________________________________   INITIAL IMPRESSION / ASSESSMENT AND PLAN / ED COURSE  Pertinent labs & imaging results that were available during my care of the patient were reviewed by me and considered in my medical decision making (see chart for details).  Patient was advised to see the dentist within 14 days. Also advised to take the antibiotic until finished. Instructed to return to the ER for symptoms that change or worsen if unable to schedule an appointment. ____________________________________________   FINAL CLINICAL IMPRESSION(S) / ED DIAGNOSES  Final diagnoses:  Pain, dental       Victorino Dike, FNP 07/22/15 1647  Nance Pear, MD 07/22/15 [phone]

## 2015-07-22 NOTE — ED Notes (Signed)
Pt c/o abscess to upper gumline.

## 2015-11-04 ENCOUNTER — Encounter: Payer: Self-pay | Admitting: Emergency Medicine

## 2015-11-04 ENCOUNTER — Emergency Department
Admission: EM | Admit: 2015-11-04 | Discharge: 2015-11-04 | Disposition: A | Discharge status: Home / Self Care | Payer: No Typology Code available for payment source | Attending: Emergency Medicine | Admitting: Emergency Medicine

## 2015-11-04 DIAGNOSIS — E6609 Other obesity due to excess calories: Secondary | ICD-10-CM | POA: Insufficient documentation

## 2015-11-04 DIAGNOSIS — S29012A Strain of muscle and tendon of back wall of thorax, initial encounter: Secondary | ICD-10-CM

## 2015-11-04 DIAGNOSIS — Y999 Unspecified external cause status: Secondary | ICD-10-CM | POA: Insufficient documentation

## 2015-11-04 DIAGNOSIS — K219 Gastro-esophageal reflux disease without esophagitis: Secondary | ICD-10-CM | POA: Insufficient documentation

## 2015-11-04 DIAGNOSIS — X500XXA Overexertion from strenuous movement or load, initial encounter: Secondary | ICD-10-CM | POA: Insufficient documentation

## 2015-11-04 DIAGNOSIS — S39012A Strain of muscle, fascia and tendon of lower back, initial encounter: Secondary | ICD-10-CM | POA: Insufficient documentation

## 2015-11-04 DIAGNOSIS — D72829 Elevated white blood cell count, unspecified: Secondary | ICD-10-CM | POA: Insufficient documentation

## 2015-11-04 DIAGNOSIS — Y9289 Other specified places as the place of occurrence of the external cause: Secondary | ICD-10-CM | POA: Insufficient documentation

## 2015-11-04 DIAGNOSIS — S29019A Strain of muscle and tendon of unspecified wall of thorax, initial encounter: Secondary | ICD-10-CM | POA: Insufficient documentation

## 2015-11-04 DIAGNOSIS — F17210 Nicotine dependence, cigarettes, uncomplicated: Secondary | ICD-10-CM | POA: Insufficient documentation

## 2015-11-04 DIAGNOSIS — Y9389 Activity, other specified: Secondary | ICD-10-CM | POA: Insufficient documentation

## 2015-11-04 MED ORDER — METHOCARBAMOL 500 MG PO TABS
1000.0000 mg | ORAL_TABLET | Freq: Once | ORAL | Status: AC
  Administered 2015-11-04: 1000 mg via ORAL
  Filled 2015-11-04: qty 2

## 2015-11-04 MED ORDER — NAPROXEN 500 MG PO TABS
500.0000 mg | ORAL_TABLET | Freq: Once | ORAL | Status: AC
  Administered 2015-11-04: 500 mg via ORAL
  Filled 2015-11-04: qty 1

## 2015-11-04 MED ORDER — NAPROXEN 500 MG PO TABS: 500.0000 mg | ORAL_TABLET | Freq: Two times a day (BID) | ORAL | Status: DC

## 2015-11-04 MED ORDER — METHOCARBAMOL 500 MG PO TABS: 500.0000 mg | ORAL_TABLET | Freq: Four times a day (QID) | ORAL | Status: DC

## 2015-11-04 NOTE — ED Notes (Signed)
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 Pt c/o of mid/lower right back pain radiating down right leg to just above right knee. Pt rates pain at 10 out of 10, and describes pain as sharp/shooting/numbness/tingling.

## 2015-11-04 NOTE — ED Notes (Signed)
Pt also complains of stiffness/soreness pain of right shoulder blade

## 2015-11-04 NOTE — ED Notes (Signed)
Reviewed d/c instructions, follow-up care, prescriptions, use of ice, and safe lifting/movements. Pt verbalized understanding.

## 2015-11-04 NOTE — Discharge Instructions (Signed)

## 2015-11-04 NOTE — ED Notes (Signed)
Patient reports severe back pain since this am.

## 2015-11-04 NOTE — ED Provider Notes (Signed)
[facility] Emergency Department Provider Note  ____________________________________________  Time seen: Approximately 8:50 PM  I have reviewed the triage vital signs and the nursing notes.   HISTORY  Chief Complaint Back Pain    HPI Jasmine Mason is a 30 y.o. female who presents emergency department complaining of back pain. Patient states that she was at work trying to move a 400 pound plus patient off the floor. She states that she felt a tightening straining sensation in her back. Since then she has had sharp pains on bilateral aspects of her back. She states that there is occasional radiation down to her right leg. Patient denies any falls or other trauma to her spine. She denies any bowel or bladder dysfunction, paresthesias, saddle anesthesia.   Past Medical History  Diagnosis Date  . GERD (gastroesophageal reflux disease)   . Tobacco abuse   . Mesenteric adenitis   . Obesity     Patient Active Problem List   Diagnosis Date Noted  . Abdominal pain, epigastric   . GERD (gastroesophageal reflux disease) 01/24/2015  . Hypokalemia 01/24/2015  . Abdominal pain 01/24/2015  . Leukocytosis 01/24/2015  . Chest pain 01/24/2015  . Cough 01/24/2015  . Pain of left calf 01/24/2015  . Elevated troponin 01/24/2015  . Obesity   . Mesenteric adenitis   . Tobacco abuse     Past Surgical History  Procedure Laterality Date  . Orif ankle fracture Left 1990's  . Carpal tunnel release Left ~ 2013  . Fracture surgery    . Esophagogastroduodenoscopy N/A 01/25/2015    Procedure: ESOPHAGOGASTRODUODENOSCOPY (EGD);  Surgeon: Irene Shipper, MD;  Location: [facility] ENDOSCOPY;  Service: Endoscopy;  Laterality: N/A;    Current Outpatient Rx  Name  Route  Sig  Dispense  Refill  . ranitidine (ZANTAC) 150 MG capsule   Oral   Take 150 mg by mouth every morning.         Marland Kitchen amoxicillin (AMOXIL) 500 MG tablet   Oral   Take 1 tablet (500 mg total) by mouth 3 (three) times  daily.   30 tablet   0   . methocarbamol (ROBAXIN) 500 MG tablet   Oral   Take 1 tablet (500 mg total) by mouth 4 (four) times daily.   16 tablet   0   . naproxen (NAPROSYN) 500 MG tablet   Oral   Take 1 tablet (500 mg total) by mouth 2 (two) times daily with a meal.   60 tablet   0   . pantoprazole (PROTONIX) 40 MG tablet   Oral   Take 1 tablet (40 mg total) by mouth 2 (two) times daily before a meal. 1 tablet 2 times daily 2 weeks, then 1 tablet daily.   60 tablet   0   . promethazine (PHENERGAN) 25 MG suppository   Rectal   Place 1 suppository (25 mg total) rectally every 6 (six) hours as needed for nausea.   12 suppository   1   . sucralfate (CARAFATE) 1 G tablet   Oral   Take 1 tablet (1 g total) by mouth 4 (four) times daily.   28 tablet   1   . traMADol (ULTRAM) 50 MG tablet   Oral   Take 1 tablet (50 mg total) by mouth every 6 (six) hours as needed.   9 tablet   0     Allergies Review of patient's allergies indicates no known allergies.  Family History  Problem Relation Age of Onset  .  Hypertension Mother   . Hypertension Father   . Hypertension Sister   . Diabetes Father   . Heart disease Father     Social History Social History  Substance Use Topics  . Smoking status: Current Every Day Smoker -- 0.25 packs/day for 10 years    Types: Cigarettes  . Smokeless tobacco: None  . Alcohol Use: No     Review of Systems  Constitutional: No fever/chills Cardiovascular: no chest pain. Respiratory: no cough. No SOB. Musculoskeletal: Positive for back pain. Skin: Negative for rash. Neurological: Negative for headaches, focal weakness or numbness. 10-point ROS otherwise negative.  ____________________________________________   PHYSICAL EXAM:  VITAL SIGNS: ED Triage Vitals  Enc Vitals Group     BP 11/04/15 1914 139/97 mmHg     Pulse Rate 11/04/15 1914 96     Resp 11/04/15 1914 20     Temp 11/04/15 1914 98.7 F (37.1 C)     Temp Source  11/04/15 1914 Oral     SpO2 11/04/15 1914 100 %     Weight 11/04/15 1914 200 lb (90.719 kg)     Height 11/04/15 1914 5' 6.5" (1.689 m)     Head Cir --      Peak Flow --      Pain Score 11/04/15 1915 10     Pain Loc --      Pain Edu? --      Excl. in [place]? --      Constitutional: Alert and oriented. Well appearing and in no acute distress. Cardiovascular: Normal rate, regular rhythm. Normal S1 and S2.  Good peripheral circulation. Respiratory: Normal respiratory effort without tachypnea or retractions. Lungs CTAB. Musculoskeletal: No visible deformity to spine upon inspection. Full range of motion to spine. Patient is nontender to palpation over midline spinal processes. Patient is diffusely tender to palpation over thoracic and lumbar paraspinal muscle groups. Spasms are noted. Patient is nontender to palpation over sciatic notches. Negative straight leg raise bilaterally. Dorsalis pedis pulses appreciated bilaterally lower extremities. Sensation intact and equal lower extremities. Neurologic:  Normal speech and language. No gross focal neurologic deficits are appreciated.  Skin:  Skin is warm, dry and intact. No rash noted. Psychiatric: Mood and affect are normal. Speech and behavior are normal. Patient exhibits appropriate insight and judgement.   ____________________________________________   LABS (all labs ordered are listed, but only abnormal results are displayed)  Labs Reviewed - No data to display ____________________________________________  EKG   ____________________________________________  RADIOLOGY   No results found.  ____________________________________________    PROCEDURES  Procedure(s) performed:       Medications  naproxen (NAPROSYN) tablet 500 mg (not administered)  methocarbamol (ROBAXIN) tablet 1,000 mg (not administered)     ____________________________________________   INITIAL IMPRESSION / ASSESSMENT AND PLAN / ED COURSE  Pertinent  labs & imaging results that were available during my care of the patient were reviewed by me and considered in my medical decision making (see chart for details).  Patient's diagnosis is consistent with MRI and thoracic paraspinal muscle strains. Exam is reassuring. No need for imaging at this time.. Patient will be discharged home with prescriptions for anti-inflammatories and muscle relaxers. Patient is to follow up with primary care or orthopedics if symptoms persist past this treatment course. Patient is given ED precautions to return to the ED for any worsening or new symptoms.     ____________________________________________  FINAL CLINICAL IMPRESSION(S) / ED DIAGNOSES  Final diagnoses:  Strain of lumbar paraspinal muscle, initial  encounter  Strain of thoracic paraspinal muscles excluding T1 and T2 levels, initial encounter      NEW MEDICATIONS STARTED DURING THIS VISIT:  New Prescriptions   METHOCARBAMOL (ROBAXIN) 500 MG TABLET    Take 1 tablet (500 mg total) by mouth 4 (four) times daily.   NAPROXEN (NAPROSYN) 500 MG TABLET    Take 1 tablet (500 mg total) by mouth 2 (two) times daily with a meal.        This chart was dictated using voice recognition software/Dragon. Despite best efforts to proofread, errors can occur which can change the meaning. Any change was purely unintentional.    Darletta Moll, PA-C 11/04/15 2056  Orbie Pyo, MD 11/04/15 [phone]

## 2015-11-04 NOTE — ED Notes (Signed)
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

## 2015-11-06 ENCOUNTER — Emergency Department
Admission: EM | Admit: 2015-11-06 | Discharge: 2015-11-06 | Disposition: A | Discharge status: Home / Self Care | Payer: Worker's Compensation | Attending: Emergency Medicine | Admitting: Emergency Medicine

## 2015-11-06 ENCOUNTER — Encounter: Payer: Self-pay | Admitting: Emergency Medicine

## 2015-11-06 ENCOUNTER — Emergency Department: Payer: Self-pay

## 2015-11-06 DIAGNOSIS — S161XXA Strain of muscle, fascia and tendon at neck level, initial encounter: Secondary | ICD-10-CM | POA: Insufficient documentation

## 2015-11-06 DIAGNOSIS — E669 Obesity, unspecified: Secondary | ICD-10-CM | POA: Insufficient documentation

## 2015-11-06 DIAGNOSIS — Y939 Activity, unspecified: Secondary | ICD-10-CM | POA: Insufficient documentation

## 2015-11-06 DIAGNOSIS — S39012D Strain of muscle, fascia and tendon of lower back, subsequent encounter: Secondary | ICD-10-CM

## 2015-11-06 DIAGNOSIS — F17210 Nicotine dependence, cigarettes, uncomplicated: Secondary | ICD-10-CM | POA: Insufficient documentation

## 2015-11-06 DIAGNOSIS — S161XXD Strain of muscle, fascia and tendon at neck level, subsequent encounter: Secondary | ICD-10-CM

## 2015-11-06 DIAGNOSIS — Y990 Civilian activity done for income or pay: Secondary | ICD-10-CM | POA: Insufficient documentation

## 2015-11-06 DIAGNOSIS — S39012A Strain of muscle, fascia and tendon of lower back, initial encounter: Secondary | ICD-10-CM | POA: Insufficient documentation

## 2015-11-06 DIAGNOSIS — X58XXXA Exposure to other specified factors, initial encounter: Secondary | ICD-10-CM | POA: Insufficient documentation

## 2015-11-06 DIAGNOSIS — Y929 Unspecified place or not applicable: Secondary | ICD-10-CM | POA: Insufficient documentation

## 2015-11-06 LAB — POCT PREGNANCY, URINE: PREG TEST UR: NEGATIVE

## 2015-11-06 MED ORDER — OXYCODONE-ACETAMINOPHEN 5-325 MG PO TABS
1.0000 | ORAL_TABLET | Freq: Once | ORAL | Status: AC
  Administered 2015-11-06: 1 via ORAL
  Filled 2015-11-06: qty 1

## 2015-11-06 MED ORDER — OXYCODONE-ACETAMINOPHEN 5-325 MG PO TABS: 1.0000 | ORAL_TABLET | Freq: Four times a day (QID) | ORAL | Status: DC | PRN

## 2015-11-06 MED ORDER — PREDNISONE 10 MG (21) PO TBPK: ORAL_TABLET | ORAL | Status: DC

## 2015-11-06 NOTE — ED Notes (Signed)
C/O mid to low back pain.  Onset of symptoms 4/14 after an accident at work.  Patient was seen in ED after accident and has been taking medication for pain (Robaxin and Naproxsyn), meds not helping.

## 2015-11-06 NOTE — ED Notes (Signed)
Was seen about 2 days ago with back pain ...states she was helping another worker with a patient  States pain remains in lower back and moves into right leg min relief with meds that were rx'd .Marland Kitchen Also now having some abd pain which maybe caused by the meds

## 2015-11-06 NOTE — Discharge Instructions (Signed)
Continue pain medicine as directed. You can follow-up with the urgent care next door, or the preferred provider of your employer. Return to the emergency room for worsening symptoms. As pain improves, begin exercises as seen in the handout.

## 2015-11-06 NOTE — ED Provider Notes (Signed)
[facility] Emergency Department Provider Note  ____________________________________________  Time seen: Approximately 1:56 PM  I have reviewed the triage vital signs and the nursing notes.   HISTORY  Chief Complaint Back Pain    HPI Jasmine Mason is a 30 y.o. female who was seen here 2 days ago for the same complaint. 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 She has not had any improvement with anti-inflammatory muscle relaxant. Continues to have right-sided low back pain with some radiation down the right leg. Some numbness and tingling. Also pain up into the right neck region. She denies abdominal pain but does have sluggish bowels. No urinary symptoms. No fevers or chills.   Past Medical History  Diagnosis Date  . GERD (gastroesophageal reflux disease)   . Tobacco abuse   . Mesenteric adenitis   . Obesity     Patient Active Problem List   Diagnosis Date Noted  . Abdominal pain, epigastric   . GERD (gastroesophageal reflux disease) 01/24/2015  . Hypokalemia 01/24/2015  . Abdominal pain 01/24/2015  . Leukocytosis 01/24/2015  . Chest pain 01/24/2015  . Cough 01/24/2015  . Pain of left calf 01/24/2015  . Elevated troponin 01/24/2015  . Obesity   . Mesenteric adenitis   . Tobacco abuse     Past Surgical History  Procedure Laterality Date  . Orif ankle fracture Left 1990's  . Carpal tunnel release Left ~ 2013  . Fracture surgery    . Esophagogastroduodenoscopy N/A 01/25/2015    Procedure: ESOPHAGOGASTRODUODENOSCOPY (EGD);  Surgeon: Irene Shipper, MD;  Location: [facility] ENDOSCOPY;  Service: Endoscopy;  Laterality: N/A;    Current Outpatient Rx  Name  Route  Sig  Dispense  Refill  . amoxicillin (AMOXIL) 500 MG tablet   Oral   Take 1 tablet (500 mg total) by mouth 3 (three) times daily.   30 tablet   0   . methocarbamol (ROBAXIN) 500 MG tablet   Oral   Take 1 tablet (500 mg total) by mouth 4 (four) times  daily.   16 tablet   0   . naproxen (NAPROSYN) 500 MG tablet   Oral   Take 1 tablet (500 mg total) by mouth 2 (two) times daily with a meal.   60 tablet   0   . oxyCODONE-acetaminophen (ROXICET) 5-325 MG tablet   Oral   Take 1 tablet by mouth every 6 (six) hours as needed.   20 tablet   0   . pantoprazole (PROTONIX) 40 MG tablet   Oral   Take 1 tablet (40 mg total) by mouth 2 (two) times daily before a meal. 1 tablet 2 times daily 2 weeks, then 1 tablet daily.   60 tablet   0   . predniSONE (STERAPRED UNI-PAK 21 TAB) 10 MG (21) TBPK tablet      6 tablets on day 1, 5 tablets on day 2, 4 tablets on day 3, etc...   21 tablet   0   . promethazine (PHENERGAN) 25 MG suppository   Rectal   Place 1 suppository (25 mg total) rectally every 6 (six) hours as needed for nausea.   12 suppository   1   . ranitidine (ZANTAC) 150 MG capsule   Oral   Take 150 mg by mouth every morning.         . sucralfate (CARAFATE) 1 G tablet   Oral   Take 1 tablet (1 g total) by mouth 4 (four)  times daily.   28 tablet   1   . traMADol (ULTRAM) 50 MG tablet   Oral   Take 1 tablet (50 mg total) by mouth every 6 (six) hours as needed.   9 tablet   0     Allergies Review of patient's allergies indicates no known allergies.  Family History  Problem Relation Age of Onset  . Hypertension Mother   . Hypertension Father   . Hypertension Sister   . Diabetes Father   . Heart disease Father     Social History Social History  Substance Use Topics  . Smoking status: Current Every Day Smoker -- 0.25 packs/day for 10 years    Types: Cigarettes  . Smokeless tobacco: None  . Alcohol Use: No    Review of Systems Constitutional: No fever/chills Eyes: No visual changes. ENT: No sore throat. Cardiovascular: Denies chest pain. Respiratory: Denies shortness of breath. Gastrointestinal: No abdominal pain.  No nausea, no vomiting.   constipation. Genitourinary: Negative for  dysuria. Musculoskeletal: Per history of present illness Skin: Negative for rash. Neurological: Negative for headaches, focal weakness or numbness. 10-point ROS otherwise negative.  ____________________________________________   PHYSICAL EXAM:  VITAL SIGNS: ED Triage Vitals  Enc Vitals Group     BP 11/06/15 1207 170/101 mmHg     Pulse Rate 11/06/15 1207 99     Resp 11/06/15 1207 16     Temp 11/06/15 1207 98.5 F (36.9 C)     Temp Source 11/06/15 1207 Oral     SpO2 11/06/15 1207 97 %     Weight 11/06/15 1207 212 lb (96.163 kg)     Height 11/06/15 1207 5\' 7"  (1.702 m)     Head Cir --      Peak Flow --      Pain Score 11/06/15 1208 10     Pain Loc --      Pain Edu? --      Excl. in [place]? --     Constitutional: Alert and oriented. Well appearing and in no acute distress. Eyes: Conjunctivae are normal. PERRL. EOMI. Ears:  Clear with normal landmarks. No erythema. Head: Atraumatic. Nose: No congestion/rhinnorhea. Mouth/Throat: Mucous membranes are moist.  Oropharynx non-erythematous. No lesions. Neck:  Supple.  No adenopathy.  She has right paracervical tenderness. Cardiovascular: Normal rate, regular rhythm. Grossly normal heart sounds.  Good peripheral circulation. Respiratory: Normal respiratory effort.  No retractions. Lungs CTAB. Gastrointestinal: Soft and nontender. No distention. No abdominal bruits. No CVA tenderness. Musculoskeletal: She has right paralumbar tenderness.   tender over the lumbar spine and paraspinal muscles.  rom intact.  positive SLR on right. nontender over the greater trochanter bilateral.  Neurologic:  Normal speech and language. No gross focal neurologic deficits are appreciated. No gait instability. Skin:  Skin is warm, dry and intact. No rash noted. Psychiatric: Mood and affect are normal. Speech and behavior are normal.  ____________________________________________   LABS (all labs ordered are listed, but only abnormal results are  displayed)  Labs Reviewed  POCT PREGNANCY, URINE   ____________________________________________  EKG   ____________________________________________  RADIOLOGY  FINDINGS: Cervical Spine:  Cervical elements from the level of the C1-T1 maintain alignment, without subluxation.  No acute fracture line identified.  Unremarkable appearance of the craniocervical junction.  Vertebral body heights relatively maintained.  Mild disc space narrowing throughout the cervical spine. Anterior osteophyte production present, worst at the C4-C5 and C5-C6 levels.  Mild bilateral facet disease.  Open mouth odontoid view unremarkable.  Prevertebral  soft tissues within normal limits.  IMPRESSION: Negative for acute fracture or malalignment of the cervical spine.  Signed,  Dulcy Fanny. Earleen Newport, DO  [facility]  [facility]   Electronically Signed  By: Corrie Mckusick D.O.  On: 11/06/2015 14:50  CLINICAL DATA: Neck stiffness and lower back pain that radiates down right leg since 4/14 when she injured herself while moving a persona at work  EXAM: [facility] - 2-3 VIEW  COMPARISON: CT of 01/22/2015  FINDINGS: Five lumbar type vertebral bodies. Sacroiliac joints are symmetric. Mild loss of intervertebral disc height at the lumbosacral junction. Facet arthropathy at this level, primarily right-sided. Maintenance of vertebral body height and alignment.  IMPRESSION: Lumbosacral junction spondylosis. No acute osseous abnormality.   Electronically Signed  By: Abigail Miyamoto M.D.  On: 11/06/2015 14:50  ____________________________________________   PROCEDURES  Procedure(s) performed: None  Critical Care performed: No  ____________________________________________   INITIAL IMPRESSION / ASSESSMENT AND PLAN / ED COURSE  Pertinent labs & imaging results that were available during my care of the  patient were reviewed by me and considered in my medical decision making (see chart for details).  30 year old who presented for a second visit after being seen on 11/04/15 for acute back strain. She was not improving on current treatment. X-rays obtained today of the lumbar and cervical spine and were within normal limits. She is treated more aggressively with prednisone taper and Percocet as needed. She is given a work note for today and tomorrow. She is encouraged to have close follow-up for further evaluation. She can continue NSAIDs and muscle relaxant when necessary ____________________________________________   FINAL CLINICAL IMPRESSION(S) / ED DIAGNOSES  Final diagnoses:  Cervical strain, subsequent encounter  Lumbar strain, subsequent encounter      Mortimer Fries, PA-C 11/06/15 1531  Lavonia Drafts, MD 11/06/15 1549

## 2015-11-13 ENCOUNTER — Emergency Department
Admission: EM | Admit: 2015-11-13 | Discharge: 2015-11-13 | Disposition: A | Discharge status: Home / Self Care | Payer: Self-pay | Attending: Emergency Medicine | Admitting: Emergency Medicine

## 2015-11-13 ENCOUNTER — Encounter: Payer: Self-pay | Admitting: *Deleted

## 2015-11-13 DIAGNOSIS — Z79899 Other long term (current) drug therapy: Secondary | ICD-10-CM | POA: Insufficient documentation

## 2015-11-13 DIAGNOSIS — E669 Obesity, unspecified: Secondary | ICD-10-CM | POA: Insufficient documentation

## 2015-11-13 DIAGNOSIS — F17210 Nicotine dependence, cigarettes, uncomplicated: Secondary | ICD-10-CM | POA: Insufficient documentation

## 2015-11-13 DIAGNOSIS — M5431 Sciatica, right side: Secondary | ICD-10-CM | POA: Insufficient documentation

## 2015-11-13 HISTORY — DX: Dorsalgia, unspecified: M54.9

## 2015-11-13 MED ORDER — LORAZEPAM 1 MG PO TABS: 1.0000 mg | ORAL_TABLET | Freq: Three times a day (TID) | ORAL | Status: DC | PRN

## 2015-11-13 MED ORDER — LIDOCAINE 5 % EX PTCH
1.0000 | MEDICATED_PATCH | CUTANEOUS | Status: DC
  Administered 2015-11-13: 1 via TRANSDERMAL
  Filled 2015-11-13: qty 1

## 2015-11-13 MED ORDER — LIDOCAINE 5 % EX PTCH: 1.0000 | MEDICATED_PATCH | Freq: Two times a day (BID) | CUTANEOUS | Status: DC

## 2015-11-13 MED ORDER — KETOROLAC TROMETHAMINE 30 MG/ML IJ SOLN
30.0000 mg | Freq: Once | INTRAMUSCULAR | Status: AC
  Administered 2015-11-13: 30 mg via INTRAMUSCULAR
  Filled 2015-11-13: qty 1

## 2015-11-13 NOTE — ED Notes (Signed)
Patient was seen on 4/14 for a back injury that occurred at work. Patient states she still has low back pain that radiates down right leg. Patient has a referral to an orthopedist. Patient states she has had a headache since 0700 this morning in right temporal lobe. Patient c/o blurry vision and some nausea.

## 2015-11-13 NOTE — Discharge Instructions (Signed)
Sciatica °Sciatica is pain, weakness, numbness, or tingling along the path of the sciatic nerve. The nerve starts in the lower back and runs down the back of each leg. The nerve controls the muscles in the lower leg and in the back of the knee, while also providing sensation to the back of the thigh, lower leg, and the sole of your foot. Sciatica is a symptom of another medical condition. For instance, nerve damage or certain conditions, such as a herniated disk or bone spur on the spine, pinch or put pressure on the sciatic nerve. This causes the pain, weakness, or other sensations normally associated with sciatica. Generally, sciatica only affects one side of the body. °CAUSES  °· Herniated or slipped disc. °· Degenerative disk disease. °· A pain disorder involving the narrow muscle in the buttocks (piriformis syndrome). °· Pelvic injury or fracture. °· Pregnancy. °· Tumor (rare). °SYMPTOMS  °Symptoms can vary from mild to very severe. The symptoms usually travel from the low back to the buttocks and down the back of the leg. Symptoms can include: °· Mild tingling or dull aches in the lower back, leg, or hip. °· Numbness in the back of the calf or sole of the foot. °· Burning sensations in the lower back, leg, or hip. °· Sharp pains in the lower back, leg, or hip. °· Leg weakness. °· Severe back pain inhibiting movement. °These symptoms may get worse with coughing, sneezing, laughing, or prolonged sitting or standing. Also, being overweight may worsen symptoms. °DIAGNOSIS  °Your caregiver will perform a physical exam to look for common symptoms of sciatica. He or she may ask you to do certain movements or activities that would trigger sciatic nerve pain. Other tests may be performed to find the cause of the sciatica. These may include: °· Blood tests. °· X-rays. °· Imaging tests, such as an MRI or CT scan. °TREATMENT  °Treatment is directed at the cause of the sciatic pain. Sometimes, treatment is not necessary  and the pain and discomfort goes away on its own. If treatment is needed, your caregiver may suggest: °· Over-the-counter medicines to relieve pain. °· Prescription medicines, such as anti-inflammatory medicine, muscle relaxants, or narcotics. °· Applying heat or ice to the painful area. °· Steroid injections to lessen pain, irritation, and inflammation around the nerve. °· Reducing activity during periods of pain. °· Exercising and stretching to strengthen your abdomen and improve flexibility of your spine. Your caregiver may suggest losing weight if the extra weight makes the back pain worse. °· Physical therapy. °· Surgery to eliminate what is pressing or pinching the nerve, such as a bone spur or part of a herniated disk. °HOME CARE INSTRUCTIONS  °· Only take over-the-counter or prescription medicines for pain or discomfort as directed by your caregiver. °· Apply ice to the affected area for 20 minutes, 3-4 times a day for the first 48-72 hours. Then try heat in the same way. °· Exercise, stretch, or perform your usual activities if these do not aggravate your pain. °· Attend physical therapy sessions as directed by your caregiver. °· Keep all follow-up appointments as directed by your caregiver. °· Do not wear high heels or shoes that do not provide proper support. °· Check your mattress to see if it is too soft. A firm mattress may lessen your pain and discomfort. °SEEK IMMEDIATE MEDICAL CARE IF:  °· You lose control of your bowel or bladder (incontinence). °· You have increasing weakness in the lower back, pelvis, buttocks,   or legs.  You have redness or swelling of your back.  You have a burning sensation when you urinate.  You have pain that gets worse when you lie down or awakens you at night.  Your pain is worse than you have experienced in the past.  Your pain is lasting longer than 4 weeks.  You are suddenly losing weight without reason. MAKE SURE YOU:  Understand these  instructions.  Will watch your condition.  Will get help right away if you are not doing well or get worse.   This information is not intended to replace advice given to you by your health care provider. Make sure you discuss any questions you have with your health care provider.   Document Released: 07/03/2001 Document Revised: 03/30/2015 Document Reviewed: 11/18/2011 Elsevier Interactive Patient Education [employer].  Please return immediately if condition worsens. Please contact her primary physician or the physician you were given for referral. If you have any specialist physicians involved in her treatment and plan please also contact them. Thank you for using [facility].

## 2015-11-13 NOTE — ED Provider Notes (Signed)
Time Seen: Approximately 2100 I have reviewed the triage notes  Chief Complaint: Headache and Back Pain   History of Present Illness: Jasmine Mason is a 30 y.o. female who is here for her third visit for pain related to a back injury. 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 Patient states that she has noticed pain down into her right leg down to the level of the ankle. She denies any headache to this historian. She states her pain is mostly with ambulation. She denies any difficulty with bladder or bowel function. She states [facility]'s [facility] but has not seen an orthopedic physician at this point. She denies any neck or thoracic pain. She denies any persistent vomiting.   Past Medical History  Diagnosis Date  . GERD (gastroesophageal reflux disease)   . Tobacco abuse   . Mesenteric adenitis   . Obesity   . Back pain     Patient Active Problem List   Diagnosis Date Noted  . Abdominal pain, epigastric   . GERD (gastroesophageal reflux disease) 01/24/2015  . Hypokalemia 01/24/2015  . Abdominal pain 01/24/2015  . Leukocytosis 01/24/2015  . Chest pain 01/24/2015  . Cough 01/24/2015  . Pain of left calf 01/24/2015  . Elevated troponin 01/24/2015  . Obesity   . Mesenteric adenitis   . Tobacco abuse     Past Surgical History  Procedure Laterality Date  . Orif ankle fracture Left 1990's  . Carpal tunnel release Left ~ 2013  . Fracture surgery    . Esophagogastroduodenoscopy N/A 01/25/2015    Procedure: ESOPHAGOGASTRODUODENOSCOPY (EGD);  Surgeon: Irene Shipper, MD;  Location: [facility] ENDOSCOPY;  Service: Endoscopy;  Laterality: N/A;    Past Surgical History  Procedure Laterality Date  . Orif ankle fracture Left 1990's  . Carpal tunnel release Left ~ 2013  . Fracture surgery    . Esophagogastroduodenoscopy N/A 01/25/2015    Procedure: ESOPHAGOGASTRODUODENOSCOPY (EGD);  Surgeon: Irene Shipper, MD;   Location: [facility] ENDOSCOPY;  Service: Endoscopy;  Laterality: N/A;    Current Outpatient Rx  Name  Route  Sig  Dispense  Refill  . amoxicillin (AMOXIL) 500 MG tablet   Oral   Take 1 tablet (500 mg total) by mouth 3 (three) times daily.   30 tablet   0   . lidocaine (LIDODERM) 5 %   Transdermal   Place 1 patch onto the skin every 12 (twelve) hours. Remove & Discard patch within 12 hours or as directed by MD   10 patch   0   . LORazepam (ATIVAN) 1 MG tablet   Oral   Take 1 tablet (1 mg total) by mouth every 8 (eight) hours as needed (muscle spasm).   12 tablet   0   . methocarbamol (ROBAXIN) 500 MG tablet   Oral   Take 1 tablet (500 mg total) by mouth 4 (four) times daily.   16 tablet   0   . naproxen (NAPROSYN) 500 MG tablet   Oral   Take 1 tablet (500 mg total) by mouth 2 (two) times daily with a meal.   60 tablet   0   . oxyCODONE-acetaminophen (ROXICET) 5-325 MG tablet   Oral   Take 1 tablet by mouth every 6 (six) hours as needed.   20 tablet   0   . pantoprazole (PROTONIX) 40 MG tablet   Oral   Take  1 tablet (40 mg total) by mouth 2 (two) times daily before a meal. 1 tablet 2 times daily 2 weeks, then 1 tablet daily.   60 tablet   0   . predniSONE (STERAPRED UNI-PAK 21 TAB) 10 MG (21) TBPK tablet      6 tablets on day 1, 5 tablets on day 2, 4 tablets on day 3, etc...   21 tablet   0   . promethazine (PHENERGAN) 25 MG suppository   Rectal   Place 1 suppository (25 mg total) rectally every 6 (six) hours as needed for nausea.   12 suppository   1   . ranitidine (ZANTAC) 150 MG capsule   Oral   Take 150 mg by mouth every morning.         . sucralfate (CARAFATE) 1 G tablet   Oral   Take 1 tablet (1 g total) by mouth 4 (four) times daily.   28 tablet   1   . traMADol (ULTRAM) 50 MG tablet   Oral   Take 1 tablet (50 mg total) by mouth every 6 (six) hours as needed.   9 tablet   0     Allergies:  Review of patient's allergies indicates no known  allergies.  Family History: Family History  Problem Relation Age of Onset  . Hypertension Mother   . Hypertension Father   . Hypertension Sister   . Diabetes Father   . Heart disease Father     Social History: Social History  Substance Use Topics  . Smoking status: Current Every Day Smoker -- 0.25 packs/day for 10 years    Types: Cigarettes  . Smokeless tobacco: None  . Alcohol Use: No     Review of Systems:   10 point review of systems was performed and was otherwise negative:  Constitutional: No fever Eyes: No visual disturbances ENT: No sore throat, ear pain Cardiac: No chest pain Respiratory: No shortness of breath, wheezing, or stridor Abdomen: No abdominal pain, no vomiting, No diarrhea Endocrine: No weight loss, No night sweats Extremities: No peripheral edema, cyanosis Skin: No rashes, easy bruising Neurologic: No focal weakness, trouble with speech or swollowing Urologic: No dysuria, Hematuria, or urinary frequency   Physical Exam:  ED Triage Vitals  Enc Vitals Group     BP 11/13/15 1938 152/85 mmHg     Pulse Rate 11/13/15 1938 99     Resp 11/13/15 1938 18     Temp 11/13/15 1938 98.7 F (37.1 C)     Temp Source 11/13/15 1938 Oral     SpO2 11/13/15 1938 99 %     Weight 11/13/15 1938 212 lb (96.163 kg)     Height --      Head Cir --      Peak Flow --      Pain Score 11/13/15 1944 8     Pain Loc --      Pain Edu? --      Excl. in [place]? --     General: Awake , Alert , and Oriented times 3; GCS 15 Head: Normal cephalic , atraumatic Eyes: Pupils equal , round, reactive to light Nose/Throat: No nasal drainage, patent upper airway without erythema or exudate.  Neck: Supple, Full range of motion, No anterior adenopathy or palpable thyroid masses Lungs: Clear to ascultation without wheezes , rhonchi, or rales Heart: Regular rate, regular rhythm without murmurs , gallops , or rubs Abdomen: Soft, non tender without rebound, guarding , or rigidity; bowel  sounds positive and symmetric in all 4 quadrants. No organomegaly .        Extremities: 2 plus symmetric pulses. No edema, clubbing or cyanosis Neurologic: normal ambulation, Motor symmetric without deficits, sensory intact Skin: warm, dry, no rashes     ED Course: * Patient was given an IM shot of Toradol and had a lidocaine patch applied. She is not have any clinical symptoms consistent with cauda equina syndrome. I also felt would ever headache that she expressed out front that was not a life-threatening headache and she is resting comfortably in the stretcher. The patient was prescribed similar medication and was given a prescription for Ativan as a muscle relaxant. She was advised that she needs to follow up with an orthopedic surgeon and/or progress to MRI evaluation etc. Continually following up at an urgent care clinic is not going to get her closer to recovery.    Assessment Right-sided  Sciatica   Final Clinical Impression:  Final diagnoses:  Sciatica of right side     Plan:  Patient was advised to return immediately if condition worsens. Patient was advised to follow up with their primary care physician or other specialized physicians involved in their outpatient care.  All questions and concerns were addressed and appropriate discharge instructions were distributed by the nursing staff. Outpatient management            Daymon Larsen, MD 11/13/15 2143

## 2015-11-17 ENCOUNTER — Encounter: Payer: Self-pay | Admitting: Emergency Medicine

## 2015-11-17 ENCOUNTER — Emergency Department: Payer: Self-pay

## 2015-11-17 ENCOUNTER — Emergency Department
Admission: EM | Admit: 2015-11-17 | Discharge: 2015-11-17 | Disposition: A | Discharge status: Home / Self Care | Payer: Self-pay | Attending: Emergency Medicine | Admitting: Emergency Medicine

## 2015-11-17 DIAGNOSIS — E669 Obesity, unspecified: Secondary | ICD-10-CM | POA: Insufficient documentation

## 2015-11-17 DIAGNOSIS — R1013 Epigastric pain: Secondary | ICD-10-CM

## 2015-11-17 DIAGNOSIS — Z792 Long term (current) use of antibiotics: Secondary | ICD-10-CM | POA: Insufficient documentation

## 2015-11-17 DIAGNOSIS — K219 Gastro-esophageal reflux disease without esophagitis: Secondary | ICD-10-CM | POA: Insufficient documentation

## 2015-11-17 DIAGNOSIS — K2970 Gastritis, unspecified, without bleeding: Secondary | ICD-10-CM

## 2015-11-17 DIAGNOSIS — R112 Nausea with vomiting, unspecified: Secondary | ICD-10-CM | POA: Insufficient documentation

## 2015-11-17 DIAGNOSIS — Z79899 Other long term (current) drug therapy: Secondary | ICD-10-CM | POA: Insufficient documentation

## 2015-11-17 DIAGNOSIS — F17210 Nicotine dependence, cigarettes, uncomplicated: Secondary | ICD-10-CM | POA: Insufficient documentation

## 2015-11-17 LAB — COMPREHENSIVE METABOLIC PANEL
ALBUMIN: 5 g/dL (ref 3.5–5.0)
ALT: 37 U/L (ref 14–54)
ANION GAP: 12 (ref 5–15)
AST: 26 U/L (ref 15–41)
Alkaline Phosphatase: 97 U/L (ref 38–126)
BILIRUBIN TOTAL: 1 mg/dL (ref 0.3–1.2)
BUN: 12 mg/dL (ref 6–20)
CALCIUM: 9.6 mg/dL (ref 8.9–10.3)
CO2: 22 mmol/L (ref 22–32)
Chloride: 104 mmol/L (ref 101–111)
Creatinine, Ser: 0.72 mg/dL (ref 0.44–1.00)
GFR calc Af Amer: >60 mL/min (ref >60)
Glucose, Bld: 145 mg/dL — ABNORMAL HIGH (ref 65–99)
POTASSIUM: 4.5 mmol/L (ref 3.5–5.1)
SODIUM: 138 mmol/L (ref 135–145)
TOTAL PROTEIN: 8.9 g/dL — AB (ref 6.5–8.1)

## 2015-11-17 LAB — URINALYSIS COMPLETE WITH MICROSCOPIC (ARMC ONLY)
BILIRUBIN URINE: NEGATIVE
GLUCOSE, UA: 50 mg/dL — AB
LEUKOCYTES UA: NEGATIVE
NITRITE: NEGATIVE
Protein, ur: >500 mg/dL — AB
SPECIFIC GRAVITY, URINE: 1.029 (ref 1.005–1.030)
Squamous Epithelial / LPF: NONE SEEN
WBC, UA: NONE SEEN WBC/hpf (ref 0–5)
pH: 5 (ref 5.0–8.0)

## 2015-11-17 LAB — LIPASE, BLOOD: LIPASE: 31 U/L (ref 11–51)

## 2015-11-17 LAB — CBC
HEMATOCRIT: 48.4 % — AB (ref 35.0–47.0)
Hemoglobin: 16.2 g/dL — ABNORMAL HIGH (ref 12.0–16.0)
MCH: 29.2 pg (ref 26.0–34.0)
MCHC: 33.5 g/dL (ref 32.0–36.0)
MCV: 87.1 fL (ref 80.0–100.0)
Platelets: 346 10*3/uL (ref 150–440)
RBC: 5.56 MIL/uL — ABNORMAL HIGH (ref 3.80–5.20)
RDW: 14 % (ref 11.5–14.5)
WBC: 23.7 10*3/uL — AB (ref 3.6–11.0)

## 2015-11-17 LAB — TROPONIN I

## 2015-11-17 MED ORDER — SODIUM CHLORIDE 0.9 % IV BOLUS (SEPSIS)
1000.0000 mL | Freq: Once | INTRAVENOUS | Status: AC
  Administered 2015-11-17: 1000 mL via INTRAVENOUS

## 2015-11-17 MED ORDER — ONDANSETRON 4 MG PO TBDP
4.0000 mg | ORAL_TABLET | Freq: Once | ORAL | Status: AC | PRN
  Administered 2015-11-17: 4 mg via ORAL

## 2015-11-17 MED ORDER — ONDANSETRON HCL 4 MG/2ML IJ SOLN
4.0000 mg | Freq: Once | INTRAMUSCULAR | Status: AC
  Administered 2015-11-17: 4 mg via INTRAVENOUS

## 2015-11-17 MED ORDER — GI COCKTAIL ~~LOC~~
30.0000 mL | Freq: Once | ORAL | Status: AC
  Administered 2015-11-17: 30 mL via ORAL
  Filled 2015-11-17: qty 30

## 2015-11-17 MED ORDER — PANTOPRAZOLE SODIUM 40 MG PO TBEC
40.0000 mg | DELAYED_RELEASE_TABLET | Freq: Once | ORAL | Status: AC
  Administered 2015-11-17: 40 mg via ORAL

## 2015-11-17 MED ORDER — IOPAMIDOL (ISOVUE-300) INJECTION 61%
100.0000 mL | Freq: Once | INTRAVENOUS | Status: AC | PRN
  Administered 2015-11-17: 100 mL via INTRAVENOUS
  Filled 2015-11-17: qty 100

## 2015-11-17 MED ORDER — MORPHINE SULFATE (PF) 4 MG/ML IV SOLN
4.0000 mg | Freq: Once | INTRAVENOUS | Status: AC
  Administered 2015-11-17: 4 mg via INTRAVENOUS
  Filled 2015-11-17: qty 1

## 2015-11-17 MED ORDER — PANTOPRAZOLE SODIUM 40 MG PO TBEC
DELAYED_RELEASE_TABLET | ORAL | Status: AC
  Administered 2015-11-17: 40 mg via ORAL
  Filled 2015-11-17: qty 1

## 2015-11-17 MED ORDER — DIATRIZOATE MEGLUMINE & SODIUM 66-10 % PO SOLN
15.0000 mL | Freq: Once | ORAL | Status: AC
  Administered 2015-11-17: 15 mL via ORAL

## 2015-11-17 MED ORDER — ONDANSETRON 4 MG PO TBDP: 4.0000 mg | ORAL_TABLET | Freq: Three times a day (TID) | ORAL | Status: DC | PRN

## 2015-11-17 MED ORDER — ONDANSETRON HCL 4 MG/2ML IJ SOLN
INTRAMUSCULAR | Status: AC
  Filled 2015-11-17: qty 2

## 2015-11-17 MED ORDER — PANTOPRAZOLE SODIUM 40 MG PO TBEC: 40.0000 mg | DELAYED_RELEASE_TABLET | Freq: Every day | ORAL | Status: DC

## 2015-11-17 MED ORDER — ONDANSETRON 4 MG PO TBDP
ORAL_TABLET | ORAL | Status: AC
  Administered 2015-11-17: 4 mg via ORAL
  Filled 2015-11-17: qty 1

## 2015-11-17 MED ORDER — OXYCODONE-ACETAMINOPHEN 5-325 MG PO TABS: 1.0000 | ORAL_TABLET | Freq: Four times a day (QID) | ORAL | Status: DC | PRN

## 2015-11-17 NOTE — ED Notes (Signed)
Patient presents to the ED with upper abdominal pain, nausea, and vomiting that began yesterday evening after eating [employer].  Patient states, "I can't keep any food down."  Patient denies diarrhea.  Ambulatory to triage.  Appears uncomfortable.  Patient frequently spitting into a cup.

## 2015-11-17 NOTE — ED Provider Notes (Signed)
[facility] Emergency Department Provider Note  Time seen: 3:49 PM  I have reviewed the triage vital signs and the nursing notes.   HISTORY  Chief Complaint Abdominal Pain and Emesis    HPI Jasmine Mason is a 30 y.o. female with a past medical history of gastric reflux, obesity, chronic back pain, presents the emergency department up abdominal pain. According to the patient for the past 2-3 days she has been having significant upper abdominal pain along with nausea and vomiting. Denies any diarrhea, black or bloody stool. Denies any fever. Describes the upper abdominal pain as 7/10 in severity, burning/aching in quality. States a history of gastritis in the past which this feels similar but she is not ill without issue and at one time per patient.     Past Medical History  Diagnosis Date  . GERD (gastroesophageal reflux disease)   . Tobacco abuse   . Mesenteric adenitis   . Obesity   . Back pain   . Back pain     Patient Active Problem List   Diagnosis Date Noted  . Abdominal pain, epigastric   . GERD (gastroesophageal reflux disease) 01/24/2015  . Hypokalemia 01/24/2015  . Abdominal pain 01/24/2015  . Leukocytosis 01/24/2015  . Chest pain 01/24/2015  . Cough 01/24/2015  . Pain of left calf 01/24/2015  . Elevated troponin 01/24/2015  . Obesity   . Mesenteric adenitis   . Tobacco abuse     Past Surgical History  Procedure Laterality Date  . Orif ankle fracture Left 1990's  . Carpal tunnel release Left ~ 2013  . Fracture surgery    . Esophagogastroduodenoscopy N/A 01/25/2015    Procedure: ESOPHAGOGASTRODUODENOSCOPY (EGD);  Surgeon: Irene Shipper, MD;  Location: [facility] ENDOSCOPY;  Service: Endoscopy;  Laterality: N/A;    Current Outpatient Rx  Name  Route  Sig  Dispense  Refill  . amoxicillin (AMOXIL) 500 MG tablet   Oral   Take 1 tablet (500 mg total) by mouth 3 (three) times daily.   30 tablet   0   . lidocaine (LIDODERM) 5 %    Transdermal   Place 1 patch onto the skin every 12 (twelve) hours. Remove & Discard patch within 12 hours or as directed by MD   10 patch   0   . LORazepam (ATIVAN) 1 MG tablet   Oral   Take 1 tablet (1 mg total) by mouth every 8 (eight) hours as needed (muscle spasm).   12 tablet   0   . methocarbamol (ROBAXIN) 500 MG tablet   Oral   Take 1 tablet (500 mg total) by mouth 4 (four) times daily.   16 tablet   0   . naproxen (NAPROSYN) 500 MG tablet   Oral   Take 1 tablet (500 mg total) by mouth 2 (two) times daily with a meal.   60 tablet   0   . oxyCODONE-acetaminophen (ROXICET) 5-325 MG tablet   Oral   Take 1 tablet by mouth every 6 (six) hours as needed.   20 tablet   0   . pantoprazole (PROTONIX) 40 MG tablet   Oral   Take 1 tablet (40 mg total) by mouth 2 (two) times daily before a meal. 1 tablet 2 times daily 2 weeks, then 1 tablet daily.   60 tablet   0   . predniSONE (STERAPRED UNI-PAK 21 TAB) 10 MG (21) TBPK tablet      6 tablets on day 1, 5 tablets  on day 2, 4 tablets on day 3, etc...   21 tablet   0   . promethazine (PHENERGAN) 25 MG suppository   Rectal   Place 1 suppository (25 mg total) rectally every 6 (six) hours as needed for nausea.   12 suppository   1   . ranitidine (ZANTAC) 150 MG capsule   Oral   Take 150 mg by mouth every morning.         . sucralfate (CARAFATE) 1 G tablet   Oral   Take 1 tablet (1 g total) by mouth 4 (four) times daily.   28 tablet   1   . traMADol (ULTRAM) 50 MG tablet   Oral   Take 1 tablet (50 mg total) by mouth every 6 (six) hours as needed.   9 tablet   0     Allergies Review of patient's allergies indicates no known allergies.  Family History  Problem Relation Age of Onset  . Hypertension Mother   . Hypertension Father   . Hypertension Sister   . Diabetes Father   . Heart disease Father     Social History Social History  Substance Use Topics  . Smoking status: Current Every Day Smoker --  0.25 packs/day for 10 years    Types: Cigarettes  . Smokeless tobacco: None  . Alcohol Use: No    Review of Systems Constitutional: Negative for fever. Cardiovascular: Negative for chest pain. Respiratory: Negative for shortness of breath. Gastrointestinal: Upper abdominal pain. Positive for nausea and vomiting. Negative for diarrhea. Genitourinary: Negative for dysuria. Negative for hematuria. Neurological: Negative for headache 10-point ROS otherwise negative.  ____________________________________________   PHYSICAL EXAM:  VITAL SIGNS: ED Triage Vitals  Enc Vitals Group     BP 11/17/15 1201 167/100 mmHg     Pulse Rate 11/17/15 1201 94     Resp 11/17/15 1201 18     Temp 11/17/15 1201 98.8 F (37.1 C)     Temp Source 11/17/15 1201 Oral     SpO2 11/17/15 1201 98 %     Weight 11/17/15 1201 220 lb (99.791 kg)     Height 11/17/15 1201 5\' 7"  (1.702 m)     Head Cir --      Peak Flow --      Pain Score 11/17/15 1202 6     Pain Loc --      Pain Edu? --      Excl. in [place]? --     Constitutional: Alert and oriented. Well appearing and in no distress. Eyes: Normal exam ENT   Head: Normocephalic and atraumatic.   Mouth/Throat: Mucous membranes are moist. Cardiovascular: Normal rate, regular rhythm. No murmur Respiratory: Normal respiratory effort without tachypnea nor retractions. Breath sounds are clear  Gastrointestinal: Soft, moderate epigastric tenderness palpation without rebound or guarding. No distention. Musculoskeletal: Nontender with normal range of motion in all extremities.  Neurologic:  Normal speech and language. No gross focal neurologic deficits  Skin:  Skin is warm, dry and intact.  Psychiatric: Mood and affect are normal ____________________________________________    EKG  EKG reviewed and interpreted by myself shows normal sinus rhythm at 91 bpm, narrow QRS, normal axis, normal intervals, nonspecific ST  changes.  ____________________________________________    RADIOLOGY  CT shows no acute abnormality  ____________________________________________    INITIAL IMPRESSION / ASSESSMENT AND PLAN / ED COURSE  Pertinent labs & imaging results that were available during my care of the patient were reviewed by me and considered in  my medical decision making (see chart for details).  Patient presents the emergency department 2-3 days of upper abdominal pain along with nausea and vomiting. Denies any diarrhea. Denies fever. Patient's workup shows a significant leukocytosis of 23,000, otherwise largely normal workup. We will check a urine pregnancy test, and proceed to CT abdomen/pelvis. We will treat the patient's pain, and IV hydrate well in the emergency department.  Patient CT scan is negative. Suspect the patient is likely suffering from gastritis once again. We'll place patient on Protonix, Maalox as needed, and a short course of pain medication. Patient is to follow-up with GI medicine. Patient agreeable plan.  ____________________________________________   FINAL CLINICAL IMPRESSION(S) / ED DIAGNOSES  Nausea and vomiting Epigastric pain   Harvest Dark, MD 11/17/15 1732

## 2015-11-17 NOTE — Discharge Instructions (Signed)
You have been seen in the emergency department today for upper abdominal pain and vomiting. Her workup including a CT scan did not show any obvious cause of your pain and vomiting. This could very likely be related to gastritis. Please take your pain medication as prescribed, nausea medication as needed, and take your Protonix/acid blocking medication daily. Please call the number provided for GI medicine tomorrow morning to arrange a follow-up appointment as soon as possible. Return to the emergency department for any worsening pain, if you're unable to keep down fluids/medications due to vomiting, develop fever, or any other symptom personally concerning to your self.  Abdominal Pain, Adult Many things can cause abdominal pain. Usually, abdominal pain is not caused by a disease and will improve without treatment. It can often be observed and treated at home. Your health care provider will do a physical exam and possibly order blood tests and X-rays to help determine the seriousness of your pain. However, in many cases, more time must pass before a clear cause of the pain can be found. Before that point, your health care provider may not know if you need more testing or further treatment. HOME CARE INSTRUCTIONS Monitor your abdominal pain for any changes. The following actions may help to alleviate any discomfort you are experiencing:  Only take over-the-counter or prescription medicines as directed by your health care provider.  Do not take laxatives unless directed to do so by your health care provider.  Try a clear liquid diet (broth, tea, or water) as directed by your health care provider. Slowly move to a bland diet as tolerated. SEEK MEDICAL CARE IF:  You have unexplained abdominal pain.  You have abdominal pain associated with nausea or diarrhea.  You have pain when you urinate or have a bowel movement.  You experience abdominal pain that wakes you in the night.  You have abdominal pain  that is worsened or improved by eating food.  You have abdominal pain that is worsened with eating fatty foods.  You have a fever. SEEK IMMEDIATE MEDICAL CARE IF:  Your pain does not go away within 2 hours.  You keep throwing up (vomiting).  Your pain is felt only in portions of the abdomen, such as the right side or the left lower portion of the abdomen.  You pass bloody or black tarry stools. MAKE SURE YOU:  Understand these instructions.  Will watch your condition.  Will get help right away if you are not doing well or get worse.   This information is not intended to replace advice given to you by your health care provider. Make sure you discuss any questions you have with your health care provider.   Document Released: 04/18/2005 Document Revised: 03/30/2015 Document Reviewed: 03/18/2013 Elsevier Interactive Patient Education [employer].

## 2015-11-17 NOTE — ED Notes (Signed)
Patient has vomited large amount since getting to room.  Vomit is brown/reddish.

## 2015-11-17 NOTE — ED Notes (Signed)
Pulled zofran from pyxis from wrong visit number.  Unable to link order to overide.

## 2015-12-12 ENCOUNTER — Ambulatory Visit: Payer: Self-pay

## 2016-01-10 ENCOUNTER — Ambulatory Visit: Payer: Self-pay

## 2016-01-14 ENCOUNTER — Emergency Department: Payer: Self-pay

## 2016-01-14 ENCOUNTER — Emergency Department
Admission: EM | Admit: 2016-01-14 | Discharge: 2016-01-14 | Disposition: A | Discharge status: Home / Self Care | Payer: Self-pay | Attending: Emergency Medicine | Admitting: Emergency Medicine

## 2016-01-14 DIAGNOSIS — K2970 Gastritis, unspecified, without bleeding: Secondary | ICD-10-CM

## 2016-01-14 DIAGNOSIS — F17210 Nicotine dependence, cigarettes, uncomplicated: Secondary | ICD-10-CM | POA: Insufficient documentation

## 2016-01-14 DIAGNOSIS — R1013 Epigastric pain: Secondary | ICD-10-CM

## 2016-01-14 DIAGNOSIS — Z792 Long term (current) use of antibiotics: Secondary | ICD-10-CM | POA: Insufficient documentation

## 2016-01-14 DIAGNOSIS — Z79899 Other long term (current) drug therapy: Secondary | ICD-10-CM | POA: Insufficient documentation

## 2016-01-14 DIAGNOSIS — R112 Nausea with vomiting, unspecified: Secondary | ICD-10-CM

## 2016-01-14 LAB — COMPREHENSIVE METABOLIC PANEL
ALT: 26 U/L (ref 14–54)
ANION GAP: 10 (ref 5–15)
AST: 29 U/L (ref 15–41)
Albumin: 4.8 g/dL (ref 3.5–5.0)
Alkaline Phosphatase: 92 U/L (ref 38–126)
BUN: 7 mg/dL (ref 6–20)
CHLORIDE: 105 mmol/L (ref 101–111)
CO2: 23 mmol/L (ref 22–32)
Calcium: 9.2 mg/dL (ref 8.9–10.3)
Creatinine, Ser: 0.62 mg/dL (ref 0.44–1.00)
Glucose, Bld: 139 mg/dL — ABNORMAL HIGH (ref 65–99)
Potassium: 3.6 mmol/L (ref 3.5–5.1)
SODIUM: 138 mmol/L (ref 135–145)
Total Bilirubin: 0.5 mg/dL (ref 0.3–1.2)
Total Protein: 8.3 g/dL — ABNORMAL HIGH (ref 6.5–8.1)

## 2016-01-14 LAB — URINALYSIS COMPLETE WITH MICROSCOPIC (ARMC ONLY)
Bilirubin Urine: NEGATIVE
GLUCOSE, UA: 50 mg/dL — AB
LEUKOCYTES UA: NEGATIVE
Nitrite: NEGATIVE
PH: 5 (ref 5.0–8.0)
Protein, ur: 100 mg/dL — AB
SPECIFIC GRAVITY, URINE: 1.027 (ref 1.005–1.030)

## 2016-01-14 LAB — CBC
HEMATOCRIT: 44.5 % (ref 35.0–47.0)
HEMOGLOBIN: 14.9 g/dL (ref 12.0–16.0)
MCH: 29.5 pg (ref 26.0–34.0)
MCHC: 33.5 g/dL (ref 32.0–36.0)
MCV: 88.1 fL (ref 80.0–100.0)
PLATELETS: 356 10*3/uL (ref 150–440)
RBC: 5.06 MIL/uL (ref 3.80–5.20)
RDW: 13.5 % (ref 11.5–14.5)
WBC: 20.8 10*3/uL — AB (ref 3.6–11.0)

## 2016-01-14 LAB — LIPASE, BLOOD: LIPASE: 18 U/L (ref 11–51)

## 2016-01-14 LAB — POCT PREGNANCY, URINE: Preg Test, Ur: NEGATIVE

## 2016-01-14 LAB — TROPONIN I

## 2016-01-14 MED ORDER — IOPAMIDOL (ISOVUE-300) INJECTION 61%
100.0000 mL | Freq: Once | INTRAVENOUS | Status: AC | PRN
  Administered 2016-01-14: 100 mL via INTRAVENOUS

## 2016-01-14 MED ORDER — HALOPERIDOL LACTATE 5 MG/ML IJ SOLN
1.0000 mg | Freq: Once | INTRAMUSCULAR | Status: AC
  Administered 2016-01-14: 1 mg via INTRAVENOUS
  Filled 2016-01-14: qty 1

## 2016-01-14 MED ORDER — ONDANSETRON 4 MG PO TBDP
ORAL_TABLET | ORAL | Status: AC
  Filled 2016-01-14: qty 1

## 2016-01-14 MED ORDER — SODIUM CHLORIDE 0.9 % IV BOLUS (SEPSIS)
1000.0000 mL | Freq: Once | INTRAVENOUS | Status: AC
  Administered 2016-01-14: 1000 mL via INTRAVENOUS

## 2016-01-14 MED ORDER — DIATRIZOATE MEGLUMINE & SODIUM 66-10 % PO SOLN
15.0000 mL | Freq: Once | ORAL | Status: AC
  Administered 2016-01-14: 15 mL via ORAL

## 2016-01-14 MED ORDER — ONDANSETRON HCL 4 MG/2ML IJ SOLN
4.0000 mg | Freq: Once | INTRAMUSCULAR | Status: AC
  Administered 2016-01-14: 4 mg via INTRAVENOUS
  Filled 2016-01-14: qty 2

## 2016-01-14 MED ORDER — METOCLOPRAMIDE HCL 5 MG/ML IJ SOLN
INTRAMUSCULAR | Status: AC
  Administered 2016-01-14: 10 mg via INTRAVENOUS
  Filled 2016-01-14: qty 2

## 2016-01-14 MED ORDER — METOCLOPRAMIDE HCL 10 MG PO TABS: 10.0000 mg | ORAL_TABLET | Freq: Three times a day (TID) | ORAL | Status: DC | PRN

## 2016-01-14 MED ORDER — SUCRALFATE 1 G PO TABS: 1.0000 g | ORAL_TABLET | Freq: Two times a day (BID) | ORAL | Status: DC

## 2016-01-14 MED ORDER — MORPHINE SULFATE (PF) 4 MG/ML IV SOLN
4.0000 mg | Freq: Once | INTRAVENOUS | Status: AC
  Administered 2016-01-14: 4 mg via INTRAVENOUS
  Filled 2016-01-14: qty 1

## 2016-01-14 MED ORDER — METOCLOPRAMIDE HCL 5 MG/ML IJ SOLN
10.0000 mg | Freq: Once | INTRAMUSCULAR | Status: AC
  Administered 2016-01-14: 10 mg via INTRAVENOUS

## 2016-01-14 MED ORDER — ONDANSETRON 4 MG PO TBDP
4.0000 mg | ORAL_TABLET | Freq: Once | ORAL | Status: AC | PRN
  Administered 2016-01-14: 4 mg via ORAL

## 2016-01-14 NOTE — ED Notes (Signed)
Patient states "I've been throwing up all day", reports last emesis just prior to arrival.

## 2016-01-14 NOTE — ED Provider Notes (Signed)
[facility] Emergency Department Provider Note   ____________________________________________  Time seen: Approximately 248 AM  I have reviewed the triage vital signs and the nursing notes.   HISTORY  Chief Complaint Emesis    HPI Jasmine Mason is a 30 y.o. female who comes into the hospital today with vomiting. She reports that she had some [place] food yesterday and it did not taste funny. She reports that the emesis has been yellow but started looking and changing to brown. She reports that she is also noticed some blood in it. The patient has not had new diarrhea but does have some upper abdominal pain which she rates a 10 out of 10 in intensity. The patient has no sick contacts no pain with urination. Her last nausea. As the end of May. The patient reports that she is unable to tolerate all this vomiting so she is here for evaluation.   Past Medical History  Diagnosis Date  . GERD (gastroesophageal reflux disease)   . Tobacco abuse   . Mesenteric adenitis   . Obesity   . Back pain   . Back pain     Patient Active Problem List   Diagnosis Date Noted  . Abdominal pain, epigastric   . GERD (gastroesophageal reflux disease) 01/24/2015  . Hypokalemia 01/24/2015  . Abdominal pain 01/24/2015  . Leukocytosis 01/24/2015  . Chest pain 01/24/2015  . Cough 01/24/2015  . Pain of left calf 01/24/2015  . Elevated troponin 01/24/2015  . Obesity   . Mesenteric adenitis   . Tobacco abuse     Past Surgical History  Procedure Laterality Date  . Orif ankle fracture Left 1990's  . Carpal tunnel release Left ~ 2013  . Fracture surgery    . Esophagogastroduodenoscopy N/A 01/25/2015    Procedure: ESOPHAGOGASTRODUODENOSCOPY (EGD);  Surgeon: Irene Shipper, MD;  Location: [facility] ENDOSCOPY;  Service: Endoscopy;  Laterality: N/A;    Current Outpatient Rx  Name  Route  Sig  Dispense  Refill  . amoxicillin (AMOXIL) 500 MG tablet   Oral   Take 1 tablet (500 mg  total) by mouth 3 (three) times daily.   30 tablet   0   . lidocaine (LIDODERM) 5 %   Transdermal   Place 1 patch onto the skin every 12 (twelve) hours. Remove & Discard patch within 12 hours or as directed by MD   10 patch   0   . LORazepam (ATIVAN) 1 MG tablet   Oral   Take 1 tablet (1 mg total) by mouth every 8 (eight) hours as needed (muscle spasm).   12 tablet   0   . methocarbamol (ROBAXIN) 500 MG tablet   Oral   Take 1 tablet (500 mg total) by mouth 4 (four) times daily.   16 tablet   0   . metoCLOPramide (REGLAN) 10 MG tablet   Oral   Take 1 tablet (10 mg total) by mouth every 8 (eight) hours as needed.   20 tablet   0   . naproxen (NAPROSYN) 500 MG tablet   Oral   Take 1 tablet (500 mg total) by mouth 2 (two) times daily with a meal.   60 tablet   0   . ondansetron (ZOFRAN ODT) 4 MG disintegrating tablet   Oral   Take 1 tablet (4 mg total) by mouth every 8 (eight) hours as needed for nausea or vomiting.   20 tablet   0   . oxyCODONE-acetaminophen (ROXICET) 5-325 MG  tablet   Oral   Take 1 tablet by mouth every 6 (six) hours as needed.   15 tablet   0   . pantoprazole (PROTONIX) 40 MG tablet   Oral   Take 1 tablet (40 mg total) by mouth daily.   30 tablet   1   . predniSONE (STERAPRED UNI-PAK 21 TAB) 10 MG (21) TBPK tablet      6 tablets on day 1, 5 tablets on day 2, 4 tablets on day 3, etc...   21 tablet   0   . promethazine (PHENERGAN) 25 MG suppository   Rectal   Place 1 suppository (25 mg total) rectally every 6 (six) hours as needed for nausea.   12 suppository   1   . ranitidine (ZANTAC) 150 MG capsule   Oral   Take 150 mg by mouth every morning.         . sucralfate (CARAFATE) 1 G tablet   Oral   Take 1 tablet (1 g total) by mouth 4 (four) times daily.   28 tablet   1   . sucralfate (CARAFATE) 1 g tablet   Oral   Take 1 tablet (1 g total) by mouth 2 (two) times daily.   20 tablet   0   . traMADol (ULTRAM) 50 MG tablet    Oral   Take 1 tablet (50 mg total) by mouth every 6 (six) hours as needed.   9 tablet   0     Allergies Review of patient's allergies indicates no known allergies.  Family History  Problem Relation Age of Onset  . Hypertension Mother   . Hypertension Father   . Hypertension Sister   . Diabetes Father   . Heart disease Father     Social History Social History  Substance Use Topics  . Smoking status: Current Every Day Smoker -- 0.25 packs/day for 10 years    Types: Cigarettes  . Smokeless tobacco: None  . Alcohol Use: No    Review of Systems Constitutional: No fever/chills Eyes: No visual changes. ENT: No sore throat. Cardiovascular: Denies chest pain. Respiratory: Denies shortness of breath. Gastrointestinal:  abdominal pain, nausea,  vomiting.  No diarrhea.  No constipation. Genitourinary: Negative for dysuria. Musculoskeletal: Negative for back pain. Skin: Negative for rash. Neurological: Negative for headaches, focal weakness or numbness.  10-point ROS otherwise negative.  ____________________________________________   PHYSICAL EXAM:  VITAL SIGNS: ED Triage Vitals  Enc Vitals Group     BP 01/14/16 0040 158/93 mmHg     Pulse Rate 01/14/16 0040 72     Resp 01/14/16 0040 20     Temp 01/14/16 0040 98.9 F (37.2 C)     Temp Source 01/14/16 0040 Oral     SpO2 01/14/16 0040 100 %     Weight 01/14/16 0036 212 lb (96.163 kg)     Height 01/14/16 0036 5\' 7"  (1.702 m)     Head Cir --      Peak Flow --      Pain Score 01/14/16 0234 10     Pain Loc --      Pain Edu? --      Excl. in [place]? --     Constitutional: Alert and oriented. Actively vomiting and in Moderate distress. Eyes: Conjunctivae are normal. PERRL. EOMI. Head: Atraumatic. Nose: No congestion/rhinnorhea. Mouth/Throat: Mucous membranes are moist.  Oropharynx non-erythematous. Cardiovascular: Normal rate, regular rhythm. Grossly normal heart sounds.  Good peripheral circulation. Respiratory: Normal  respiratory effort.  No retractions. Lungs CTAB. Gastrointestinal: Soft with epigastric tenderness to palpation. No distention. Positive bowel sounds Musculoskeletal: No lower extremity tenderness nor edema.   Neurologic:  Normal speech and language.  Skin:  Skin is warm, dry and intact. Psychiatric: Mood and affect are normal.   ____________________________________________   LABS (all labs ordered are listed, but only abnormal results are displayed)  Labs Reviewed  COMPREHENSIVE METABOLIC PANEL - Abnormal; Notable for the following:    Glucose, Bld 139 (*)    Total Protein 8.3 (*)    All other components within normal limits  CBC - Abnormal; Notable for the following:    WBC 20.8 (*)    All other components within normal limits  URINALYSIS COMPLETEWITH MICROSCOPIC ([facility] ONLY) - Abnormal; Notable for the following:    Color, Urine YELLOW (*)    APPearance HAZY (*)    Glucose, UA 50 (*)    Ketones, ur 2+ (*)    Hgb urine dipstick 1+ (*)    Protein, ur 100 (*)    Bacteria, UA RARE (*)    Squamous Epithelial / LPF 6-30 (*)    All other components within normal limits  LIPASE, BLOOD  TROPONIN I  POC URINE PREG, ED  POCT PREGNANCY, URINE   ____________________________________________  EKG  None ____________________________________________  RADIOLOGY  CT abdomen and pelvis: No acute intra-abdominal or pelvic process, 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 ____________________________________________   PROCEDURES  Procedure(s) performed: None  Critical Care performed: No  ____________________________________________   INITIAL IMPRESSION / ASSESSMENT AND PLAN / ED COURSE  Pertinent labs & imaging results that were available during my care of the patient were reviewed by me and considered in my medical decision making (see chart for details).  This is a 30 year old female who comes into the hospital today with vomiting and abdominal  pain. To give the patient a liter of normal saline but she continued to have some pain so I gave her 4 of morphine. I will do a CT scan of the patient's abdomen to evaluate for possible cause of her vomiting and abdominal pain.  The patient does have an elevated white blood cell count of 20 but she has had some elevated white blood cell counts in the past. The patient received a dose of Reglan as well as some Haldol for her nausea and vomiting. After the Haldol and the second liter of normal saline the patient's vomiting seemed to be improved. She was eating some ice chips. I discussed with the patient that she does need to follow back up with her primary care physician and she does need to take her stomach medicines. The CT scan does not show any acute process at this time. If the patient remains able to keep down her ice chips that she will be discharged to home to follow-up with her primary care physician. ____________________________________________   FINAL CLINICAL IMPRESSION(S) / ED DIAGNOSES  Final diagnoses:  Non-intractable vomiting with nausea, vomiting of unspecified type  Gastritis  Epigastric pain      NEW MEDICATIONS STARTED DURING THIS VISIT:  New Prescriptions   METOCLOPRAMIDE (REGLAN) 10 MG TABLET    Take 1 tablet (10 mg total) by mouth every 8 (eight) hours as needed.   SUCRALFATE (CARAFATE) 1 G TABLET    Take 1 tablet (1 g total) by mouth 2 (two) times daily.     Note:  This document was prepared using Dragon voice recognition software and may include unintentional dictation errors.  Loney Hering, MD 01/14/16 [phone]

## 2016-01-14 NOTE — Discharge Instructions (Signed)
Abdominal Pain, Adult Many things can cause abdominal pain. Usually, abdominal pain is not caused by a disease and will improve without treatment. It can often be observed and treated at home. Your health care provider will do a physical exam and possibly order blood tests and X-rays to help determine the seriousness of your pain. However, in many cases, more time must pass before a clear cause of the pain can be found. Before that point, your health care provider may not know if you need more testing or further treatment. HOME CARE INSTRUCTIONS Monitor your abdominal pain for any changes. The following actions may help to alleviate any discomfort you are experiencing:  Only take over-the-counter or prescription medicines as directed by your health care provider.  Do not take laxatives unless directed to do so by your health care provider.  Try a clear liquid diet (broth, tea, or water) as directed by your health care provider. Slowly move to a bland diet as tolerated. SEEK MEDICAL CARE IF:  You have unexplained abdominal pain.  You have abdominal pain associated with nausea or diarrhea.  You have pain when you urinate or have a bowel movement.  You experience abdominal pain that wakes you in the night.  You have abdominal pain that is worsened or improved by eating food.  You have abdominal pain that is worsened with eating fatty foods.  You have a fever. SEEK IMMEDIATE MEDICAL CARE IF:  Your pain does not go away within 2 hours.  You keep throwing up (vomiting).  Your pain is felt only in portions of the abdomen, such as the right side or the left lower portion of the abdomen.  You pass bloody or black tarry stools. MAKE SURE YOU:  Understand these instructions.  Will watch your condition.  Will get help right away if you are not doing well or get worse.   This information is not intended to replace advice given to you by your health care provider. Make sure you discuss  any questions you have with your health care provider.   Document Released: 04/18/2005 Document Revised: 03/30/2015 Document Reviewed: 03/18/2013 Elsevier Interactive Patient Education 2016 Elsevier Inc.  Gastritis, Adult Gastritis is soreness and swelling (inflammation) of the lining of the stomach. Gastritis can develop as a sudden onset (acute) or long-term (chronic) condition. If gastritis is not treated, it can lead to stomach bleeding and ulcers. CAUSES  Gastritis occurs when the stomach lining is weak or damaged. Digestive juices from the stomach then inflame the weakened stomach lining. The stomach lining may be weak or damaged due to viral or bacterial infections. One common bacterial infection is the Helicobacter pylori infection. Gastritis can also result from excessive alcohol consumption, taking certain medicines, or having too much acid in the stomach.  SYMPTOMS  In some cases, there are no symptoms. When symptoms are present, they may include:  Pain or a burning sensation in the upper abdomen.  Nausea.  Vomiting.  An uncomfortable feeling of fullness after eating. DIAGNOSIS  Your caregiver may suspect you have gastritis based on your symptoms and a physical exam. To determine the cause of your gastritis, your caregiver may perform the following:  Blood or stool tests to check for the H pylori bacterium.  Gastroscopy. A thin, flexible tube (endoscope) is passed down the esophagus and into the stomach. The endoscope has a light and camera on the end. Your caregiver uses the endoscope to view the inside of the stomach.  Taking a tissue sample (  biopsy) from the stomach to examine under a microscope. TREATMENT  Depending on the cause of your gastritis, medicines may be prescribed. If you have a bacterial infection, such as an H pylori infection, antibiotics may be given. If your gastritis is caused by too much acid in the stomach, H2 blockers or antacids may be given. Your  caregiver may recommend that you stop taking aspirin, ibuprofen, or other nonsteroidal anti-inflammatory drugs (NSAIDs). HOME CARE INSTRUCTIONS  Only take over-the-counter or prescription medicines as directed by your caregiver.  If you were given antibiotic medicines, take them as directed. Finish them even if you start to feel better.  Drink enough fluids to keep your urine clear or pale yellow.  Avoid foods and drinks that make your symptoms worse, such as:  Caffeine or alcoholic drinks.  Chocolate.  Peppermint or mint flavorings.  Garlic and onions.  Spicy foods.  Citrus fruits, such as oranges, lemons, or limes.  Tomato-based foods such as sauce, chili, salsa, and pizza.  Fried and fatty foods.  Eat small, frequent meals instead of large meals. SEEK IMMEDIATE MEDICAL CARE IF:   You have black or dark red stools.  You vomit blood or material that looks like coffee grounds.  You are unable to keep fluids down.  Your abdominal pain gets worse.  You have a fever.  You do not feel better after 1 week.  You have any other questions or concerns. MAKE SURE YOU:  Understand these instructions.  Will watch your condition.  Will get help right away if you are not doing well or get worse.   This information is not intended to replace advice given to you by your health care provider. Make sure you discuss any questions you have with your health care provider.   Document Released: 07/03/2001 Document Revised: 01/08/2012 Document Reviewed: 08/22/2011 Elsevier Interactive Patient Education 2016 Elsevier Inc.  Nausea and Vomiting Nausea is a sick feeling that often comes before throwing up (vomiting). Vomiting is a reflex where stomach contents come out of your mouth. Vomiting can cause severe loss of body fluids (dehydration). Children and elderly adults can become dehydrated quickly, especially if they also have diarrhea. Nausea and vomiting are symptoms of a condition  or disease. It is important to find the cause of your symptoms. CAUSES   Direct irritation of the stomach lining. This irritation can result from increased acid production (gastroesophageal reflux disease), infection, food poisoning, taking certain medicines (such as nonsteroidal anti-inflammatory drugs), alcohol use, or tobacco use.  Signals from the brain.These signals could be caused by a headache, heat exposure, an inner ear disturbance, increased pressure in the brain from injury, infection, a tumor, or a concussion, pain, emotional stimulus, or metabolic problems.  An obstruction in the gastrointestinal tract (bowel obstruction).  Illnesses such as diabetes, hepatitis, gallbladder problems, appendicitis, kidney problems, cancer, sepsis, atypical symptoms of a heart attack, or eating disorders.  Medical treatments such as chemotherapy and radiation.  Receiving medicine that makes you sleep (general anesthetic) during surgery. DIAGNOSIS Your caregiver may ask for tests to be done if the problems do not improve after a few days. Tests may also be done if symptoms are severe or if the reason for the nausea and vomiting is not clear. Tests may include:  Urine tests.  Blood tests.  Stool tests.  Cultures (to look for evidence of infection).  X-rays or other imaging studies. Test results can help your caregiver make decisions about treatment or the need for additional tests.  TREATMENT You need to stay well hydrated. Drink frequently but in small amounts.You may wish to drink water, sports drinks, clear broth, or eat frozen ice pops or gelatin dessert to help stay hydrated.When you eat, eating slowly may help prevent nausea.There are also some antinausea medicines that may help prevent nausea. HOME CARE INSTRUCTIONS   Take all medicine as directed by your caregiver.  If you do not have an appetite, do not force yourself to eat. However, you must continue to drink fluids.  If you  have an appetite, eat a normal diet unless your caregiver tells you differently.  Eat a variety of complex carbohydrates (rice, wheat, potatoes, bread), lean meats, yogurt, fruits, and vegetables.  Avoid high-fat foods because they are more difficult to digest.  Drink enough water and fluids to keep your urine clear or pale yellow.  If you are dehydrated, ask your caregiver for specific rehydration instructions. Signs of dehydration may include:  Severe thirst.  Dry lips and mouth.  Dizziness.  Dark urine.  Decreasing urine frequency and amount.  Confusion.  Rapid breathing or pulse. SEEK IMMEDIATE MEDICAL CARE IF:   You have blood or brown flecks (like coffee grounds) in your vomit.  You have black or bloody stools.  You have a severe headache or stiff neck.  You are confused.  You have severe abdominal pain.  You have chest pain or trouble breathing.  You do not urinate at least once every 8 hours.  You develop cold or clammy skin.  You continue to vomit for longer than 24 to 48 hours.  You have a fever. MAKE SURE YOU:   Understand these instructions.  Will watch your condition.  Will get help right away if you are not doing well or get worse.   This information is not intended to replace advice given to you by your health care provider. Make sure you discuss any questions you have with your health care provider.   Document Released: 07/09/2005 Document Revised: 10/01/2011 Document Reviewed: 12/06/2010 Elsevier Interactive Patient Education [employer].

## 2016-01-14 NOTE — ED Notes (Signed)
Pt states she is unable to give urine sample at this time. 

## 2016-01-14 NOTE — ED Notes (Signed)
Pt calls out stating that she is having severe abd pain and is nauseated. This RN went into the room to patient actively vomiting up what appeared to be thick vomit that smelled of peanut butter. Pt states that all she is throwing up is bile and is wondering if it is related to the medication that she takes to increased the amount of bile her stomach produces. This RN stated that the doctor would have to talk to her regarding that.

## 2016-01-15 ENCOUNTER — Emergency Department
Admission: EM | Admit: 2016-01-15 | Discharge: 2016-01-15 | Disposition: A | Discharge status: Home / Self Care | Payer: Self-pay | Attending: Emergency Medicine | Admitting: Emergency Medicine

## 2016-01-15 ENCOUNTER — Encounter: Payer: Self-pay | Admitting: Emergency Medicine

## 2016-01-15 ENCOUNTER — Other Ambulatory Visit: Payer: Self-pay

## 2016-01-15 DIAGNOSIS — E876 Hypokalemia: Secondary | ICD-10-CM | POA: Insufficient documentation

## 2016-01-15 DIAGNOSIS — F17210 Nicotine dependence, cigarettes, uncomplicated: Secondary | ICD-10-CM | POA: Insufficient documentation

## 2016-01-15 DIAGNOSIS — R1111 Vomiting without nausea: Secondary | ICD-10-CM | POA: Insufficient documentation

## 2016-01-15 DIAGNOSIS — R109 Unspecified abdominal pain: Secondary | ICD-10-CM

## 2016-01-15 DIAGNOSIS — Z79899 Other long term (current) drug therapy: Secondary | ICD-10-CM | POA: Insufficient documentation

## 2016-01-15 DIAGNOSIS — R197 Diarrhea, unspecified: Secondary | ICD-10-CM | POA: Insufficient documentation

## 2016-01-15 LAB — CBC
HCT: 43.4 % (ref 35.0–47.0)
Hemoglobin: 14.7 g/dL (ref 12.0–16.0)
MCH: 29.5 pg (ref 26.0–34.0)
MCHC: 34 g/dL (ref 32.0–36.0)
MCV: 86.9 fL (ref 80.0–100.0)
PLATELETS: 357 10*3/uL (ref 150–440)
RBC: 4.99 MIL/uL (ref 3.80–5.20)
RDW: 13.5 % (ref 11.5–14.5)
WBC: 15 10*3/uL — ABNORMAL HIGH (ref 3.6–11.0)

## 2016-01-15 LAB — COMPREHENSIVE METABOLIC PANEL
ALBUMIN: 4.4 g/dL (ref 3.5–5.0)
ALK PHOS: 79 U/L (ref 38–126)
ALT: 24 U/L (ref 14–54)
ANION GAP: 15 (ref 5–15)
AST: 24 U/L (ref 15–41)
BILIRUBIN TOTAL: 1.1 mg/dL (ref 0.3–1.2)
BUN: 11 mg/dL (ref 6–20)
CALCIUM: 9 mg/dL (ref 8.9–10.3)
CO2: 19 mmol/L — AB (ref 22–32)
CREATININE: 0.58 mg/dL (ref 0.44–1.00)
Chloride: 102 mmol/L (ref 101–111)
GFR calc Af Amer: >60 mL/min (ref >60)
GFR calc non Af Amer: >60 mL/min (ref >60)
GLUCOSE: 134 mg/dL — AB (ref 65–99)
Potassium: 2.9 mmol/L — CL (ref 3.5–5.1)
SODIUM: 136 mmol/L (ref 135–145)
TOTAL PROTEIN: 7.8 g/dL (ref 6.5–8.1)

## 2016-01-15 LAB — TROPONIN I: Troponin I: <0.03 ng/mL (ref <0.031)

## 2016-01-15 LAB — LIPASE, BLOOD: Lipase: 35 U/L (ref 11–51)

## 2016-01-15 MED ORDER — POTASSIUM CHLORIDE CRYS ER 20 MEQ PO TBCR: 40.0000 meq | EXTENDED_RELEASE_TABLET | Freq: Once | ORAL | Status: DC

## 2016-01-15 MED ORDER — POTASSIUM CHLORIDE CRYS ER 20 MEQ PO TBCR
EXTENDED_RELEASE_TABLET | ORAL | Status: AC
  Administered 2016-01-15: 40 meq via ORAL
  Filled 2016-01-15: qty 2

## 2016-01-15 MED ORDER — ONDANSETRON HCL 4 MG/2ML IJ SOLN
4.0000 mg | Freq: Once | INTRAMUSCULAR | Status: AC | PRN
  Administered 2016-01-15: 4 mg via INTRAVENOUS
  Filled 2016-01-15: qty 2

## 2016-01-15 MED ORDER — POTASSIUM CHLORIDE CRYS ER 20 MEQ PO TBCR
40.0000 meq | EXTENDED_RELEASE_TABLET | Freq: Once | ORAL | Status: AC
  Administered 2016-01-15: 40 meq via ORAL

## 2016-01-15 MED ORDER — PROMETHAZINE HCL 25 MG RE SUPP: 25.0000 mg | Freq: Four times a day (QID) | RECTAL | Status: DC | PRN

## 2016-01-15 MED ORDER — ONDANSETRON HCL 4 MG/2ML IJ SOLN
4.0000 mg | Freq: Once | INTRAMUSCULAR | Status: AC
  Administered 2016-01-15: 4 mg via INTRAVENOUS

## 2016-01-15 MED ORDER — DICYCLOMINE HCL 10 MG/ML IM SOLN
INTRAMUSCULAR | Status: AC
  Administered 2016-01-15: 20 mg via INTRAMUSCULAR
  Filled 2016-01-15: qty 2

## 2016-01-15 MED ORDER — DICYCLOMINE HCL 10 MG/ML IM SOLN
20.0000 mg | Freq: Once | INTRAMUSCULAR | Status: AC
  Administered 2016-01-15: 20 mg via INTRAMUSCULAR

## 2016-01-15 MED ORDER — ONDANSETRON HCL 4 MG/2ML IJ SOLN
INTRAMUSCULAR | Status: AC
  Administered 2016-01-15: 4 mg via INTRAVENOUS
  Filled 2016-01-15: qty 2

## 2016-01-15 NOTE — ED Provider Notes (Signed)
[facility] Emergency Department Provider Note   ____________________________________________  Time seen: ~1415  I have reviewed the triage vital signs and the nursing notes.   HISTORY  Chief Complaint Emesis and Abdominal Pain   History limited by: Not Limited   HPI Jasmine Mason is a 30 y.o. female who presents to the emergency department today because of concern for "being sick for three days." The patient's symptoms have consisted of nausea, vomiting and now diarrhea. This has been accompanied by abdominal pain. She was seen in the emergency department yesterday for similar symptoms. She had blood work and a CT scan done. CT without concerning findings. Since then the patient has continued to have vomiting. She did try taking the nausea medicine that was prescribed to her however continued to throw it up. She denies any fevers.   Past Medical History  Diagnosis Date  . GERD (gastroesophageal reflux disease)   . Tobacco abuse   . Mesenteric adenitis   . Obesity   . Back pain   . Back pain     Patient Active Problem List   Diagnosis Date Noted  . Abdominal pain, epigastric   . GERD (gastroesophageal reflux disease) 01/24/2015  . Hypokalemia 01/24/2015  . Abdominal pain 01/24/2015  . Leukocytosis 01/24/2015  . Chest pain 01/24/2015  . Cough 01/24/2015  . Pain of left calf 01/24/2015  . Elevated troponin 01/24/2015  . Obesity   . Mesenteric adenitis   . Tobacco abuse     Past Surgical History  Procedure Laterality Date  . Orif ankle fracture Left 1990's  . Carpal tunnel release Left ~ 2013  . Fracture surgery    . Esophagogastroduodenoscopy N/A 01/25/2015    Procedure: ESOPHAGOGASTRODUODENOSCOPY (EGD);  Surgeon: Irene Shipper, MD;  Location: [facility] ENDOSCOPY;  Service: Endoscopy;  Laterality: N/A;    Current Outpatient Rx  Name  Route  Sig  Dispense  Refill  . amoxicillin (AMOXIL) 500 MG tablet   Oral   Take 1 tablet (500 mg total) by  mouth 3 (three) times daily.   30 tablet   0   . lidocaine (LIDODERM) 5 %   Transdermal   Place 1 patch onto the skin every 12 (twelve) hours. Remove & Discard patch within 12 hours or as directed by MD   10 patch   0   . LORazepam (ATIVAN) 1 MG tablet   Oral   Take 1 tablet (1 mg total) by mouth every 8 (eight) hours as needed (muscle spasm).   12 tablet   0   . methocarbamol (ROBAXIN) 500 MG tablet   Oral   Take 1 tablet (500 mg total) by mouth 4 (four) times daily.   16 tablet   0   . metoCLOPramide (REGLAN) 10 MG tablet   Oral   Take 1 tablet (10 mg total) by mouth every 8 (eight) hours as needed.   20 tablet   0   . naproxen (NAPROSYN) 500 MG tablet   Oral   Take 1 tablet (500 mg total) by mouth 2 (two) times daily with a meal.   60 tablet   0   . ondansetron (ZOFRAN ODT) 4 MG disintegrating tablet   Oral   Take 1 tablet (4 mg total) by mouth every 8 (eight) hours as needed for nausea or vomiting.   20 tablet   0   . oxyCODONE-acetaminophen (ROXICET) 5-325 MG tablet   Oral   Take 1 tablet by mouth every 6 (six)  hours as needed.   15 tablet   0   . pantoprazole (PROTONIX) 40 MG tablet   Oral   Take 1 tablet (40 mg total) by mouth daily.   30 tablet   1   . predniSONE (STERAPRED UNI-PAK 21 TAB) 10 MG (21) TBPK tablet      6 tablets on day 1, 5 tablets on day 2, 4 tablets on day 3, etc...   21 tablet   0   . promethazine (PHENERGAN) 25 MG suppository   Rectal   Place 1 suppository (25 mg total) rectally every 6 (six) hours as needed for nausea.   12 suppository   1   . ranitidine (ZANTAC) 150 MG capsule   Oral   Take 150 mg by mouth every morning.         . sucralfate (CARAFATE) 1 G tablet   Oral   Take 1 tablet (1 g total) by mouth 4 (four) times daily.   28 tablet   1   . sucralfate (CARAFATE) 1 g tablet   Oral   Take 1 tablet (1 g total) by mouth 2 (two) times daily.   20 tablet   0   . traMADol (ULTRAM) 50 MG tablet   Oral    Take 1 tablet (50 mg total) by mouth every 6 (six) hours as needed.   9 tablet   0     Allergies Review of patient's allergies indicates no known allergies.  Family History  Problem Relation Age of Onset  . Hypertension Mother   . Hypertension Father   . Hypertension Sister   . Diabetes Father   . Heart disease Father     Social History Social History  Substance Use Topics  . Smoking status: Current Every Day Smoker -- 0.25 packs/day for 10 years    Types: Cigarettes  . Smokeless tobacco: None  . Alcohol Use: No    Review of Systems  Constitutional: Negative for fever. Cardiovascular: Negative for chest pain. Respiratory: Negative for shortness of breath. Gastrointestinal: Positive for abdominal pain, vomiting and diarrhea.  Genitourinary: Negative for dysuria. Musculoskeletal: Negative for back pain. Skin: Negative for rash. Neurological: Negative for headaches, focal weakness or numbness.  10-point ROS otherwise negative.  ____________________________________________   PHYSICAL EXAM:  VITAL SIGNS: ED Triage Vitals  Enc Vitals Group     BP 01/15/16 1313 180/109 mmHg     Pulse Rate 01/15/16 1313 71     Resp 01/15/16 1313 18     Temp 01/15/16 1313 98.2 F (36.8 C)     Temp Source 01/15/16 1313 Oral     SpO2 01/15/16 1313 95 %     Weight 01/15/16 1313 212 lb (96.163 kg)     Height --      Head Cir --      Peak Flow --      Pain Score 01/15/16 1311 10   Constitutional: Alert and oriented. Well appearing and in no distress. Eyes: Conjunctivae are normal. PERRL. Normal extraocular movements. ENT   Head: Normocephalic and atraumatic.   Nose: No congestion/rhinnorhea.   Mouth/Throat: Mucous membranes are moist.   Neck: No stridor. Hematological/Lymphatic/Immunilogical: No cervical lymphadenopathy. Cardiovascular: Normal rate, regular rhythm.  No murmurs, rubs, or gallops. Respiratory: Normal respiratory effort without tachypnea nor  retractions. Breath sounds are clear and equal bilaterally. No wheezes/rales/rhonchi. Gastrointestinal: Soft and nontender. No distention. There is no CVA tenderness. Genitourinary: Deferred Musculoskeletal: Normal range of motion in all extremities. No joint effusions.  No lower extremity tenderness nor edema. Neurologic:  Normal speech and language. No gross focal neurologic deficits are appreciated.  Skin:  Skin is warm, dry and intact. No rash noted. Psychiatric: Mood and affect are normal. Speech and behavior are normal. Patient exhibits appropriate insight and judgment.  ____________________________________________    LABS (pertinent positives/negatives)  Labs Reviewed  COMPREHENSIVE METABOLIC PANEL - Abnormal; Notable for the following:    Potassium 2.9 (*)    CO2 19 (*)    Glucose, Bld 134 (*)    All other components within normal limits  CBC - Abnormal; Notable for the following:    WBC 15.0 (*)    All other components within normal limits  TROPONIN I  LIPASE, BLOOD  URINALYSIS COMPLETEWITH MICROSCOPIC ([facility] ONLY)     ____________________________________________   EKG  I, Nance Pear, attending physician, personally viewed and interpreted this EKG  EKG Time: 1317 Rate: 64 Rhythm: normal sinus rhythm Axis: normal Intervals: qtc 447 QRS: narrow, q waves III ST changes: no st elevation Impression: abnormal ekg  ____________________________________________    RADIOLOGY  None  ____________________________________________   PROCEDURES  Procedure(s) performed: None  Critical Care performed: No  ____________________________________________   INITIAL IMPRESSION / ASSESSMENT AND PLAN / ED COURSE  Pertinent labs & imaging results that were available during my care of the patient were reviewed by me and considered in my medical decision making (see chart for details).  Patient presented to the emergency department today because of concerns for  continued nausea vomiting abdominal pain. Patient was seen and evaluated yesterday. Had a negative CT scan done at that time. Today patient has leukocytosis however it has improved from yesterday. At this point given benign abdominal exam and did not think repeat imaging is warranted. Discussed with patient that she likely will continue to feel somewhat sick until her body gets over her current illness. Will give prescriptions for antibiotic suppositories. Additionally did give patient potassium here given hypokalemia found on blood work.  ____________________________________________   FINAL CLINICAL IMPRESSION(S) / ED DIAGNOSES  Final diagnoses:  Abdominal pain, unspecified abdominal location  Vomiting without nausea, vomiting of unspecified type  Hypokalemia     Note: This dictation was prepared with Dragon dictation. Any transcriptional errors that result from this process are unintentional    Nance Pear, MD 01/15/16 1625

## 2016-01-15 NOTE — Discharge Instructions (Signed)
Please seek medical attention for any high fevers, chest pain, shortness of breath, change in behavior, persistent vomiting, bloody stool or any other new or concerning symptoms.   Nausea and Vomiting Nausea means you feel sick to your stomach. Throwing up (vomiting) is a reflex where stomach contents come out of your mouth. HOME CARE   Take medicine as told by your doctor.  Do not force yourself to eat. However, you do need to drink fluids.  If you feel like eating, eat a normal diet as told by your doctor.  Eat rice, wheat, potatoes, bread, lean meats, yogurt, fruits, and vegetables.  Avoid high-fat foods.  Drink enough fluids to keep your pee (urine) clear or pale yellow.  Ask your doctor how to replace body fluid losses (rehydrate). Signs of body fluid loss (dehydration) include:  Feeling very thirsty.  Dry lips and mouth.  Feeling dizzy.  Dark pee.  Peeing less than normal.  Feeling confused.  Fast breathing or heart rate. GET HELP RIGHT AWAY IF:   You have blood in your throw up.  You have black or bloody poop (stool).  You have a bad headache or stiff neck.  You feel confused.  You have bad belly (abdominal) pain.  You have chest pain or trouble breathing.  You do not pee at least once every 8 hours.  You have cold, clammy skin.  You keep throwing up after 24 to 48 hours.  You have a fever. MAKE SURE YOU:   Understand these instructions.  Will watch your condition.  Will get help right away if you are not doing well or get worse.   This information is not intended to replace advice given to you by your health care provider. Make sure you discuss any questions you have with your health care provider.   Document Released: 12/26/2007 Document Revised: 10/01/2011 Document Reviewed: 12/08/2010 Elsevier Interactive Patient Education [employer].

## 2016-01-15 NOTE — ED Notes (Signed)
Pt presents to ED with reports of vomiting and abdominal pain for three days. Pt reports diarrhea this morning. Pt was seen, treated and released from ED yesterday. Pt moaning in triage.

## 2016-01-26 ENCOUNTER — Ambulatory Visit: Payer: Self-pay

## 2016-03-10 ENCOUNTER — Observation Stay
Admission: EM | Admit: 2016-03-10 | Discharge: 2016-03-13 | Disposition: A | Discharge status: Home / Self Care | Payer: Self-pay | Attending: Internal Medicine | Admitting: Internal Medicine

## 2016-03-10 ENCOUNTER — Encounter: Payer: Self-pay | Admitting: Emergency Medicine

## 2016-03-10 DIAGNOSIS — K449 Diaphragmatic hernia without obstruction or gangrene: Secondary | ICD-10-CM

## 2016-03-10 DIAGNOSIS — Z79899 Other long term (current) drug therapy: Secondary | ICD-10-CM | POA: Insufficient documentation

## 2016-03-10 DIAGNOSIS — R112 Nausea with vomiting, unspecified: Secondary | ICD-10-CM

## 2016-03-10 DIAGNOSIS — Z9889 Other specified postprocedural states: Secondary | ICD-10-CM | POA: Insufficient documentation

## 2016-03-10 DIAGNOSIS — I880 Nonspecific mesenteric lymphadenitis: Secondary | ICD-10-CM | POA: Insufficient documentation

## 2016-03-10 DIAGNOSIS — Z6834 Body mass index (BMI) 34.0-34.9, adult: Secondary | ICD-10-CM | POA: Insufficient documentation

## 2016-03-10 DIAGNOSIS — Z833 Family history of diabetes mellitus: Secondary | ICD-10-CM | POA: Insufficient documentation

## 2016-03-10 DIAGNOSIS — D72829 Elevated white blood cell count, unspecified: Secondary | ICD-10-CM | POA: Insufficient documentation

## 2016-03-10 DIAGNOSIS — E669 Obesity, unspecified: Secondary | ICD-10-CM | POA: Insufficient documentation

## 2016-03-10 DIAGNOSIS — R1110 Vomiting, unspecified: Secondary | ICD-10-CM | POA: Diagnosis present

## 2016-03-10 DIAGNOSIS — G8929 Other chronic pain: Secondary | ICD-10-CM | POA: Insufficient documentation

## 2016-03-10 DIAGNOSIS — Z8249 Family history of ischemic heart disease and other diseases of the circulatory system: Secondary | ICD-10-CM | POA: Insufficient documentation

## 2016-03-10 DIAGNOSIS — R109 Unspecified abdominal pain: Secondary | ICD-10-CM | POA: Insufficient documentation

## 2016-03-10 DIAGNOSIS — K2900 Acute gastritis without bleeding: Principal | ICD-10-CM | POA: Insufficient documentation

## 2016-03-10 DIAGNOSIS — F17210 Nicotine dependence, cigarettes, uncomplicated: Secondary | ICD-10-CM | POA: Insufficient documentation

## 2016-03-10 DIAGNOSIS — K219 Gastro-esophageal reflux disease without esophagitis: Secondary | ICD-10-CM | POA: Insufficient documentation

## 2016-03-10 LAB — COMPREHENSIVE METABOLIC PANEL
ALT: 24 U/L (ref 14–54)
AST: 22 U/L (ref 15–41)
Albumin: 4.6 g/dL (ref 3.5–5.0)
Alkaline Phosphatase: 92 U/L (ref 38–126)
Anion gap: 10 (ref 5–15)
BUN: 10 mg/dL (ref 6–20)
CHLORIDE: 105 mmol/L (ref 101–111)
CO2: 24 mmol/L (ref 22–32)
Calcium: 9.2 mg/dL (ref 8.9–10.3)
Creatinine, Ser: 0.58 mg/dL (ref 0.44–1.00)
Glucose, Bld: 143 mg/dL — ABNORMAL HIGH (ref 65–99)
POTASSIUM: 3.6 mmol/L (ref 3.5–5.1)
SODIUM: 139 mmol/L (ref 135–145)
Total Bilirubin: 0.7 mg/dL (ref 0.3–1.2)
Total Protein: 8.3 g/dL — ABNORMAL HIGH (ref 6.5–8.1)

## 2016-03-10 LAB — CBC
HEMATOCRIT: 47.1 % — AB (ref 35.0–47.0)
Hemoglobin: 15.9 g/dL (ref 12.0–16.0)
MCH: 29.8 pg (ref 26.0–34.0)
MCHC: 33.9 g/dL (ref 32.0–36.0)
MCV: 87.9 fL (ref 80.0–100.0)
Platelets: 314 10*3/uL (ref 150–440)
RBC: 5.35 MIL/uL — AB (ref 3.80–5.20)
RDW: 13.5 % (ref 11.5–14.5)
WBC: 20 10*3/uL — AB (ref 3.6–11.0)

## 2016-03-10 LAB — URINALYSIS COMPLETE WITH MICROSCOPIC (ARMC ONLY)
Bilirubin Urine: NEGATIVE
Glucose, UA: 50 mg/dL — AB
LEUKOCYTES UA: NEGATIVE
Nitrite: NEGATIVE
PH: 6 (ref 5.0–8.0)
PROTEIN: 100 mg/dL — AB
Specific Gravity, Urine: 1.027 (ref 1.005–1.030)

## 2016-03-10 LAB — LIPASE, BLOOD: LIPASE: 19 U/L (ref 11–51)

## 2016-03-10 MED ORDER — ONDANSETRON HCL 4 MG/2ML IJ SOLN
4.0000 mg | Freq: Once | INTRAMUSCULAR | Status: AC
  Administered 2016-03-10: 4 mg via INTRAVENOUS

## 2016-03-10 MED ORDER — PROMETHAZINE HCL 25 MG/ML IJ SOLN
25.0000 mg | Freq: Once | INTRAMUSCULAR | Status: AC
  Administered 2016-03-10: 25 mg via INTRAVENOUS
  Filled 2016-03-10: qty 1

## 2016-03-10 MED ORDER — FAMOTIDINE IN NACL 20-0.9 MG/50ML-% IV SOLN
INTRAVENOUS | Status: AC
  Filled 2016-03-10: qty 50

## 2016-03-10 MED ORDER — PROMETHAZINE HCL 25 MG/ML IJ SOLN
12.5000 mg | Freq: Once | INTRAMUSCULAR | Status: AC
  Administered 2016-03-10: 12.5 mg via INTRAVENOUS

## 2016-03-10 MED ORDER — PROMETHAZINE HCL 25 MG/ML IJ SOLN
INTRAMUSCULAR | Status: AC
  Filled 2016-03-10: qty 1

## 2016-03-10 MED ORDER — FAMOTIDINE IN NACL 20-0.9 MG/50ML-% IV SOLN
20.0000 mg | Freq: Two times a day (BID) | INTRAVENOUS | Status: DC
  Administered 2016-03-10: 20 mg via INTRAVENOUS
  Filled 2016-03-10 (×2): qty 50

## 2016-03-10 MED ORDER — SODIUM CHLORIDE 0.9 % IV BOLUS (SEPSIS)
1000.0000 mL | Freq: Once | INTRAVENOUS | Status: AC
  Administered 2016-03-10: 1000 mL via INTRAVENOUS

## 2016-03-10 MED ORDER — ONDANSETRON HCL 4 MG/2ML IJ SOLN
INTRAMUSCULAR | Status: AC
  Administered 2016-03-10: 4 mg via INTRAVENOUS
  Filled 2016-03-10: qty 2

## 2016-03-10 NOTE — ED Notes (Signed)
Patient is sleeping at this time. Family at bedside.

## 2016-03-10 NOTE — H&P (Signed)
[place] @ [facility] Admission History and Physical [employer], D.O.  ---------------------------------------------------------------------------------------------------------------------   PATIENT NAME: Jasmine Mason MR#: [id number] DATE OF BIRTH: Nov 08, 1985 DATE OF ADMISSION: 03/10/2016 PRIMARY CARE PHYSICIAN: Jules Schick, NP  REQUESTING/REFERRING PHYSICIAN: ED Dr. Corky Downs  CHIEF COMPLAINT: Chief Complaint  Patient presents with  . Emesis  . Abdominal Pain    HISTORY OF PRESENT ILLNESS: Jasmine Mason is a 30 y.o. female with a known history of Chronic abdominal pain, mesenteric adenitis, GERD was in a usual state of health until this afternoon when she reports gradual onset of nausea with multiple episodes of vomiting throughout the day. She reported that she had run out of her Prilosec recently. She denies abdominal pain at this time.  Of note patient has been seen multiple times over the past year in the emergency department for the same symptoms. She states that she has not followed up with a primary care doctor or gastroenterologist. She has had chronic leukocytosis as well.  Otherwise there has been no change in status. Patient has been taking medication as prescribed and there has been no recent change in medication or diet.  There has been no recent illness, travel or sick contacts.    Patient denies fevers/chills, weakness, dizziness, chest pain, shortness of breath, C/D, abdominal pain, dysuria/frequency, changes in mental status.   PAST MEDICAL HISTORY: Past Medical History:  Diagnosis Date  . Back pain   . Back pain   . GERD (gastroesophageal reflux disease)   . Mesenteric adenitis   . Obesity   . Tobacco abuse       PAST SURGICAL HISTORY: Past Surgical History:  Procedure Laterality Date  . CARPAL TUNNEL RELEASE Left ~ 2013  . ESOPHAGOGASTRODUODENOSCOPY N/A 01/25/2015   Procedure: ESOPHAGOGASTRODUODENOSCOPY (EGD);  Surgeon: Irene Shipper, MD;  Location: [facility] ENDOSCOPY;  Service: Endoscopy;  Laterality: N/A;  . FRACTURE SURGERY    . ORIF ANKLE FRACTURE Left 1990's      SOCIAL HISTORY: Social History  Substance Use Topics  . Smoking status: Current Every Day Smoker    Packs/day: 0.25    Years: 10.00    Types: Cigarettes  . Smokeless tobacco: Never Used  . Alcohol use No      FAMILY HISTORY: Family History  Problem Relation Age of Onset  . Hypertension Mother   . Hypertension Father   . Diabetes Father   . Heart disease Father   . Hypertension Sister      MEDICATIONS AT HOME: Prior to Admission medications   Medication Sig Start Date End Date Taking? Authorizing Provider  Omeprazole 20 MG TBEC Take 20 mg by mouth daily.    Yes Historical Provider, MD      DRUG ALLERGIES: No Known Allergies   REVIEW OF SYSTEMS: CONSTITUTIONAL: No fever/chills, fatigue, weakness, weight gain/loss, headache EYES: No blurry or double vision. ENT: No tinnitus, postnasal drip, redness or soreness of the oropharynx. RESPIRATORY: No cough, wheeze, hemoptysis, dyspnea. CARDIOVASCULAR: No chest pain, orthopnea, palpitations, syncope. GASTROINTESTINAL: Positive nausea, vomiting, negative constipation, diarrhea, abdominal pain, hematemesis, melena or hematochezia. GENITOURINARY: No dysuria or hematuria. ENDOCRINE: No polyuria or nocturia. No heat or cold intolerance. HEMATOLOGY: No anemia, bruising, bleeding. INTEGUMENTARY: No rashes, ulcers, lesions. MUSCULOSKELETAL: No arthritis, swelling, gout. NEUROLOGIC: No numbness, tingling, weakness or ataxia. No seizure-type activity. PSYCHIATRIC: No anxiety, depression, insomnia.  PHYSICAL EXAMINATION: VITAL SIGNS: Blood pressure (!) 186/96, pulse (!) 58, temperature 98.7 F (37.1 C), temperature source Oral, resp. rate 16, height 5'  7" (1.702 m), weight 96.2 kg (212 lb), last menstrual period 02/22/2016, SpO2 99 %.  GENERAL: 30 y.o.-year-old black female patient,  well-developed, well-nourished lying in the bed in no acute distress.  Pleasant and cooperative.   HEENT: Head atraumatic, normocephalic. Pupils equal, round, reactive to light and accommodation. No scleral icterus. Extraocular muscles intact. Nares are patent. Oropharynx is clear. Mucus membranes moist. NECK: Supple, full range of motion. No JVD, no bruit heard. No thyroid enlargement, no tenderness, no cervical lymphadenopathy. CHEST: Normal breath sounds bilaterally. No wheezing, rales, rhonchi or crackles. No use of accessory muscles of respiration.  No reproducible chest wall tenderness.  CARDIOVASCULAR: S1, S2 normal. No murmurs, rubs, or gallops. Cap refill <2 seconds. ABDOMEN: Soft, nontender, nondistended. No rebound, guarding, rigidity. Normoactive bowel sounds present in all four quadrants. No organomegaly or mass. EXTREMITIES: Full range of motion. No pedal edema, cyanosis, or clubbing. NEUROLOGIC: Cranial nerves II through XII are grossly intact with no focal sensorimotor deficit. Muscle strength 5/5 in all extremities. Sensation intact. Gait not checked. PSYCHIATRIC: The patient is alert and oriented x 3. Normal affect, mood, thought content. SKIN: Warm, dry, and intact without obvious rash, lesion, or ulcer.  LABORATORY PANEL:  CBC  Recent Labs Lab 03/10/16 1916  WBC 20.0*  HGB 15.9  HCT 47.1*  PLT 314   ----------------------------------------------------------------------------------------------------------------- Chemistries  Recent Labs Lab 03/10/16 1916  NA 139  K 3.6  CL 105  CO2 24  GLUCOSE 143*  BUN 10  CREATININE 0.58  CALCIUM 9.2  AST 22  ALT 24  ALKPHOS 92  BILITOT 0.7   ------------------------------------------------------------------------------------------------------------------ Cardiac Enzymes No results for input(s): TROPONINI in the last 168  hours. ------------------------------------------------------------------------------------------------------------------  RADIOLOGY: No results found.   IMPRESSION AND PLAN:  This is a 30 y.o. female with a history of Chronic abdominal pain, GERD, mesenteric adenitis now being admitted with: 1. Intractable nausea and vomiting-patient had received 3 doses of IV antiemetics in the emergency department and IV fluids and has persistent vomiting. We will admit for IV fluid hydration, IV antiemetics. I will request an abdominal ultrasound and GI consultation given her history of multiple emergency department visits for the same symptoms.  Continue PPI.  2. Leukocytosis, chronic-the patient does not have any symptoms that would suggest an active infection such as fevers, chills, abdominal pain or diarrhea. We'll monitor CBC and if warranted may check stool studies.  Diet/Nutrition: NPO Fluids: IVNS DVT Px: SCDs and early ambulation Code Status: Full  All the records are reviewed and case discussed with ED provider. Management plans discussed with the patient and/or family who express understanding and agree with plan of care.   TOTAL TIME TAKING CARE OF THIS PATIENT: 60 minutes.   Jasmine Mason D.O. on 03/10/2016 at 11:27 PM Between 7am to 6pm - Pager - [phone] After 6pm go to [email or web address] - [employer] [facility] Office [phone] CC: Primary care physician; Jules Schick, NP     Note: This dictation was prepared with Dragon dictation along with smaller phrase technology. Any transcriptional errors that result from this process are unintentional.

## 2016-03-10 NOTE — ED Notes (Signed)
Patient actively vomiting. PIV placed and Zofran 4mg  IVP given at this time. Corky Downs, MD to bedside for patient assessment.

## 2016-03-10 NOTE — ED Triage Notes (Signed)
Pt presents with vomiting and abd pain all day. Denies any other sx.

## 2016-03-10 NOTE — ED Provider Notes (Signed)
[facility] Emergency Department Provider Note   ____________________________________________    I have reviewed the triage vital signs and the nursing notes.   HISTORY  Chief Complaint Emesis and Abdominal Pain     HPI Jasmine Mason is a 30 y.o. female who presents with nausea and vomiting. She has mild discomfort in her upper abdomen as well. She reports numerous episodes of vomiting throughout the day. She has had this in the past as well and was diagnosed with gastritis. She reports she recently ran out of Prilosec and thinks that may have exacerbated this. She denies hematemesis. No fevers or chills. Normal stools. No sick contacts   Past Medical History:  Diagnosis Date  . Back pain   . Back pain   . GERD (gastroesophageal reflux disease)   . Mesenteric adenitis   . Obesity   . Tobacco abuse     Patient Active Problem List   Diagnosis Date Noted  . Abdominal pain, epigastric   . GERD (gastroesophageal reflux disease) 01/24/2015  . Hypokalemia 01/24/2015  . Abdominal pain 01/24/2015  . Leukocytosis 01/24/2015  . Chest pain 01/24/2015  . Cough 01/24/2015  . Pain of left calf 01/24/2015  . Elevated troponin 01/24/2015  . Obesity   . Mesenteric adenitis   . Tobacco abuse     Past Surgical History:  Procedure Laterality Date  . CARPAL TUNNEL RELEASE Left ~ 2013  . ESOPHAGOGASTRODUODENOSCOPY N/A 01/25/2015   Procedure: ESOPHAGOGASTRODUODENOSCOPY (EGD);  Surgeon: Irene Shipper, MD;  Location: [facility] ENDOSCOPY;  Service: Endoscopy;  Laterality: N/A;  . FRACTURE SURGERY    . ORIF ANKLE FRACTURE Left 1990's    Prior to Admission medications   Medication Sig Start Date End Date Taking? Authorizing Provider  amoxicillin (AMOXIL) 500 MG tablet Take 1 tablet (500 mg total) by mouth 3 (three) times daily. 07/22/15   Victorino Dike, FNP  lidocaine (LIDODERM) 5 % Place 1 patch onto the skin every 12 (twelve) hours. Remove & Discard patch within  12 hours or as directed by MD 11/13/15 11/12/16  Daymon Larsen, MD  LORazepam (ATIVAN) 1 MG tablet Take 1 tablet (1 mg total) by mouth every 8 (eight) hours as needed (muscle spasm). 11/13/15   Daymon Larsen, MD  methocarbamol (ROBAXIN) 500 MG tablet Take 1 tablet (500 mg total) by mouth 4 (four) times daily. 11/04/15   Charline Bills Cuthriell, PA-C  metoCLOPramide (REGLAN) 10 MG tablet Take 1 tablet (10 mg total) by mouth every 8 (eight) hours as needed. 01/14/16   Loney Hering, MD  naproxen (NAPROSYN) 500 MG tablet Take 1 tablet (500 mg total) by mouth 2 (two) times daily with a meal. 11/04/15   Charline Bills Cuthriell, PA-C  ondansetron (ZOFRAN ODT) 4 MG disintegrating tablet Take 1 tablet (4 mg total) by mouth every 8 (eight) hours as needed for nausea or vomiting. 11/17/15   Harvest Dark, MD  oxyCODONE-acetaminophen (ROXICET) 5-325 MG tablet Take 1 tablet by mouth every 6 (six) hours as needed. 11/17/15   Harvest Dark, MD  pantoprazole (PROTONIX) 40 MG tablet Take 1 tablet (40 mg total) by mouth daily. 11/17/15 11/16/16  Harvest Dark, MD  predniSONE (STERAPRED UNI-PAK 21 TAB) 10 MG (21) TBPK tablet 6 tablets on day 1, 5 tablets on day 2, 4 tablets on day 3, etc... 11/06/15   Mortimer Fries, PA-C  promethazine (PHENERGAN) 25 MG suppository Place 1 suppository (25 mg total) rectally every 6 (six) hours as needed for  nausea. 06/05/15 06/04/16  Schuyler Amor, MD  promethazine (PHENERGAN) 25 MG suppository Place 1 suppository (25 mg total) rectally every 6 (six) hours as needed for nausea. 01/15/16 01/14/17  Nance Pear, MD  ranitidine (ZANTAC) 150 MG capsule Take 150 mg by mouth every morning.    Historical Provider, MD  sucralfate (CARAFATE) 1 G tablet Take 1 tablet (1 g total) by mouth 4 (four) times daily. 06/03/15 06/02/16  Schuyler Amor, MD  sucralfate (CARAFATE) 1 g tablet Take 1 tablet (1 g total) by mouth 2 (two) times daily. 01/14/16   Loney Hering, MD  traMADol (ULTRAM) 50 MG  tablet Take 1 tablet (50 mg total) by mouth every 6 (six) hours as needed. 07/22/15   Victorino Dike, FNP     Allergies Review of patient's allergies indicates no known allergies.  Family History  Problem Relation Age of Onset  . Hypertension Mother   . Hypertension Father   . Diabetes Father   . Heart disease Father   . Hypertension Sister     Social History Social History  Substance Use Topics  . Smoking status: Current Every Day Smoker    Packs/day: 0.25    Years: 10.00    Types: Cigarettes  . Smokeless tobacco: Never Used  . Alcohol use No    Review of Systems  Constitutional: No fever/chills Eyes: No visual changes.  ENT: No sore throat. Cardiovascular: Denies chest pain. Respiratory: Denies shortness of breath. Gastrointestinal: As above  Genitourinary: Negative for dysuria. Musculoskeletal: Negative for back pain. Skin: Negative for rash. Neurological: Negative for headaches or weakness  10-point ROS otherwise negative.  ____________________________________________   PHYSICAL EXAM:  VITAL SIGNS: ED Triage Vitals  Enc Vitals Group     BP 03/10/16 1857 (!) 192/106     Pulse Rate 03/10/16 1857 69     Resp 03/10/16 1857 20     Temp 03/10/16 1857 98.7 F (37.1 C)     Temp Source 03/10/16 1857 Oral     SpO2 03/10/16 1857 100 %     Weight 03/10/16 1855 212 lb (96.2 kg)     Height 03/10/16 1855 5\' 7"  (1.702 m)     Head Circumference --      Peak Flow --      Pain Score 03/10/16 1855 9     Pain Loc --      Pain Edu? --      Excl. in [place]? --     Constitutional: Alert and oriented. No acute distress Although actively vomiting Eyes: Conjunctivae are normal.  Head: Atraumatic. Nose: No congestion/rhinnorhea. Mouth/Throat: Mucous membranes are moist.   Neck:  Painless ROM Cardiovascular: Normal rate, regular rhythm. Grossly normal heart sounds.  Respiratory: Normal respiratory effort.  No retractions. Lungs CTAB. Gastrointestinal: Soft and nontender.  No distention.  No CVA tenderness.Benign exam Genitourinary: deferred Musculoskeletal: No lower extremity tenderness nor edema.  Warm and well perfused Neurologic:  Normal speech and language. No gross focal neurologic deficits are appreciated.  Skin:  Skin is warm, dry and intact. No rash noted. Psychiatric: Mood and affect are normal. Speech and behavior are normal.  ____________________________________________   LABS (all labs ordered are listed, but only abnormal results are displayed)  Labs Reviewed  COMPREHENSIVE METABOLIC PANEL - Abnormal; Notable for the following:       Result Value   Glucose, Bld 143 (*)    Total Protein 8.3 (*)    All other components within normal limits  CBC -  Abnormal; Notable for the following:    WBC 20.0 (*)    RBC 5.35 (*)    HCT 47.1 (*)    All other components within normal limits  URINALYSIS COMPLETEWITH MICROSCOPIC ([facility] ONLY) - Abnormal; Notable for the following:    Color, Urine YELLOW (*)    APPearance HAZY (*)    Glucose, UA 50 (*)    Ketones, ur 1+ (*)    Hgb urine dipstick 1+ (*)    Protein, ur 100 (*)    Bacteria, UA RARE (*)    Squamous Epithelial / LPF 6-30 (*)    All other components within normal limits  LIPASE, BLOOD   ____________________________________________  EKG  ED ECG REPORT I, Lavonia Drafts, the attending physician, personally viewed and interpreted this ECG.  Date: 03/10/2016 EKG Time: 7:09 PM Rate: 59 Rhythm: Sinus bradycardia QRS Axis: normal Intervals: normal ST/T Wave abnormalities: normal Conduction Disturbances: none Narrative Interpretation: unremarkable  ____________________________________________  RADIOLOGY  None ____________________________________________   PROCEDURES  Procedure(s) performed: No    Critical Care performed: No ____________________________________________   INITIAL IMPRESSION / ASSESSMENT AND PLAN / ED COURSE  Pertinent labs & imaging results that were  available during my care of the patient were reviewed by me and considered in my medical decision making (see chart for details).  Patient presents with nausea and vomiting. She has had this several times in the past. Exam is benign, do not feel imaging is necessary. Patient has a chronically elevated white blood cell count. We will treat with IV antiemetics, fluids and reevaluate.  Clinical Course  Patient has had little improvement despite 3 doses of IV antiemetics, I will admit to the hospitalist for further management ____________________________________________   FINAL CLINICAL IMPRESSION(S) / ED DIAGNOSES  Final diagnoses:  Intractable vomiting with nausea, vomiting of unspecified type      NEW MEDICATIONS STARTED DURING THIS VISIT:  New Prescriptions   No medications on file     Note:  This document was prepared using Dragon voice recognition software and may include unintentional dictation errors.    Lavonia Drafts, MD 03/10/16 2225

## 2016-03-11 ENCOUNTER — Observation Stay: Payer: Self-pay

## 2016-03-11 DIAGNOSIS — R1110 Vomiting, unspecified: Secondary | ICD-10-CM

## 2016-03-11 LAB — BASIC METABOLIC PANEL
Anion gap: 9 (ref 5–15)
BUN: 9 mg/dL (ref 6–20)
CHLORIDE: 107 mmol/L (ref 101–111)
CO2: 23 mmol/L (ref 22–32)
Calcium: 8.8 mg/dL — ABNORMAL LOW (ref 8.9–10.3)
Creatinine, Ser: 0.71 mg/dL (ref 0.44–1.00)
GFR calc Af Amer: >60 mL/min (ref >60)
GFR calc non Af Amer: >60 mL/min (ref >60)
GLUCOSE: 116 mg/dL — AB (ref 65–99)
POTASSIUM: 3.5 mmol/L (ref 3.5–5.1)
SODIUM: 139 mmol/L (ref 135–145)

## 2016-03-11 LAB — CBC
HEMATOCRIT: 45.1 % (ref 35.0–47.0)
Hemoglobin: 15 g/dL (ref 12.0–16.0)
MCH: 29.4 pg (ref 26.0–34.0)
MCHC: 33.3 g/dL (ref 32.0–36.0)
MCV: 88.2 fL (ref 80.0–100.0)
Platelets: 323 10*3/uL (ref 150–440)
RBC: 5.11 MIL/uL (ref 3.80–5.20)
RDW: 13.5 % (ref 11.5–14.5)
WBC: 21.4 10*3/uL — AB (ref 3.6–11.0)

## 2016-03-11 MED ORDER — SODIUM CHLORIDE 0.9 % IV SOLN
INTRAVENOUS | Status: DC
  Administered 2016-03-11 – 2016-03-12 (×4): via INTRAVENOUS

## 2016-03-11 MED ORDER — PROMETHAZINE HCL 12.5 MG RE SUPP
12.5000 mg | Freq: Four times a day (QID) | RECTAL | Status: DC | PRN
  Filled 2016-03-11: qty 1

## 2016-03-11 MED ORDER — ONDANSETRON HCL 4 MG/2ML IJ SOLN
4.0000 mg | Freq: Four times a day (QID) | INTRAMUSCULAR | Status: DC | PRN
  Administered 2016-03-12 (×2): 4 mg via INTRAVENOUS
  Filled 2016-03-11 (×3): qty 2

## 2016-03-11 MED ORDER — ACETAMINOPHEN 325 MG PO TABS: 650.0000 mg | ORAL_TABLET | Freq: Four times a day (QID) | ORAL | Status: DC | PRN

## 2016-03-11 MED ORDER — TRAMADOL HCL 50 MG PO TABS
50.0000 mg | ORAL_TABLET | Freq: Three times a day (TID) | ORAL | Status: DC | PRN
  Administered 2016-03-11 – 2016-03-13 (×4): 50 mg via ORAL
  Filled 2016-03-11 (×6): qty 1

## 2016-03-11 MED ORDER — ONDANSETRON HCL 4 MG PO TABS
4.0000 mg | ORAL_TABLET | Freq: Four times a day (QID) | ORAL | Status: DC | PRN
  Administered 2016-03-11: 11:00:00 4 mg via ORAL
  Filled 2016-03-11: qty 1

## 2016-03-11 MED ORDER — PROMETHAZINE HCL 25 MG/ML IJ SOLN
25.0000 mg | Freq: Four times a day (QID) | INTRAMUSCULAR | Status: DC | PRN
  Administered 2016-03-11: 25 mg via INTRAVENOUS
  Filled 2016-03-11: qty 1

## 2016-03-11 MED ORDER — PANTOPRAZOLE SODIUM 40 MG PO TBEC
40.0000 mg | DELAYED_RELEASE_TABLET | Freq: Two times a day (BID) | ORAL | Status: DC
  Administered 2016-03-11 (×2): 40 mg via ORAL
  Filled 2016-03-11 (×2): qty 1

## 2016-03-11 NOTE — Progress Notes (Addendum)
[place] [facility] [place] NAME: Jasmine Mason    MR#:  [id number]  DATE OF BIRTH:  [birth year]  SUBJECTIVE:   Came in with abd pain and vomiting. Reports vomiting but none seen by RN or documented. Has been on and off to the ER for the same. Asking for IV phenergan REVIEW OF SYSTEMS:   Review of Systems  Constitutional: Negative for chills, fever and weight loss.  HENT: Negative for ear discharge, ear pain and nosebleeds.   Eyes: Negative for blurred vision, pain and discharge.  Respiratory: Negative for sputum production, shortness of breath, wheezing and stridor.   Cardiovascular: Negative for chest pain, palpitations, orthopnea and PND.  Gastrointestinal: Positive for abdominal pain, heartburn, nausea and vomiting. Negative for diarrhea.  Genitourinary: Negative for frequency and urgency.  Musculoskeletal: Negative for back pain and joint pain.  Neurological: Negative for sensory change, speech change, focal weakness and weakness.  Psychiatric/Behavioral: Negative for depression and hallucinations. The patient is not nervous/anxious.    Tolerating Diet:water Tolerating PT: ambulatory  DRUG ALLERGIES:  No Known Allergies  VITALS:  Blood pressure 122/73, pulse 80, temperature 98 F (36.7 C), temperature source Oral, resp. rate 20, height 5\' 7"  (1.702 m), weight 221 lb 12.8 oz (100.6 kg), last menstrual period 02/22/2016, SpO2 100 %.  PHYSICAL EXAMINATION:   Physical Exam  GENERAL:  30 y.o.-year-old patient lying in the bed with no acute distress. Obese EYES: Pupils equal, round, reactive to light and accommodation. No scleral icterus. Extraocular muscles intact.  HEENT: Head atraumatic, normocephalic. Oropharynx and nasopharynx clear.  NECK:  Supple, no jugular venous distention. No thyroid enlargement, no tenderness.  LUNGS: Normal breath sounds bilaterally, no wheezing, rales, rhonchi. No use of accessory muscles of respiration.   CARDIOVASCULAR: S1, S2 normal. No murmurs, rubs, or gallops.  ABDOMEN: Soft, nontender, nondistended. Bowel sounds present. No organomegaly or mass.  EXTREMITIES: No cyanosis, clubbing or edema b/l.    NEUROLOGIC: Cranial nerves II through XII are intact. No focal Motor or sensory deficits b/l.   PSYCHIATRIC:  patient is alert and oriented x 3.  SKIN: No obvious rash, lesion, or ulcer.   LABORATORY PANEL:  CBC  Recent Labs Lab 03/11/16 0610  WBC 21.4*  HGB 15.0  HCT 45.1  PLT 323    Chemistries   Recent Labs Lab 03/10/16 1916 03/11/16 0610  NA 139 139  K 3.6 3.5  CL 105 107  CO2 24 23  GLUCOSE 143* 116*  BUN 10 9  CREATININE 0.58 0.71  CALCIUM 9.2 8.8*  AST 22  --   ALT 24  --   ALKPHOS 92  --   BILITOT 0.7  --    Cardiac Enzymes No results for input(s): TROPONINI in the last 168 hours. RADIOLOGY:  US Abdomen Complete  Result Date: 03/11/2016 CLINICAL DATA:  Nausea and vomiting for 2 days. EXAM: ABDOMEN ULTRASOUND COMPLETE COMPARISON:  CT 01/14/2016 FINDINGS: Gallbladder: No gallstones or wall thickening visualized. No sonographic Murphy sign noted by sonographer. Common bile duct: Diameter: Normal, 3 mm. Liver: No focal lesion identified. Within normal limits in parenchymal echogenicity. IVC: No abnormality visualized. Pancreas: Poorly visualized due to overlying bowel gas. Spleen: Size and appearance within normal limits. Right Kidney: Length: 12.7. Echogenicity within normal limits. No mass or hydronephrosis visualized. Left Kidney: Length: 11.7. Echogenicity within normal limits. No mass or hydronephrosis visualized. Abdominal aorta: No aneurysm visualized. Other findings: No ascites. IMPRESSION: No acute process or explanation for nausea/vomiting.  Electronically Signed   By: Abigail Miyamoto M.D.   On: 03/11/2016 09:08   ASSESSMENT AND PLAN:  30 y.o. female with a history of Chronic abdominal pain, GERD, mesenteric adenitis now being admitted with: 1. Intractable  nausea and vomiting -pt has history of multiple emergency department visits for the same symptoms.  - Continue PPI per GI -she had normal EGD last year -several CT abdomens are negative [place] abd negative -?cyclical vomiting -Denies any stressors -started on CLD -pt has very small IV line. So will avoid IV phenergan  2. Leukocytosis, chronic-the patient does not have any symptoms that would suggest an active infection such as fevers, chills, abdominal pain or diarrhea.  Case discussed with Care Management/Social Worker. Management plans discussed with the patient, family and they are in agreement.  CODE STATUS:full  DVT Prophylaxis: lovenox TOTAL TIME TAKING CARE OF THIS PATIENT: 30 minutes.  >50% time spent on counselling and coordination of care  POSSIBLE D/C IN 1 DAYS, DEPENDING ON CLINICAL CONDITION.  Note: This dictation was prepared with Dragon dictation along with smaller phrase technology. Any transcriptional errors that result from this process are unintentional.  Damian Buckles M.D on 03/11/2016 at 3:23 PM  Between 7am to 6pm - Pager - [phone]  After 6pm go to [email or web address] - password [email or web address] [facility]  Office  [phone]  CC: Primary care physician; Jules Schick, NP

## 2016-03-11 NOTE — Plan of Care (Signed)
Problem: Pain Managment: Goal: General experience of comfort will improve Outcome: Progressing Pt c/o epigastric pain once. Tramadol given with improvement.  Problem: Fluid Volume: Goal: Ability to maintain a balanced intake and output will improve Outcome: Progressing Zofran given once for nausea with improvement. No emesis noted. IVF infusing. Fair PO intake. Pt encouraged to drink fluids.  Problem: Nutrition: Goal: Adequate nutrition will be maintained Outcome: Progressing As above

## 2016-03-11 NOTE — ED Notes (Signed)
Pt transported to room 114 1C

## 2016-03-11 NOTE — Consult Note (Signed)
Lucilla Lame, MD [address] [place], [place] [address] Phone: [phone] Fax : [phone]  Consultation  Referring Provider:     No ref. provider found Primary Care Physician:  Jules Schick, NP Primary Gastroenterologist:  None         Reason for Consultation:     Nausea and vomiting  Date of Admission:  03/10/2016 Date of Consultation:  03/11/2016         HPI:   Jasmine Mason is a 30 y.o. female who has a history of nausea and vomiting with a episode of this last year in July. At that time the [facility] and had an upper endoscopy by Dr. Leslee Home. The patient was found to have a normal EGD and was started on Protonix 40 mg. The patient states she has been doing well up until 2 days ago when she started having nausea and vomiting again. The patient also reports that she had ran out of her Protonix. She did not attempt to have the prescription refilled and just stopped taking it. The patient is now reporting that she vomits up acid. It is very hard to obtain a history from this patient since she is prone to give 1 word answers and not very engaging in our conversation. She reports that she has diffuse abdominal discomfort. The patient had an ultrasound which was normal today. The patient also had a CT scan in June for vomiting at that time and was found to have no cause for her symptoms seen. 2 months prior to that the patient also had a another CT scan for vomiting 1 day. At that time the patient was also found to have a CT showing no acute process within the abdomen or pelvis. Prior to that on 06/04/2015 the patient had 3 days of vomiting with a CT scan again showing no acute findings or cause for her symptoms.  Past Medical History:  Diagnosis Date  . Back pain   . Back pain   . GERD (gastroesophageal reflux disease)   . Mesenteric adenitis   . Obesity   . Tobacco abuse     Past Surgical History:  Procedure Laterality Date  . CARPAL TUNNEL  RELEASE Left ~ 2013  . ESOPHAGOGASTRODUODENOSCOPY N/A 01/25/2015   Procedure: ESOPHAGOGASTRODUODENOSCOPY (EGD);  Surgeon: Irene Shipper, MD;  [facility] ENDOSCOPY;  Service: Endoscopy;  Laterality: N/A;  . FRACTURE SURGERY    . ORIF ANKLE FRACTURE Left 1990's    Prior to Admission medications   Medication Sig Start Date End Date Taking? Authorizing Provider  Omeprazole 20 MG TBEC Take 20 mg by mouth daily.    Yes Historical Provider, MD    Family History  Problem Relation Age of Onset  . Hypertension Mother   . Hypertension Father   . Diabetes Father   . Heart disease Father   . Hypertension Sister      Social History  Substance Use Topics  . Smoking status: Current Every Day Smoker    Packs/day: 0.25    Years: 10.00    Types: Cigarettes  . Smokeless tobacco: Never Used  . Alcohol use No    Allergies as of 03/10/2016  . (No Known Allergies)    Review of Systems:    All systems reviewed and negative except where noted in HPI.   Physical Exam:  Vital signs in last 24 hours: Temp:  [98.7 F (37.1 C)-99.1 F (37.3 C)] 99.1 F (37.3 C) (08/20  [id number]) Pulse Rate:  [58-87] 67 (08/20 0527) Resp:  [16-20] 18 (08/20 0527) BP: (149-192)/(79-106) 149/87 (08/20 0527) SpO2:  [99 %-100 %] 100 % (08/20 0527) Weight:  [212 lb (96.2 kg)-221 lb 12.8 oz (100.6 kg)] 221 lb 12.8 oz (100.6 kg) (08/20 0042)   General:   Pleasant, cooperative in NAD Head:  Normocephalic and atraumatic. Eyes:   No icterus.   Conjunctiva pink. PERRLA. Ears:  Normal auditory acuity. Neck:  Supple; no masses or thyroidomegaly Lungs: Respirations even and unlabored. Lungs clear to auscultation bilaterally.   No wheezes, crackles, or rhonchi.  Heart:  Regular rate and rhythm;  Without murmur, clicks, rubs or gallops Abdomen:  Soft, nondistended, nontender. Normal bowel sounds. No appreciable masses or hepatomegaly.  No rebound or guarding.  Rectal:  Not performed. Msk:  Symmetrical without gross deformities.     Extremities:  Without edema, cyanosis or clubbing. Neurologic:  Alert and oriented x3;  grossly normal neurologically. Skin:  Intact without significant lesions or rashes. Cervical Nodes:  No significant cervical adenopathy. Psych:  Alert and cooperative. Normal affect.  LAB RESULTS:  Recent Labs  03/10/16 1916 03/11/16 0610  WBC 20.0* 21.4*  HGB 15.9 15.0  HCT 47.1* 45.1  PLT 314 323   BMET  Recent Labs  03/10/16 1916 03/11/16 0610  NA 139 139  K 3.6 3.5  CL 105 107  CO2 24 23  GLUCOSE 143* 116*  BUN 10 9  CREATININE 0.58 0.71  CALCIUM 9.2 8.8*   LFT  Recent Labs  03/10/16 1916  PROT 8.3*  ALBUMIN 4.6  AST 22  ALT 24  ALKPHOS 92  BILITOT 0.7   PT/INR No results for input(s): LABPROT, INR in the last 72 hours.  STUDIES: US Abdomen Complete  Result Date: 03/11/2016 CLINICAL DATA:  Nausea and vomiting for 2 days. EXAM: ABDOMEN ULTRASOUND COMPLETE COMPARISON:  CT 01/14/2016 FINDINGS: Gallbladder: No gallstones or wall thickening visualized. No sonographic Murphy sign noted by sonographer. Common bile duct: Diameter: Normal, 3 mm. Liver: No focal lesion identified. Within normal limits in parenchymal echogenicity. IVC: No abnormality visualized. Pancreas: Poorly visualized due to overlying bowel gas. Spleen: Size and appearance within normal limits. Right Kidney: Length: 12.7. Echogenicity within normal limits. No mass or hydronephrosis visualized. Left Kidney: Length: 11.7. Echogenicity within normal limits. No mass or hydronephrosis visualized. Abdominal aorta: No aneurysm visualized. Other findings: No ascites. IMPRESSION: No acute process or explanation for nausea/vomiting. Electronically Signed   By: Abigail Miyamoto M.D.   On: 03/11/2016 09:08      Impression / Plan:   Jasmine Mason is a 30 y.o. y/o female with has recurrent nausea and vomiting with multiple CT scans and ultrasounds without showing any Cause for her symptoms. The patient also had an EGD for  the same thing a year ago that was normal. If this patient's nausea and vomiting is from a GI source than her reflux is most likely the cause. Within normal EGD for the same issues a year ago it is unlikely that this is a GI cause for her symptoms. I would still consider treating the patient with a PPI twice a day and I have written that order. There is nothing further to add from a GI point of view except for acid control and antiemetics.  Thank you for involving me in the care of this patient.      LOS: 0 days   Lucilla Lame, MD  03/11/2016, 10:32 AM   Note: This dictation was prepared  with Dragon dictation along with smaller phrase technology. Any transcriptional errors that result from this process are unintentional.

## 2016-03-12 LAB — PH, GASTRIC FLUID (GASTROCCULT CARD): pH, Gastric: 6

## 2016-03-12 MED ORDER — HYDRALAZINE HCL 20 MG/ML IJ SOLN
10.0000 mg | Freq: Four times a day (QID) | INTRAMUSCULAR | Status: DC | PRN
  Administered 2016-03-12: 16:00:00 10 mg via INTRAVENOUS
  Filled 2016-03-12: qty 1

## 2016-03-12 MED ORDER — SODIUM CHLORIDE 0.9 % IV SOLN
INTRAVENOUS | Status: DC
Start: 2016-03-12 — End: 2016-03-13
  Administered 2016-03-12 – 2016-03-13 (×2): via INTRAVENOUS

## 2016-03-12 MED ORDER — MORPHINE SULFATE (PF) 2 MG/ML IV SOLN
1.0000 mg | Freq: Once | INTRAVENOUS | Status: AC
  Administered 2016-03-12: 19:00:00 1 mg via INTRAVENOUS
  Filled 2016-03-12: qty 1

## 2016-03-12 MED ORDER — METOCLOPRAMIDE HCL 5 MG/ML IJ SOLN
10.0000 mg | Freq: Three times a day (TID) | INTRAMUSCULAR | Status: DC | PRN
  Administered 2016-03-12 – 2016-03-13 (×3): 10 mg via INTRAVENOUS
  Filled 2016-03-12 (×3): qty 2

## 2016-03-12 MED ORDER — MORPHINE SULFATE (PF) 2 MG/ML IV SOLN
1.0000 mg | Freq: Two times a day (BID) | INTRAVENOUS | Status: DC | PRN
  Administered 2016-03-12: 1 mg via INTRAVENOUS
  Filled 2016-03-12: qty 1

## 2016-03-12 MED ORDER — BISACODYL 10 MG RE SUPP
10.0000 mg | Freq: Once | RECTAL | Status: AC
  Administered 2016-03-12: 10 mg via RECTAL
  Filled 2016-03-12: qty 1

## 2016-03-12 MED ORDER — SODIUM CHLORIDE 0.9 % IV SOLN
Freq: Once | INTRAVENOUS | Status: AC
  Administered 2016-03-12: 14:00:00 via INTRAVENOUS

## 2016-03-12 MED ORDER — PANTOPRAZOLE SODIUM 40 MG PO TBEC
40.0000 mg | DELAYED_RELEASE_TABLET | Freq: Every day | ORAL | Status: DC
  Administered 2016-03-12 – 2016-03-13 (×2): 40 mg via ORAL
  Filled 2016-03-12 (×2): qty 1

## 2016-03-12 MED ORDER — PANTOPRAZOLE SODIUM 40 MG PO TBEC: 40.0000 mg | DELAYED_RELEASE_TABLET | Freq: Every day | ORAL | 1 refills | Status: DC

## 2016-03-12 MED ORDER — ACETAMINOPHEN 325 MG PO TABS: 650.0000 mg | ORAL_TABLET | Freq: Four times a day (QID) | ORAL | Status: DC | PRN

## 2016-03-12 NOTE — Progress Notes (Signed)
[facility] at [place] NAME: Jasmine [facility]    MR#:  [id number]  DATE OF BIRTH:  [birth year]  SUBJECTIVE:  Pt was feeling better  But now has been having vomiting  Since am x2 Now abd pain  REVIEW OF SYSTEMS:   Review of Systems  Constitutional: Negative for chills, fever and weight loss.  HENT: Negative for ear discharge, ear pain and nosebleeds.   Eyes: Negative for blurred vision, pain and discharge.  Respiratory: Negative for sputum production, shortness of breath, wheezing and stridor.   Cardiovascular: Negative for chest pain, palpitations, orthopnea and PND.  Gastrointestinal: Positive for abdominal pain and vomiting. Negative for diarrhea and nausea.  Genitourinary: Negative for frequency and urgency.  Musculoskeletal: Negative for back pain and joint pain.  Neurological: Positive for weakness. Negative for sensory change, speech change and focal weakness.  Psychiatric/Behavioral: Negative for depression and hallucinations. The patient is not nervous/anxious.    Tolerating Diet: CLD Tolerating PT: ambulatory  DRUG ALLERGIES:  No Known Allergies  VITALS:  Blood pressure (!) 134/91, pulse 84, temperature 98.4 F (36.9 C), temperature source Oral, resp. rate 19, height 5\' 7"  (1.702 m), weight 221 lb 12.8 oz (100.6 kg), last menstrual period 02/22/2016, SpO2 100 %.  PHYSICAL EXAMINATION:   Physical Exam  GENERAL:  30 y.o.-year-old patient lying in the bed with no acute distress.  EYES: Pupils equal, round, reactive to light and accommodation. No scleral icterus. Extraocular muscles intact.  HEENT: Head atraumatic, normocephalic. Oropharynx and nasopharynx clear.  NECK:  Supple, no jugular venous distention. No thyroid enlargement, no tenderness.  LUNGS: Normal breath sounds bilaterally, no wheezing, rales, rhonchi. No use of accessory muscles of respiration.  CARDIOVASCULAR: S1, S2 normal. No murmurs, rubs, or gallops.   ABDOMEN: Soft, nontender, nondistended. Bowel sounds present. No organomegaly or mass.  EXTREMITIES: No cyanosis, clubbing or edema b/l.    NEUROLOGIC: Cranial nerves II through XII are intact. No focal Motor or sensory deficits b/l.   PSYCHIATRIC:  patient is alert and oriented x 3.  SKIN: No obvious rash, lesion, or ulcer.   LABORATORY PANEL:  CBC  Recent Labs Lab 03/11/16 0610  WBC 21.4*  HGB 15.0  HCT 45.1  PLT 323    Chemistries   Recent Labs Lab 03/10/16 1916 03/11/16 0610  NA 139 139  K 3.6 3.5  CL 105 107  CO2 24 23  GLUCOSE 143* 116*  BUN 10 9  CREATININE 0.58 0.71  CALCIUM 9.2 8.8*  AST 22  --   ALT 24  --   ALKPHOS 92  --   BILITOT 0.7  --    Cardiac Enzymes No results for input(s): TROPONINI in the last 168 hours. RADIOLOGY:  US Abdomen Complete  Result Date: 03/11/2016 CLINICAL DATA:  Nausea and vomiting for 2 days. EXAM: ABDOMEN ULTRASOUND COMPLETE COMPARISON:  CT 01/14/2016 FINDINGS: Gallbladder: No gallstones or wall thickening visualized. No sonographic Murphy sign noted by sonographer. Common bile duct: Diameter: Normal, 3 mm. Liver: No focal lesion identified. Within normal limits in parenchymal echogenicity. IVC: No abnormality visualized. Pancreas: Poorly visualized due to overlying bowel gas. Spleen: Size and appearance within normal limits. Right Kidney: Length: 12.7. Echogenicity within normal limits. No mass or hydronephrosis visualized. Left Kidney: Length: 11.7. Echogenicity within normal limits. No mass or hydronephrosis visualized. Abdominal aorta: No aneurysm visualized. Other findings: No ascites. IMPRESSION: No acute process or explanation for nausea/vomiting. Electronically Signed   By: Adria Devon.D.  On: 03/11/2016 09:08   ASSESSMENT AND PLAN:   30 y.o.femalewith a history of Chronic abdominal pain, GERD, mesenteric adenitisnow being admitted with: 1. Intractable nausea and vomiting due to gastritis/GERD -pt hashistory of  multiple emergency department visits for the same symptoms.  -Continue PPI per GI -she had normal EGD last year -several CT abdomens are negative [place] abd negative -Denies any stressors -started on CLD but now vomiting again. Will hold off discharge  For now  2. Leukocytosis, chronic-the patient does not have any symptoms that would suggest an active infection such as fevers, chills, abdominal pain or diarrhea.   Case discussed with Care Management/Social Worker. Management plans discussed with the patient, family and they are in agreement.  CODE STATUS: Full  DVT Prophylaxis: Lovenox TOTAL TIME TAKING CARE OF THIS PATIENT: 30 minutes.  >50% time spent on counselling and coordination of care  POSSIBLE D/C IN 1-2 DAYS, DEPENDING ON CLINICAL CONDITION.  Note: This dictation was prepared with Dragon dictation along with smaller phrase technology. Any transcriptional errors that result from this process are unintentional.  Kanisha Duba M.D on 03/12/2016 at 1:13 PM  Between 7am to 6pm - Pager - [phone]  After 6pm go to [email or web address] - password [email or web address] [facility]  Office  [phone]  CC: Primary care physician; Jules Schick, NP

## 2016-03-12 NOTE — Plan of Care (Signed)
Problem: Physical Regulation: Goal: Ability to maintain clinical measurements within normal limits will improve Outcome: Not Progressing Pt unable to tolerate clear liquid diet. C/o epigastric pain/n/v during the day. Pt vomited few times. Zofran and reglan given, nausea decreased. Tramadol and morphine given with some improvement. MD is aware, placed an order for NG tube.  Problem: Nutrition: Goal: Adequate nutrition will be maintained Outcome: Not Progressing As above.  Problem: Bowel/Gastric: Goal: Will not experience complications related to bowel motility Outcome: Not Progressing Pt c/o constipation, dulcolax supp given. No BM per pt.

## 2016-03-12 NOTE — Discharge Summary (Addendum)
[facility] at [place] NAME: Jasmine Mason    MR#:  [id number]  DATE OF BIRTH:  07-Mar-1986  DATE OF ADMISSION:  03/10/2016              ADMITTING PHYSICIAN: Harvie Bridge, DO  DATE OF DISCHARGE: 03/12/16  PRIMARY CARE PHYSICIAN: Mercie Eon F, NP    ADMISSION DIAGNOSIS:  Intractable vomiting with nausea, vomiting of unspecified type [R11.10]  DISCHARGE DIAGNOSIS:  Nausea vomiting due to acute gastritis/GERD Chronic leucocytosis  SECONDARY DIAGNOSIS:       Past Medical History:  Diagnosis Date  . Back pain   . Back pain   . GERD (gastroesophageal reflux disease)   . Mesenteric adenitis   . Obesity   . Tobacco abuse     HOSPITAL COURSE:   30 y.o.femalewith a history of Chronic abdominal pain, GERD, mesenteric adenitisnow being admitted with: 1. Intractable nausea and vomiting due to gastritis/GERD -pt hashistory of multiple emergency department visits for the same symptoms.  -Continue PPI per GI -she had normal EGD last year -several CT abdomens are negative [place] abd negative -Denies any stressors -started on CLD---to soft -doing well today  2. Leukocytosis, chronic-the patient does not have any symptoms that would suggest an active infection such as fevers, chills, abdominal pain or diarrhea.   Overall stable D/c home CONSULTS OBTAINED:  Treatment Team:  Lucilla Lame, MD  DRUG ALLERGIES:  No Known Allergies  DISCHARGE MEDICATIONS:      Current Discharge Medication List  Protonix Take 40 mg po daily     CONTINUE these medications which have NOT CHANGED   Details  Omeprazole 20 MG TBEC Take 20 mg by mouth daily.

## 2016-03-12 NOTE — Progress Notes (Signed)
[facility] at [place] NAME: Jasmine Mason    MR#:  [id number]  DATE OF BIRTH:  01/24/1986  DATE OF ADMISSION:  03/10/2016 ADMITTING PHYSICIAN: Harvie Bridge, DO  DATE OF DISCHARGE: 03/12/16  PRIMARY CARE PHYSICIAN: Mercie Eon F, NP    ADMISSION DIAGNOSIS:  Intractable vomiting with nausea, vomiting of unspecified type [R11.10]  DISCHARGE DIAGNOSIS:  Nausea vomiting due to acute gastritis/GERD Chronic leucocytosis  SECONDARY DIAGNOSIS:   Past Medical History:  Diagnosis Date  . Back pain   . Back pain   . GERD (gastroesophageal reflux disease)   . Mesenteric adenitis   . Obesity   . Tobacco abuse     HOSPITAL COURSE:   30 y.o.femalewith a history of Chronic abdominal pain, GERD, mesenteric adenitisnow being admitted with: 1. Intractable nausea and vomiting due to gastritis/GERD -pt has history of multiple emergency department visits for the same symptoms.  -Continue PPI per GI -she had normal EGD last year -several CT abdomens are negative [place] abd negative -Denies any stressors -started on CLD---to soft -doing well today  2. Leukocytosis, chronic-the patient does not have any symptoms that would suggest an active infection such as fevers, chills, abdominal pain or diarrhea.   Overall stable D/c home CONSULTS OBTAINED:  Treatment Team:  Lucilla Lame, MD  DRUG ALLERGIES:  No Known Allergies  DISCHARGE MEDICATIONS:   Current Discharge Medication List    CONTINUE these medications which have NOT CHANGED   Details  Omeprazole 20 MG TBEC Take 20 mg by mouth daily.         If you experience worsening of your admission symptoms, develop shortness of breath, life threatening emergency, suicidal or homicidal thoughts you must seek medical attention immediately by calling 911 or calling your MD immediately  if symptoms less severe.  You Must read complete instructions/literature along with all the  possible adverse reactions/side effects for all the Medicines you take and that have been prescribed to you. Take any new Medicines after you have completely understood and accept all the possible adverse reactions/side effects.   Please note  You were cared for by a hospitalist during your hospital stay. If you have any questions about your discharge medications or the care you received while you were in the hospital after you are discharged, you can call the unit and asked to speak with the hospitalist on call if the hospitalist that took care of you is not available. Once you are discharged, your primary care physician will handle any further medical issues. Please note that NO REFILLS for any discharge medications will be authorized once you are discharged, as it is imperative that you return to your primary care physician (or establish a relationship with a primary care physician if you do not have one) for your aftercare needs so that they can reassess your need for medications and monitor your lab values. Today   SUBJECTIVE   Doing well. Wants to try soft diet  VITAL SIGNS:  Blood pressure (!) 134/91, pulse 84, temperature 98.4 F (36.9 C), temperature source Oral, resp. rate 19, height 5\' 7"  (1.702 m), weight 221 lb 12.8 oz (100.6 kg), last menstrual period 02/22/2016, SpO2 100 %.  I/O:   Intake/Output Summary (Last 24 hours) at 03/12/16 0809 Last data filed at 03/12/16 0546  Gross per 24 hour  Intake             1750 ml  Output  0 ml  Net             1750 ml    PHYSICAL EXAMINATION:  GENERAL:  30 y.o.-year-old patient lying in the bed with no acute distress. obese EYES: Pupils equal, round, reactive to light and accommodation. No scleral icterus. Extraocular muscles intact.  HEENT: Head atraumatic, normocephalic. Oropharynx and nasopharynx clear.  NECK:  Supple, no jugular venous distention. No thyroid enlargement, no tenderness.  LUNGS: Normal breath sounds  bilaterally, no wheezing, rales,rhonchi or crepitation. No use of accessory muscles of respiration.  CARDIOVASCULAR: S1, S2 normal. No murmurs, rubs, or gallops.  ABDOMEN: Soft, non-tender, non-distended. Bowel sounds present. No organomegaly or mass.  EXTREMITIES: No pedal edema, cyanosis, or clubbing.  NEUROLOGIC: Cranial nerves II through XII are intact. Muscle strength 5/5 in all extremities. Sensation intact. Gait not checked.  PSYCHIATRIC: The patient is alert and oriented x 3.  SKIN: No obvious rash, lesion, or ulcer.   DATA REVIEW:   CBC   Recent Labs Lab 03/11/16 0610  WBC 21.4*  HGB 15.0  HCT 45.1  PLT 323    Chemistries   Recent Labs Lab 03/10/16 1916 03/11/16 0610  NA 139 139  K 3.6 3.5  CL 105 107  CO2 24 23  GLUCOSE 143* 116*  BUN 10 9  CREATININE 0.58 0.71  CALCIUM 9.2 8.8*  AST 22  --   ALT 24  --   ALKPHOS 92  --   BILITOT 0.7  --     Microbiology Results   No results found for this or any previous visit (from the past 240 hour(s)).  RADIOLOGY:  US Abdomen Complete  Result Date: 03/11/2016 CLINICAL DATA:  Nausea and vomiting for 2 days. EXAM: ABDOMEN ULTRASOUND COMPLETE COMPARISON:  CT 01/14/2016 FINDINGS: Gallbladder: No gallstones or wall thickening visualized. No sonographic Murphy sign noted by sonographer. Common bile duct: Diameter: Normal, 3 mm. Liver: No focal lesion identified. Within normal limits in parenchymal echogenicity. IVC: No abnormality visualized. Pancreas: Poorly visualized due to overlying bowel gas. Spleen: Size and appearance within normal limits. Right Kidney: Length: 12.7. Echogenicity within normal limits. No mass or hydronephrosis visualized. Left Kidney: Length: 11.7. Echogenicity within normal limits. No mass or hydronephrosis visualized. Abdominal aorta: No aneurysm visualized. Other findings: No ascites. IMPRESSION: No acute process or explanation for nausea/vomiting. Electronically Signed   By: Abigail Miyamoto M.D.   On:  03/11/2016 09:08     Management plans discussed with the patient, family and they are in agreement.  CODE STATUS:     Code Status Orders        Start     Ordered   03/11/16 0047  Full code  Continuous     03/11/16 0046    Code Status History    Date Active Date Inactive Code Status Order ID Comments User Context   01/24/2015  3:06 AM 01/25/2015  6:30 PM Full Code [id number]  Ivor Costa, MD Inpatient      TOTAL TIME TAKING CARE OF THIS PATIENT: 40 minutes.    Tenasia Aull M.D on 03/12/2016 at 8:09 AM  Between 7am to 6pm - Pager - [phone] After 6pm go to [email or web address] - password [email or web address] [facility]  Office  [phone]  CC: Primary care physician; Jules Schick, NP

## 2016-03-12 NOTE — Progress Notes (Signed)
Pt reports no further nausea or vomiting.  Pt tolerated a frozen pop sickle without any difficulty.  Pt resting comfortably with family at bedside. Dorna Bloom RN

## 2016-03-12 NOTE — Progress Notes (Addendum)
Order to place NG tube.  Pt refusing at this time.  Pt wants to wait at this time. Notified Dr Lavetta Nielsen and ok to give reglan 1 hour early.  Pt with 200 cc clear emesis since 6 PM.  Dorna Bloom RN

## 2016-03-13 ENCOUNTER — Observation Stay: Payer: Self-pay | Admitting: Anesthesiology

## 2016-03-13 ENCOUNTER — Encounter: Payer: Self-pay | Admitting: *Deleted

## 2016-03-13 ENCOUNTER — Encounter
Admission: EM | Disposition: A | Discharge status: Home / Self Care | Payer: Self-pay | Source: Home / Self Care | Attending: Emergency Medicine

## 2016-03-13 DIAGNOSIS — K449 Diaphragmatic hernia without obstruction or gangrene: Secondary | ICD-10-CM

## 2016-03-13 DIAGNOSIS — R112 Nausea with vomiting, unspecified: Secondary | ICD-10-CM

## 2016-03-13 HISTORY — PX: ESOPHAGOGASTRODUODENOSCOPY (EGD) WITH PROPOFOL: SHX5813

## 2016-03-13 LAB — PREGNANCY, URINE: Preg Test, Ur: NEGATIVE

## 2016-03-13 SURGERY — ESOPHAGOGASTRODUODENOSCOPY (EGD) WITH PROPOFOL
Anesthesia: General

## 2016-03-13 MED ORDER — ONDANSETRON HCL 4 MG PO TABS: 4.0000 mg | ORAL_TABLET | Freq: Three times a day (TID) | ORAL | 0 refills | Status: DC | PRN

## 2016-03-13 MED ORDER — PROPOFOL 10 MG/ML IV BOLUS
INTRAVENOUS | Status: DC | PRN
  Administered 2016-03-13: 100 mg via INTRAVENOUS
  Administered 2016-03-13: 40 mg via INTRAVENOUS

## 2016-03-13 MED ORDER — PROPOFOL 500 MG/50ML IV EMUL
INTRAVENOUS | Status: DC | PRN
  Administered 2016-03-13: 180 ug/kg/min via INTRAVENOUS

## 2016-03-13 NOTE — Anesthesia Postprocedure Evaluation (Signed)
Anesthesia Post Note  Patient: Jasmine Mason  Procedure(s) Performed: Procedure(s) (LRB): ESOPHAGOGASTRODUODENOSCOPY (EGD) WITH PROPOFOL (N/A)  Patient location during evaluation: PACU Anesthesia Type: General Level of consciousness: awake and alert and oriented Pain management: pain level controlled Vital Signs Assessment: post-procedure vital signs reviewed and stable Respiratory status: spontaneous breathing, nonlabored ventilation and respiratory function stable Cardiovascular status: blood pressure returned to baseline and stable Postop Assessment: no signs of nausea or vomiting Anesthetic complications: no    Last Vitals:  Vitals:   03/13/16 1245 03/13/16 1305  BP: 122/82 138/88  Pulse: 90 82  Resp: (!) 22 (!) 21  Temp: (!) 36 C     Last Pain:  Vitals:   03/13/16 1245  TempSrc: Tympanic  PainSc:                  Caddie Randle

## 2016-03-13 NOTE — Progress Notes (Signed)
[place] at [place] NAME: Jasmine Mason    MR#:  [id number]  DATE OF BIRTH:  [birth year]  SUBJECTIVE:  Pt was feeling better  No vomiting since last nite  REVIEW OF SYSTEMS:   Review of Systems  Constitutional: Negative for chills, fever and weight loss.  HENT: Negative for ear discharge, ear pain and nosebleeds.   Eyes: Negative for blurred vision, pain and discharge.  Respiratory: Negative for sputum production, shortness of breath, wheezing and stridor.   Cardiovascular: Negative for chest pain, palpitations, orthopnea and PND.  Gastrointestinal: Positive for abdominal pain and vomiting. Negative for diarrhea and nausea.  Genitourinary: Negative for frequency and urgency.  Musculoskeletal: Negative for back pain and joint pain.  Neurological: Positive for weakness. Negative for sensory change, speech change and focal weakness.  Psychiatric/Behavioral: Negative for depression and hallucinations. The patient is not nervous/anxious.    Tolerating Diet: CLD Tolerating PT: ambulatory  DRUG ALLERGIES:  No Known Allergies  VITALS:  Blood pressure (!) 138/96, pulse 69, temperature (!) 96.8 F (36 C), temperature source Tympanic, resp. rate 16, height 5\' 7"  (1.702 m), weight 221 lb 12.8 oz (100.6 kg), last menstrual period 02/22/2016, SpO2 98 %.  PHYSICAL EXAMINATION:   Physical Exam  GENERAL:  30 y.o.-year-old patient lying in the bed with no acute distress.  EYES: Pupils equal, round, reactive to light and accommodation. No scleral icterus. Extraocular muscles intact.  HEENT: Head atraumatic, normocephalic. Oropharynx and nasopharynx clear.  NECK:  Supple, no jugular venous distention. No thyroid enlargement, no tenderness.  LUNGS: Normal breath sounds bilaterally, no wheezing, rales, rhonchi. No use of accessory muscles of respiration.  CARDIOVASCULAR: S1, S2 normal. No murmurs, rubs, or gallops.  ABDOMEN: Soft, nontender,  nondistended. Bowel sounds present. No organomegaly or mass.  EXTREMITIES: No cyanosis, clubbing or edema b/l.    NEUROLOGIC: Cranial nerves II through XII are intact. No focal Motor or sensory deficits b/l.   PSYCHIATRIC:  patient is alert and oriented x 3.  SKIN: No obvious rash, lesion, or ulcer.   LABORATORY PANEL:  CBC  Recent Labs Lab 03/11/16 0610  WBC 21.4*  HGB 15.0  HCT 45.1  PLT 323    Chemistries   Recent Labs Lab 03/10/16 1916 03/11/16 0610  NA 139 139  K 3.6 3.5  CL 105 107  CO2 24 23  GLUCOSE 143* 116*  BUN 10 9  CREATININE 0.58 0.71  CALCIUM 9.2 8.8*  AST 22  --   ALT 24  --   ALKPHOS 92  --   BILITOT 0.7  --    Cardiac Enzymes No results for input(s): TROPONINI in the last 168 hours. RADIOLOGY:  No results found. ASSESSMENT AND PLAN:   30 y.o.femalewith a history of Chronic abdominal pain, GERD, mesenteric adenitisnow being admitted with: 1. Intractable nausea and vomiting due to gastritis/GERD -pt hashistory of multiple emergency department visits for the same symptoms.  -Continue PPI per GI -she had normal EGD last year -several CT abdomen are negative [place] abd negative -Denies any stressors -EGD today  2. Leukocytosis, chronic-the patient does not have any symptoms that would suggest an active infection such as fevers, chills, abdominal pain or diarrhea.    Case discussed with Care Management/Social Worker. Management plans discussed with the patient, family and they are in agreement.  CODE STATUS: Full  DVT Prophylaxis: Lovenox TOTAL TIME TAKING CARE OF THIS PATIENT: 30 minutes.  >50% time spent on counselling and coordination  of care  Note: This dictation was prepared with Dragon dictation along with smaller phrase technology. Any transcriptional errors that result from this process are unintentional.  Treston Coker M.D on 03/13/2016 at 1:45 PM  Between 7am to 6pm - Pager - [phone]  After 6pm go to [email or web address] -  password [email or web address] [facility]  Office  [phone]  CC: Primary care physician; Jules Schick, NP

## 2016-03-13 NOTE — Anesthesia Preprocedure Evaluation (Signed)
Anesthesia Evaluation  Patient identified by MRN, date of birth, ID band Patient awake    Reviewed: Allergy & Precautions, NPO status , Patient's Chart, lab work & pertinent test results  History of Anesthesia Complications Negative for: history of anesthetic complications  Airway Mallampati: III  TM Distance: >3 FB Neck ROM: Full    Dental no notable dental hx.    Pulmonary neg COPD, Current Smoker,    breath sounds clear to auscultation- rhonchi (-) wheezing      Cardiovascular Exercise Tolerance: Good (-) hypertension(-) CAD and (-) Past MI  Rhythm:Regular Rate:Normal - Systolic murmurs and - Diastolic murmurs    Neuro/Psych negative neurological ROS  negative psych ROS   GI/Hepatic Neg liver ROS, hiatal hernia, GERD  Medicated,  Endo/Other  negative endocrine ROS  Renal/GU negative Renal ROS     Musculoskeletal negative musculoskeletal ROS (+)   Abdominal (+) + obese,   Peds  Hematology negative hematology ROS (+)   Anesthesia Other Findings   Reproductive/Obstetrics                             Anesthesia Physical Anesthesia Plan  ASA: II  Anesthesia Plan: General   Post-op Pain Management:    Induction: Intravenous  Airway Management Planned: Natural Airway  Additional Equipment:   Intra-op Plan:   Post-operative Plan:   Informed Consent: I have reviewed the patients History and Physical, chart, labs and discussed the procedure including the risks, benefits and alternatives for the proposed anesthesia with the patient or authorized representative who has indicated his/her understanding and acceptance.   Dental advisory given  Plan Discussed with: CRNA and Anesthesiologist  Anesthesia Plan Comments:         Anesthesia Quick Evaluation

## 2016-03-13 NOTE — Transfer of Care (Signed)
Immediate Anesthesia Transfer of Care Note  Patient: Jasmine Mason  Procedure(s) Performed: Procedure(s): ESOPHAGOGASTRODUODENOSCOPY (EGD) WITH PROPOFOL (N/A)  Patient Location: PACU and Endoscopy Unit  Anesthesia Type:General  Level of Consciousness: sedated  Airway & Oxygen Therapy: Patient Spontanous Breathing and Patient connected to nasal cannula oxygen  Post-op Assessment: Report given to RN and Post -op Vital signs reviewed and stable  Post vital signs: Reviewed and stable  Last Vitals:  Vitals:   03/13/16 1201 03/13/16 1245  BP: 135/74 122/82  Pulse: 83 90  Resp: 16 (!) 22  Temp: 36.9 C (!) 36 C    Complications: No apparent anesthesia complications

## 2016-03-13 NOTE — Discharge Summary (Addendum)
[facility] at [place] NAME: Kellyn Kutsch    MR#:  [id number]  DATE OF BIRTH:  [birth year]  DATE OF ADMISSION:  03/10/2016              ADMITTING PHYSICIAN: Harvie Bridge, DO  DATE OF DISCHARGE: 03/13/16  PRIMARY CARE PHYSICIAN: Mercie Eon F, NP    ADMISSION DIAGNOSIS:  Intractable vomiting with nausea, vomiting of unspecified type [R11.10]  DISCHARGE DIAGNOSIS:  Nausea vomiting due to acute gastritis/GERD Chronic leucocytosis  SECONDARY DIAGNOSIS:       Past Medical History:  Diagnosis Date  . Back pain   . Back pain   . GERD (gastroesophageal reflux disease)   . Mesenteric adenitis   . Obesity   . Tobacco abuse     HOSPITAL COURSE:   30 y.o.femalewith a history of Chronic abdominal pain, GERD, mesenteric adenitisnow being admitted with: 1. Intractable nausea and vomiting due to gastritis/GERD -pt hashistory of multiple emergency department visits for the same symptoms.  -Continue PPI per GI -she had normal EGD last year -several CT abdomens are negative [place] abd negative -EGD today was normal except small HH  2. Leukocytosis, chronic-the patient does not have any symptoms that would suggest an active infection such as fevers, chills, abdominal pain or diarrhea.   Will resume CLD and then D/c home later today CONSULTS OBTAINED:  Treatment Team:  Lucilla Lame, MD  DRUG ALLERGIES:  No Known Allergies  DISCHARGE MEDICATIONS:      Current Discharge Medication List  Protonix Take 40 mg po daily  zofran 4 mg q 8 an needed   CONTINUE these medications which have NOT CHANGED

## 2016-03-13 NOTE — Anesthesia Procedure Notes (Signed)
Date/Time: 03/13/2016 12:21 PM Performed by: Doreen Salvage Pre-anesthesia Checklist: Patient identified, Emergency Drugs available, Suction available and Patient being monitored Patient Re-evaluated:Patient Re-evaluated prior to inductionOxygen Delivery Method: Nasal cannula Intubation Type: IV induction Dental Injury: Teeth and Oropharynx as per pre-operative assessment  Comments: Nasal cannula with etCO2 monitoring

## 2016-03-13 NOTE — Plan of Care (Addendum)
Problem: Nutrition: Goal: Adequate nutrition will be maintained Outcome: Progressing No emesis since 8 PM 03/12/16. Tolerating a clear liquid diet. Last fluid at 2300.  NPO since 2300.  Dorna Bloom RN

## 2016-03-13 NOTE — Op Note (Signed)
[facility] Gastroenterology Patient Name: Jasmine Mason Procedure Date: 03/13/2016 12:21 PM MRN: [id number] Account #: [id number] Date of Birth: Oct 29, 1985 Admit Type: Inpatient Age: 30 Room: [facility] ENDO ROOM 1 Gender: Female Note Status: Finalized Procedure:            Upper GI endoscopy Indications:          Nausea with vomiting Providers:            Lucilla Lame MD, MD Medicines:            Propofol per Anesthesia Complications:        No immediate complications. Procedure:            Pre-Anesthesia Assessment:                       - Prior to the procedure, a History and Physical was                        performed, and patient medications and allergies were                        reviewed. The patient's tolerance of previous                        anesthesia was also reviewed. The risks and benefits of                        the procedure and the sedation options and risks were                        discussed with the patient. All questions were                        answered, and informed consent was obtained. Prior                        Anticoagulants: The patient has taken no previous                        anticoagulant or antiplatelet agents. ASA Grade                        Assessment: II - A patient with mild systemic disease.                        After reviewing the risks and benefits, the patient was                        deemed in satisfactory condition to undergo the                        procedure.                       After obtaining informed consent, the endoscope was                        passed under direct vision. Throughout the procedure,                        the patient's blood pressure,  pulse, and oxygen                        saturations were monitored continuously. The Endoscope                        was introduced through the mouth, and advanced to the                        second part of duodenum. The upper GI endoscopy was                         accomplished without difficulty. The patient tolerated                        the procedure well. Findings:      A small hiatal hernia was present.      The stomach was normal.      The examined duodenum was normal. Impression:           - Small hiatal hernia.                       - Normal stomach.                       - Normal examined duodenum.                       - No specimens collected. Recommendation:       - Resume regular diet.                       - Continue present medications. Procedure Code(s):    --- Professional ---                       [phone], Esophagogastroduodenoscopy, flexible, transoral;                        diagnostic, including collection of specimen(s) by                        brushing or washing, when performed (separate procedure) Diagnosis Code(s):    --- Professional ---                       R11.2, Nausea with vomiting, unspecified                       K44.9, Diaphragmatic hernia without obstruction or                        gangrene CPT copyright 2016 [employer]. All rights reserved. The codes documented in this report are preliminary and upon coder review may  be revised to meet current compliance requirements. Lucilla Lame MD, MD 03/13/2016 12:34:02 PM This report has been signed electronically. Number of Addenda: 0 Note Initiated On: 03/13/2016 12:21 PM      [facility]

## 2016-03-14 ENCOUNTER — Encounter: Payer: Self-pay | Admitting: Gastroenterology

## 2016-03-28 ENCOUNTER — Ambulatory Visit: Payer: Self-pay | Admitting: Pediatrics

## 2016-03-30 ENCOUNTER — Ambulatory Visit: Payer: Self-pay | Admitting: Pediatrics

## 2016-04-02 ENCOUNTER — Encounter: Payer: Self-pay | Admitting: Pediatrics

## 2016-04-27 ENCOUNTER — Emergency Department
Admission: EM | Admit: 2016-04-27 | Discharge: 2016-04-27 | Disposition: A | Discharge status: Home / Self Care | Payer: Self-pay | Attending: Emergency Medicine | Admitting: Emergency Medicine

## 2016-04-27 ENCOUNTER — Encounter: Payer: Self-pay | Admitting: *Deleted

## 2016-04-27 DIAGNOSIS — F17210 Nicotine dependence, cigarettes, uncomplicated: Secondary | ICD-10-CM | POA: Insufficient documentation

## 2016-04-27 DIAGNOSIS — H65112 Acute and subacute allergic otitis media (mucoid) (sanguinous) (serous), left ear: Secondary | ICD-10-CM | POA: Insufficient documentation

## 2016-04-27 DIAGNOSIS — Z79899 Other long term (current) drug therapy: Secondary | ICD-10-CM | POA: Insufficient documentation

## 2016-04-27 MED ORDER — AMOXICILLIN 500 MG PO CAPS
500.0000 mg | ORAL_CAPSULE | Freq: Once | ORAL | Status: AC
  Administered 2016-04-27: 500 mg via ORAL
  Filled 2016-04-27: qty 1

## 2016-04-27 MED ORDER — TRAMADOL HCL 50 MG PO TABS: 50.0000 mg | ORAL_TABLET | Freq: Four times a day (QID) | ORAL | 0 refills | Status: DC | PRN

## 2016-04-27 MED ORDER — AMOXICILLIN 500 MG PO CAPS: 500.0000 mg | ORAL_CAPSULE | Freq: Three times a day (TID) | ORAL | 0 refills | Status: DC

## 2016-04-27 MED ORDER — IBUPROFEN 600 MG PO TABS
600.0000 mg | ORAL_TABLET | Freq: Once | ORAL | Status: AC
  Administered 2016-04-27: 600 mg via ORAL
  Filled 2016-04-27: qty 1

## 2016-04-27 MED ORDER — OXYCODONE-ACETAMINOPHEN 5-325 MG PO TABS
1.0000 | ORAL_TABLET | Freq: Once | ORAL | Status: AC
  Administered 2016-04-27: 1 via ORAL
  Filled 2016-04-27: qty 1

## 2016-04-27 MED ORDER — IBUPROFEN 600 MG PO TABS: 600.0000 mg | ORAL_TABLET | Freq: Three times a day (TID) | ORAL | 0 refills | Status: DC | PRN

## 2016-04-27 NOTE — ED Provider Notes (Signed)
[facility] Emergency Department Provider Note   ____________________________________________   First MD Initiated Contact with Patient 04/27/16 2240     (approximate)  I have reviewed the triage vital signs and the nursing notes.   HISTORY  Chief Complaint Otalgia    HPI Jasmine Mason is a 30 y.o. female patient complaining about 2 weeks of left ear pain. Patient stated pain increases to date. Patient has no relief with over-the-counter Tylenol. Last dose of Tylenol to 1400 hrs. today. Patient complain also swollen behind the ear. Patient denies any discharge. Patient described her pain as "tenderness. On the left ear. Patient states she's been taking in her ear today. No other palliative measures for this complaint.Patient is rating the pain as a 7/10.   Past Medical History:  Diagnosis Date  . Back pain   . Back pain   . GERD (gastroesophageal reflux disease)   . Mesenteric adenitis   . Obesity   . Tobacco abuse     Patient Active Problem List   Diagnosis Date Noted  . Nausea with vomiting   . Hiatal hernia   . Uncontrollable vomiting 03/10/2016  . Abdominal pain, epigastric   . GERD (gastroesophageal reflux disease) 01/24/2015  . Hypokalemia 01/24/2015  . Abdominal pain 01/24/2015  . Leukocytosis 01/24/2015  . Chest pain 01/24/2015  . Cough 01/24/2015  . Pain of left calf 01/24/2015  . Elevated troponin 01/24/2015  . Obesity   . Mesenteric adenitis   . Tobacco abuse     Past Surgical History:  Procedure Laterality Date  . CARPAL TUNNEL RELEASE Left ~ 2013  . ESOPHAGOGASTRODUODENOSCOPY N/A 01/25/2015   Procedure: ESOPHAGOGASTRODUODENOSCOPY (EGD);  Surgeon: Irene Shipper, MD;  Location: [facility] ENDOSCOPY;  Service: Endoscopy;  Laterality: N/A;  . ESOPHAGOGASTRODUODENOSCOPY (EGD) WITH PROPOFOL N/A 03/13/2016   Procedure: ESOPHAGOGASTRODUODENOSCOPY (EGD) WITH PROPOFOL;  Surgeon: Lucilla Lame, MD;  Location: [facility] ENDOSCOPY;  Service:  Endoscopy;  Laterality: N/A;  . FRACTURE SURGERY    . ORIF ANKLE FRACTURE Left 1990's    Prior to Admission medications   Medication Sig Start Date End Date Taking? Authorizing Provider  amoxicillin (AMOXIL) 500 MG capsule Take 1 capsule (500 mg total) by mouth 3 (three) times daily. 04/27/16   Sable Feil, PA-C  ibuprofen (ADVIL,MOTRIN) 600 MG tablet Take 1 tablet (600 mg total) by mouth every 8 (eight) hours as needed. 04/27/16   Sable Feil, PA-C  ondansetron (ZOFRAN) 4 MG tablet Take 1 tablet (4 mg total) by mouth every 8 (eight) hours as needed for nausea or vomiting. 03/13/16   Fritzi Mandes, MD  pantoprazole (PROTONIX) 40 MG tablet Take 1 tablet (40 mg total) by mouth daily. 03/12/16   Fritzi Mandes, MD  traMADol (ULTRAM) 50 MG tablet Take 1 tablet (50 mg total) by mouth every 6 (six) hours as needed. 04/27/16 04/27/17  Sable Feil, PA-C    Allergies Review of patient's allergies indicates no known allergies.  Family History  Problem Relation Age of Onset  . Hypertension Mother   . Hypertension Father   . Diabetes Father   . Heart disease Father   . Hypertension Sister     Social History Social History  Substance Use Topics  . Smoking status: Current Every Day Smoker    Packs/day: 0.25    Years: 10.00    Types: Cigarettes  . Smokeless tobacco: Never Used  . Alcohol use No    Review of Systems Constitutional: No fever/chills Eyes: No visual changes.  ENT: No sore throat. Cardiovascular: Denies chest pain. Respiratory: Denies shortness of breath. Gastrointestinal: No abdominal pain.  No nausea, no vomiting.  No diarrhea.  No constipation. Genitourinary: Negative for dysuria. Musculoskeletal: Negative for back pain. Skin: Negative for rash. Neurological: Negative for headaches, focal weakness or numbness.    ____________________________________________   PHYSICAL EXAM:  VITAL SIGNS: ED Triage Vitals   Enc Vitals Group     BP (!) 150/89     Pulse Rate 86      Resp 16     Temp 98.7 F (37.1 C)     Temp Source Oral     SpO2 96 %     Weight 212 lb (96.2 kg)     Height 5\' 7"  (1.702 m)     Head Circumference      Peak Flow      Pain Score 7     Pain Loc      Pain Edu?      Excl. in [place]?     Constitutional: Alert and oriented. Well appearing and in no acute distress. Eyes: Conjunctivae are normal. PERRL. EOMI. Head: Atraumatic. Nose: No congestion/rhinnorhea. ENT: Edematous erythematous left TM. Mouth/Throat: Mucous membranes are moist.  Oropharynx non-erythematous. Neck: No stridor.  No cervical spine tenderness to palpation. Hematological/Lymphatic/Immunilogical: Left auricle lymphadenopathy. Cardiovascular: Normal rate, regular rhythm. Grossly normal heart sounds.  Good peripheral circulation. Elevated blood pressure Respiratory: Normal respiratory effort.  No retractions. Lungs CTAB. Gastrointestinal: Soft and nontender. No distention. No abdominal bruits. No CVA tenderness. Musculoskeletal: No lower extremity tenderness nor edema.  No joint effusions. Neurologic:  Normal speech and language. No gross focal neurologic deficits are appreciated. No gait instability. Skin:  Skin is warm, dry and intact. No rash noted. Psychiatric: Mood and affect are normal. Speech and behavior are normal.  ____________________________________________   LABS (all labs ordered are listed, but only abnormal results are displayed)  Labs Reviewed - No data to display ____________________________________________  EKG   ____________________________________________  RADIOLOGY   ____________________________________________   PROCEDURES  Procedure(s) performed: None  Procedures  Critical Care performed: No  ____________________________________________   INITIAL IMPRESSION / ASSESSMENT AND PLAN / ED COURSE  Pertinent labs & imaging results that were available during my care of the patient were reviewed by me and considered in my medical  decision making (see chart for details).  Left otitis media. Patient given discharge Instructions. Patient given a prescription for amoxicillin, tramadol, and ibuprofen. Patient advised to follow-up with "clinic if no improvement 3-5 days.  Clinical Course     ____________________________________________   FINAL CLINICAL IMPRESSION(S) / ED DIAGNOSES  Final diagnoses:  Subacute allergic otitis media of left ear, recurrence not specified      NEW MEDICATIONS STARTED DURING THIS VISIT:  New Prescriptions   AMOXICILLIN (AMOXIL) 500 MG CAPSULE    Take 1 capsule (500 mg total) by mouth 3 (three) times daily.   IBUPROFEN (ADVIL,MOTRIN) 600 MG TABLET    Take 1 tablet (600 mg total) by mouth every 8 (eight) hours as needed.   TRAMADOL (ULTRAM) 50 MG TABLET    Take 1 tablet (50 mg total) by mouth every 6 (six) hours as needed.     Note:  This document was prepared using Dragon voice recognition software and may include unintentional dictation errors.    Sable Feil, PA-C 04/27/16 [place], MD 04/27/16 2352

## 2016-04-27 NOTE — ED Triage Notes (Signed)
Pt c/o L ear pain, worse today. Pt denies relief from OTC meds for pain. Last taken tylenol at 1400 today. Pt c/o L ear swelling. Pt denies drainage. Pt c/o tenderness behind L ear. Pt states she has been "digging" at L ear today.

## 2016-05-08 ENCOUNTER — Encounter: Payer: Self-pay | Admitting: Emergency Medicine

## 2016-05-08 DIAGNOSIS — F17210 Nicotine dependence, cigarettes, uncomplicated: Secondary | ICD-10-CM | POA: Insufficient documentation

## 2016-05-08 DIAGNOSIS — R112 Nausea with vomiting, unspecified: Secondary | ICD-10-CM | POA: Insufficient documentation

## 2016-05-08 DIAGNOSIS — Z791 Long term (current) use of non-steroidal anti-inflammatories (NSAID): Secondary | ICD-10-CM | POA: Insufficient documentation

## 2016-05-08 DIAGNOSIS — D72829 Elevated white blood cell count, unspecified: Secondary | ICD-10-CM | POA: Insufficient documentation

## 2016-05-08 LAB — URINALYSIS COMPLETE WITH MICROSCOPIC (ARMC ONLY)
Bilirubin Urine: NEGATIVE
Glucose, UA: NEGATIVE mg/dL
Leukocytes, UA: NEGATIVE
Nitrite: NEGATIVE
PH: 5 (ref 5.0–8.0)
PROTEIN: 30 mg/dL — AB
Specific Gravity, Urine: 1.026 (ref 1.005–1.030)

## 2016-05-08 LAB — CBC
HEMATOCRIT: 48.7 % — AB (ref 35.0–47.0)
HEMOGLOBIN: 15.9 g/dL (ref 12.0–16.0)
MCH: 29.3 pg (ref 26.0–34.0)
MCHC: 32.7 g/dL (ref 32.0–36.0)
MCV: 89.4 fL (ref 80.0–100.0)
Platelets: 331 10*3/uL (ref 150–440)
RBC: 5.45 MIL/uL — AB (ref 3.80–5.20)
RDW: 13.7 % (ref 11.5–14.5)
WBC: 16.9 10*3/uL — ABNORMAL HIGH (ref 3.6–11.0)

## 2016-05-08 LAB — COMPREHENSIVE METABOLIC PANEL
ALBUMIN: 5 g/dL (ref 3.5–5.0)
ALK PHOS: 90 U/L (ref 38–126)
ALT: 25 U/L (ref 14–54)
ANION GAP: 9 (ref 5–15)
AST: 27 U/L (ref 15–41)
BUN: 10 mg/dL (ref 6–20)
CHLORIDE: 106 mmol/L (ref 101–111)
CO2: 24 mmol/L (ref 22–32)
Calcium: 9.2 mg/dL (ref 8.9–10.3)
Creatinine, Ser: 0.65 mg/dL (ref 0.44–1.00)
GFR calc non Af Amer: >60 mL/min (ref >60)
GLUCOSE: 132 mg/dL — AB (ref 65–99)
Potassium: 3.4 mmol/L — ABNORMAL LOW (ref 3.5–5.1)
SODIUM: 139 mmol/L (ref 135–145)
Total Bilirubin: 0.6 mg/dL (ref 0.3–1.2)
Total Protein: 8.5 g/dL — ABNORMAL HIGH (ref 6.5–8.1)

## 2016-05-08 LAB — LIPASE, BLOOD: LIPASE: 21 U/L (ref 11–51)

## 2016-05-08 LAB — POCT PREGNANCY, URINE: PREG TEST UR: NEGATIVE

## 2016-05-08 MED ORDER — ONDANSETRON 4 MG PO TBDP
4.0000 mg | ORAL_TABLET | Freq: Once | ORAL | Status: AC | PRN
  Administered 2016-05-08: 4 mg via ORAL
  Filled 2016-05-08: qty 1

## 2016-05-08 NOTE — ED Triage Notes (Signed)
Pt ambulatory to triage with steady gait, no distress noted. Pt c/o N/V, upper abdominal pain x1 day. Denies Diarrhea.

## 2016-05-09 ENCOUNTER — Emergency Department: Payer: Self-pay

## 2016-05-09 ENCOUNTER — Emergency Department
Admission: EM | Admit: 2016-05-09 | Discharge: 2016-05-09 | Disposition: A | Discharge status: Home / Self Care | Payer: Self-pay | Attending: Emergency Medicine | Admitting: Emergency Medicine

## 2016-05-09 DIAGNOSIS — R1011 Right upper quadrant pain: Secondary | ICD-10-CM

## 2016-05-09 DIAGNOSIS — R1110 Vomiting, unspecified: Secondary | ICD-10-CM

## 2016-05-09 DIAGNOSIS — D72829 Elevated white blood cell count, unspecified: Secondary | ICD-10-CM

## 2016-05-09 MED ORDER — NAPROXEN 500 MG PO TABS: 500.0000 mg | ORAL_TABLET | Freq: Two times a day (BID) | ORAL | 0 refills | Status: DC

## 2016-05-09 MED ORDER — ALUMINUM-MAGNESIUM-SIMETHICONE 200-200-20 MG/5ML PO SUSP: 30.0000 mL | Freq: Three times a day (TID) | ORAL | 0 refills | Status: DC

## 2016-05-09 MED ORDER — ONDANSETRON HCL 4 MG/2ML IJ SOLN
4.0000 mg | Freq: Once | INTRAMUSCULAR | Status: AC
  Administered 2016-05-09: 4 mg via INTRAVENOUS

## 2016-05-09 MED ORDER — FAMOTIDINE 20 MG PO TABS: 20.0000 mg | ORAL_TABLET | Freq: Two times a day (BID) | ORAL | 0 refills | Status: DC

## 2016-05-09 MED ORDER — SODIUM CHLORIDE 0.9 % IV BOLUS (SEPSIS)
1000.0000 mL | Freq: Once | INTRAVENOUS | Status: AC
  Administered 2016-05-09: 1000 mL via INTRAVENOUS

## 2016-05-09 MED ORDER — ONDANSETRON HCL 4 MG/2ML IJ SOLN
INTRAMUSCULAR | Status: AC
  Administered 2016-05-09: 4 mg via INTRAVENOUS
  Filled 2016-05-09: qty 2

## 2016-05-09 MED ORDER — IOPAMIDOL (ISOVUE-300) INJECTION 61%
100.0000 mL | Freq: Once | INTRAVENOUS | Status: AC | PRN
  Administered 2016-05-09: 100 mL via INTRAVENOUS

## 2016-05-09 MED ORDER — MORPHINE SULFATE (PF) 2 MG/ML IV SOLN
4.0000 mg | Freq: Once | INTRAVENOUS | Status: AC
  Administered 2016-05-09: 4 mg via INTRAVENOUS

## 2016-05-09 MED ORDER — MORPHINE SULFATE (PF) 2 MG/ML IV SOLN
INTRAVENOUS | Status: AC
  Filled 2016-05-09: qty 2

## 2016-05-09 MED ORDER — METOCLOPRAMIDE HCL 10 MG PO TABS: 10.0000 mg | ORAL_TABLET | Freq: Four times a day (QID) | ORAL | 0 refills | Status: DC | PRN

## 2016-05-09 MED ORDER — ONDANSETRON HCL 4 MG/2ML IJ SOLN
INTRAMUSCULAR | Status: AC
  Filled 2016-05-09: qty 2

## 2016-05-09 MED ORDER — ONDANSETRON HCL 4 MG/2ML IJ SOLN
INTRAMUSCULAR | Status: AC
  Administered 2016-05-09: 4 mg
  Filled 2016-05-09: qty 2

## 2016-05-09 MED ORDER — IOPAMIDOL (ISOVUE-300) INJECTION 61%
30.0000 mL | Freq: Once | INTRAVENOUS | Status: AC | PRN
  Administered 2016-05-09: 30 mL via ORAL

## 2016-05-09 NOTE — ED Provider Notes (Signed)
[facility] Emergency Department Provider Note    First MD Initiated Contact with Patient 05/09/16 [phone]     (approximate)  I have reviewed the triage vital signs and the nursing notes.   HISTORY  Chief Complaint Abdominal Pain and Emesis    HPI Jasmine Mason is a 30 y.o. female presents with right upper quadrant/epigastric abdominal pain accompanied with vomiting times one day. Patient denies any fever no diarrhea. Patient denies any dysuria no urinary frequency or urgency.   Past Medical History:  Diagnosis Date  . Back pain   . Back pain   . GERD (gastroesophageal reflux disease)   . Mesenteric adenitis   . Obesity   . Tobacco abuse     Patient Active Problem List   Diagnosis Date Noted  . Nausea with vomiting   . Hiatal hernia   . Uncontrollable vomiting 03/10/2016  . Abdominal pain, epigastric   . GERD (gastroesophageal reflux disease) 01/24/2015  . Hypokalemia 01/24/2015  . Abdominal pain 01/24/2015  . Leukocytosis 01/24/2015  . Chest pain 01/24/2015  . Cough 01/24/2015  . Pain of left calf 01/24/2015  . Elevated troponin 01/24/2015  . Obesity   . Mesenteric adenitis   . Tobacco abuse     Past Surgical History:  Procedure Laterality Date  . CARPAL TUNNEL RELEASE Left ~ 2013  . ESOPHAGOGASTRODUODENOSCOPY N/A 01/25/2015   Procedure: ESOPHAGOGASTRODUODENOSCOPY (EGD);  Surgeon: Irene Shipper, MD;  Location: [facility] ENDOSCOPY;  Service: Endoscopy;  Laterality: N/A;  . ESOPHAGOGASTRODUODENOSCOPY (EGD) WITH PROPOFOL N/A 03/13/2016   Procedure: ESOPHAGOGASTRODUODENOSCOPY (EGD) WITH PROPOFOL;  Surgeon: Lucilla Lame, MD;  Location: [facility] ENDOSCOPY;  Service: Endoscopy;  Laterality: N/A;  . FRACTURE SURGERY    . ORIF ANKLE FRACTURE Left 1990's    Prior to Admission medications   Medication Sig Start Date End Date Taking? Authorizing Provider  amoxicillin (AMOXIL) 500 MG capsule Take 1 capsule (500 mg total) by mouth 3 (three) times daily.  04/27/16  Yes Sable Feil, PA-C  ibuprofen (ADVIL,MOTRIN) 600 MG tablet Take 1 tablet (600 mg total) by mouth every 8 (eight) hours as needed. 04/27/16  Yes Sable Feil, PA-C  ondansetron (ZOFRAN) 4 MG tablet Take 1 tablet (4 mg total) by mouth every 8 (eight) hours as needed for nausea or vomiting. 03/13/16  Yes Fritzi Mandes, MD  traMADol (ULTRAM) 50 MG tablet Take 1 tablet (50 mg total) by mouth every 6 (six) hours as needed. 04/27/16 04/27/17 Yes Sable Feil, PA-C  aluminum-magnesium hydroxide-simethicone (MAALOX) 200-200-20 MG/5ML SUSP Take 30 mLs by mouth 4 (four) times daily -  before meals and at bedtime. 05/09/16   Carrie Mew, MD  famotidine (PEPCID) 20 MG tablet Take 1 tablet (20 mg total) by mouth 2 (two) times daily. 05/09/16   Carrie Mew, MD  metoCLOPramide (REGLAN) 10 MG tablet Take 1 tablet (10 mg total) by mouth every 6 (six) hours as needed. 05/09/16   Carrie Mew, MD  naproxen (NAPROSYN) 500 MG tablet Take 1 tablet (500 mg total) by mouth 2 (two) times daily with a meal. 05/09/16   Carrie Mew, MD  pantoprazole (PROTONIX) 40 MG tablet Take 1 tablet (40 mg total) by mouth daily. Patient not taking: Reported on 05/09/2016 03/12/16   Fritzi Mandes, MD    Allergies No known drug allergies  Family History  Problem Relation Age of Onset  . Hypertension Mother   . Hypertension Father   . Diabetes Father   . Heart disease Father   .  Hypertension Sister     Social History Social History  Substance Use Topics  . Smoking status: Current Every Day Smoker    Packs/day: 0.25    Years: 10.00    Types: Cigarettes  . Smokeless tobacco: Never Used  . Alcohol use No    Review of Systems Constitutional: No fever/chills Eyes: No visual changes. ENT: No sore throat. Cardiovascular: Denies chest pain. Respiratory: Denies shortness of breath. Gastrointestinal: Positive for abdominal pain and vomiting Genitourinary: Negative for dysuria. Musculoskeletal:  Negative for back pain. Skin: Negative for rash. Neurological: Negative for headaches, focal weakness or numbness.  10-point ROS otherwise negative.  ____________________________________________   PHYSICAL EXAM:  VITAL SIGNS: ED Triage Vitals  Enc Vitals Group     BP 05/08/16 2247 (!) 189/100     Pulse Rate 05/08/16 2247 80     Resp 05/08/16 2247 15     Temp 05/08/16 2247 97.9 F (36.6 C)     Temp Source 05/08/16 2247 Oral     SpO2 05/08/16 2247 98 %     Weight 05/08/16 2248 212 lb (96.2 kg)     Height 05/08/16 2248 5\' 7"  (1.702 m)     Head Circumference --      Peak Flow --      Pain Score 05/08/16 2248 8     Pain Loc --      Pain Edu? --      Excl. in [place]? --     Constitutional: Alert and oriented. Well appearing and in no acute distress. Eyes: Conjunctivae are normal. PERRL. EOMI. Head: Atraumatic. Mouth/Throat: Mucous membranes are moist.  Oropharynx non-erythematous. Neck: No stridor.  No meningeal signs.   Cardiovascular: Normal rate, regular rhythm. Good peripheral circulation. Grossly normal heart sounds. Respiratory: Normal respiratory effort.  No retractions. Lungs CTAB. Gastrointestinal: Diffuse tenderness to palpation. No distention. :  Musculoskeletal: No lower extremity tenderness nor edema. No gross deformities of extremities. Neurologic:  Normal speech and language. No gross focal neurologic deficits are appreciated.  Skin:  Skin is warm, dry and intact. No rash noted. Psychiatric: Mood and affect are normal. Speech and behavior are normal.  ____________________________________________   LABS (all labs ordered are listed, but only abnormal results are displayed)  Labs Reviewed  COMPREHENSIVE METABOLIC PANEL - Abnormal; Notable for the following:       Result Value   Potassium 3.4 (*)    Glucose, Bld 132 (*)    Total Protein 8.5 (*)    All other components within normal limits  CBC - Abnormal; Notable for the following:    WBC 16.9 (*)    RBC  5.45 (*)    HCT 48.7 (*)    All other components within normal limits  URINALYSIS COMPLETEWITH MICROSCOPIC ([facility] ONLY) - Abnormal; Notable for the following:    Color, Urine YELLOW (*)    APPearance CLEAR (*)    Ketones, ur TRACE (*)    Hgb urine dipstick 2+ (*)    Protein, ur 30 (*)    Bacteria, UA RARE (*)    Squamous Epithelial / LPF 6-30 (*)    All other components within normal limits  LIPASE, BLOOD  POC URINE PREG, ED  POCT PREGNANCY, URINE     Procedures    INITIAL IMPRESSION / ASSESSMENT AND PLAN / ED COURSE  Pertinent labs & imaging results that were available during my care of the patient were reviewed by me and considered in my medical decision making (see chart for details).  Review of patient's chart revealed persistent leukocytosis. Patient's care transferred   Clinical Course    ____________________________________________  FINAL CLINICAL IMPRESSION(S) / ED DIAGNOSES  Final diagnoses:  Vomiting  RUQ pain  Leukocytosis, unspecified type     MEDICATIONS GIVEN DURING THIS VISIT:  Medications  ondansetron (ZOFRAN-ODT) disintegrating tablet 4 mg (4 mg Oral Given 05/08/16 2300)  morphine 2 MG/ML injection 4 mg (4 mg Intravenous Given 05/09/16 0339)  ondansetron (ZOFRAN) injection 4 mg (4 mg Intravenous Given 05/09/16 0339)  sodium chloride 0.9 % bolus 1,000 mL (0 mLs Intravenous Stopped 05/09/16 0641)  iopamidol (ISOVUE-300) 61 % injection 30 mL (30 mLs Oral Contrast Given 05/09/16 0346)  ondansetron (ZOFRAN) injection 4 mg (4 mg Intravenous Given 05/09/16 0528)  iopamidol (ISOVUE-300) 61 % injection 100 mL (100 mLs Intravenous Contrast Given 05/09/16 0605)  ondansetron (ZOFRAN) 4 MG/2ML injection (4 mg  Given 05/09/16 0658)     NEW OUTPATIENT MEDICATIONS STARTED DURING THIS VISIT:  Discharge Medication List as of 05/09/2016  8:55 AM    START taking these medications   Details  metoCLOPramide (REGLAN) 10 MG tablet Take 1 tablet (10 mg total)  by mouth every 6 (six) hours as needed., Starting Wed 05/09/2016, Print    naproxen (NAPROSYN) 500 MG tablet Take 1 tablet (500 mg total) by mouth 2 (two) times daily with a meal., Starting Wed 05/09/2016, Print        Discharge Medication List as of 05/09/2016  8:55 AM      Discharge Medication List as of 05/09/2016  8:55 AM       Note:  This document was prepared using Dragon voice recognition software and may include unintentional dictation errors.    Gregor Hams, MD 05/10/16 [phone]

## 2016-05-09 NOTE — ED Notes (Signed)
Pt. States "I just keep falling asleep" when asked progress of drinking contrast. Pt. Encouraged it is important for her to drink it due to her sx and the necessity in proper testing to make an appropriate dx.

## 2016-05-09 NOTE — ED Notes (Signed)
Pt. Reports taking "tramadol for my ear infection" that has not helped with presenting sx. Today.

## 2016-05-09 NOTE — ED Provider Notes (Signed)
Patient calm and comfortable. Vital signs stable. Abdomen exam benign. CT negative. Patient requests water to drink. 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 No evidence of significant GI bleed at this time.     Carrie Mew, MD 05/09/16 [phone]

## 2016-05-09 NOTE — ED Notes (Signed)
Pt verbalized understanding of discharge instructions. NAD at this time. Pt not happy with her care and stated she had to wait 5 hrs and she "should get a second opinion"

## 2016-05-09 NOTE — ED Notes (Signed)
Pt. Completed 1 bottl of contrast, attempting second bottle after zofran

## 2016-05-09 NOTE — ED Notes (Signed)
Pt. Sleeping. States "can't drink it, I'm sleeping", referring to the contrast.

## 2016-06-07 ENCOUNTER — Emergency Department: Payer: Self-pay

## 2016-06-07 ENCOUNTER — Encounter: Payer: Self-pay | Admitting: *Deleted

## 2016-06-07 ENCOUNTER — Emergency Department
Admission: EM | Admit: 2016-06-07 | Discharge: 2016-06-07 | Disposition: A | Discharge status: Home / Self Care | Payer: Self-pay | Attending: Emergency Medicine | Admitting: Emergency Medicine

## 2016-06-07 DIAGNOSIS — R1031 Right lower quadrant pain: Secondary | ICD-10-CM | POA: Insufficient documentation

## 2016-06-07 DIAGNOSIS — F17210 Nicotine dependence, cigarettes, uncomplicated: Secondary | ICD-10-CM | POA: Insufficient documentation

## 2016-06-07 DIAGNOSIS — Z8719 Personal history of other diseases of the digestive system: Secondary | ICD-10-CM | POA: Insufficient documentation

## 2016-06-07 DIAGNOSIS — Z79899 Other long term (current) drug therapy: Secondary | ICD-10-CM | POA: Insufficient documentation

## 2016-06-07 DIAGNOSIS — Z791 Long term (current) use of non-steroidal anti-inflammatories (NSAID): Secondary | ICD-10-CM | POA: Insufficient documentation

## 2016-06-07 LAB — COMPREHENSIVE METABOLIC PANEL
ALT: 19 U/L (ref 14–54)
ANION GAP: 8 (ref 5–15)
AST: 27 U/L (ref 15–41)
Albumin: 4.3 g/dL (ref 3.5–5.0)
Alkaline Phosphatase: 86 U/L (ref 38–126)
BUN: 12 mg/dL (ref 6–20)
CHLORIDE: 106 mmol/L (ref 101–111)
CO2: 25 mmol/L (ref 22–32)
CREATININE: 0.77 mg/dL (ref 0.44–1.00)
Calcium: 8.9 mg/dL (ref 8.9–10.3)
Glucose, Bld: 125 mg/dL — ABNORMAL HIGH (ref 65–99)
Potassium: 3.3 mmol/L — ABNORMAL LOW (ref 3.5–5.1)
SODIUM: 139 mmol/L (ref 135–145)
Total Bilirubin: 0.6 mg/dL (ref 0.3–1.2)
Total Protein: 7.8 g/dL (ref 6.5–8.1)

## 2016-06-07 LAB — URINALYSIS COMPLETE WITH MICROSCOPIC (ARMC ONLY)
BILIRUBIN URINE: NEGATIVE
Glucose, UA: NEGATIVE mg/dL
KETONES UR: NEGATIVE mg/dL
LEUKOCYTES UA: NEGATIVE
Nitrite: NEGATIVE
PH: 5 (ref 5.0–8.0)
PROTEIN: 30 mg/dL — AB
Specific Gravity, Urine: 1.032 — ABNORMAL HIGH (ref 1.005–1.030)

## 2016-06-07 LAB — WET PREP, GENITAL
Clue Cells Wet Prep HPF POC: NONE SEEN
SPERM: NONE SEEN
Trich, Wet Prep: NONE SEEN
WBC, Wet Prep HPF POC: NONE SEEN
Yeast Wet Prep HPF POC: NONE SEEN

## 2016-06-07 LAB — CBC
HCT: 40.9 % (ref 35.0–47.0)
HEMOGLOBIN: 13.9 g/dL (ref 12.0–16.0)
MCH: 29.5 pg (ref 26.0–34.0)
MCHC: 33.9 g/dL (ref 32.0–36.0)
MCV: 87 fL (ref 80.0–100.0)
PLATELETS: 318 10*3/uL (ref 150–440)
RBC: 4.7 MIL/uL (ref 3.80–5.20)
RDW: 13.6 % (ref 11.5–14.5)
WBC: 14 10*3/uL — AB (ref 3.6–11.0)

## 2016-06-07 LAB — LIPASE, BLOOD: LIPASE: 34 U/L (ref 11–51)

## 2016-06-07 LAB — CHLAMYDIA/NGC RT PCR (ARMC ONLY)
CHLAMYDIA TR: NOT DETECTED
N GONORRHOEAE: NOT DETECTED

## 2016-06-07 LAB — POCT PREGNANCY, URINE: Preg Test, Ur: NEGATIVE

## 2016-06-07 MED ORDER — PANTOPRAZOLE SODIUM 40 MG PO TBEC: 40.0000 mg | DELAYED_RELEASE_TABLET | Freq: Every day | ORAL | 0 refills | Status: DC

## 2016-06-07 MED ORDER — MORPHINE SULFATE (PF) 4 MG/ML IV SOLN
2.0000 mg | Freq: Once | INTRAVENOUS | Status: DC
Start: 2016-06-07 — End: 2016-06-07

## 2016-06-07 MED ORDER — ONDANSETRON HCL 4 MG/2ML IJ SOLN
INTRAMUSCULAR | Status: AC
  Administered 2016-06-07: 4 mg via INTRAVENOUS
  Filled 2016-06-07: qty 2

## 2016-06-07 MED ORDER — ONDANSETRON HCL 4 MG/2ML IJ SOLN
4.0000 mg | Freq: Once | INTRAMUSCULAR | Status: AC
  Administered 2016-06-07: 4 mg via INTRAVENOUS

## 2016-06-07 MED ORDER — IOPAMIDOL (ISOVUE-300) INJECTION 61%
30.0000 mL | Freq: Once | INTRAVENOUS | Status: AC | PRN
  Administered 2016-06-07: 30 mL via ORAL

## 2016-06-07 MED ORDER — SODIUM CHLORIDE 0.9 % IV BOLUS (SEPSIS)
1000.0000 mL | Freq: Once | INTRAVENOUS | Status: AC
  Administered 2016-06-07: 1000 mL via INTRAVENOUS

## 2016-06-07 MED ORDER — MORPHINE SULFATE (PF) 2 MG/ML IV SOLN
INTRAVENOUS | Status: AC
  Administered 2016-06-07: 2 mg via INTRAVENOUS
  Filled 2016-06-07: qty 1

## 2016-06-07 MED ORDER — IOPAMIDOL (ISOVUE-300) INJECTION 61%
100.0000 mL | Freq: Once | INTRAVENOUS | Status: AC | PRN
  Administered 2016-06-07: 100 mL via INTRAVENOUS

## 2016-06-07 NOTE — ED Notes (Signed)
2 unsuccessful PIV attempts by this RN (left AC and left hand). Charge RN (Raquel) made aware and in to attempt PIV access at this time.

## 2016-06-07 NOTE — ED Notes (Signed)
Attempt iv x 2 no success. Butch RN made aware.

## 2016-06-07 NOTE — ED Provider Notes (Addendum)
[facility] Emergency Department Provider Note  ____________________________________________   First MD Initiated Contact with Patient 06/07/16 [phone]     (approximate)  I have reviewed the triage vital signs and the nursing notes.   HISTORY  Chief Complaint Abdominal Pain   HPI Grabriela Scheller is a 30 y.o. female with a history of gastroesophageal reflux disease on a PPI was presenting to the emergency department with 2 days of right lower quadrant abdominal pain which she describes as 9 out of 10 and sharp. Said that she also noticed some pink spotting vaginally today. She says that she usually has her period at the end of the month and does not bleeding between periods. Denies any nausea or vomiting. Denies any diarrhea. Denies any radiation of the pain.   Past Medical History:  Diagnosis Date  . Back pain   . Back pain   . GERD (gastroesophageal reflux disease)   . Mesenteric adenitis   . Obesity   . Tobacco abuse     Patient Active Problem List   Diagnosis Date Noted  . Nausea with vomiting   . Hiatal hernia   . Uncontrollable vomiting 03/10/2016  . Abdominal pain, epigastric   . GERD (gastroesophageal reflux disease) 01/24/2015  . Hypokalemia 01/24/2015  . Abdominal pain 01/24/2015  . Leukocytosis 01/24/2015  . Chest pain 01/24/2015  . Cough 01/24/2015  . Pain of left calf 01/24/2015  . Elevated troponin 01/24/2015  . Obesity   . Mesenteric adenitis   . Tobacco abuse     Past Surgical History:  Procedure Laterality Date  . CARPAL TUNNEL RELEASE Left ~ 2013  . ESOPHAGOGASTRODUODENOSCOPY N/A 01/25/2015   Procedure: ESOPHAGOGASTRODUODENOSCOPY (EGD);  Surgeon: Irene Shipper, MD;  Location: [facility] ENDOSCOPY;  Service: Endoscopy;  Laterality: N/A;  . ESOPHAGOGASTRODUODENOSCOPY (EGD) WITH PROPOFOL N/A 03/13/2016   Procedure: ESOPHAGOGASTRODUODENOSCOPY (EGD) WITH PROPOFOL;  Surgeon: Lucilla Lame, MD;  Location: [facility] ENDOSCOPY;  Service: Endoscopy;   Laterality: N/A;  . FRACTURE SURGERY    . ORIF ANKLE FRACTURE Left 1990's    Prior to Admission medications   Medication Sig Start Date End Date Taking? Authorizing Provider  aluminum-magnesium hydroxide-simethicone (MAALOX) I037812 MG/5ML SUSP Take 30 mLs by mouth 4 (four) times daily -  before meals and at bedtime. 05/09/16   Carrie Mew, MD  amoxicillin (AMOXIL) 500 MG capsule Take 1 capsule (500 mg total) by mouth 3 (three) times daily. 04/27/16   Sable Feil, PA-C  famotidine (PEPCID) 20 MG tablet Take 1 tablet (20 mg total) by mouth 2 (two) times daily. 05/09/16   Carrie Mew, MD  ibuprofen (ADVIL,MOTRIN) 600 MG tablet Take 1 tablet (600 mg total) by mouth every 8 (eight) hours as needed. 04/27/16   Sable Feil, PA-C  metoCLOPramide (REGLAN) 10 MG tablet Take 1 tablet (10 mg total) by mouth every 6 (six) hours as needed. 05/09/16   Carrie Mew, MD  naproxen (NAPROSYN) 500 MG tablet Take 1 tablet (500 mg total) by mouth 2 (two) times daily with a meal. 05/09/16   Carrie Mew, MD  ondansetron (ZOFRAN) 4 MG tablet Take 1 tablet (4 mg total) by mouth every 8 (eight) hours as needed for nausea or vomiting. 03/13/16   Fritzi Mandes, MD  pantoprazole (PROTONIX) 40 MG tablet Take 1 tablet (40 mg total) by mouth daily. Patient not taking: Reported on 05/09/2016 03/12/16   Fritzi Mandes, MD  traMADol (ULTRAM) 50 MG tablet Take 1 tablet (50 mg total) by mouth every 6 (  six) hours as needed. 04/27/16 04/27/17  Sable Feil, PA-C    Allergies Patient has no known allergies.  Family History  Problem Relation Age of Onset  . Hypertension Mother   . Hypertension Father   . Diabetes Father   . Heart disease Father   . Hypertension Sister     Social History Social History  Substance Use Topics  . Smoking status: Current Every Day Smoker    Packs/day: 0.25    Years: 10.00    Types: Cigarettes  . Smokeless tobacco: Never Used  . Alcohol use No    Review of  Systems Constitutional: No fever/chills Eyes: No visual changes. ENT: No sore throat. Cardiovascular: Denies chest pain. Respiratory: Denies shortness of breath. Gastrointestinal:  No nausea, no vomiting.  No diarrhea.  No constipation. Genitourinary: Negative for dysuria. Musculoskeletal: Negative for back pain. Skin: Negative for rash. Neurological: Negative for headaches, focal weakness or numbness.  10-point ROS otherwise negative.  ____________________________________________   PHYSICAL EXAM:  VITAL SIGNS: ED Triage Vitals [06/07/16 0113]  Enc Vitals Group     BP (!) 165/92     Pulse Rate 92     Resp 18     Temp 98.2 F (36.8 C)     Temp Source Oral     SpO2 99 %     Weight 212 lb (96.2 kg)     Height 5\' 7"  (1.702 m)     Head Circumference      Peak Flow      Pain Score 8     Pain Loc      Pain Edu?      Excl. in [place]?     Constitutional: Alert and oriented. Well appearing and in no acute distress. Eyes: Conjunctivae are normal. PERRL. EOMI. Head: Atraumatic. Nose: No congestion/rhinnorhea. Mouth/Throat: Mucous membranes are moist.   Neck: No stridor.   Cardiovascular: Normal rate, regular rhythm. Grossly normal heart sounds.  Good peripheral circulation. Respiratory: Normal respiratory effort.  No retractions. Lungs CTAB. Gastrointestinal: Soft With minimal right lower quadrant tenderness to palpation. The patient says that her pain is usually epigastric/upper abdominal. No distention. No CVA tenderness. Genitourinary: Normal external exam without any lesions. Speculum exam with a small amount of clear discharge. Bimanual exam without CMT. No uterine or left adnexal tenderness nor masses. Mild right adnexal tenderness without any masses palpated. Musculoskeletal: No lower extremity tenderness nor edema.  No joint effusions. Neurologic:  Normal speech and language. No gross focal neurologic deficits are appreciated. Skin:  Skin is warm, dry and intact. No rash  noted. Psychiatric: Mood and affect are normal. Speech and behavior are normal.  ____________________________________________   LABS (all labs ordered are listed, but only abnormal results are displayed)  Labs Reviewed  COMPREHENSIVE METABOLIC PANEL - Abnormal; Notable for the following:       Result Value   Potassium 3.3 (*)    Glucose, Bld 125 (*)    All other components within normal limits  CBC - Abnormal; Notable for the following:    WBC 14.0 (*)    All other components within normal limits  URINALYSIS COMPLETEWITH MICROSCOPIC ([facility] ONLY) - Abnormal; Notable for the following:    Color, Urine YELLOW (*)    APPearance CLEAR (*)    Specific Gravity, Urine 1.032 (*)    Hgb urine dipstick 1+ (*)    Protein, ur 30 (*)    Bacteria, UA RARE (*)    Squamous Epithelial / LPF 6-30 (*)  All other components within normal limits  WET PREP, GENITAL  CHLAMYDIA/NGC RT PCR ([facility] ONLY)  LIPASE, BLOOD  POC URINE PREG, ED  POCT PREGNANCY, URINE   ____________________________________________  EKG   ____________________________________________  RADIOLOGY  CT Abdomen Pelvis W Contrast (Final result)  Result time 06/07/16 06:39:58  Final result by Delphina Cahill, MD (06/07/16 06:39:58)           Narrative:   CLINICAL DATA: Right lower abdominal pain.  EXAM: CT ABDOMEN AND PELVIS WITH CONTRAST  TECHNIQUE: Multidetector CT imaging of the abdomen and pelvis was performed using the standard protocol following bolus administration of intravenous contrast.  CONTRAST: 156mL ISOVUE-300 IOPAMIDOL (ISOVUE-300) INJECTION 61%  COMPARISON: 05/09/2016  FINDINGS: Lower chest: No acute abnormality.  Hepatobiliary: No focal liver abnormality is seen. No gallstones, gallbladder wall thickening, or biliary dilatation.  Pancreas: Unremarkable. No pancreatic ductal dilatation or surrounding inflammatory changes.  Spleen: Normal in size without focal  abnormality.  Adrenals/Urinary Tract: Adrenal glands are unremarkable. Kidneys are normal, without renal calculi, focal lesion, or hydronephrosis. Bladder is unremarkable.  Stomach/Bowel: Stomach is within normal limits. Appendix appears normal. No evidence of bowel wall thickening, distention, or inflammatory changes.  Vascular/Lymphatic: No significant vascular findings are present. No enlarged abdominal or pelvic lymph nodes.  Reproductive: The uterus is very small, probably hypoplastic. Ovaries are moderately prominent. This is unchanged. No abnormal pelvic fluid collections.  Other: No acute inflammatory changes are evident in the abdomen or pelvis. There is no ascites. There is a small fat containing umbilical hernia.  Musculoskeletal: No acute or significant osseous findings.  IMPRESSION: Stable prominence of the ovaries and diminutive size of the uterus. No acute findings are evident in the abdomen or pelvis.   Electronically Signed By: Andreas Newport M.D. On: 06/07/2016 06:39            ____________________________________________   PROCEDURES  Procedure(s) performed:   Procedures  Critical Care performed:   ____________________________________________   INITIAL IMPRESSION / ASSESSMENT AND PLAN / ED COURSE  Pertinent labs & imaging results that were available during my care of the patient were reviewed by me and considered in my medical decision making (see chart for details).    Clinical Course     Patient with multiple CAT scans on the record but she says that her pain is usually epigastric. White blood cell count also chronically elevated. However, with a new pain being to the right lower quadrant we will rescan her as there is a concern for appendicitis or possible ovarian pathology as well.  ----------------------------------------- 7:00 AM on 06/07/2016 -----------------------------------------  Patient resting, but without any  distress at this time. Very reassuring CT scan without any acute changes. We discussed the prominence of her ovaries and the need to follow-up with her OB/GYN. She is understanding of this plan and willing to comply. She says that she is out of her proton pump inhibitor. I will be refilling his prescription. To be discharged home. She understands to return for any worsening or concerning symptoms. Unlikely to be appendicitis. Chronically elevated white blood cell count which is not out of the range of normal for this patient. Also without any GI symptoms and a negative CAT scan. ____________________________________________   FINAL CLINICAL IMPRESSION(S) / ED DIAGNOSES  Right lower quadrant abdominal pain.    NEW MEDICATIONS STARTED DURING THIS VISIT:  New Prescriptions   No medications on file     Note:  This document was prepared using Dragon voice recognition software and  may include unintentional dictation errors.    Orbie Pyo, MD 06/07/16 [phone]  Patient with recent prescription for Protonix but she says that she is need of a new prescription.    Orbie Pyo, MD 06/07/16 [phone]

## 2016-06-07 NOTE — ED Triage Notes (Signed)
Pt has right lower abd pain.   No n/v/d.  No urinary sx. No vag bleeding.

## 2016-06-07 NOTE — ED Notes (Signed)
Patient is aware of elevated bp while here.  She agrees to go to pcp and let her know about it being up. I wrote down last several bps on her instruction sheet.  Also  Says we have changed her previous meds again.  She is going to her pcp and will let her sort out what she needs to be on now.  (said the protonix is expensive with her insurance and she is on another ppi now.)

## 2016-06-12 ENCOUNTER — Emergency Department: Payer: Self-pay

## 2016-06-12 ENCOUNTER — Encounter: Payer: Self-pay | Admitting: Emergency Medicine

## 2016-06-12 ENCOUNTER — Emergency Department
Admission: EM | Admit: 2016-06-12 | Discharge: 2016-06-12 | Disposition: A | Discharge status: Home / Self Care | Payer: Self-pay | Attending: Emergency Medicine | Admitting: Emergency Medicine

## 2016-06-12 DIAGNOSIS — R1013 Epigastric pain: Secondary | ICD-10-CM | POA: Insufficient documentation

## 2016-06-12 DIAGNOSIS — Z79899 Other long term (current) drug therapy: Secondary | ICD-10-CM | POA: Insufficient documentation

## 2016-06-12 DIAGNOSIS — F17210 Nicotine dependence, cigarettes, uncomplicated: Secondary | ICD-10-CM | POA: Insufficient documentation

## 2016-06-12 LAB — URINALYSIS COMPLETE WITH MICROSCOPIC (ARMC ONLY)
BILIRUBIN URINE: NEGATIVE
GLUCOSE, UA: NEGATIVE mg/dL
LEUKOCYTES UA: NEGATIVE
Nitrite: NEGATIVE
PH: 5 (ref 5.0–8.0)
Protein, ur: 100 mg/dL — AB
SPECIFIC GRAVITY, URINE: 1.028 (ref 1.005–1.030)

## 2016-06-12 LAB — URINE DRUG SCREEN, QUALITATIVE (ARMC ONLY)
AMPHETAMINES, UR SCREEN: NOT DETECTED
Barbiturates, Ur Screen: POSITIVE — AB
Benzodiazepine, Ur Scrn: POSITIVE — AB
COCAINE METABOLITE, UR ~~LOC~~: NOT DETECTED
Cannabinoid 50 Ng, Ur ~~LOC~~: POSITIVE — AB
MDMA (ECSTASY) UR SCREEN: NOT DETECTED
METHADONE SCREEN, URINE: NOT DETECTED
OPIATE, UR SCREEN: NOT DETECTED
Phencyclidine (PCP) Ur S: NOT DETECTED
TRICYCLIC, UR SCREEN: NOT DETECTED

## 2016-06-12 LAB — POCT PREGNANCY, URINE: Preg Test, Ur: NEGATIVE

## 2016-06-12 LAB — COMPREHENSIVE METABOLIC PANEL
ALT: 22 U/L (ref 14–54)
ANION GAP: 11 (ref 5–15)
AST: 28 U/L (ref 15–41)
Albumin: 4.9 g/dL (ref 3.5–5.0)
Alkaline Phosphatase: 97 U/L (ref 38–126)
BUN: 9 mg/dL (ref 6–20)
CHLORIDE: 104 mmol/L (ref 101–111)
CO2: 23 mmol/L (ref 22–32)
Calcium: 9.4 mg/dL (ref 8.9–10.3)
Creatinine, Ser: 0.61 mg/dL (ref 0.44–1.00)
Glucose, Bld: 185 mg/dL — ABNORMAL HIGH (ref 65–99)
POTASSIUM: 3.5 mmol/L (ref 3.5–5.1)
SODIUM: 138 mmol/L (ref 135–145)
Total Bilirubin: 0.7 mg/dL (ref 0.3–1.2)
Total Protein: 8.5 g/dL — ABNORMAL HIGH (ref 6.5–8.1)

## 2016-06-12 LAB — CBC
HCT: 46.4 % (ref 35.0–47.0)
HEMOGLOBIN: 15.7 g/dL (ref 12.0–16.0)
MCH: 29.4 pg (ref 26.0–34.0)
MCHC: 33.9 g/dL (ref 32.0–36.0)
MCV: 86.8 fL (ref 80.0–100.0)
PLATELETS: 365 10*3/uL (ref 150–440)
RBC: 5.35 MIL/uL — AB (ref 3.80–5.20)
RDW: 13.7 % (ref 11.5–14.5)
WBC: 19.3 10*3/uL — AB (ref 3.6–11.0)

## 2016-06-12 LAB — TROPONIN I

## 2016-06-12 LAB — LIPASE, BLOOD: LIPASE: 23 U/L (ref 11–51)

## 2016-06-12 MED ORDER — HYDROMORPHONE HCL 1 MG/ML IJ SOLN
1.0000 mg | Freq: Once | INTRAMUSCULAR | Status: AC
  Administered 2016-06-12: 1 mg via INTRAVENOUS
  Filled 2016-06-12: qty 1

## 2016-06-12 MED ORDER — METOCLOPRAMIDE HCL 5 MG/ML IJ SOLN
10.0000 mg | Freq: Once | INTRAMUSCULAR | Status: AC
  Administered 2016-06-12: 10 mg via INTRAVENOUS
  Filled 2016-06-12: qty 2

## 2016-06-12 MED ORDER — ONDANSETRON HCL 4 MG/2ML IJ SOLN
4.0000 mg | Freq: Once | INTRAMUSCULAR | Status: AC
  Administered 2016-06-12: 4 mg via INTRAVENOUS
  Filled 2016-06-12: qty 2

## 2016-06-12 MED ORDER — SODIUM CHLORIDE 0.9 % IV BOLUS (SEPSIS)
1000.0000 mL | Freq: Once | INTRAVENOUS | Status: AC
  Administered 2016-06-12: 1000 mL via INTRAVENOUS

## 2016-06-12 MED ORDER — LORAZEPAM 2 MG/ML IJ SOLN
1.0000 mg | Freq: Once | INTRAMUSCULAR | Status: AC
  Administered 2016-06-12: 1 mg via INTRAVENOUS
  Filled 2016-06-12: qty 1

## 2016-06-12 MED ORDER — TRAMADOL HCL 50 MG PO TABS
50.0000 mg | ORAL_TABLET | Freq: Once | ORAL | Status: AC
  Administered 2016-06-12: 50 mg via ORAL
  Filled 2016-06-12: qty 1

## 2016-06-12 MED ORDER — LIDOCAINE 5 % EX PTCH: 1.0000 | MEDICATED_PATCH | CUTANEOUS | 0 refills | Status: DC

## 2016-06-12 MED ORDER — ONDANSETRON 4 MG PO TBDP
4.0000 mg | ORAL_TABLET | Freq: Once | ORAL | Status: AC
  Administered 2016-06-12: 4 mg via ORAL

## 2016-06-12 MED ORDER — KETOROLAC TROMETHAMINE 30 MG/ML IJ SOLN
30.0000 mg | Freq: Once | INTRAMUSCULAR | Status: AC
  Administered 2016-06-12: 30 mg via INTRAVENOUS
  Filled 2016-06-12: qty 1

## 2016-06-12 MED ORDER — SODIUM CHLORIDE 0.9 % IV BOLUS (SEPSIS)
500.0000 mL | Freq: Once | INTRAVENOUS | Status: AC
  Administered 2016-06-12: 500 mL via INTRAVENOUS

## 2016-06-12 MED ORDER — HYDRALAZINE HCL 20 MG/ML IJ SOLN
5.0000 mg | Freq: Once | INTRAMUSCULAR | Status: AC
  Administered 2016-06-12: 5 mg via INTRAVENOUS
  Filled 2016-06-12: qty 1

## 2016-06-12 MED ORDER — METOCLOPRAMIDE HCL 10 MG PO TABS: 10.0000 mg | ORAL_TABLET | Freq: Three times a day (TID) | ORAL | 0 refills | Status: DC

## 2016-06-12 MED ORDER — GI COCKTAIL ~~LOC~~
30.0000 mL | Freq: Once | ORAL | Status: AC
  Administered 2016-06-12: 30 mL via ORAL
  Filled 2016-06-12: qty 30

## 2016-06-12 MED ORDER — SODIUM CHLORIDE 0.9 % IV BOLUS (SEPSIS)
1000.0000 mL | Freq: Once | INTRAVENOUS | Status: AC
  Administered 2016-06-12: 1000 mL via INTRAVENOUS
  Filled 2016-06-12: qty 1000

## 2016-06-12 NOTE — ED Provider Notes (Signed)
[facility] Emergency Department Provider Note   ____________________________________________   First MD Initiated Contact with Patient 06/12/16 [phone]     (approximate)  I have reviewed the triage vital signs and the nursing notes.   HISTORY  Chief Complaint Abdominal Pain    HPI Jasmine Mason is a 30 y.o. female who comes into the hospital today with vomiting for the past 2 days and epigastric pain. She reports that she has not taken anything for pain. She reports that she had been given protonic and another medicine but she is out of it. The patient has a history of a hiatal hernia and has had similar pain in the past. The patient rates her pain a 10 out of 10 in intensity. She reports that she has been burping up blood and vomiting blood as well. She reports this not streaks but it is quite a bit. The patient denies any diarrhea. She last had symptoms about a week ago and was seen here. This is when she was given the protonic's. The patient also has had some elevated blood pressure for the past 3 days in the 201 90s. She reports that she could not tolerate the symptoms at work so she decided to come in for evaluation. She reports that she's had some chest pain and shortness of breath as well as some sweats. She denies any diarrhea or problems with urination. The patient is here today for evaluation.   Past Medical History:  Diagnosis Date  . Back pain   . Back pain   . GERD (gastroesophageal reflux disease)   . Mesenteric adenitis   . Obesity   . Tobacco abuse     Patient Active Problem List   Diagnosis Date Noted  . Nausea with vomiting   . Hiatal hernia   . Uncontrollable vomiting 03/10/2016  . Abdominal pain, epigastric   . GERD (gastroesophageal reflux disease) 01/24/2015  . Hypokalemia 01/24/2015  . Abdominal pain 01/24/2015  . Leukocytosis 01/24/2015  . Chest pain 01/24/2015  . Cough 01/24/2015  . Pain of left calf 01/24/2015  .  Elevated troponin 01/24/2015  . Obesity   . Mesenteric adenitis   . Tobacco abuse     Past Surgical History:  Procedure Laterality Date  . CARPAL TUNNEL RELEASE Left ~ 2013  . ESOPHAGOGASTRODUODENOSCOPY N/A 01/25/2015   Procedure: ESOPHAGOGASTRODUODENOSCOPY (EGD);  Surgeon: Irene Shipper, MD;  Location[facility] ENDOSCOPY;  Service: Endoscopy;  Laterality: N/A;  . ESOPHAGOGASTRODUODENOSCOPY (EGD) WITH PROPOFOL N/A 03/13/2016   Procedure: ESOPHAGOGASTRODUODENOSCOPY (EGD) WITH PROPOFOL;  Surgeon: Lucilla Lame, MD;  Location: [facility] ENDOSCOPY;  Service: Endoscopy;  Laterality: N/A;  . FRACTURE SURGERY    . ORIF ANKLE FRACTURE Left 1990's    Prior to Admission medications   Medication Sig Start Date End Date Taking? Authorizing Provider  pantoprazole (PROTONIX) 40 MG tablet Take 1 tablet (40 mg total) by mouth daily. 06/07/16 06/07/17 Yes Orbie Pyo, MD  aluminum-magnesium hydroxide-simethicone (MAALOX) 200-200-20 MG/5ML SUSP Take 30 mLs by mouth 4 (four) times daily -  before meals and at bedtime. Patient not taking: Reported on 06/12/2016 05/09/16   Carrie Mew, MD  amoxicillin (AMOXIL) 500 MG capsule Take 1 capsule (500 mg total) by mouth 3 (three) times daily. Patient not taking: Reported on 06/12/2016 04/27/16   Sable Feil, PA-C  famotidine (PEPCID) 20 MG tablet Take 1 tablet (20 mg total) by mouth 2 (two) times daily. Patient not taking: Reported on 06/12/2016 05/09/16   Carrie Mew,  MD  ibuprofen (ADVIL,MOTRIN) 600 MG tablet Take 1 tablet (600 mg total) by mouth every 8 (eight) hours as needed. Patient not taking: Reported on 06/12/2016 04/27/16   Sable Feil, PA-C  metoCLOPramide (REGLAN) 10 MG tablet Take 1 tablet (10 mg total) by mouth every 6 (six) hours as needed. Patient not taking: Reported on 06/12/2016 05/09/16   Carrie Mew, MD  naproxen (NAPROSYN) 500 MG tablet Take 1 tablet (500 mg total) by mouth 2 (two) times daily with a meal. Patient not taking:  Reported on 06/12/2016 05/09/16   Carrie Mew, MD  ondansetron (ZOFRAN) 4 MG tablet Take 1 tablet (4 mg total) by mouth every 8 (eight) hours as needed for nausea or vomiting. Patient not taking: Reported on 06/12/2016 03/13/16   Fritzi Mandes, MD  traMADol (ULTRAM) 50 MG tablet Take 1 tablet (50 mg total) by mouth every 6 (six) hours as needed. Patient not taking: Reported on 06/12/2016 04/27/16 04/27/17  Sable Feil, PA-C    Allergies Patient has no known allergies.  Family History  Problem Relation Age of Onset  . Hypertension Mother   . Hypertension Father   . Diabetes Father   . Heart disease Father   . Hypertension Sister     Social History Social History  Substance Use Topics  . Smoking status: Current Every Day Smoker    Packs/day: 0.25    Years: 10.00    Types: Cigarettes  . Smokeless tobacco: Never Used  . Alcohol use No    Review of Systems Constitutional: No fever/chills Eyes: No visual changes. ENT: No sore throat. Cardiovascular: Denies chest pain. Respiratory: Denies shortness of breath. Gastrointestinal:  abdominal pain.  nausea, vomiting.  No diarrhea.  No constipation. Genitourinary: Negative for dysuria. Musculoskeletal: Negative for back pain. Skin: Negative for rash. Neurological: Negative for headaches, focal weakness or numbness.  10-point ROS otherwise negative.  ____________________________________________   PHYSICAL EXAM:  VITAL SIGNS: ED Triage Vitals [06/12/16 0212]  Enc Vitals Group     BP (!) 171/108     Pulse Rate (!) 103     Resp 18     Temp 98.5 F (36.9 C)     Temp Source Oral     SpO2 100 %     Weight 212 lb (96.2 kg)     Height 5\' 7"  (1.702 m)     Head Circumference      Peak Flow      Pain Score 9     Pain Loc      Pain Edu?      Excl. in [place]?     Constitutional: Alert and oriented. Well appearing and in moderate distress. Eyes: Conjunctivae are normal. PERRL. EOMI. Head: Atraumatic. Nose: No  congestion/rhinnorhea. Mouth/Throat: Mucous membranes are moist.  Oropharynx non-erythematous. Cardiovascular: Normal rate, regular rhythm. Grossly normal heart sounds.  Good peripheral circulation. Respiratory: Normal respiratory effort.  No retractions. Lungs CTAB. Gastrointestinal: Soft with tenderness to palpation. No distention. Positive bowel sounds Musculoskeletal: No lower extremity tenderness nor edema.   Neurologic:  Normal speech and language.  Skin:  Skin is warm, dry and intact.  Psychiatric: Mood and affect are normal.   ____________________________________________   LABS (all labs ordered are listed, but only abnormal results are displayed)  Labs Reviewed  COMPREHENSIVE METABOLIC PANEL - Abnormal; Notable for the following:       Result Value   Glucose, Bld 185 (*)    Total Protein 8.5 (*)    All other components  within normal limits  CBC - Abnormal; Notable for the following:    WBC 19.3 (*)    RBC 5.35 (*)    All other components within normal limits  LIPASE, BLOOD  TROPONIN I  URINALYSIS COMPLETEWITH MICROSCOPIC ([facility] ONLY)  POC URINE PREG, ED   ____________________________________________  EKG  none ____________________________________________  RADIOLOGY  [place] abd ____________________________________________   PROCEDURES  Procedure(s) performed: None  Procedures  Critical Care performed: No  ____________________________________________   INITIAL IMPRESSION / ASSESSMENT AND PLAN / ED COURSE  Pertinent labs & imaging results that were available during my care of the patient were reviewed by me and considered in my medical decision making (see chart for details).  This is a 30 year old female with a history of gastroparesis who comes into the hospital today with epigastric pain and vomiting as well as abdominal pain. The patient has been here recently and had a CT scan that was negative. She does have elevated white blood cell count which may  be due to acute phase reactive. I will give the patient a dose of Zofran as well as tramadol. I will give the patient some fluid and she'll be reassessed.  Clinical Course    The patient's blood pressure has been steadily creeping up while here in the emergency department. I'll give her some hydralazine for her blood pressure. She will receive a GI cocktail as well. I gave her some Ativan and some Toradol hoping it would help with her vomiting. The patient's care was signed out to Dr. Jacqulyn Bath follow-up the results of the ultrasound given the patient's elevated white blood cell count and her continue to elevate her blood pressure. I will give her dose of morphine for her pain.  ____________________________________________   FINAL CLINICAL IMPRESSION(S) / ED DIAGNOSES  Final diagnoses:  Epigastric pain      NEW MEDICATIONS STARTED DURING THIS VISIT:  New Prescriptions   No medications on file     Note:  This document was prepared using Dragon voice recognition software and may include unintentional dictation errors.    Loney Hering, MD 06/12/16 [phone]

## 2016-06-12 NOTE — ED Notes (Signed)
Gave report to [employer]

## 2016-06-12 NOTE — ED Notes (Signed)
Dr. Dahlia Client aware of elevated BP.

## 2016-06-12 NOTE — ED Triage Notes (Signed)
Patient ambulatory to triage with steady gait, without difficulty or distress noted; pt reports lower abd pain accomp by N/V; here last week for same and dx reflux

## 2016-06-12 NOTE — ED Notes (Signed)
ED Provider at bedside. 

## 2016-06-12 NOTE — ED Provider Notes (Signed)
-----------------------------------------   1:13 PM on 06/12/2016 -----------------------------------------   Blood pressure (!) 151/87, pulse 86, temperature 98.5 F (36.9 C), temperature source Oral, resp. rate 20, height 5\' 7"  (1.702 m), weight 212 lb (96.2 kg), last menstrual period 05/22/2016, SpO2 97 %.  Assuming care from Dr. Dahlia Client.  In short, Jasmine Mason is a 30 y.o. female with a chief complaint of Abdominal Pain .  Refer to the original H&P for additional details.  The current plan of care is to * Follow results of the patient's ultrasound which were negative. Patient still had symptoms consistent with gastroparesis. She was given 1 more round of IV fluids and IV pain medications etc. Her blood pressure came down with hydralazine and she was able tolerate oral fluids. Her urine drug screen came back again positive for marijuana she's been advised to stop smoking marijuana sometimes it can cause a cyclic vomiting syndrome.  " New Prescriptions   LIDOCAINE (LIDODERM) 5 %    Place 1 patch onto the skin daily.   METOCLOPRAMIDE (REGLAN) 10 MG TABLET    Take 1 tablet (10 mg total) by mouth 3 (three) times daily with meals.  " Patient was advised to return immediately if condition worsens. Patient was advised to follow up with their primary care physician or other specialized physicians involved in their outpatient care. The patient and/or family member/power of attorney had laboratory results reviewed at the bedside. All questions and concerns were addressed and appropriate discharge instructions were distributed by the nursing staff. Patient was advised contact her gastroenterologist for further outpatient follow-up and treatment    Daymon Larsen, MD 06/12/16 1314

## 2016-06-12 NOTE — ED Notes (Signed)
Pt advised we need to collect urine specimen.

## 2016-06-13 ENCOUNTER — Encounter (HOSPITAL_BASED_OUTPATIENT_CLINIC_OR_DEPARTMENT_OTHER): Payer: Self-pay | Admitting: Emergency Medicine

## 2016-06-13 ENCOUNTER — Emergency Department (HOSPITAL_BASED_OUTPATIENT_CLINIC_OR_DEPARTMENT_OTHER)
Admission: EM | Admit: 2016-06-13 | Discharge: 2016-06-13 | Disposition: A | Discharge status: Home / Self Care | Payer: Self-pay | Attending: Emergency Medicine | Admitting: Emergency Medicine

## 2016-06-13 DIAGNOSIS — R112 Nausea with vomiting, unspecified: Secondary | ICD-10-CM | POA: Insufficient documentation

## 2016-06-13 DIAGNOSIS — F17210 Nicotine dependence, cigarettes, uncomplicated: Secondary | ICD-10-CM | POA: Insufficient documentation

## 2016-06-13 DIAGNOSIS — E876 Hypokalemia: Secondary | ICD-10-CM | POA: Insufficient documentation

## 2016-06-13 DIAGNOSIS — Z791 Long term (current) use of non-steroidal anti-inflammatories (NSAID): Secondary | ICD-10-CM | POA: Insufficient documentation

## 2016-06-13 LAB — LIPASE, BLOOD: Lipase: 20 U/L (ref 11–51)

## 2016-06-13 LAB — CBC WITH DIFFERENTIAL/PLATELET
Basophils Absolute: 0 10*3/uL (ref 0.0–0.1)
Basophils Relative: 0 %
EOS ABS: 0.1 10*3/uL (ref 0.0–0.7)
Eosinophils Relative: 1 %
HEMATOCRIT: 42.2 % (ref 36.0–46.0)
HEMOGLOBIN: 14.3 g/dL (ref 12.0–15.0)
LYMPHS ABS: 2.7 10*3/uL (ref 0.7–4.0)
LYMPHS PCT: 15 %
MCH: 29.6 pg (ref 26.0–34.0)
MCHC: 33.9 g/dL (ref 30.0–36.0)
MCV: 87.4 fL (ref 78.0–100.0)
MONOS PCT: 5 %
Monocytes Absolute: 1 10*3/uL (ref 0.1–1.0)
NEUTROS ABS: 14.8 10*3/uL — AB (ref 1.7–7.7)
NEUTROS PCT: 79 %
Platelets: 343 10*3/uL (ref 150–400)
RBC: 4.83 MIL/uL (ref 3.87–5.11)
RDW: 13.4 % (ref 11.5–15.5)
WBC: 18.6 10*3/uL — AB (ref 4.0–10.5)

## 2016-06-13 LAB — COMPREHENSIVE METABOLIC PANEL
ALK PHOS: 80 U/L (ref 38–126)
ALT: 20 U/L (ref 14–54)
ANION GAP: 9 (ref 5–15)
AST: 24 U/L (ref 15–41)
Albumin: 4.5 g/dL (ref 3.5–5.0)
BILIRUBIN TOTAL: 0.5 mg/dL (ref 0.3–1.2)
BUN: 10 mg/dL (ref 6–20)
CALCIUM: 8.8 mg/dL — AB (ref 8.9–10.3)
CO2: 24 mmol/L (ref 22–32)
CREATININE: 0.61 mg/dL (ref 0.44–1.00)
Chloride: 105 mmol/L (ref 101–111)
GFR calc non Af Amer: >60 mL/min (ref >60)
GLUCOSE: 127 mg/dL — AB (ref 65–99)
Potassium: 3.1 mmol/L — ABNORMAL LOW (ref 3.5–5.1)
SODIUM: 138 mmol/L (ref 135–145)
TOTAL PROTEIN: 7.6 g/dL (ref 6.5–8.1)

## 2016-06-13 MED ORDER — POTASSIUM CHLORIDE CRYS ER 20 MEQ PO TBCR
60.0000 meq | EXTENDED_RELEASE_TABLET | Freq: Once | ORAL | Status: AC
Start: 2016-06-13 — End: 2016-06-13
  Administered 2016-06-13: 60 meq via ORAL
  Filled 2016-06-13: qty 3

## 2016-06-13 MED ORDER — PROMETHAZINE HCL 25 MG/ML IJ SOLN
25.0000 mg | Freq: Once | INTRAMUSCULAR | Status: AC
  Administered 2016-06-13: 25 mg via INTRAVENOUS
  Filled 2016-06-13: qty 1

## 2016-06-13 MED ORDER — HALOPERIDOL LACTATE 5 MG/ML IJ SOLN
2.0000 mg | Freq: Once | INTRAMUSCULAR | Status: AC
  Administered 2016-06-13: 2 mg via INTRAVENOUS
  Filled 2016-06-13: qty 1

## 2016-06-13 MED ORDER — SODIUM CHLORIDE 0.9 % IV BOLUS (SEPSIS)
1000.0000 mL | Freq: Once | INTRAVENOUS | Status: AC
  Administered 2016-06-13: 1000 mL via INTRAVENOUS

## 2016-06-13 MED ORDER — HYDROMORPHONE HCL 1 MG/ML IJ SOLN
1.0000 mg | Freq: Once | INTRAMUSCULAR | Status: DC
  Filled 2016-06-13: qty 1

## 2016-06-13 NOTE — ED Notes (Signed)
Attempted IV access x 1 - unsuccessful.

## 2016-06-13 NOTE — ED Provider Notes (Signed)
[place] DEPT [facility] Provider Note   CSN: [id number] Arrival date & time: 06/13/16  0911     History   Chief Complaint Chief Complaint  Patient presents with  . Emesis    HPI Jasmine Mason is a 30 y.o. female who presents to the ED with cc of epigastric pain, nausea, vomiting and burning chest pain. Patient was seen yesterday at [facility] emergency Department. She had a negative pregnancy test and a UDS positive for marijuana, barbiturates and benzodiazepines. She has a past history of previous self limited episodes of epigastric pain and vomiting. Patient feels lightheaded and rates her nausea and pain at 10 out of 10. She did not get her medications filled yesterday because since she got home she began vomiting again. Denies fevers, chills, myalgias, arthralgias. Denies DOE, SOB, chest tightness or pressure, radiation to left arm, jaw or back, or diaphoresis. Denies dysuria, flank pain, suprapubic pain, frequency, urgency, or hematuria. Denies  weakness, visual disturbances. Denies  diarrhea or constipation.   HPI  Past Medical History:  Diagnosis Date  . Back pain   . Back pain   . GERD (gastroesophageal reflux disease)   . Mesenteric adenitis   . Obesity   . Tobacco abuse     Patient Active Problem List   Diagnosis Date Noted  . Nausea with vomiting   . Hiatal hernia   . Uncontrollable vomiting 03/10/2016  . Abdominal pain, epigastric   . GERD (gastroesophageal reflux disease) 01/24/2015  . Hypokalemia 01/24/2015  . Abdominal pain 01/24/2015  . Leukocytosis 01/24/2015  . Chest pain 01/24/2015  . Cough 01/24/2015  . Pain of left calf 01/24/2015  . Elevated troponin 01/24/2015  . Obesity   . Mesenteric adenitis   . Tobacco abuse     Past Surgical History:  Procedure Laterality Date  . CARPAL TUNNEL RELEASE Left ~ 2013  . ESOPHAGOGASTRODUODENOSCOPY N/A 01/25/2015   Procedure: ESOPHAGOGASTRODUODENOSCOPY (EGD);  Surgeon: Irene Shipper, MD;  Location:  [facility] ENDOSCOPY;  Service: Endoscopy;  Laterality: N/A;  . ESOPHAGOGASTRODUODENOSCOPY (EGD) WITH PROPOFOL N/A 03/13/2016   Procedure: ESOPHAGOGASTRODUODENOSCOPY (EGD) WITH PROPOFOL;  Surgeon: Lucilla Lame, MD;  Location: [facility] ENDOSCOPY;  Service: Endoscopy;  Laterality: N/A;  . FRACTURE SURGERY    . ORIF ANKLE FRACTURE Left 1990's    OB History    No data available       Home Medications    Prior to Admission medications   Medication Sig Start Date End Date Taking? Authorizing Provider  aluminum-magnesium hydroxide-simethicone (MAALOX) I037812 MG/5ML SUSP Take 30 mLs by mouth 4 (four) times daily -  before meals and at bedtime. Patient not taking: Reported on 06/12/2016 05/09/16   Carrie Mew, MD  amoxicillin (AMOXIL) 500 MG capsule Take 1 capsule (500 mg total) by mouth 3 (three) times daily. Patient not taking: Reported on 06/12/2016 04/27/16   Sable Feil, PA-C  famotidine (PEPCID) 20 MG tablet Take 1 tablet (20 mg total) by mouth 2 (two) times daily. Patient not taking: Reported on 06/12/2016 05/09/16   Carrie Mew, MD  ibuprofen (ADVIL,MOTRIN) 600 MG tablet Take 1 tablet (600 mg total) by mouth every 8 (eight) hours as needed. Patient not taking: Reported on 06/12/2016 04/27/16   Sable Feil, PA-C  lidocaine (LIDODERM) 5 % Place 1 patch onto the skin daily. 06/12/16   Daymon Larsen, MD  metoCLOPramide (REGLAN) 10 MG tablet Take 1 tablet (10 mg total) by mouth 3 (three) times daily with meals. 06/12/16 06/22/16  Denice Bors  Marcelene Butte, MD  naproxen (NAPROSYN) 500 MG tablet Take 1 tablet (500 mg total) by mouth 2 (two) times daily with a meal. Patient not taking: Reported on 06/12/2016 05/09/16   Carrie Mew, MD  ondansetron (ZOFRAN) 4 MG tablet Take 1 tablet (4 mg total) by mouth every 8 (eight) hours as needed for nausea or vomiting. Patient not taking: Reported on 06/12/2016 03/13/16   Fritzi Mandes, MD  pantoprazole (PROTONIX) 40 MG tablet Take 1 tablet (40 mg total) by  mouth daily. 06/07/16 06/07/17  Orbie Pyo, MD  traMADol (ULTRAM) 50 MG tablet Take 1 tablet (50 mg total) by mouth every 6 (six) hours as needed. Patient not taking: Reported on 06/12/2016 04/27/16 04/27/17  Sable Feil, PA-C    Family History Family History  Problem Relation Age of Onset  . Hypertension Mother   . Hypertension Father   . Diabetes Father   . Heart disease Father   . Hypertension Sister     Social History Social History  Substance Use Topics  . Smoking status: Current Every Day Smoker    Packs/day: 0.25    Years: 10.00    Types: Cigarettes  . Smokeless tobacco: Never Used  . Alcohol use No     Allergies   Patient has no known allergies.   Review of Systems Review of Systems  Ten systems reviewed and are negative for acute change, except as noted in the HPI.   Physical Exam Updated Vital Signs BP 110/70   Pulse 85   Temp 98.1 F (36.7 C) (Oral)   Resp 16   Ht 5\' 7"  (1.702 m)   Wt 96.2 kg   LMP 05/22/2016   SpO2 99%   BMI 33.20 kg/m   Physical Exam  Constitutional: She is oriented to person, place, and time. She appears well-developed and well-nourished. No distress.  HENT:  Head: Normocephalic and atraumatic.  Eyes: Conjunctivae are normal. No scleral icterus.  Neck: Normal range of motion.  Cardiovascular: Normal rate, regular rhythm and normal heart sounds.  Exam reveals no gallop and no friction rub.   No murmur heard. Pulmonary/Chest: Effort normal and breath sounds normal. No respiratory distress.  Abdominal: Soft. Bowel sounds are normal. She exhibits no distension and no mass. There is tenderness. There is guarding.  Neurological: She is alert and oriented to person, place, and time.  Skin: Skin is warm and dry. She is not diaphoretic.  Nursing note and vitals reviewed.    ED Treatments / Results  Labs (all labs ordered are listed, but only abnormal results are displayed) Labs Reviewed  CBC WITH  DIFFERENTIAL/PLATELET - Abnormal; Notable for the following:       Result Value   WBC 18.6 (*)    Neutro Abs 14.8 (*)    All other components within normal limits  COMPREHENSIVE METABOLIC PANEL - Abnormal; Notable for the following:    Potassium 3.1 (*)    Glucose, Bld 127 (*)    Calcium 8.8 (*)    All other components within normal limits  LIPASE, BLOOD    EKG  EKG Interpretation  Date/Time:  Wednesday June 13 2016 09:27:38 EST Ventricular Rate:  71 PR Interval:    QRS Duration: 91 QT Interval:  411 QTC Calculation: 447 R Axis:   59 Text Interpretation:  Sinus rhythm When compared with ECG of 05/08/2016 T wave abnormality Inferior leads is no longer Present Confirmed by [facility]  MD, Nunzio Cory [phone]) on 06/14/2016 9:24:20 PM  Radiology No results found.  Procedures Procedures (including critical care time)  Medications Ordered in ED Medications  sodium chloride 0.9 % bolus 1,000 mL (0 mLs Intravenous Stopped 06/13/16 1359)  haloperidol lactate (HALDOL) injection 2 mg (2 mg Intravenous Given 06/13/16 1022)  potassium chloride SA (K-DUR,KLOR-CON) CR tablet 60 mEq (60 mEq Oral Given 06/13/16 1306)  promethazine (PHENERGAN) injection 25 mg (25 mg Intravenous Given 06/13/16 1241)     Initial Impression / Assessment and Plan / ED Course  I have reviewed the triage vital signs and the nursing notes.  Pertinent labs & imaging results that were available during my care of the patient were reviewed by me and considered in my medical decision making (see chart for details).  Clinical Course as of Jun 16 1411  Wed Jun 13, 2016  [id number] Patient returns for n/v. BP elevalted, patient appears uncomfortable and squirms on the bed.  U waves present on EKG . Will check potassium Haldol and fluids to start. No QT prolongation  [AH]    Clinical Course User Index [AH] Margarita Mail, PA-C     patient with n/v Although her nausea returned she did not have any repeat  vomiting in the Ed Able to replete her potassium orally.  Advise the patient to fill the scripts she was given yesterday. Tolerating PO fluids. Appears safe for discharge. Discussed need to discontinue marijuana.  Final Clinical Impressions(s) / ED Diagnoses   Final diagnoses:  Non-intractable vomiting with nausea, unspecified vomiting type  Hypokalemia    New Prescriptions Discharge Medication List as of 06/13/2016  1:42 PM       Margarita Mail, PA-C 06/15/16 1412    Merrily Pew, MD 06/17/16 1726

## 2016-06-13 NOTE — Discharge Instructions (Signed)
You need to fill and take the medications prescribed to you yesterday . Follow the instructions below even though these are pediatric instructions.   Continue frequent small sips (10-20 ml) of clear liquids every 5-10 minutes. For infants, pedialyte is a good option. For older children over age 32 years, gatorade or powerade are good options. Avoid milk, orange juice, and grape juice for now. May give him or her zofran every 6hr as needed for nausea/vomiting. Once your child has not had further vomiting with the small sips for 4 hours, you may begin to give him or her larger volumes of fluids at a time and give them a bland diet which may include saltine crackers, applesauce, breads, pastas, bananas, bland chicken. If he/she continues to vomit despite zofran, return to the ED for repeat evaluation.

## 2016-06-13 NOTE — ED Triage Notes (Signed)
Pt c/o NV x 4d; was seen at [facility] for same; did not get rx's filled

## 2016-06-13 NOTE — ED Notes (Signed)
Patient unhooked from monitor and walked to restroom with no assistance. Patient hooked back up to monitor upon return to room.

## 2016-06-19 ENCOUNTER — Emergency Department (HOSPITAL_BASED_OUTPATIENT_CLINIC_OR_DEPARTMENT_OTHER): Payer: No Typology Code available for payment source

## 2016-06-19 ENCOUNTER — Encounter (HOSPITAL_BASED_OUTPATIENT_CLINIC_OR_DEPARTMENT_OTHER): Payer: Self-pay

## 2016-06-19 ENCOUNTER — Emergency Department (HOSPITAL_BASED_OUTPATIENT_CLINIC_OR_DEPARTMENT_OTHER)
Admission: EM | Admit: 2016-06-19 | Discharge: 2016-06-19 | Disposition: A | Discharge status: Home / Self Care | Payer: No Typology Code available for payment source | Attending: Emergency Medicine | Admitting: Emergency Medicine

## 2016-06-19 DIAGNOSIS — S4991XA Unspecified injury of right shoulder and upper arm, initial encounter: Secondary | ICD-10-CM | POA: Diagnosis present

## 2016-06-19 DIAGNOSIS — M25511 Pain in right shoulder: Secondary | ICD-10-CM | POA: Insufficient documentation

## 2016-06-19 DIAGNOSIS — Y939 Activity, unspecified: Secondary | ICD-10-CM | POA: Diagnosis not present

## 2016-06-19 DIAGNOSIS — Y999 Unspecified external cause status: Secondary | ICD-10-CM | POA: Diagnosis not present

## 2016-06-19 DIAGNOSIS — Y92410 Unspecified street and highway as the place of occurrence of the external cause: Secondary | ICD-10-CM | POA: Diagnosis not present

## 2016-06-19 DIAGNOSIS — F17210 Nicotine dependence, cigarettes, uncomplicated: Secondary | ICD-10-CM | POA: Diagnosis not present

## 2016-06-19 MED ORDER — HYDROCODONE-ACETAMINOPHEN 5-325 MG PO TABS: 1.0000 | ORAL_TABLET | ORAL | 0 refills | Status: DC | PRN

## 2016-06-19 MED ORDER — HYDROCODONE-ACETAMINOPHEN 5-325 MG PO TABS
1.0000 | ORAL_TABLET | Freq: Once | ORAL | Status: AC
  Administered 2016-06-19: 1 via ORAL
  Filled 2016-06-19: qty 1

## 2016-06-19 MED ORDER — NAPROXEN 250 MG PO TABS
500.0000 mg | ORAL_TABLET | Freq: Once | ORAL | Status: AC
  Administered 2016-06-19: 500 mg via ORAL
  Filled 2016-06-19: qty 2

## 2016-06-19 MED ORDER — NAPROXEN 500 MG PO TABS: 500.0000 mg | ORAL_TABLET | Freq: Two times a day (BID) | ORAL | 0 refills | Status: DC

## 2016-06-19 MED FILL — NAPROXEN 500 MG TABLET: 500 | 15 days supply | Qty: 30 | Fill #0

## 2016-06-19 MED FILL — HYDROCODON-APAP 5-325: 5-325 | 2 days supply | Qty: 10 | Fill #0
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
## 2016-06-19 NOTE — ED Notes (Signed)
Patient transported to X-ray 
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
## 2016-06-19 NOTE — ED Notes (Signed)
Pt directed to pharmacy to pick up Rx. Pt has a ride at bedside 

## 2016-06-19 NOTE — ED Notes (Signed)
Pt directed to pharmacy to pick up Rx. Pt has a ride

## 2016-06-28 ENCOUNTER — Ambulatory Visit: Payer: Self-pay | Admitting: Family Medicine

## 2016-07-10 DIAGNOSIS — D72829 Elevated white blood cell count, unspecified: Secondary | ICD-10-CM | POA: Insufficient documentation

## 2016-07-10 DIAGNOSIS — F17210 Nicotine dependence, cigarettes, uncomplicated: Secondary | ICD-10-CM | POA: Insufficient documentation

## 2016-07-10 DIAGNOSIS — Z79899 Other long term (current) drug therapy: Secondary | ICD-10-CM | POA: Insufficient documentation

## 2016-07-10 DIAGNOSIS — K3184 Gastroparesis: Secondary | ICD-10-CM | POA: Insufficient documentation

## 2016-07-11 ENCOUNTER — Emergency Department
Admission: EM | Admit: 2016-07-11 | Discharge: 2016-07-11 | Disposition: A | Discharge status: Home / Self Care | Payer: Self-pay | Attending: Emergency Medicine | Admitting: Emergency Medicine

## 2016-07-11 ENCOUNTER — Encounter: Payer: Self-pay | Admitting: Emergency Medicine

## 2016-07-11 DIAGNOSIS — R1110 Vomiting, unspecified: Secondary | ICD-10-CM

## 2016-07-11 DIAGNOSIS — D72829 Elevated white blood cell count, unspecified: Secondary | ICD-10-CM

## 2016-07-11 DIAGNOSIS — K3184 Gastroparesis: Secondary | ICD-10-CM

## 2016-07-11 LAB — COMPREHENSIVE METABOLIC PANEL
ALBUMIN: 4.8 g/dL (ref 3.5–5.0)
ALK PHOS: 87 U/L (ref 38–126)
ALT: 25 U/L (ref 14–54)
ANION GAP: 12 (ref 5–15)
AST: 28 U/L (ref 15–41)
BUN: 12 mg/dL (ref 6–20)
CALCIUM: 9.4 mg/dL (ref 8.9–10.3)
CHLORIDE: 102 mmol/L (ref 101–111)
CO2: 25 mmol/L (ref 22–32)
Creatinine, Ser: 0.71 mg/dL (ref 0.44–1.00)
GFR calc Af Amer: >60 mL/min (ref >60)
GFR calc non Af Amer: >60 mL/min (ref >60)
GLUCOSE: 132 mg/dL — AB (ref 65–99)
POTASSIUM: 3.7 mmol/L (ref 3.5–5.1)
SODIUM: 139 mmol/L (ref 135–145)
Total Bilirubin: 0.6 mg/dL (ref 0.3–1.2)
Total Protein: 9.1 g/dL — ABNORMAL HIGH (ref 6.5–8.1)

## 2016-07-11 LAB — URINALYSIS, COMPLETE (UACMP) WITH MICROSCOPIC
Bilirubin Urine: NEGATIVE
Glucose, UA: 50 mg/dL — AB
KETONES UR: 20 mg/dL — AB
Leukocytes, UA: NEGATIVE
Nitrite: NEGATIVE
PH: 5 (ref 5.0–8.0)
Protein, ur: >=300 mg/dL — AB
RBC / HPF: NONE SEEN RBC/hpf (ref 0–5)
Specific Gravity, Urine: 1.03 (ref 1.005–1.030)

## 2016-07-11 LAB — POCT PREGNANCY, URINE: PREG TEST UR: NEGATIVE

## 2016-07-11 LAB — LIPASE, BLOOD: Lipase: 23 U/L (ref 11–51)

## 2016-07-11 LAB — CBC
HCT: 45 % (ref 35.0–47.0)
Hemoglobin: 15.3 g/dL (ref 12.0–16.0)
MCH: 29.2 pg (ref 26.0–34.0)
MCHC: 34 g/dL (ref 32.0–36.0)
MCV: 86.1 fL (ref 80.0–100.0)
PLATELETS: 465 10*3/uL — AB (ref 150–440)
RBC: 5.23 MIL/uL — AB (ref 3.80–5.20)
RDW: 14.4 % (ref 11.5–14.5)
WBC: 23.2 10*3/uL — ABNORMAL HIGH (ref 3.6–11.0)

## 2016-07-11 MED ORDER — METOCLOPRAMIDE HCL 5 MG/ML IJ SOLN
10.0000 mg | Freq: Once | INTRAMUSCULAR | Status: AC
  Administered 2016-07-11: 10 mg via INTRAVENOUS

## 2016-07-11 MED ORDER — METOCLOPRAMIDE HCL 5 MG/ML IJ SOLN
INTRAMUSCULAR | Status: AC
  Administered 2016-07-11: 10 mg via INTRAVENOUS
  Filled 2016-07-11: qty 2

## 2016-07-11 MED ORDER — PANTOPRAZOLE SODIUM 40 MG IV SOLR
40.0000 mg | Freq: Once | INTRAVENOUS | Status: AC
  Administered 2016-07-11: 40 mg via INTRAVENOUS

## 2016-07-11 MED ORDER — ONDANSETRON HCL 4 MG/2ML IJ SOLN
4.0000 mg | Freq: Once | INTRAMUSCULAR | Status: AC
  Administered 2016-07-11: 4 mg via INTRAVENOUS

## 2016-07-11 MED ORDER — ONDANSETRON HCL 4 MG/2ML IJ SOLN
INTRAMUSCULAR | Status: AC
  Administered 2016-07-11: 4 mg via INTRAVENOUS
  Filled 2016-07-11: qty 2

## 2016-07-11 MED ORDER — SODIUM CHLORIDE 0.9 % IV BOLUS (SEPSIS)
1000.0000 mL | Freq: Once | INTRAVENOUS | Status: AC
  Administered 2016-07-11: 1000 mL via INTRAVENOUS

## 2016-07-11 MED ORDER — METOCLOPRAMIDE HCL 10 MG PO TABS: 10.0000 mg | ORAL_TABLET | Freq: Three times a day (TID) | ORAL | 1 refills | Status: DC

## 2016-07-11 MED ORDER — ONDANSETRON 4 MG PO TBDP: 4.0000 mg | ORAL_TABLET | Freq: Three times a day (TID) | ORAL | 0 refills | Status: DC | PRN

## 2016-07-11 MED ORDER — PANTOPRAZOLE SODIUM 40 MG PO TBEC: 40.0000 mg | DELAYED_RELEASE_TABLET | Freq: Every day | ORAL | 0 refills | Status: DC

## 2016-07-11 NOTE — ED Notes (Signed)
Lab reports green top hemolyzed; will initiate IV with blood redraw and place pt in subwait

## 2016-07-11 NOTE — ED Provider Notes (Signed)
[facility] Emergency Department Provider Note   First MD Initiated Contact with Patient 07/11/16 0201     (approximate)  I have reviewed the triage vital signs and the nursing notes.   HISTORY  Chief Complaint Abdominal Pain and Chest Pain   HPI Jasmine Mason is a 31 y.o. female with history of chronic abdominal pain and vomiting presents to the emergency department with return of mid abdominal discomfort accompanied by vomiting 3 days. Patient states she ran out of her Reglan and attributes her current symptoms to that. Patient denies any fever   Past Medical History:  Diagnosis Date  . Back pain   . Back pain   . GERD (gastroesophageal reflux disease)   . Mesenteric adenitis   . Obesity   . Tobacco abuse     Patient Active Problem List   Diagnosis Date Noted  . Nausea with vomiting   . Hiatal hernia   . Uncontrollable vomiting 03/10/2016  . Abdominal pain, epigastric   . GERD (gastroesophageal reflux disease) 01/24/2015  . Hypokalemia 01/24/2015  . Abdominal pain 01/24/2015  . Leukocytosis 01/24/2015  . Chest pain 01/24/2015  . Cough 01/24/2015  . Pain of left calf 01/24/2015  . Elevated troponin 01/24/2015  . Obesity   . Mesenteric adenitis   . Tobacco abuse     Past Surgical History:  Procedure Laterality Date  . CARPAL TUNNEL RELEASE Left ~ 2013  . ESOPHAGOGASTRODUODENOSCOPY N/A 01/25/2015   Procedure: ESOPHAGOGASTRODUODENOSCOPY (EGD);  Surgeon: Irene Shipper, MD;  Location: [facility] ENDOSCOPY;  Service: Endoscopy;  Laterality: N/A;  . ESOPHAGOGASTRODUODENOSCOPY (EGD) WITH PROPOFOL N/A 03/13/2016   Procedure: ESOPHAGOGASTRODUODENOSCOPY (EGD) WITH PROPOFOL;  Surgeon: Lucilla Lame, MD;  Location: [facility] ENDOSCOPY;  Service: Endoscopy;  Laterality: N/A;  . FRACTURE SURGERY    . ORIF ANKLE FRACTURE Left 1990's    Prior to Admission medications   Medication Sig Start Date End Date Taking? Authorizing Provider  aluminum-magnesium  hydroxide-simethicone (MAALOX) [id number] MG/5ML SUSP Take 30 mLs by mouth 4 (four) times daily -  before meals and at bedtime. Patient not taking: Reported on 06/12/2016 05/09/16   Carrie Mew, MD  amoxicillin (AMOXIL) 500 MG capsule Take 1 capsule (500 mg total) by mouth 3 (three) times daily. Patient not taking: Reported on 06/12/2016 04/27/16   Sable Feil, PA-C  famotidine (PEPCID) 20 MG tablet Take 1 tablet (20 mg total) by mouth 2 (two) times daily. Patient not taking: Reported on 06/12/2016 05/09/16   Carrie Mew, MD  HYDROcodone-acetaminophen (NORCO/VICODIN) 5-325 MG tablet Take 1-2 tablets by mouth every 4 (four) hours as needed. 06/19/16   Gloriann Loan, PA-C  ibuprofen (ADVIL,MOTRIN) 600 MG tablet Take 1 tablet (600 mg total) by mouth every 8 (eight) hours as needed. Patient not taking: Reported on 06/12/2016 04/27/16   Sable Feil, PA-C  lidocaine (LIDODERM) 5 % Place 1 patch onto the skin daily. 06/12/16   Daymon Larsen, MD  metoCLOPramide (REGLAN) 10 MG tablet Take 1 tablet (10 mg total) by mouth 3 (three) times daily with meals. 06/12/16 06/22/16  Daymon Larsen, MD  naproxen (NAPROSYN) 500 MG tablet Take 1 tablet (500 mg total) by mouth 2 (two) times daily. 06/19/16   Kayla Rose, PA-C  ondansetron (ZOFRAN) 4 MG tablet Take 1 tablet (4 mg total) by mouth every 8 (eight) hours as needed for nausea or vomiting. Patient not taking: Reported on 06/12/2016 03/13/16   Fritzi Mandes, MD  pantoprazole (PROTONIX) 40 MG tablet Take 1  tablet (40 mg total) by mouth daily. 06/07/16 06/07/17  Orbie Pyo, MD  traMADol (ULTRAM) 50 MG tablet Take 1 tablet (50 mg total) by mouth every 6 (six) hours as needed. Patient not taking: Reported on 06/12/2016 04/27/16 04/27/17  Sable Feil, PA-C    Allergies Patient has no known allergies.  Family History  Problem Relation Age of Onset  . Hypertension Mother   . Hypertension Father   . Diabetes Father   . Heart disease Father    . Hypertension Sister     Social History Social History  Substance Use Topics  . Smoking status: Current Every Day Smoker    Packs/day: 0.25    Years: 10.00    Types: Cigarettes  . Smokeless tobacco: Never Used  . Alcohol use No    Review of Systems Constitutional: No fever/chills Eyes: No visual changes. ENT: No sore throat. Cardiovascular: Denies chest pain. Respiratory: Denies shortness of breath. Gastrointestinal: Positive for abdominal pain and vomiting No diarrhea.  No constipation. Genitourinary: Negative for dysuria. Musculoskeletal: Negative for back pain. Skin: Negative for rash. Neurological: Negative for headaches, focal weakness or numbness.  10-point ROS otherwise negative.  ____________________________________________   PHYSICAL EXAM:  VITAL SIGNS: ED Triage Vitals  Enc Vitals Group     BP 07/11/16 0007 (!) 169/107     Pulse Rate 07/11/16 0007 94     Resp 07/11/16 0007 20     Temp 07/11/16 0007 98 F (36.7 C)     Temp Source 07/11/16 0007 Oral     SpO2 07/11/16 0007 98 %     Weight 07/11/16 0005 212 lb (96.2 kg)     Height 07/11/16 0005 5\' 7"  (1.702 m)     Head Circumference --      Peak Flow --      Pain Score 07/11/16 0005 10     Pain Loc --      Pain Edu? --      Excl. in [place]? --     Constitutional: Alert and oriented. Well appearing and in no acute distress. Eyes: Conjunctivae are normal. PERRL. EOMI. Head: Atraumatic. Mouth/Throat: Mucous membranes are moist.  Oropharynx non-erythematous. Neck: No stridor.   Cardiovascular: Normal rate, regular rhythm. Good peripheral circulation. Grossly normal heart sounds. Respiratory: Normal respiratory effort.  No retractions. Lungs CTAB. Gastrointestinal: Epigastric discomfort with palpation. No distention.   Musculoskeletal: No lower extremity tenderness nor edema. No gross deformities of extremities. Neurologic:  Normal speech and language. No gross focal neurologic deficits are appreciated.    Skin:  Skin is warm, dry and intact. No rash noted. Psychiatric: Mood and affect are normal. Speech and behavior are normal. ____________________________________________   LABS (all labs ordered are listed, but only abnormal results are displayed)  Labs Reviewed  CBC - Abnormal; Notable for the following:       Result Value   WBC 23.2 (*)    RBC 5.23 (*)    Platelets 465 (*)    All other components within normal limits  URINALYSIS, COMPLETE (UACMP) WITH MICROSCOPIC - Abnormal; Notable for the following:    Color, Urine YELLOW (*)    APPearance CLOUDY (*)    Glucose, UA 50 (*)    Hgb urine dipstick SMALL (*)    Ketones, ur 20 (*)    Protein, ur >=300 (*)    Bacteria, UA RARE (*)    Squamous Epithelial / LPF TOO NUMEROUS TO COUNT (*)    All other components within normal  limits  COMPREHENSIVE METABOLIC PANEL - Abnormal; Notable for the following:    Glucose, Bld 132 (*)    Total Protein 9.1 (*)    All other components within normal limits  LIPASE, BLOOD  POCT PREGNANCY, URINE      Procedures    INITIAL IMPRESSION / ASSESSMENT AND PLAN / ED COURSE  Pertinent labs & imaging results that were available during my care of the patient were reviewed by me and considered in my medical decision making (see chart for details).  Spoke with the patient at length again regarding her persistent leukocytosis and the necessity of her following up with hematology oncology. Patient states that she had a bone biopsy schedule however she did not go to her appointment. Patient will be referred to Dr. Grayland Ormond hematologist oncologist for further outpatient evaluation for persistent leukocytosis. Patient's nausea vomiting and abdominal discomfort completely resolved following Protonix Reglan and Zofran. Patient be prescribed same for home. Given the fact that the patient states that this current episode is consistent with multiple previous episodes of the same no additional imaging performed.  Patient had a CT scan of the abdomen pelvis performed in November.   Clinical Course     ____________________________________________  FINAL CLINICAL IMPRESSION(S) / ED DIAGNOSES  Final diagnoses:  Chronic vomiting  Gastroparesis  Leukocytosis, unspecified type     MEDICATIONS GIVEN DURING THIS VISIT:  Medications  sodium chloride 0.9 % bolus 1,000 mL (not administered)  pantoprazole (PROTONIX) injection 40 mg (40 mg Intravenous Given 07/11/16 0314)  ondansetron (ZOFRAN) injection 4 mg (4 mg Intravenous Given 07/11/16 0314)  metoCLOPramide (REGLAN) injection 10 mg (10 mg Intravenous Given 07/11/16 0314)     NEW OUTPATIENT MEDICATIONS STARTED DURING THIS VISIT:  New Prescriptions   No medications on file    Modified Medications   No medications on file    Discontinued Medications   No medications on file     Note:  This document was prepared using Dragon voice recognition software and may include unintentional dictation errors.    Gregor Hams, MD 07/11/16 [id number]

## 2016-07-11 NOTE — ED Triage Notes (Addendum)
Pt to triage via w/c with no distress noted, brought in by EMS; pt reports last couple days having mid abd pain accomp by N/V and burning to chest

## 2016-07-11 NOTE — ED Notes (Signed)
ED Provider at bedside. 

## 2016-07-11 NOTE — ED Notes (Signed)
Pt states hx of gastritis and reports she takes Protonix at home however she ran out "about a month ago". Pt also takes other gastric medications however at this time is unable to state what the medication names are.   Pt also reports her BP has been elevated for "a couple months" and states she has not been diagnosed with hypertension. Pt states she is not on any BP meds at this time.

## 2016-08-05 NOTE — Progress Notes (Deleted)
[place]  Telephone:([phone] Fax:(336) [phone]  ID: [name] OB: 1986/06/09  MR#: [id number]  [id number]  Patient Care Team: Jules Schick, NP as PCP - General (Nurse Practitioner)  CHIEF COMPLAINT: Leukocytosis.  INTERVAL HISTORY: ***  REVIEW OF SYSTEMS:   ROS  As per HPI. Otherwise, a complete review of systems is negative.  PAST MEDICAL HISTORY: Past Medical History:  Diagnosis Date  . Back pain   . Back pain   . GERD (gastroesophageal reflux disease)   . Mesenteric adenitis   . Obesity   . Tobacco abuse     PAST SURGICAL HISTORY: Past Surgical History:  Procedure Laterality Date  . CARPAL TUNNEL RELEASE Left ~ 2013  . ESOPHAGOGASTRODUODENOSCOPY N/A 01/25/2015   Procedure: ESOPHAGOGASTRODUODENOSCOPY (EGD);  Surgeon: Irene Shipper, MD;  Location: [facility] ENDOSCOPY;  Service: Endoscopy;  Laterality: N/A;  . ESOPHAGOGASTRODUODENOSCOPY (EGD) WITH PROPOFOL N/A 03/13/2016   Procedure: ESOPHAGOGASTRODUODENOSCOPY (EGD) WITH PROPOFOL;  Surgeon: Lucilla Lame, MD;  Location: [facility] ENDOSCOPY;  Service: Endoscopy;  Laterality: N/A;  . FRACTURE SURGERY    . ORIF ANKLE FRACTURE Left 1990's    FAMILY HISTORY: Family History  Problem Relation Age of Onset  . Hypertension Mother   . Hypertension Father   . Diabetes Father   . Heart disease Father   . Hypertension Sister     ADVANCED DIRECTIVES (Y/N):  N  HEALTH MAINTENANCE: Social History  Substance Use Topics  . Smoking status: Current Every Day Smoker    Packs/day: 0.25    Years: 10.00    Types: Cigarettes  . Smokeless tobacco: Never Used  . Alcohol use No     Colonoscopy:  PAP:  Bone density:  Lipid panel:  No Known Allergies  Current Outpatient Prescriptions  Medication Sig Dispense Refill  . aluminum-magnesium hydroxide-simethicone (MAALOX) [id number] MG/5ML SUSP Take 30 mLs by mouth 4 (four) times daily -  before meals and at bedtime. (Patient not taking: Reported on  06/12/2016) 355 mL 0  . amoxicillin (AMOXIL) 500 MG capsule Take 1 capsule (500 mg total) by mouth 3 (three) times daily. (Patient not taking: Reported on 06/12/2016) 30 capsule 0  . famotidine (PEPCID) 20 MG tablet Take 1 tablet (20 mg total) by mouth 2 (two) times daily. (Patient not taking: Reported on 06/12/2016) 60 tablet 0  . HYDROcodone-acetaminophen (NORCO/VICODIN) 5-325 MG tablet Take 1-2 tablets by mouth every 4 (four) hours as needed. 10 tablet 0  . ibuprofen (ADVIL,MOTRIN) 600 MG tablet Take 1 tablet (600 mg total) by mouth every 8 (eight) hours as needed. (Patient not taking: Reported on 06/12/2016) 15 tablet 0  . lidocaine (LIDODERM) 5 % Place 1 patch onto the skin daily. 15 patch 0  . metoCLOPramide (REGLAN) 10 MG tablet Take 1 tablet (10 mg total) by mouth 3 (three) times daily with meals. 30 tablet 0  . metoCLOPramide (REGLAN) 10 MG tablet Take 1 tablet (10 mg total) by mouth 3 (three) times daily with meals. 90 tablet 1  . naproxen (NAPROSYN) 500 MG tablet Take 1 tablet (500 mg total) by mouth 2 (two) times daily. 30 tablet 0  . ondansetron (ZOFRAN ODT) 4 MG disintegrating tablet Take 1 tablet (4 mg total) by mouth every 8 (eight) hours as needed for nausea or vomiting. 20 tablet 0  . ondansetron (ZOFRAN) 4 MG tablet Take 1 tablet (4 mg total) by mouth every 8 (eight) hours as needed for nausea or vomiting. (Patient not taking: Reported on 06/12/2016) 20 tablet 0  .  pantoprazole (PROTONIX) 40 MG tablet Take 1 tablet (40 mg total) by mouth daily. 30 tablet 0  . pantoprazole (PROTONIX) 40 MG tablet Take 1 tablet (40 mg total) by mouth daily. 30 tablet 0  . traMADol (ULTRAM) 50 MG tablet Take 1 tablet (50 mg total) by mouth every 6 (six) hours as needed. (Patient not taking: Reported on 06/12/2016) 20 tablet 0   No current facility-administered medications for this visit.     OBJECTIVE: There were no vitals filed for this visit.   There is no height or weight on file to calculate  BMI.    ECOG FS:{CHL ONC [id number]  General: Well-developed, well-nourished, no acute distress. Eyes: Pink conjunctiva, anicteric sclera. HEENT: Normocephalic, moist mucous membranes, clear oropharnyx. Lungs: Clear to auscultation bilaterally. Heart: Regular rate and rhythm. No rubs, murmurs, or gallops. Abdomen: Soft, nontender, nondistended. No organomegaly noted, normoactive bowel sounds. Musculoskeletal: No edema, cyanosis, or clubbing. Neuro: Alert, answering all questions appropriately. Cranial nerves grossly intact. Skin: No rashes or petechiae noted. Psych: Normal affect. Lymphatics: No cervical, calvicular, axillary or inguinal LAD.   LAB RESULTS:  Lab Results  Component Value Date   NA 139 07/11/2016   K 3.7 07/11/2016   CL 102 07/11/2016   CO2 25 07/11/2016   GLUCOSE 132 (H) 07/11/2016   BUN 12 07/11/2016   CREATININE 0.71 07/11/2016   CALCIUM 9.4 07/11/2016   PROT 9.1 (H) 07/11/2016   ALBUMIN 4.8 07/11/2016   AST 28 07/11/2016   ALT 25 07/11/2016   ALKPHOS 87 07/11/2016   BILITOT 0.6 07/11/2016   GFRNONAA >60 07/11/2016   GFRAA >60 07/11/2016    Lab Results  Component Value Date   WBC 23.2 (H) 07/11/2016   NEUTROABS 14.8 (H) 06/13/2016   HGB 15.3 07/11/2016   HCT 45.0 07/11/2016   MCV 86.1 07/11/2016   PLT 465 (H) 07/11/2016     STUDIES: No results found.  ASSESSMENT: Leukocytosis  PLAN:    1. Leukocytosis:  Patient expressed understanding and was in agreement with this plan. She also understands that She can call clinic at any time with any questions, concerns, or complaints.   No matching staging information was found for the patient.  Lloyd Huger, MD   08/05/2016 10:12 PM

## 2016-08-06 ENCOUNTER — Inpatient Hospital Stay: Payer: Self-pay | Admitting: Oncology

## 2016-08-27 NOTE — Progress Notes (Signed)
[place]  Telephone:(336) [phone] Fax:(336) [phone]  ID: [name] OB: 03-22-1986  MR#: [id number]  [id number]  Patient Care Team: Jules Schick, NP as PCP - General (Nurse Practitioner)  CHIEF COMPLAINT: Leukocytosis.  INTERVAL HISTORY: Patient is a 31 year old female who is undergone extensive workup for persistent abdominal pain. She was also noted to have a persistently elevated white blood cell count. Currently she feels well and is asymptomatic. She does not have abdominal pain today. She is no neurologic complaints. She denies any recent fevers. She is a good appetite and denies weight loss. She has no chest pain or shortness of breath. She denies any nausea, vomiting, constipation, or diarrhea. She has no melena or hematochezia. She has no urinary complaints. Patient otherwise feels well and offers no further specific complaints today.  REVIEW OF SYSTEMS:   Review of Systems  Constitutional: Negative.  Negative for fever and malaise/fatigue.  Respiratory: Negative.  Negative for cough and shortness of breath.   Cardiovascular: Negative.  Negative for chest pain and leg swelling.  Gastrointestinal: Positive for abdominal pain. Negative for blood in stool, constipation, diarrhea, melena, nausea and vomiting.  Genitourinary: Negative.   Musculoskeletal: Negative.   Neurological: Negative.  Negative for weakness.  Psychiatric/Behavioral: Negative.  The patient is not nervous/anxious.     As per HPI. Otherwise, a complete review of systems is negative.  PAST MEDICAL HISTORY: Past Medical History:  Diagnosis Date  . Back pain   . Back pain   . GERD (gastroesophageal reflux disease)   . Mesenteric adenitis   . Obesity   . Tobacco abuse     PAST SURGICAL HISTORY: Past Surgical History:  Procedure Laterality Date  . CARPAL TUNNEL RELEASE Left ~ 2013  . ESOPHAGOGASTRODUODENOSCOPY N/A 01/25/2015   Procedure: ESOPHAGOGASTRODUODENOSCOPY (EGD);   Surgeon: Irene Shipper, MD;  Location: [facility] ENDOSCOPY;  Service: Endoscopy;  Laterality: N/A;  . ESOPHAGOGASTRODUODENOSCOPY (EGD) WITH PROPOFOL N/A 03/13/2016   Procedure: ESOPHAGOGASTRODUODENOSCOPY (EGD) WITH PROPOFOL;  Surgeon: Lucilla Lame, MD;  Location: [facility] ENDOSCOPY;  Service: Endoscopy;  Laterality: N/A;  . FRACTURE SURGERY    . ORIF ANKLE FRACTURE Left 1990's    FAMILY HISTORY: Family History  Problem Relation Age of Onset  . Hypertension Mother   . Hypertension Father   . Diabetes Father   . Heart disease Father   . Hypertension Sister     ADVANCED DIRECTIVES (Y/N):  N  HEALTH MAINTENANCE: Social History  Substance Use Topics  . Smoking status: Current Every Day Smoker    Packs/day: 0.25    Years: 10.00    Types: Cigarettes  . Smokeless tobacco: Never Used  . Alcohol use No     Colonoscopy:  PAP:  Bone density:  Lipid panel:  No Known Allergies  Current Outpatient Prescriptions  Medication Sig Dispense Refill  . pantoprazole (PROTONIX) 40 MG tablet Take 1 tablet (40 mg total) by mouth daily. 30 tablet 0  . aluminum-magnesium hydroxide-simethicone (MAALOX) [id number] MG/5ML SUSP Take 30 mLs by mouth 4 (four) times daily -  before meals and at bedtime. (Patient not taking: Reported on 06/12/2016) 355 mL 0  . amoxicillin (AMOXIL) 500 MG capsule Take 1 capsule (500 mg total) by mouth 3 (three) times daily. (Patient not taking: Reported on 06/12/2016) 30 capsule 0  . famotidine (PEPCID) 20 MG tablet Take 1 tablet (20 mg total) by mouth 2 (two) times daily. (Patient not taking: Reported on 06/12/2016) 60 tablet 0  . HYDROcodone-acetaminophen (NORCO/VICODIN) 5-325 MG tablet  Take 1-2 tablets by mouth every 4 (four) hours as needed. (Patient not taking: Reported on 08/28/2016) 10 tablet 0  . ibuprofen (ADVIL,MOTRIN) 600 MG tablet Take 1 tablet (600 mg total) by mouth every 8 (eight) hours as needed. (Patient not taking: Reported on 06/12/2016) 15 tablet 0  . lidocaine  (LIDODERM) 5 % Place 1 patch onto the skin daily. (Patient not taking: Reported on 08/28/2016) 15 patch 0  . metoCLOPramide (REGLAN) 10 MG tablet Take 1 tablet (10 mg total) by mouth 3 (three) times daily with meals. 30 tablet 0  . metoCLOPramide (REGLAN) 10 MG tablet Take 1 tablet (10 mg total) by mouth 3 (three) times daily with meals. (Patient not taking: Reported on 08/28/2016) 90 tablet 1  . naproxen (NAPROSYN) 500 MG tablet Take 1 tablet (500 mg total) by mouth 2 (two) times daily. (Patient not taking: Reported on 08/28/2016) 30 tablet 0  . ondansetron (ZOFRAN ODT) 4 MG disintegrating tablet Take 1 tablet (4 mg total) by mouth every 8 (eight) hours as needed for nausea or vomiting. (Patient not taking: Reported on 08/28/2016) 20 tablet 0  . ondansetron (ZOFRAN) 4 MG tablet Take 1 tablet (4 mg total) by mouth every 8 (eight) hours as needed for nausea or vomiting. (Patient not taking: Reported on 06/12/2016) 20 tablet 0  . pantoprazole (PROTONIX) 40 MG tablet Take 1 tablet (40 mg total) by mouth daily. (Patient not taking: Reported on 08/28/2016) 30 tablet 0  . traMADol (ULTRAM) 50 MG tablet Take 1 tablet (50 mg total) by mouth every 6 (six) hours as needed. (Patient not taking: Reported on 06/12/2016) 20 tablet 0   No current facility-administered medications for this visit.     OBJECTIVE: Vitals:   08/28/16 1151  BP: (!) 146/96  Pulse: 89  Resp: 18  Temp: 98.2 F (36.8 C)     Body mass index is 35.76 kg/m.    ECOG FS:0 - Asymptomatic  General: Well-developed, well-nourished, no acute distress. Eyes: Pink conjunctiva, anicteric sclera. HEENT: Normocephalic, moist mucous membranes, clear oropharnyx. Lungs: Clear to auscultation bilaterally. Heart: Regular rate and rhythm. No rubs, murmurs, or gallops. Abdomen: Soft, nontender, nondistended. No organomegaly noted, normoactive bowel sounds. Musculoskeletal: No edema, cyanosis, or clubbing. Neuro: Alert, answering all questions appropriately.  Cranial nerves grossly intact. Skin: No rashes or petechiae noted. Psych: Normal affect. Lymphatics: No cervical, calvicular, axillary or inguinal LAD.   LAB RESULTS:  Lab Results  Component Value Date   NA 139 07/11/2016   K 3.7 07/11/2016   CL 102 07/11/2016   CO2 25 07/11/2016   GLUCOSE 132 (H) 07/11/2016   BUN 12 07/11/2016   CREATININE 0.71 07/11/2016   CALCIUM 9.4 07/11/2016   PROT 9.1 (H) 07/11/2016   ALBUMIN 4.8 07/11/2016   AST 28 07/11/2016   ALT 25 07/11/2016   ALKPHOS 87 07/11/2016   BILITOT 0.6 07/11/2016   GFRNONAA >60 07/11/2016   GFRAA >60 07/11/2016    Lab Results  Component Value Date   WBC 10.8 08/28/2016   NEUTROABS 7.8 (H) 08/28/2016   HGB 15.1 08/28/2016   HCT 43.8 08/28/2016   MCV 86.7 08/28/2016   PLT 360 08/28/2016     STUDIES: No results found.  ASSESSMENT: Leukocytosis  PLAN:    1. Leukocytosis:Patient's white blood cell count is within normal limits today. Her previously elevations are likely reactive secondary to ongoing abdominal pain. All of her other laboratory work is also either negative or within normal limits. Peripheral blood flow cytometry and  BCR-ABL are pending at time of dictation. Return to clinic in 1 month with repeat laboratory work and further evaluation. If the remainder of her laboratory work is negative and her white blood cell count remains within normal limits, patient can likely be discharged from clinic.  2. Abdominal pain: Unclear etiology. Patient has had extensive workup in the past without it stings cause determined.   Patient expressed understanding and was in agreement with this plan. She also understands that She can call clinic at any time with any questions, concerns, or complaints.   Cancer Staging No matching staging information was found for the patient.  Lloyd Huger, MD   09/02/2016 7:33 AM

## 2016-08-28 ENCOUNTER — Encounter (INDEPENDENT_AMBULATORY_CARE_PROVIDER_SITE_OTHER): Payer: Self-pay

## 2016-08-28 ENCOUNTER — Inpatient Hospital Stay: Payer: Self-pay | Attending: Oncology | Admitting: Oncology

## 2016-08-28 ENCOUNTER — Encounter: Payer: Self-pay | Admitting: Oncology

## 2016-08-28 ENCOUNTER — Inpatient Hospital Stay: Payer: Self-pay

## 2016-08-28 VITALS — BP 146/96 | HR 89 | Temp 98.2°F | Resp 18 | Wt 228.3 lb

## 2016-08-28 DIAGNOSIS — D72829 Elevated white blood cell count, unspecified: Secondary | ICD-10-CM

## 2016-08-28 DIAGNOSIS — E669 Obesity, unspecified: Secondary | ICD-10-CM | POA: Insufficient documentation

## 2016-08-28 DIAGNOSIS — R109 Unspecified abdominal pain: Secondary | ICD-10-CM | POA: Insufficient documentation

## 2016-08-28 DIAGNOSIS — F17210 Nicotine dependence, cigarettes, uncomplicated: Secondary | ICD-10-CM | POA: Insufficient documentation

## 2016-08-28 DIAGNOSIS — K219 Gastro-esophageal reflux disease without esophagitis: Secondary | ICD-10-CM | POA: Insufficient documentation

## 2016-08-28 DIAGNOSIS — Z79899 Other long term (current) drug therapy: Secondary | ICD-10-CM | POA: Insufficient documentation

## 2016-08-28 LAB — CBC WITH DIFFERENTIAL/PLATELET
BASOS PCT: 1 %
Basophils Absolute: 0.1 10*3/uL (ref 0–0.1)
EOS ABS: 0.1 10*3/uL (ref 0–0.7)
Eosinophils Relative: 1 %
HCT: 43.8 % (ref 35.0–47.0)
HEMOGLOBIN: 15.1 g/dL (ref 12.0–16.0)
Lymphocytes Relative: 22 %
Lymphs Abs: 2.4 10*3/uL (ref 1.0–3.6)
MCH: 29.9 pg (ref 26.0–34.0)
MCHC: 34.4 g/dL (ref 32.0–36.0)
MCV: 86.7 fL (ref 80.0–100.0)
Monocytes Absolute: 0.5 10*3/uL (ref 0.2–0.9)
Monocytes Relative: 5 %
NEUTROS PCT: 71 %
Neutro Abs: 7.8 10*3/uL — ABNORMAL HIGH (ref 1.4–6.5)
PLATELETS: 360 10*3/uL (ref 150–440)
RBC: 5.06 MIL/uL (ref 3.80–5.20)
RDW: 13.6 % (ref 11.5–14.5)
WBC: 10.8 10*3/uL (ref 3.6–11.0)

## 2016-08-28 LAB — FOLATE: Folate: 20.1 ng/mL (ref >5.9)

## 2016-08-28 LAB — IRON AND TIBC
IRON: 55 ug/dL (ref 28–170)
SATURATION RATIOS: 15 % (ref 10.4–31.8)
TIBC: 373 ug/dL (ref 250–450)
UIBC: 318 ug/dL

## 2016-08-28 LAB — FERRITIN: Ferritin: 55 ng/mL (ref 11–307)

## 2016-08-28 LAB — LACTATE DEHYDROGENASE: LDH: 140 U/L (ref 98–192)

## 2016-08-28 LAB — VITAMIN B12: Vitamin B-12: 224 pg/mL (ref 180–914)

## 2016-08-28 NOTE — Progress Notes (Signed)
Patient is here for new patient appointment.  

## 2016-09-04 LAB — BCR-ABL1, CML/ALL, PCR, QUANT

## 2016-09-04 LAB — COMP PANEL: LEUKEMIA/LYMPHOMA

## 2016-09-20 ENCOUNTER — Observation Stay
Admission: EM | Admit: 2016-09-20 | Discharge: 2016-09-21 | Disposition: A | Discharge status: Home / Self Care | Payer: Self-pay | Attending: Internal Medicine | Admitting: Internal Medicine

## 2016-09-20 ENCOUNTER — Emergency Department: Payer: Self-pay

## 2016-09-20 DIAGNOSIS — R1010 Upper abdominal pain, unspecified: Secondary | ICD-10-CM

## 2016-09-20 DIAGNOSIS — D72829 Elevated white blood cell count, unspecified: Secondary | ICD-10-CM | POA: Insufficient documentation

## 2016-09-20 DIAGNOSIS — Z6835 Body mass index (BMI) 35.0-35.9, adult: Secondary | ICD-10-CM | POA: Insufficient documentation

## 2016-09-20 DIAGNOSIS — R17 Unspecified jaundice: Secondary | ICD-10-CM | POA: Insufficient documentation

## 2016-09-20 DIAGNOSIS — I10 Essential (primary) hypertension: Secondary | ICD-10-CM | POA: Insufficient documentation

## 2016-09-20 DIAGNOSIS — R5381 Other malaise: Secondary | ICD-10-CM | POA: Insufficient documentation

## 2016-09-20 DIAGNOSIS — Z8249 Family history of ischemic heart disease and other diseases of the circulatory system: Secondary | ICD-10-CM | POA: Insufficient documentation

## 2016-09-20 DIAGNOSIS — Z833 Family history of diabetes mellitus: Secondary | ICD-10-CM | POA: Insufficient documentation

## 2016-09-20 DIAGNOSIS — K2900 Acute gastritis without bleeding: Principal | ICD-10-CM | POA: Diagnosis present

## 2016-09-20 DIAGNOSIS — E669 Obesity, unspecified: Secondary | ICD-10-CM | POA: Insufficient documentation

## 2016-09-20 DIAGNOSIS — K219 Gastro-esophageal reflux disease without esophagitis: Secondary | ICD-10-CM | POA: Insufficient documentation

## 2016-09-20 DIAGNOSIS — Q51810 Arcuate uterus: Secondary | ICD-10-CM | POA: Insufficient documentation

## 2016-09-20 DIAGNOSIS — M549 Dorsalgia, unspecified: Secondary | ICD-10-CM | POA: Insufficient documentation

## 2016-09-20 DIAGNOSIS — K920 Hematemesis: Secondary | ICD-10-CM | POA: Insufficient documentation

## 2016-09-20 DIAGNOSIS — K449 Diaphragmatic hernia without obstruction or gangrene: Secondary | ICD-10-CM | POA: Insufficient documentation

## 2016-09-20 DIAGNOSIS — R531 Weakness: Secondary | ICD-10-CM | POA: Insufficient documentation

## 2016-09-20 DIAGNOSIS — R5383 Other fatigue: Secondary | ICD-10-CM | POA: Insufficient documentation

## 2016-09-20 DIAGNOSIS — F17210 Nicotine dependence, cigarettes, uncomplicated: Secondary | ICD-10-CM | POA: Insufficient documentation

## 2016-09-20 DIAGNOSIS — E876 Hypokalemia: Secondary | ICD-10-CM | POA: Insufficient documentation

## 2016-09-20 DIAGNOSIS — I880 Nonspecific mesenteric lymphadenitis: Secondary | ICD-10-CM | POA: Insufficient documentation

## 2016-09-20 DIAGNOSIS — R42 Dizziness and giddiness: Secondary | ICD-10-CM | POA: Insufficient documentation

## 2016-09-20 HISTORY — DX: Essential (primary) hypertension: I10

## 2016-09-20 LAB — CBC
HCT: 44.2 % (ref 35.0–47.0)
Hemoglobin: 14.7 g/dL (ref 12.0–16.0)
MCH: 29.1 pg (ref 26.0–34.0)
MCHC: 33.2 g/dL (ref 32.0–36.0)
MCV: 87.6 fL (ref 80.0–100.0)
PLATELETS: 391 10*3/uL (ref 150–440)
RBC: 5.05 MIL/uL (ref 3.80–5.20)
RDW: 13.8 % (ref 11.5–14.5)
WBC: 22.7 10*3/uL — ABNORMAL HIGH (ref 3.6–11.0)

## 2016-09-20 LAB — COMPREHENSIVE METABOLIC PANEL
ALBUMIN: 4.7 g/dL (ref 3.5–5.0)
ALT: 21 U/L (ref 14–54)
AST: 29 U/L (ref 15–41)
Alkaline Phosphatase: 91 U/L (ref 38–126)
Anion gap: 10 (ref 5–15)
BUN: 10 mg/dL (ref 6–20)
CALCIUM: 9.4 mg/dL (ref 8.9–10.3)
CHLORIDE: 106 mmol/L (ref 101–111)
CO2: 22 mmol/L (ref 22–32)
CREATININE: 0.58 mg/dL (ref 0.44–1.00)
GFR calc non Af Amer: >60 mL/min (ref >60)
GLUCOSE: 145 mg/dL — AB (ref 65–99)
Potassium: 4.1 mmol/L (ref 3.5–5.1)
SODIUM: 138 mmol/L (ref 135–145)
Total Bilirubin: 1.6 mg/dL — ABNORMAL HIGH (ref 0.3–1.2)
Total Protein: 8.5 g/dL — ABNORMAL HIGH (ref 6.5–8.1)

## 2016-09-20 LAB — URINALYSIS, COMPLETE (UACMP) WITH MICROSCOPIC
Bilirubin Urine: NEGATIVE
Glucose, UA: 50 mg/dL — AB
Ketones, ur: 20 mg/dL — AB
Leukocytes, UA: NEGATIVE
Nitrite: NEGATIVE
PH: 5 (ref 5.0–8.0)
Specific Gravity, Urine: 1.032 — ABNORMAL HIGH (ref 1.005–1.030)
WBC UA: NONE SEEN WBC/hpf (ref 0–5)

## 2016-09-20 LAB — TYPE AND SCREEN
ABO/RH(D): O POS
ANTIBODY SCREEN: NEGATIVE

## 2016-09-20 LAB — HEMOGLOBIN: Hemoglobin: 14.4 g/dL (ref 12.0–16.0)

## 2016-09-20 LAB — MAGNESIUM: MAGNESIUM: 1.6 mg/dL — AB (ref 1.7–2.4)

## 2016-09-20 LAB — LIPASE, BLOOD: LIPASE: 27 U/L (ref 11–51)

## 2016-09-20 LAB — POCT PREGNANCY, URINE: PREG TEST UR: NEGATIVE

## 2016-09-20 MED ORDER — ONDANSETRON HCL 4 MG PO TABS: 4.0000 mg | ORAL_TABLET | Freq: Four times a day (QID) | ORAL | Status: DC | PRN

## 2016-09-20 MED ORDER — IOPAMIDOL (ISOVUE-300) INJECTION 61%
30.0000 mL | Freq: Once | INTRAVENOUS | Status: AC | PRN
  Administered 2016-09-20: 30 mL via ORAL

## 2016-09-20 MED ORDER — ONDANSETRON HCL 4 MG/2ML IJ SOLN
4.0000 mg | Freq: Once | INTRAMUSCULAR | Status: AC
  Administered 2016-09-20: 4 mg via INTRAVENOUS
  Filled 2016-09-20: qty 2

## 2016-09-20 MED ORDER — IOPAMIDOL (ISOVUE-300) INJECTION 61%
100.0000 mL | Freq: Once | INTRAVENOUS | Status: AC | PRN
  Administered 2016-09-20: 100 mL via INTRAVENOUS

## 2016-09-20 MED ORDER — ALBUTEROL SULFATE (2.5 MG/3ML) 0.083% IN NEBU: 2.5000 mg | INHALATION_SOLUTION | RESPIRATORY_TRACT | Status: DC | PRN

## 2016-09-20 MED ORDER — ONDANSETRON HCL 4 MG/2ML IJ SOLN: 4.0000 mg | Freq: Four times a day (QID) | INTRAMUSCULAR | Status: DC | PRN

## 2016-09-20 MED ORDER — ENOXAPARIN SODIUM 40 MG/0.4ML ~~LOC~~ SOLN
40.0000 mg | SUBCUTANEOUS | Status: DC
  Administered 2016-09-20: 40 mg via SUBCUTANEOUS
  Filled 2016-09-20: qty 0.4

## 2016-09-20 MED ORDER — PANTOPRAZOLE SODIUM 40 MG PO TBEC
40.0000 mg | DELAYED_RELEASE_TABLET | Freq: Two times a day (BID) | ORAL | Status: DC
  Administered 2016-09-21: 40 mg via ORAL
  Filled 2016-09-20: qty 1

## 2016-09-20 MED ORDER — PANTOPRAZOLE SODIUM 40 MG IV SOLR
40.0000 mg | Freq: Once | INTRAVENOUS | Status: AC
  Administered 2016-09-20: 40 mg via INTRAVENOUS
  Filled 2016-09-20: qty 40

## 2016-09-20 MED ORDER — PANTOPRAZOLE SODIUM 40 MG PO TBEC: 40.0000 mg | DELAYED_RELEASE_TABLET | Freq: Every day | ORAL | Status: DC

## 2016-09-20 MED ORDER — KETOROLAC TROMETHAMINE 30 MG/ML IJ SOLN: 30.0000 mg | Freq: Four times a day (QID) | INTRAMUSCULAR | Status: DC | PRN

## 2016-09-20 MED ORDER — PNEUMOCOCCAL VAC POLYVALENT 25 MCG/0.5ML IJ INJ
0.5000 mL | INJECTION | INTRAMUSCULAR | Status: AC
  Administered 2016-09-21: 0.5 mL via INTRAMUSCULAR
  Filled 2016-09-20: qty 0.5

## 2016-09-20 MED ORDER — HYDROCODONE-ACETAMINOPHEN 5-325 MG PO TABS
1.0000 | ORAL_TABLET | ORAL | Status: DC | PRN
  Administered 2016-09-20 – 2016-09-21 (×2): 2 via ORAL
  Filled 2016-09-20 (×2): qty 2

## 2016-09-20 MED ORDER — HYDRALAZINE HCL 20 MG/ML IJ SOLN: 10.0000 mg | Freq: Four times a day (QID) | INTRAMUSCULAR | Status: DC | PRN

## 2016-09-20 MED ORDER — SODIUM CHLORIDE 0.9 % IV SOLN
Freq: Once | INTRAVENOUS | Status: AC
  Administered 2016-09-20: 14:00:00 via INTRAVENOUS

## 2016-09-20 MED ORDER — INFLUENZA VAC SPLIT QUAD 0.5 ML IM SUSY
0.5000 mL | PREFILLED_SYRINGE | INTRAMUSCULAR | Status: AC
  Administered 2016-09-21: 0.5 mL via INTRAMUSCULAR
  Filled 2016-09-20: qty 0.5

## 2016-09-20 MED ORDER — NICOTINE 14 MG/24HR TD PT24
14.0000 mg | MEDICATED_PATCH | Freq: Every day | TRANSDERMAL | Status: DC
  Administered 2016-09-20 – 2016-09-21 (×2): 14 mg via TRANSDERMAL
  Filled 2016-09-20 (×2): qty 1

## 2016-09-20 MED ORDER — ACETAMINOPHEN 325 MG PO TABS: 650.0000 mg | ORAL_TABLET | Freq: Four times a day (QID) | ORAL | Status: DC | PRN

## 2016-09-20 MED ORDER — MORPHINE SULFATE (PF) 4 MG/ML IV SOLN
4.0000 mg | Freq: Once | INTRAVENOUS | Status: AC
  Administered 2016-09-20: 4 mg via INTRAVENOUS
  Filled 2016-09-20: qty 1

## 2016-09-20 MED ORDER — ACETAMINOPHEN 650 MG RE SUPP: 650.0000 mg | Freq: Four times a day (QID) | RECTAL | Status: DC | PRN

## 2016-09-20 NOTE — ED Provider Notes (Signed)
[facility] Emergency Department Provider Note   ____________________________________________   First MD Initiated Contact with Patient 09/20/16 1234     (approximate)  I have reviewed the triage vital signs and the nursing notes.   HISTORY  Chief Complaint Abdominal Pain    HPI Jasmine Mason is a 31 y.o. female patient complains of upper abdominal pain starting yesterday with nausea vomiting diarrhea. Patient reports she is vomiting up clots of blood. She feels weak and lightheaded as well. She is not running a fever at least doesn't think she has. Pain is moderate at least.   Past Medical History:  Diagnosis Date  . Back pain   . Back pain   . GERD (gastroesophageal reflux disease)   . Mesenteric adenitis   . Obesity   . Tobacco abuse     Patient Active Problem List   Diagnosis Date Noted  . Nausea with vomiting   . Hiatal hernia   . Uncontrollable vomiting 03/10/2016  . Abdominal pain, epigastric   . GERD (gastroesophageal reflux disease) 01/24/2015  . Hypokalemia 01/24/2015  . Abdominal pain 01/24/2015  . Leukocytosis 01/24/2015  . Chest pain 01/24/2015  . Cough 01/24/2015  . Pain of left calf 01/24/2015  . Elevated troponin 01/24/2015  . Obesity   . Mesenteric adenitis   . Tobacco abuse     Past Surgical History:  Procedure Laterality Date  . CARPAL TUNNEL RELEASE Left ~ 2013  . ESOPHAGOGASTRODUODENOSCOPY N/A 01/25/2015   Procedure: ESOPHAGOGASTRODUODENOSCOPY (EGD);  Surgeon: Irene Shipper, MD;  Location: [facility] ENDOSCOPY;  Service: Endoscopy;  Laterality: N/A;  . ESOPHAGOGASTRODUODENOSCOPY (EGD) WITH PROPOFOL N/A 03/13/2016   Procedure: ESOPHAGOGASTRODUODENOSCOPY (EGD) WITH PROPOFOL;  Surgeon: Lucilla Lame, MD;  Location: [facility] ENDOSCOPY;  Service: Endoscopy;  Laterality: N/A;  . FRACTURE SURGERY    . ORIF ANKLE FRACTURE Left 1990's    Prior to Admission medications   Medication Sig Start Date End Date Taking? Authorizing Provider   aluminum-magnesium hydroxide-simethicone (MAALOX) [id number] MG/5ML SUSP Take 30 mLs by mouth 4 (four) times daily -  before meals and at bedtime. Patient not taking: Reported on 06/12/2016 05/09/16   Carrie Mew, MD  amoxicillin (AMOXIL) 500 MG capsule Take 1 capsule (500 mg total) by mouth 3 (three) times daily. Patient not taking: Reported on 06/12/2016 04/27/16   Sable Feil, PA-C  famotidine (PEPCID) 20 MG tablet Take 1 tablet (20 mg total) by mouth 2 (two) times daily. Patient not taking: Reported on 06/12/2016 05/09/16   Carrie Mew, MD  HYDROcodone-acetaminophen (NORCO/VICODIN) 5-325 MG tablet Take 1-2 tablets by mouth every 4 (four) hours as needed. Patient not taking: Reported on 08/28/2016 06/19/16   Gloriann Loan, PA-C  ibuprofen (ADVIL,MOTRIN) 600 MG tablet Take 1 tablet (600 mg total) by mouth every 8 (eight) hours as needed. Patient not taking: Reported on 06/12/2016 04/27/16   Sable Feil, PA-C  lidocaine (LIDODERM) 5 % Place 1 patch onto the skin daily. Patient not taking: Reported on 08/28/2016 06/12/16   Daymon Larsen, MD  metoCLOPramide (REGLAN) 10 MG tablet Take 1 tablet (10 mg total) by mouth 3 (three) times daily with meals. Patient not taking: Reported on 09/20/2016 06/12/16 06/22/16  Daymon Larsen, MD  metoCLOPramide (REGLAN) 10 MG tablet Take 1 tablet (10 mg total) by mouth 3 (three) times daily with meals. Patient not taking: Reported on 08/28/2016 07/11/16 07/11/17  Gregor Hams, MD  naproxen (NAPROSYN) 500 MG tablet Take 1 tablet (500 mg total) by mouth  2 (two) times daily. Patient not taking: Reported on 08/28/2016 06/19/16   Gloriann Loan, PA-C  ondansetron (ZOFRAN ODT) 4 MG disintegrating tablet Take 1 tablet (4 mg total) by mouth every 8 (eight) hours as needed for nausea or vomiting. Patient not taking: Reported on 09/20/2016 07/11/16   Gregor Hams, MD  ondansetron (ZOFRAN) 4 MG tablet Take 1 tablet (4 mg total) by mouth every 8 (eight) hours as needed  for nausea or vomiting. Patient not taking: Reported on 06/12/2016 03/13/16   Fritzi Mandes, MD  pantoprazole (PROTONIX) 40 MG tablet Take 1 tablet (40 mg total) by mouth daily. Patient not taking: Reported on 09/20/2016 06/07/16 06/07/17  Orbie Pyo, MD  pantoprazole (PROTONIX) 40 MG tablet Take 1 tablet (40 mg total) by mouth daily. Patient not taking: Reported on 08/28/2016 07/11/16 07/11/17  Gregor Hams, MD  traMADol (ULTRAM) 50 MG tablet Take 1 tablet (50 mg total) by mouth every 6 (six) hours as needed. Patient not taking: Reported on 06/12/2016 04/27/16 04/27/17  Sable Feil, PA-C    Allergies Patient has no known allergies.  Family History  Problem Relation Age of Onset  . Hypertension Mother   . Hypertension Father   . Diabetes Father   . Heart disease Father   . Hypertension Sister     Social History Social History  Substance Use Topics  . Smoking status: Current Every Day Smoker    Packs/day: 0.25    Years: 10.00    Types: Cigarettes  . Smokeless tobacco: Never Used  . Alcohol use No    Review of Systems Constitutional: No fever/chills Eyes: No visual changes. ENT: No sore throat. Cardiovascular: Denies chest pain. Respiratory: Denies shortness of breath. Gastrointestinal: See history of present illness Genitourinary: Negative for dysuria. Musculoskeletal: Negative for back pain. Skin: Negative for rash. Neurological: Negative for headaches, focal weakness or numbness.  10-point ROS otherwise negative.  ____________________________________________   PHYSICAL EXAM:  VITAL SIGNS: ED Triage Vitals  Enc Vitals Group     BP 09/20/16 1225 (!) 164/113     Pulse Rate 09/20/16 1225 84     Resp 09/20/16 1225 16     Temp 09/20/16 1225 98.2 F (36.8 C)     Temp Source 09/20/16 1225 Oral     SpO2 09/20/16 1225 100 %     Weight 09/20/16 1226 200 lb (90.7 kg)     Height 09/20/16 1226 5\' 7"  (1.702 m)     Head Circumference --      Peak Flow --        Pain Score 09/20/16 1230 7     Pain Loc --      Pain Edu? --      Excl. in [place]? --     Constitutional: Alert and oriented.Ill-appearing   Eyes: Conjunctivae are normal. PERRL. EOMI. Head: Atraumatic. Nose: No congestion/rhinnorhea. Mouth/Throat: Mucous membranes are moist.  Oropharynx non-erythematous. Neck: No stridor. Cardiovascular: Normal rate, regular rhythm. Grossly normal heart sounds.  Good peripheral circulation. Respiratory: Normal respiratory effort.  No retractions. Lungs CTAB. Gastrointestinal: Soft tender in the upper abdomen especially in the epigastric area No distention. No abdominal bruits. No CVA tenderness. Musculoskeletal: No lower extremity tenderness nor edema.  No joint effusions. Neurologic:  Normal speech and language. No gross focal neurologic deficits are appreciated. No gait instability. Skin:  Skin is warm, dry and intact. No rash noted.   ____________________________________________   LABS (all labs ordered are listed, but only abnormal results are  displayed)  Labs Reviewed  COMPREHENSIVE METABOLIC PANEL - Abnormal; Notable for the following:       Result Value   Glucose, Bld 145 (*)    Total Protein 8.5 (*)    Total Bilirubin 1.6 (*)    All other components within normal limits  CBC - Abnormal; Notable for the following:    WBC 22.7 (*)    All other components within normal limits  URINALYSIS, COMPLETE (UACMP) WITH MICROSCOPIC - Abnormal; Notable for the following:    Color, Urine YELLOW (*)    APPearance TURBID (*)    Specific Gravity, Urine 1.032 (*)    Glucose, UA 50 (*)    Hgb urine dipstick MODERATE (*)    Ketones, ur 20 (*)    Protein, ur >=300 (*)    Bacteria, UA RARE (*)    Squamous Epithelial / LPF TOO NUMEROUS TO COUNT (*)    All other components within normal limits  LIPASE, BLOOD  POC URINE PREG, ED  POCT PREGNANCY, URINE  TYPE AND SCREEN    ____________________________________________  EKG   ____________________________________________  RADIOLOGY Study Result   CLINICAL DATA:  Epigastric pain beginning more than 1 month ago.  EXAM: PORTABLE CHEST 1 VIEW  COMPARISON:  01/24/2015  FINDINGS: Heart and mediastinal contours are within normal limits. No focal opacities or effusions. No acute bony abnormality.  IMPRESSION: No active disease.   Electronically Signed   By: Rolm Baptise M.D.   On: 09/20/2016 14:22     ____________________________________________   PROCEDURES  Procedure(s) performed:   Procedures  Critical Care performed:  ____________________________________________   INITIAL IMPRESSION / ASSESSMENT AND PLAN / ED COURSE  Pertinent labs & imaging results that were available during my care of the patient were reviewed by me and considered in my medical decision making (see chart for details).   We are awaiting the results of the patients ct abdomen. Dr Jimmye Norman will review.     ____________________________________________   FINAL CLINICAL IMPRESSION(S) / ED DIAGNOSES  Final diagnoses:  Pain of upper abdomen      NEW MEDICATIONS STARTED DURING THIS VISIT:  New Prescriptions   No medications on file     Note:  This document was prepared using Dragon voice recognition software and may include unintentional dictation errors.    Nena Polio, MD 09/20/16 [phone]

## 2016-09-20 NOTE — ED Triage Notes (Signed)
Pt reports epigastric pain that started yesterday accompanied by vomiting.

## 2016-09-20 NOTE — ED Notes (Signed)
Unable to obtain IV access at this time. This RN attempted x2. Becky Sax, RN attempted x2. Unsuccessful. Larene Beach, RN at bedside to attempt IV ultrasound.

## 2016-09-20 NOTE — ED Notes (Signed)
Attempting to call report at this time. 

## 2016-09-20 NOTE — H&P (Addendum)
[facility] [facility] NAME: Jasmine Mason    MR#:  [id number]  DATE OF BIRTH:  10-May-1986  DATE OF ADMISSION:  09/20/2016  PRIMARY CARE PHYSICIAN: Jules Schick, NP   REQUESTING/REFERRING PHYSICIAN: Earleen Newport, MD  CHIEF COMPLAINT:   Chief Complaint  Patient presents with  . Abdominal Pain   Abdominal pain, nausea and vomiting for 2 days. HISTORY OF PRESENT ILLNESS:  Jasmine Mason  is a 31 y.o. female with a known history of GERD, back pain and hypertension. The patient presents to the ED with the above chief complaints. She has had the nausea, vomiting for 2 days. Abdominal pain is in epigastric area, intermittent without radiation. She also complains of vomit up clots of blood. She feels weak and dizzy.  PAST MEDICAL HISTORY:   Past Medical History:  Diagnosis Date  . Back pain   . Back pain   . GERD (gastroesophageal reflux disease)   . Hypertension   . Mesenteric adenitis   . Obesity   . Tobacco abuse     PAST SURGICAL HISTORY:   Past Surgical History:  Procedure Laterality Date  . CARPAL TUNNEL RELEASE Left ~ 2013  . ESOPHAGOGASTRODUODENOSCOPY N/A 01/25/2015   Procedure: ESOPHAGOGASTRODUODENOSCOPY (EGD);  Surgeon: Irene Shipper, MD;  Location: [facility] ENDOSCOPY;  Service: Endoscopy;  Laterality: N/A;  . ESOPHAGOGASTRODUODENOSCOPY (EGD) WITH PROPOFOL N/A 03/13/2016   Procedure: ESOPHAGOGASTRODUODENOSCOPY (EGD) WITH PROPOFOL;  Surgeon: Lucilla Lame, MD;  Location: [facility] ENDOSCOPY;  Service: Endoscopy;  Laterality: N/A;  . FRACTURE SURGERY    . ORIF ANKLE FRACTURE Left 1990's    SOCIAL HISTORY:   Social History  Substance Use Topics  . Smoking status: Current Every Day Smoker    Packs/day: 0.25    Years: 10.00    Types: Cigarettes  . Smokeless tobacco: Never Used  . Alcohol use No    FAMILY HISTORY:   Family History  Problem Relation Age of Onset  . Hypertension Mother   . Hypertension Father   .  Diabetes Father   . Heart disease Father   . Hypertension Sister     DRUG ALLERGIES:  No Known Allergies  REVIEW OF SYSTEMS:   Review of Systems  Constitutional: Positive for malaise/fatigue. Negative for chills and fever.  HENT: Negative for congestion.   Eyes: Negative for blurred vision and double vision.  Respiratory: Negative for cough, shortness of breath and stridor.   Cardiovascular: Negative for chest pain, palpitations and leg swelling.  Gastrointestinal: Positive for abdominal pain, nausea and vomiting. Negative for blood in stool, constipation, diarrhea and melena.  Genitourinary: Negative for dysuria and hematuria.  Musculoskeletal: Negative for back pain.  Skin: Negative for itching and rash.  Neurological: Positive for dizziness and weakness. Negative for focal weakness and loss of consciousness.  Psychiatric/Behavioral: Negative for depression. The patient is not nervous/anxious.     MEDICATIONS AT HOME:   Prior to Admission medications   Medication Sig Start Date End Date Taking? Authorizing Provider  aluminum-magnesium hydroxide-simethicone (MAALOX) [id number] MG/5ML SUSP Take 30 mLs by mouth 4 (four) times daily -  before meals and at bedtime. Patient not taking: Reported on 06/12/2016 05/09/16   Carrie Mew, MD  amoxicillin (AMOXIL) 500 MG capsule Take 1 capsule (500 mg total) by mouth 3 (three) times daily. Patient not taking: Reported on 06/12/2016 04/27/16   Sable Feil, PA-C  famotidine (PEPCID) 20 MG tablet Take 1 tablet (20 mg total) by mouth 2 (  two) times daily. Patient not taking: Reported on 06/12/2016 05/09/16   Carrie Mew, MD  HYDROcodone-acetaminophen (NORCO/VICODIN) 5-325 MG tablet Take 1-2 tablets by mouth every 4 (four) hours as needed. Patient not taking: Reported on 08/28/2016 06/19/16   Gloriann Loan, PA-C  ibuprofen (ADVIL,MOTRIN) 600 MG tablet Take 1 tablet (600 mg total) by mouth every 8 (eight) hours as needed. Patient not taking:  Reported on 06/12/2016 04/27/16   Sable Feil, PA-C  lidocaine (LIDODERM) 5 % Place 1 patch onto the skin daily. Patient not taking: Reported on 08/28/2016 06/12/16   Daymon Larsen, MD  metoCLOPramide (REGLAN) 10 MG tablet Take 1 tablet (10 mg total) by mouth 3 (three) times daily with meals. Patient not taking: Reported on 09/20/2016 06/12/16 06/22/16  Daymon Larsen, MD  metoCLOPramide (REGLAN) 10 MG tablet Take 1 tablet (10 mg total) by mouth 3 (three) times daily with meals. Patient not taking: Reported on 08/28/2016 07/11/16 07/11/17  Gregor Hams, MD  naproxen (NAPROSYN) 500 MG tablet Take 1 tablet (500 mg total) by mouth 2 (two) times daily. Patient not taking: Reported on 08/28/2016 06/19/16   Gloriann Loan, PA-C  ondansetron (ZOFRAN ODT) 4 MG disintegrating tablet Take 1 tablet (4 mg total) by mouth every 8 (eight) hours as needed for nausea or vomiting. Patient not taking: Reported on 09/20/2016 07/11/16   Gregor Hams, MD  ondansetron (ZOFRAN) 4 MG tablet Take 1 tablet (4 mg total) by mouth every 8 (eight) hours as needed for nausea or vomiting. Patient not taking: Reported on 06/12/2016 03/13/16   Fritzi Mandes, MD  pantoprazole (PROTONIX) 40 MG tablet Take 1 tablet (40 mg total) by mouth daily. Patient not taking: Reported on 09/20/2016 06/07/16 06/07/17  Orbie Pyo, MD  pantoprazole (PROTONIX) 40 MG tablet Take 1 tablet (40 mg total) by mouth daily. Patient not taking: Reported on 08/28/2016 07/11/16 07/11/17  Gregor Hams, MD  traMADol (ULTRAM) 50 MG tablet Take 1 tablet (50 mg total) by mouth every 6 (six) hours as needed. Patient not taking: Reported on 06/12/2016 04/27/16 04/27/17  Sable Feil, PA-C      VITAL SIGNS:  Blood pressure 125/77, pulse 80, temperature 98.6 F (37 C), temperature source Oral, resp. rate 17, height 5\' 7"  (1.702 m), weight 223 lb 11.2 oz (101.5 kg), last menstrual period 04/25/2016, SpO2 98 %.  PHYSICAL EXAMINATION:  Physical  Exam  GENERAL:  31 y.o.-year-old patient lying in the bed with no acute distress. Obesity. EYES: Pupils equal, round, reactive to light and accommodation. No scleral icterus. Extraocular muscles intact.  HEENT: Head atraumatic, normocephalic. Oropharynx and nasopharynx clear.  NECK:  Supple, no jugular venous distention. No thyroid enlargement, no tenderness.  LUNGS: Normal breath sounds bilaterally, no wheezing, rales,rhonchi or crepitation. No use of accessory muscles of respiration.  CARDIOVASCULAR: S1, S2 normal. No murmurs, rubs, or gallops.  ABDOMEN: Soft, epigastric area tenderness, nondistended. Bowel sounds present. No organomegaly or mass.  EXTREMITIES: No pedal edema, cyanosis, or clubbing.  NEUROLOGIC: Cranial nerves II through XII are intact. Muscle strength 5/5 in all extremities. Sensation intact. Gait not checked.  PSYCHIATRIC: The patient is alert and oriented x 3.  SKIN: No obvious rash, lesion, or ulcer.   LABORATORY PANEL:   CBC  Recent Labs Lab 09/20/16 1236  WBC 22.7*  HGB 14.7  HCT 44.2  PLT 391   ------------------------------------------------------------------------------------------------------------------  Chemistries   Recent Labs Lab 09/20/16 1236  NA 138  K 4.1  CL 106  CO2 22  GLUCOSE 145*  BUN 10  CREATININE 0.58  CALCIUM 9.4  AST 29  ALT 21  ALKPHOS 91  BILITOT 1.6*   ------------------------------------------------------------------------------------------------------------------  Cardiac Enzymes No results for input(s): TROPONINI in the last 168 hours. ------------------------------------------------------------------------------------------------------------------  RADIOLOGY:  Ct Abdomen Pelvis W Contrast  Result Date: 09/20/2016 CLINICAL DATA:  Upper abdominal pain, nausea/vomiting/diarrhea, leukocytosis EXAM: CT ABDOMEN AND PELVIS WITH CONTRAST TECHNIQUE: Multidetector CT imaging of the abdomen and pelvis was performed using  the standard protocol following bolus administration of intravenous contrast. CONTRAST:  146mL ISOVUE-300 IOPAMIDOL (ISOVUE-300) INJECTION 61% COMPARISON:  06/07/2016 FINDINGS: Lower chest: Lung bases are clear. Hepatobiliary: Liver is within normal limits, noting focal fat/altered perfusion along the falciform ligament. Layering gallbladder sludge (series 2/ image 32), without associated inflammatory changes. Pancreas: Within normal limits. Spleen: Within normal limits. Adrenals/Urinary Tract: Adrenal glands are within normal limits. Kidneys are within normal limits.  No hydronephrosis. Bladder is within normal limits. Stomach/Bowel: Stomach is within normal limits. No evidence of bowel obstruction. Normal appendix (series 2/ image 65). Vascular/Lymphatic: No evidence of abdominal aortic aneurysm. No suspicious abdominopelvic lymphadenopathy. Reproductive: Arcuate uterus, a normal variant. Stable enlargement of the bilateral ovaries, nonspecific. Suspected right Bartholin gland cyst (series 2/ image 91). Other: No abdominopelvic ascites. Musculoskeletal: Visualized osseous structures are within normal limits. IMPRESSION: No evidence of bowel obstruction.  Normal appendix. Layering gallbladder sludge, without associated inflammatory changes. Stable enlargement of the bilateral ovaries, nonspecific. Correlate for PCOS. No CT findings to account for the patient's abdominal pain. Electronically Signed   By: Julian Hy M.D.   On: 09/20/2016 16:46   Dg Chest Portable 1 View  Result Date: 09/20/2016 CLINICAL DATA:  Epigastric pain beginning more than 1 month ago. EXAM: PORTABLE CHEST 1 VIEW COMPARISON:  01/24/2015 FINDINGS: Heart and mediastinal contours are within normal limits. No focal opacities or effusions. No acute bony abnormality. IMPRESSION: No active disease. Electronically Signed   By: Rolm Baptise M.D.   On: 09/20/2016 14:22      IMPRESSION AND PLAN:   Acute gastritis The patient will be  placed for observation. IV fluid support and Zofran when necessary. Nothing by mouth except medication. Advance diet if patient improves.  Hematemesis. Start Protonix twice a day and follow-up hemoglobin.  Leukocytosis. Possible reaction due to gastritis. Follow-up CBC. Elevated bilirubin. Possible due to nausea vomiting.  Tobacco abuse. Smoking cessation was counseled for 4 minutes, giving nicotine patch.  All the records are reviewed and case discussed with ED provider. Management plans discussed with the patient, family and they are in agreement.  CODE STATUS: Full code  TOTAL TIME TAKING CARE OF THIS PATIENT: 52 minutes.    Demetrios Loll M.D on 09/20/2016 at 7:37 PM  Between 7am to 6pm - Pager - [phone]  After 6pm go to [email or web address] - Proofreader  [facility]  Office  [phone]  CC: Primary care physician; Jules Schick, NP   Note: This dictation was prepared with Dragon dictation along with smaller phrase technology. Any transcriptional errors that result from this process are unintentional.

## 2016-09-20 NOTE — ED Provider Notes (Signed)
Patient states she does not feel significantly better. Likely gastritis as she has been off her Protonix and Carafate. I will discuss with the hospitalist for admission.   Earleen Newport, MD 09/20/16 1726

## 2016-09-21 LAB — CBC
HEMATOCRIT: 41.2 % (ref 35.0–47.0)
HEMOGLOBIN: 13.9 g/dL (ref 12.0–16.0)
MCH: 29.4 pg (ref 26.0–34.0)
MCHC: 33.7 g/dL (ref 32.0–36.0)
MCV: 87.2 fL (ref 80.0–100.0)
Platelets: 334 10*3/uL (ref 150–440)
RBC: 4.73 MIL/uL (ref 3.80–5.20)
RDW: 14 % (ref 11.5–14.5)
WBC: 17.2 10*3/uL — AB (ref 3.6–11.0)

## 2016-09-21 LAB — BASIC METABOLIC PANEL
Anion gap: 8 (ref 5–15)
BUN: 10 mg/dL (ref 6–20)
CHLORIDE: 106 mmol/L (ref 101–111)
CO2: 24 mmol/L (ref 22–32)
Calcium: 8.8 mg/dL — ABNORMAL LOW (ref 8.9–10.3)
Creatinine, Ser: 0.73 mg/dL (ref 0.44–1.00)
GFR calc Af Amer: >60 mL/min (ref >60)
GLUCOSE: 90 mg/dL (ref 65–99)
POTASSIUM: 3.1 mmol/L — AB (ref 3.5–5.1)
SODIUM: 138 mmol/L (ref 135–145)

## 2016-09-21 MED ORDER — MAGNESIUM SULFATE 2 GM/50ML IV SOLN
2.0000 g | Freq: Once | INTRAVENOUS | Status: DC
  Filled 2016-09-21: qty 50

## 2016-09-21 MED ORDER — HYDROCODONE-ACETAMINOPHEN 5-325 MG PO TABS: 1.0000 | ORAL_TABLET | Freq: Four times a day (QID) | ORAL | 0 refills | Status: DC | PRN

## 2016-09-21 MED ORDER — POTASSIUM CHLORIDE CRYS ER 20 MEQ PO TBCR
40.0000 meq | EXTENDED_RELEASE_TABLET | Freq: Two times a day (BID) | ORAL | Status: DC
  Administered 2016-09-21: 40 meq via ORAL
  Filled 2016-09-21: qty 2

## 2016-09-21 MED ORDER — METOCLOPRAMIDE HCL 5 MG PO TABS: 5.0000 mg | ORAL_TABLET | Freq: Three times a day (TID) | ORAL | 0 refills | Status: DC | PRN

## 2016-09-21 NOTE — Care Management (Signed)
Patient admitted with gastritis.  Patient is self pay.  Does not have PCP.  Patient to discharge with scripts for pain medication and reglan.  Patient provided application for [facility] and Medication Management.  RNCM signing off.

## 2016-09-21 NOTE — Progress Notes (Signed)
09/21/2016 1:54 PM  [name] to be D/C'd Home per MD order.  Discussed prescriptions and follow up appointments with the patient. Prescriptions given to patient, medication list explained in detail. Pt verbalized understanding.    Vitals:   09/21/16 0506 09/21/16 0700  BP: 119/68 120/72  Pulse: 92 91  Resp: 18 17  Temp: 98.6 F (37 C) 98 F (36.7 C)    Skin clean, dry and intact without evidence of skin break down, no evidence of skin tears noted. IV catheter discontinued intact. Site without signs and symptoms of complications. Dressing and pressure applied. Pt denies pain at this time. No complaints noted.  An After Visit Summary was printed and given to the patient. Patient escorted via [place], and D/C home via private auto.  Dola Argyle

## 2016-09-22 LAB — HIV ANTIBODY (ROUTINE TESTING W REFLEX): HIV SCREEN 4TH GENERATION: NONREACTIVE

## 2016-09-24 ENCOUNTER — Other Ambulatory Visit: Payer: Self-pay | Admitting: Oncology

## 2016-09-24 DIAGNOSIS — D72829 Elevated white blood cell count, unspecified: Secondary | ICD-10-CM

## 2016-09-25 ENCOUNTER — Inpatient Hospital Stay: Payer: Self-pay | Admitting: Oncology

## 2016-09-25 ENCOUNTER — Inpatient Hospital Stay: Payer: Self-pay

## 2016-09-25 NOTE — Progress Notes (Deleted)
Jasmine Mason  [phone] [phone]  ID: [name] OB: May 03, 1986  MR#: [id number]  [id number]  Patient Care Team: Jules Schick, NP as PCP - General (Nurse Practitioner)  CHIEF COMPLAINT: Leukocytosis.  INTERVAL HISTORY: Patient is a 31 year old female who is undergone extensive workup for persistent abdominal pain. She was also noted to have a persistently elevated white blood cell count. Currently she feels well and is asymptomatic. She does not have abdominal pain today. She is no neurologic complaints. She denies any recent fevers. She is a good appetite and denies weight loss. She has no chest pain or shortness of breath. She denies any nausea, vomiting, constipation, or diarrhea. She has no melena or hematochezia. She has no urinary complaints. Patient otherwise feels well and offers no further specific complaints today.  REVIEW OF SYSTEMS:   Review of Systems  Constitutional: Negative.  Negative for fever and malaise/fatigue.  Respiratory: Negative.  Negative for cough and shortness of breath.   Cardiovascular: Negative.  Negative for chest pain and leg swelling.  Gastrointestinal: Positive for abdominal pain. Negative for blood in stool, constipation, diarrhea, melena, nausea and vomiting.  Genitourinary: Negative.   Musculoskeletal: Negative.   Neurological: Negative.  Negative for weakness.  Psychiatric/Behavioral: Negative.  The patient is not nervous/anxious.     As per HPI. Otherwise, a complete review of systems is negative.  PAST MEDICAL HISTORY: Past Medical History:  Diagnosis Date  . Back pain   . Back pain   . GERD (gastroesophageal reflux disease)   . Hypertension   . Mesenteric adenitis   . Obesity   . Tobacco abuse     PAST SURGICAL HISTORY: Past Surgical History:  Procedure Laterality Date  . CARPAL TUNNEL RELEASE Left ~ 2013  . ESOPHAGOGASTRODUODENOSCOPY N/A 01/25/2015   Procedure:  ESOPHAGOGASTRODUODENOSCOPY (EGD);  Surgeon: Irene Shipper, MD;  Location: [facility] ENDOSCOPY;  Service: Endoscopy;  Laterality: N/A;  . ESOPHAGOGASTRODUODENOSCOPY (EGD) WITH PROPOFOL N/A 03/13/2016   Procedure: ESOPHAGOGASTRODUODENOSCOPY (EGD) WITH PROPOFOL;  Surgeon: Lucilla Lame, MD;  Location: [facility] ENDOSCOPY;  Service: Endoscopy;  Laterality: N/A;  . FRACTURE SURGERY    . ORIF ANKLE FRACTURE Left 1990's    FAMILY HISTORY: Family History  Problem Relation Age of Onset  . Hypertension Mother   . Hypertension Father   . Diabetes Father   . Heart disease Father   . Hypertension Sister     ADVANCED DIRECTIVES (Y/N):  N  HEALTH MAINTENANCE: Social History  Substance Use Topics  . Smoking status: Current Every Day Smoker    Packs/day: 0.25    Years: 10.00    Types: Cigarettes  . Smokeless tobacco: Never Used  . Alcohol use No     Colonoscopy:  PAP:  Bone density:  Lipid panel:  No Known Allergies  Current Outpatient Prescriptions  Medication Sig Dispense Refill  . famotidine (PEPCID) 20 MG tablet Take 1 tablet (20 mg total) by mouth 2 (two) times daily. (Patient not taking: Reported on 06/12/2016) 60 tablet 0  . HYDROcodone-acetaminophen (NORCO/VICODIN) 5-325 MG tablet Take 1 tablet by mouth every 6 (six) hours as needed for moderate pain or severe pain. 10 tablet 0  . lidocaine (LIDODERM) 5 % Place 1 patch onto the skin daily. (Patient not taking: Reported on 08/28/2016) 15 patch 0  . metoCLOPramide (REGLAN) 5 MG tablet Take 1 tablet (5 mg total) by mouth every 8 (eight) hours as needed for nausea or vomiting. 10 tablet 0  . pantoprazole (PROTONIX) 40 MG  tablet Take 1 tablet (40 mg total) by mouth daily. (Patient not taking: Reported on 09/20/2016) 30 tablet 0  . pantoprazole (PROTONIX) 40 MG tablet Take 1 tablet (40 mg total) by mouth daily. (Patient not taking: Reported on 08/28/2016) 30 tablet 0   No current facility-administered medications for this visit.     OBJECTIVE: There  were no vitals filed for this visit.   There is no height or weight on file to calculate BMI.    ECOG FS:0 - Asymptomatic  General: Well-developed, well-nourished, no acute distress. Eyes: Pink conjunctiva, anicteric sclera. HEENT: Normocephalic, moist mucous membranes, clear oropharnyx. Lungs: Clear to auscultation bilaterally. Heart: Regular rate and rhythm. No rubs, murmurs, or gallops. Abdomen: Soft, nontender, nondistended. No organomegaly noted, normoactive bowel sounds. Musculoskeletal: No edema, cyanosis, or clubbing. Neuro: Alert, answering all questions appropriately. Cranial nerves grossly intact. Skin: No rashes or petechiae noted. Psych: Normal affect. Lymphatics: No cervical, calvicular, axillary or inguinal LAD.   LAB RESULTS:  Lab Results  Component Value Date   NA 138 09/21/2016   K 3.1 (L) 09/21/2016   CL 106 09/21/2016   CO2 24 09/21/2016   GLUCOSE 90 09/21/2016   BUN 10 09/21/2016   CREATININE 0.73 09/21/2016   CALCIUM 8.8 (L) 09/21/2016   PROT 8.5 (H) 09/20/2016   ALBUMIN 4.7 09/20/2016   AST 29 09/20/2016   ALT 21 09/20/2016   ALKPHOS 91 09/20/2016   BILITOT 1.6 (H) 09/20/2016   GFRNONAA >60 09/21/2016   GFRAA >60 09/21/2016    Lab Results  Component Value Date   WBC 17.2 (H) 09/21/2016   NEUTROABS 7.8 (H) 08/28/2016   HGB 13.9 09/21/2016   HCT 41.2 09/21/2016   MCV 87.2 09/21/2016   PLT 334 09/21/2016     STUDIES: Ct Abdomen Pelvis W Contrast  Result Date: 09/20/2016 CLINICAL DATA:  Upper abdominal pain, nausea/vomiting/diarrhea, leukocytosis EXAM: CT ABDOMEN AND PELVIS WITH CONTRAST TECHNIQUE: Multidetector CT imaging of the abdomen and pelvis was performed using the standard protocol following bolus administration of intravenous contrast. CONTRAST:  157mL ISOVUE-300 IOPAMIDOL (ISOVUE-300) INJECTION 61% COMPARISON:  06/07/2016 FINDINGS: Lower chest: Lung bases are clear. Hepatobiliary: Liver is within normal limits, noting focal fat/altered  perfusion along the falciform ligament. Layering gallbladder sludge (series 2/ image 32), without associated inflammatory changes. Pancreas: Within normal limits. Spleen: Within normal limits. Adrenals/Urinary Tract: Adrenal glands are within normal limits. Kidneys are within normal limits.  No hydronephrosis. Bladder is within normal limits. Stomach/Bowel: Stomach is within normal limits. No evidence of bowel obstruction. Normal appendix (series 2/ image 65). Vascular/Lymphatic: No evidence of abdominal aortic aneurysm. No suspicious abdominopelvic lymphadenopathy. Reproductive: Arcuate uterus, a normal variant. Stable enlargement of the bilateral ovaries, nonspecific. Suspected right Bartholin gland cyst (series 2/ image 91). Other: No abdominopelvic ascites. Musculoskeletal: Visualized osseous structures are within normal limits. IMPRESSION: No evidence of bowel obstruction.  Normal appendix. Layering gallbladder sludge, without associated inflammatory changes. Stable enlargement of the bilateral ovaries, nonspecific. Correlate for PCOS. No CT findings to account for the patient's abdominal pain. Electronically Signed   By: Julian Hy M.D.   On: 09/20/2016 16:46   Dg Chest Portable 1 View  Result Date: 09/20/2016 CLINICAL DATA:  Epigastric pain beginning more than 1 month ago. EXAM: PORTABLE CHEST 1 VIEW COMPARISON:  01/24/2015 FINDINGS: Heart and mediastinal contours are within normal limits. No focal opacities or effusions. No acute bony abnormality. IMPRESSION: No active disease. Electronically Signed   By: Rolm Baptise M.D.   On: 09/20/2016  14:22    ASSESSMENT: Leukocytosis  PLAN:    1. Leukocytosis: Patient's white blood cell count is within normal limits today. Her previously elevations are likely reactive secondary to ongoing abdominal pain. All of her other laboratory work is also either negative or within normal limits. Peripheral blood flow cytometry and BCR-ABL are pending at time of  dictation. Return to clinic in 1 month with repeat laboratory work and further evaluation. If the remainder of her laboratory work is negative and her white blood cell count remains within normal limits, patient can likely be discharged from clinic.  2. Abdominal pain: Unclear etiology. Patient has had extensive workup in the past without it stings cause determined.   Patient expressed understanding and was in agreement with this plan. She also understands that She can call clinic at any time with any questions, concerns, or complaints.   Cancer Staging No matching staging information was found for the patient.  Lloyd Hug[facility], MD   09/25/2016 12:06 AM

## 2016-09-25 NOTE — Discharge Summary (Signed)
[facility] NAME: Jasmine Mason    MR#:  [id number]  DATE OF BIRTH:  12-08-1985  DATE OF ADMISSION:  09/20/2016 ADMITTING PHYSICIAN: Demetrios Loll, MD  DATE OF DISCHARGE: 09/21/2016  2:05 PM  PRIMARY CARE PHYSICIAN: Mercie Eon F, NP    ADMISSION DIAGNOSIS:  Pain of upper abdomen [R10.10] Hematemesis with nausea [K92.0]  DISCHARGE DIAGNOSIS:  Active Problems:   Acute gastritis   SECONDARY DIAGNOSIS:   Past Medical History:  Diagnosis Date  . Back pain   . Back pain   . GERD (gastroesophageal reflux disease)   . Hypertension   . Mesenteric adenitis   . Obesity   . Tobacco abuse     HOSPITAL COURSE:   Kept on supportive care, and felt better next day, tolerated fluids and then soft food. Discharged with diagnosis of gastritis.  FOr hematemesis, Hb monitored, stable, and no more episodes, most likely mucosal tear, no complains next day.  DISCHARGE CONDITIONS:   Stable.  CONSULTS OBTAINED:    DRUG ALLERGIES:  No Known Allergies  DISCHARGE MEDICATIONS:   Discharge Medication List as of 09/21/2016 12:32 PM    CONTINUE these medications which have CHANGED   Details  HYDROcodone-acetaminophen (NORCO/VICODIN) 5-325 MG tablet Take 1 tablet by mouth every 6 (six) hours as needed for moderate pain or severe pain., Starting Fri 09/21/2016, Print    metoCLOPramide (REGLAN) 5 MG tablet Take 1 tablet (5 mg total) by mouth every 8 (eight) hours as needed for nausea or vomiting., Starting Fri 09/21/2016, Print      CONTINUE these medications which have NOT CHANGED   Details  famotidine (PEPCID) 20 MG tablet Take 1 tablet (20 mg total) by mouth 2 (two) times daily., Starting Wed 05/09/2016, Print    lidocaine (LIDODERM) 5 % Place 1 patch onto the skin daily., Starting Tue 06/12/2016, Print    !! pantoprazole (PROTONIX) 40 MG tablet Take 1 tablet (40 mg total) by mouth daily., Starting Thu 06/07/2016, Until Fri  06/07/2017, Print    !! pantoprazole (PROTONIX) 40 MG tablet Take 1 tablet (40 mg total) by mouth daily., Starting Wed 07/11/2016, Until Thu 07/11/2017, Print     !! - Potential duplicate medications found. Please discuss with provider.    STOP taking these medications     aluminum-magnesium hydroxide-simethicone (MAALOX) 200-200-20 MG/5ML SUSP      amoxicillin (AMOXIL) 500 MG capsule      ibuprofen (ADVIL,MOTRIN) 600 MG tablet      naproxen (NAPROSYN) 500 MG tablet      ondansetron (ZOFRAN ODT) 4 MG disintegrating tablet      ondansetron (ZOFRAN) 4 MG tablet      traMADol (ULTRAM) 50 MG tablet          DISCHARGE INSTRUCTIONS:    Follow with PMD in 1 week.  If you experience worsening of your admission symptoms, develop shortness of breath, life threatening emergency, suicidal or homicidal thoughts you must seek medical attention immediately by calling 911 or calling your MD immediately  if symptoms less severe.  You Must read complete instructions/literature along with all the possible adverse reactions/side effects for all the Medicines you take and that have been prescribed to you. Take any new Medicines after you have completely understood and accept all the possible adverse reactions/side effects.   Please note  You were cared for by a hospitalist during your hospital stay. If you have any questions about your discharge medications or the  care you received while you were in the hospital after you are discharged, you can call the unit and asked to speak with the hospitalist on call if the hospitalist that took care of you is not available. Once you are discharged, your primary care physician will handle any further medical issues. Please note that NO REFILLS for any discharge medications will be authorized once you are discharged, as it is imperative that you return to your primary care physician (or establish a relationship with a primary care physician if you do not have  one) for your aftercare needs so that they can reassess your need for medications and monitor your lab values.    Today   CHIEF COMPLAINT:   Chief Complaint  Patient presents with  . Abdominal Pain    HISTORY OF PRESENT ILLNESS:  Jasmine Mason  is a 31 y.o. female with a known history of GERD, back pain and hypertension. The patient presents to the ED with the above chief complaints. She has had the nausea, vomiting for 2 days. Abdominal pain is in epigastric area, intermittent without radiation. She also complains of vomit up clots of blood. She feels weak and dizzy.   VITAL SIGNS:  Blood pressure 120/72, pulse 91, temperature 98 F (36.7 C), temperature source Oral, resp. rate 17, height 5\' 7"  (1.702 m), weight 101.5 kg (223 lb 11.2 oz), last menstrual period 04/25/2016, SpO2 98 %.  I/O:  No intake or output data in the 24 hours ending 09/25/16 2145  PHYSICAL EXAMINATION:  GENERAL:  31 y.o.-year-old patient lying in the bed with no acute distress.  EYES: Pupils equal, round, reactive to light and accommodation. No scleral icterus. Extraocular muscles intact.  HEENT: Head atraumatic, normocephalic. Oropharynx and nasopharynx clear.  NECK:  Supple, no jugular venous distention. No thyroid enlargement, no tenderness.  LUNGS: Normal breath sounds bilaterally, no wheezing, rales,rhonchi or crepitation. No use of accessory muscles of respiration.  CARDIOVASCULAR: S1, S2 normal. No murmurs, rubs, or gallops.  ABDOMEN: Soft, non-tender, non-distended. Bowel sounds present. No organomegaly or mass.  EXTREMITIES: No pedal edema, cyanosis, or clubbing.  NEUROLOGIC: Cranial nerves II through XII are intact. Muscle strength 5/5 in all extremities. Sensation intact. Gait not checked.  PSYCHIATRIC: The patient is alert and oriented x 3.  SKIN: No obvious rash, lesion, or ulcer.   DATA REVIEW:   CBC  Recent Labs Lab 09/21/16 0600  WBC 17.2*  HGB 13.9  HCT 41.2  PLT 334     Chemistries   Recent Labs Lab 09/20/16 1236 09/20/16 1936 09/21/16 0600  NA 138  --  138  K 4.1  --  3.1*  CL 106  --  106  CO2 22  --  24  GLUCOSE 145*  --  90  BUN 10  --  10  CREATININE 0.58  --  0.73  CALCIUM 9.4  --  8.8*  MG  --  1.6*  --   AST 29  --   --   ALT 21  --   --   ALKPHOS 91  --   --   BILITOT 1.6*  --   --     Cardiac Enzymes No results for input(s): TROPONINI in the last 168 hours.  Microbiology Results  Results for orders placed or performed during the hospital encounter of 06/07/16  Wet prep, genital     Status: None   Collection Time: 06/07/16  4:23 AM  Result Value Ref Range Status   Yeast Wet  Prep HPF POC NONE SEEN NONE SEEN Final   Trich, Wet Prep NONE SEEN NONE SEEN Final   Clue Cells Wet Prep HPF POC NONE SEEN NONE SEEN Final   WBC, Wet Prep HPF POC NONE SEEN NONE SEEN Final   Sperm NONE SEEN  Final  Chlamydia/NGC rt PCR ([facility] only)     Status: None   Collection Time: 06/07/16  4:23 AM  Result Value Ref Range Status   Specimen source GC/Chlam ENDOCERVICAL  Final   Chlamydia Tr NOT DETECTED NOT DETECTED Final   N gonorrhoeae NOT DETECTED NOT DETECTED Final    Comment: (NOTE) 100  This methodology has not been evaluated in pregnant women or in 200  patients with a history of hysterectomy. 300 400  This methodology will not be performed on patients less than 66  years of age.     RADIOLOGY:  No results found.  EKG:   Orders placed or performed during the hospital encounter of 09/20/16  . EKG 12-Lead  . EKG 12-Lead  . EKG      Management plans discussed with the patient, family and they are in agreement.  CODE STATUS:  Code Status History    Date Active Date Inactive Code Status Order ID Comments User Context   09/20/2016  7:23 PM 09/21/2016  5:11 PM Full Code [id number]  Demetrios Loll, MD Inpatient   03/11/2016 12:46 AM 03/13/2016  7:28 PM Full Code [id number]  Harvie Bridge, DO ED   01/24/2015  3:06 AM 01/25/2015  6:30 PM Full  Code [id number]  Ivor Costa, MD Inpatient      TOTAL TIME TAKING CARE OF THIS PATIENT: 35 minutes.    Vaughan Basta M.D on 09/25/2016 at 9:45 PM  Between 7am to 6pm - Pager - [phone]  After 6pm go to [email or web address] - password [email or web address] [facility]  Office  [phone]  CC: Primary care physician; Jules Schick, NP   Note: This dictation was prepared with Dragon dictation along with smaller phrase technology. Any transcriptional errors that result from this process are unintentional.

## 2016-09-26 NOTE — Progress Notes (Signed)
[facility]  [phone] [phone]  ID: Jasmine Mason OB: [birth year]  MR#: [id number]  YFE#:[id number]  Patient Care Team: Phill Myron, NP as PCP - General (Nurse Practitioner)  CHIEF COMPLAINT: Leukocytosis.  INTERVAL HISTORY: Patient returns to clinic today for further evaluation and repeat laboratory work. She was recently admitted to the hospital once again for persistent nausea and abdominal pain. This is currently resolved and she feels back to her baseline. She does not have abdominal pain today. She has no neurologic complaints. She denies any recent fevers. She is a good appetite and denies weight loss. She has no chest pain or shortness of breath. She denies any nausea, vomiting, constipation, or diarrhea. She has no melena or hematochezia. She has no urinary complaints. Patient offers no specific complaints today.  REVIEW OF SYSTEMS:   Review of Systems  Constitutional: Negative.  Negative for fever and malaise/fatigue.  Respiratory: Negative.  Negative for cough and shortness of breath.   Cardiovascular: Negative.  Negative for chest pain and leg swelling.  Gastrointestinal: Negative for abdominal pain, blood in stool, constipation, diarrhea, melena, nausea and vomiting.  Genitourinary: Negative.   Musculoskeletal: Negative.   Neurological: Negative.  Negative for weakness.  Psychiatric/Behavioral: Negative.  The patient is not nervous/anxious.     As per HPI. Otherwise, a complete review of systems is negative.  PAST MEDICAL HISTORY: Past Medical History:  Diagnosis Date  . Back pain   . Back pain   . GERD (gastroesophageal reflux disease)   . Hypertension   . Mesenteric adenitis   . Obesity   . Tobacco abuse     PAST SURGICAL HISTORY: Past Surgical History:  Procedure Laterality Date  . CARPAL TUNNEL RELEASE Left ~ 2013  . ESOPHAGOGASTRODUODENOSCOPY N/A 01/25/2015   Procedure: ESOPHAGOGASTRODUODENOSCOPY (EGD);   Surgeon: Hilarie Fredrickson, MD;  Location: [facility] ENDOSCOPY;  Service: Endoscopy;  Laterality: N/A;  . ESOPHAGOGASTRODUODENOSCOPY (EGD) WITH PROPOFOL N/A 03/13/2016   Procedure: ESOPHAGOGASTRODUODENOSCOPY (EGD) WITH PROPOFOL;  Surgeon: Midge Minium, MD;  Location: [facility] ENDOSCOPY;  Service: Endoscopy;  Laterality: N/A;  . FRACTURE SURGERY    . ORIF ANKLE FRACTURE Left 1990's    FAMILY HISTORY: Family History  Problem Relation Age of Onset  . Hypertension Mother   . Hypertension Father   . Diabetes Father   . Heart disease Father   . Hypertension Sister     ADVANCED DIRECTIVES (Y/N):  N  HEALTH MAINTENANCE: Social History  Substance Use Topics  . Smoking status: Current Every Day Smoker    Packs/day: 0.25    Years: 10.00    Types: Cigarettes  . Smokeless tobacco: Never Used  . Alcohol use No     Colonoscopy:  PAP:  Bone density:  Lipid panel:  No Known Allergies  Current Outpatient Prescriptions  Medication Sig Dispense Refill  . famotidine (PEPCID) 20 MG tablet Take 1 tablet (20 mg total) by mouth 2 (two) times daily. (Patient not taking: Reported on 06/12/2016) 60 tablet 0  . HYDROcodone-acetaminophen (NORCO/VICODIN) 5-325 MG tablet Take 1 tablet by mouth every 6 (six) hours as needed for moderate pain or severe pain. (Patient not taking: Reported on 09/28/2016) 10 tablet 0  . lidocaine (LIDODERM) 5 % Place 1 patch onto the skin daily. (Patient not taking: Reported on 08/28/2016) 15 patch 0  . metoCLOPramide (REGLAN) 5 MG tablet Take 1 tablet (5 mg total) by mouth every 8 (eight) hours as needed for nausea or vomiting. (Patient not taking: Reported on 09/28/2016)  10 tablet 0  . pantoprazole (PROTONIX) 40 MG tablet Take 1 tablet (40 mg total) by mouth daily. (Patient not taking: Reported on 09/20/2016) 30 tablet 0  . pantoprazole (PROTONIX) 40 MG tablet Take 1 tablet (40 mg total) by mouth daily. (Patient not taking: Reported on 08/28/2016) 30 tablet 0   No current facility-administered  medications for this visit.     OBJECTIVE: Vitals:   09/28/16 1409  BP: 132/68  Pulse: 85  Resp: 18  Temp: 98.9 F (37.2 C)     Body mass index is 35.32 kg/m.    ECOG FS:0 - Asymptomatic  General: Well-developed, well-nourished, no acute distress. Eyes: Pink conjunctiva, anicteric sclera. Lungs: Clear to auscultation bilaterally. Heart: Regular rate and rhythm. No rubs, murmurs, or gallops. Abdomen: Soft, nontender, nondistended. No organomegaly noted, normoactive bowel sounds. Musculoskeletal: No edema, cyanosis, or clubbing. Neuro: Alert, answering all questions appropriately. Cranial nerves grossly intact. Skin: No rashes or petechiae noted. Psych: Normal affect.    LAB RESULTS:  Lab Results  Component Value Date   NA 138 09/21/2016   K 3.1 (L) 09/21/2016   CL 106 09/21/2016   CO2 24 09/21/2016   GLUCOSE 90 09/21/2016   BUN 10 09/21/2016   CREATININE 0.73 09/21/2016   CALCIUM 8.8 (L) 09/21/2016   PROT 8.5 (H) 09/20/2016   ALBUMIN 4.7 09/20/2016   AST 29 09/20/2016   ALT 21 09/20/2016   ALKPHOS 91 09/20/2016   BILITOT 1.6 (H) 09/20/2016   GFRNONAA >60 09/21/2016   GFRAA >60 09/21/2016    Lab Results  Component Value Date   WBC 12.5 (H) 09/28/2016   NEUTROABS 7.3 (H) 09/28/2016   HGB 13.9 09/28/2016   HCT 41.8 09/28/2016   MCV 88.1 09/28/2016   PLT 342 09/28/2016     STUDIES: Ct Abdomen Pelvis W Contrast  Result Date: 09/20/2016 CLINICAL DATA:  Upper abdominal pain, nausea/vomiting/diarrhea, leukocytosis EXAM: CT ABDOMEN AND PELVIS WITH CONTRAST TECHNIQUE: Multidetector CT imaging of the abdomen and pelvis was performed using the standard protocol following bolus administration of intravenous contrast. CONTRAST:  ISOVUE-300 IOPAMIDOL (ISOVUE-300) INJECTION 61% COMPARISON:  06/07/2016 FINDINGS: Lower chest: Lung bases are clear. Hepatobiliary: Liver is within normal limits, noting focal fat/altered perfusion along the falciform ligament. Layering  gallbladder sludge (series 2/ image 32), without associated inflammatory changes. Pancreas: Within normal limits. Spleen: Within normal limits. Adrenals/Urinary Tract: Adrenal glands are within normal limits. Kidneys are within normal limits.  No hydronephrosis. Bladder is within normal limits. Stomach/Bowel: Stomach is within normal limits. No evidence of bowel obstruction. Normal appendix (series 2/ image 65). Vascular/Lymphatic: No evidence of abdominal aortic aneurysm. No suspicious abdominopelvic lymphadenopathy. Reproductive: Arcuate uterus, a normal variant. Stable enlargement of the bilateral ovaries, nonspecific. Suspected right Bartholin gland cyst (series 2/ image 91). Other: No abdominopelvic ascites. Musculoskeletal: Visualized osseous structures are within normal limits. IMPRESSION: No evidence of bowel obstruction.  Normal appendix. Layering gallbladder sludge, without associated inflammatory changes. Stable enlargement of the bilateral ovaries, nonspecific. Correlate for PCOS. No CT findings to account for the patient's abdominal pain. Electronically Signed   By: Charline Bills M.D.   On: 09/20/2016 16:46   Dg Chest Portable 1 View  Result Date: 09/20/2016 CLINICAL DATA:  Epigastric pain beginning more than 1 month ago. EXAM: PORTABLE CHEST 1 VIEW COMPARISON:  01/24/2015 FINDINGS: Heart and mediastinal contours are within normal limits. No focal opacities or effusions. No acute bony abnormality. IMPRESSION: No active disease. Electronically Signed   By: Charlett Nose M.D.  On: 09/20/2016 14:22    ASSESSMENT: Leukocytosis  PLAN:    1. Leukocytosis: Patient's white blood cell count is only mildly elevated today. Her previously elevations are likely reactive secondary to ongoing abdominal pain. All of her other laboratory work is also either negative or within normal limits. Peripheral blood flow cytometry and BCR-ABL are also negative. No further intervention is needed at this time.  Patient does not require bone marrow biopsy. No follow-up has been scheduled.  2. Abdominal pain: Unclear etiology. Patient has had extensive workup in the past without a cause determined.  Patient expressed understanding and was in agreement with this plan. She also understands that She can call clinic at any time with any questions, concerns, or complaints.    Lloyd Huger, MD   09/28/2016 2:42 PM

## 2016-09-28 ENCOUNTER — Inpatient Hospital Stay: Payer: Self-pay | Attending: Oncology

## 2016-09-28 ENCOUNTER — Inpatient Hospital Stay (HOSPITAL_BASED_OUTPATIENT_CLINIC_OR_DEPARTMENT_OTHER): Payer: Self-pay | Admitting: Oncology

## 2016-09-28 VITALS — BP 132/68 | HR 85 | Temp 98.9°F | Resp 18 | Wt 225.5 lb

## 2016-09-28 DIAGNOSIS — K219 Gastro-esophageal reflux disease without esophagitis: Secondary | ICD-10-CM | POA: Insufficient documentation

## 2016-09-28 DIAGNOSIS — D72829 Elevated white blood cell count, unspecified: Secondary | ICD-10-CM

## 2016-09-28 DIAGNOSIS — F17210 Nicotine dependence, cigarettes, uncomplicated: Secondary | ICD-10-CM | POA: Insufficient documentation

## 2016-09-28 DIAGNOSIS — E669 Obesity, unspecified: Secondary | ICD-10-CM | POA: Insufficient documentation

## 2016-09-28 DIAGNOSIS — R109 Unspecified abdominal pain: Secondary | ICD-10-CM | POA: Insufficient documentation

## 2016-09-28 DIAGNOSIS — I10 Essential (primary) hypertension: Secondary | ICD-10-CM | POA: Insufficient documentation

## 2016-09-28 LAB — CBC WITH DIFFERENTIAL/PLATELET
BASOS PCT: 1 %
Basophils Absolute: 0.1 10*3/uL (ref 0–0.1)
EOS ABS: 0.3 10*3/uL (ref 0–0.7)
Eosinophils Relative: 2 %
HEMATOCRIT: 41.8 % (ref 35.0–47.0)
Hemoglobin: 13.9 g/dL (ref 12.0–16.0)
Lymphocytes Relative: 31 %
Lymphs Abs: 3.9 10*3/uL — ABNORMAL HIGH (ref 1.0–3.6)
MCH: 29.3 pg (ref 26.0–34.0)
MCHC: 33.3 g/dL (ref 32.0–36.0)
MCV: 88.1 fL (ref 80.0–100.0)
MONO ABS: 1 10*3/uL — AB (ref 0.2–0.9)
MONOS PCT: 8 %
NEUTROS ABS: 7.3 10*3/uL — AB (ref 1.4–6.5)
Neutrophils Relative %: 58 %
PLATELETS: 342 10*3/uL (ref 150–440)
RBC: 4.75 MIL/uL (ref 3.80–5.20)
RDW: 14 % (ref 11.5–14.5)
WBC: 12.5 10*3/uL — ABNORMAL HIGH (ref 3.6–11.0)

## 2016-10-11 ENCOUNTER — Emergency Department
Admission: EM | Admit: 2016-10-11 | Discharge: 2016-10-11 | Disposition: A | Discharge status: Home / Self Care | Payer: Self-pay | Attending: Emergency Medicine | Admitting: Emergency Medicine

## 2016-10-11 ENCOUNTER — Emergency Department: Payer: Self-pay

## 2016-10-11 DIAGNOSIS — R1013 Epigastric pain: Secondary | ICD-10-CM

## 2016-10-11 DIAGNOSIS — F17210 Nicotine dependence, cigarettes, uncomplicated: Secondary | ICD-10-CM | POA: Insufficient documentation

## 2016-10-11 DIAGNOSIS — K2950 Unspecified chronic gastritis without bleeding: Secondary | ICD-10-CM | POA: Insufficient documentation

## 2016-10-11 DIAGNOSIS — I10 Essential (primary) hypertension: Secondary | ICD-10-CM | POA: Insufficient documentation

## 2016-10-11 LAB — CBC WITH DIFFERENTIAL/PLATELET
BASOS ABS: 0.1 10*3/uL (ref 0–0.1)
Basophils Relative: 1 %
EOS ABS: 0.3 10*3/uL (ref 0–0.7)
EOS PCT: 2 %
HCT: 42.1 % (ref 35.0–47.0)
Hemoglobin: 14.2 g/dL (ref 12.0–16.0)
Lymphocytes Relative: 13 %
Lymphs Abs: 2 10*3/uL (ref 1.0–3.6)
MCH: 29.8 pg (ref 26.0–34.0)
MCHC: 33.7 g/dL (ref 32.0–36.0)
MCV: 88.4 fL (ref 80.0–100.0)
Monocytes Absolute: 1.2 10*3/uL — ABNORMAL HIGH (ref 0.2–0.9)
Monocytes Relative: 8 %
Neutro Abs: 11.7 10*3/uL — ABNORMAL HIGH (ref 1.4–6.5)
Neutrophils Relative %: 76 %
PLATELETS: 321 10*3/uL (ref 150–440)
RBC: 4.76 MIL/uL (ref 3.80–5.20)
RDW: 13.9 % (ref 11.5–14.5)
WBC: 15.3 10*3/uL — AB (ref 3.6–11.0)

## 2016-10-11 LAB — COMPREHENSIVE METABOLIC PANEL
ALT: 24 U/L (ref 14–54)
AST: 27 U/L (ref 15–41)
Albumin: 4.3 g/dL (ref 3.5–5.0)
Alkaline Phosphatase: 88 U/L (ref 38–126)
Anion gap: 9 (ref 5–15)
BUN: 9 mg/dL (ref 6–20)
CHLORIDE: 109 mmol/L (ref 101–111)
CO2: 22 mmol/L (ref 22–32)
CREATININE: 0.67 mg/dL (ref 0.44–1.00)
Calcium: 8.8 mg/dL — ABNORMAL LOW (ref 8.9–10.3)
GFR calc Af Amer: >60 mL/min (ref >60)
GFR calc non Af Amer: >60 mL/min (ref >60)
Glucose, Bld: 118 mg/dL — ABNORMAL HIGH (ref 65–99)
Potassium: 3.7 mmol/L (ref 3.5–5.1)
SODIUM: 140 mmol/L (ref 135–145)
Total Bilirubin: 0.6 mg/dL (ref 0.3–1.2)
Total Protein: 7.6 g/dL (ref 6.5–8.1)

## 2016-10-11 LAB — LIPASE, BLOOD: Lipase: 34 U/L (ref 11–51)

## 2016-10-11 MED ORDER — HYDROMORPHONE HCL 1 MG/ML IJ SOLN
1.0000 mg | Freq: Once | INTRAMUSCULAR | Status: AC
  Administered 2016-10-11: 1 mg via INTRAVENOUS
  Filled 2016-10-11: qty 1

## 2016-10-11 MED ORDER — GI COCKTAIL ~~LOC~~
30.0000 mL | Freq: Once | ORAL | Status: AC
  Administered 2016-10-11: 30 mL via ORAL
  Filled 2016-10-11: qty 30

## 2016-10-11 MED ORDER — FAMOTIDINE 20 MG PO TABS: 20.0000 mg | ORAL_TABLET | Freq: Two times a day (BID) | ORAL | 0 refills | Status: DC

## 2016-10-11 MED ORDER — PANTOPRAZOLE SODIUM 40 MG PO TBEC
80.0000 mg | DELAYED_RELEASE_TABLET | Freq: Once | ORAL | Status: AC
  Administered 2016-10-11: 80 mg via ORAL
  Filled 2016-10-11: qty 2

## 2016-10-11 MED ORDER — PANTOPRAZOLE SODIUM 40 MG PO TBEC: 40.0000 mg | DELAYED_RELEASE_TABLET | Freq: Two times a day (BID) | ORAL | 0 refills | Status: DC

## 2016-10-11 MED ORDER — METOCLOPRAMIDE HCL 5 MG PO TABS: 5.0000 mg | ORAL_TABLET | Freq: Three times a day (TID) | ORAL | 0 refills | Status: DC | PRN

## 2016-10-11 MED ORDER — ONDANSETRON HCL 4 MG/2ML IJ SOLN
4.0000 mg | Freq: Once | INTRAMUSCULAR | Status: AC
  Administered 2016-10-11: 4 mg via INTRAVENOUS

## 2016-10-11 MED ORDER — ONDANSETRON HCL 4 MG/2ML IJ SOLN
INTRAMUSCULAR | Status: AC
  Administered 2016-10-11: 4 mg via INTRAVENOUS
  Filled 2016-10-11: qty 2

## 2016-10-11 NOTE — ED Notes (Signed)
Patient given extensive information regarding the medication management clinic, [facility] and [facility].  Patient reports she does not have insurance or money to fill prescriptions.  This RN explained that the medication management clinic should assist patient today to fill her prescriptions.  Patient verbalized understanding.

## 2016-10-11 NOTE — Discharge Instructions (Signed)
Please take Tylenol for pain. He can also use over-the-counter Maalox or Mylanta. Fresh prescriptions for your gastritis have been provided. Return emergency Department for fever, persistent vomiting of blood. Blood in your stool.  Please return immediately if condition worsens. Please contact her primary physician or the physician you were given for referral. If you have any specialist physicians involved in her treatment and plan please also contact them. Thank you for using [facility].

## 2016-10-11 NOTE — ED Triage Notes (Signed)
Pt arrived to ED by POV from home. Pt complains of epigastric pain, nausea, vomiting, and diarrhea . It started last night.

## 2016-10-11 NOTE — ED Provider Notes (Signed)
Time Seen: Approximately 0905  I have reviewed the triage notes  Chief Complaint: Abdominal Pain   History of Present Illness: Jasmine Mason is a 31 y.o. female  who presents with an acute exacerbation of her chronic epigastric pain. Patient was recently admitted to the hospital and was found to have persisting gastritis. She was discharged on Protonix, Pepcid, Reglan, etc. Patient states that she did not get the medication filled. She presents hypertensive and pain in the same location. He vomiting of blood this time. She denies any melena or hematochezia. She does continue to smoke. She denies any lower abdominal pain or chest discomfort.   Past Medical History:  Diagnosis Date  . Back pain   . Back pain   . GERD (gastroesophageal reflux disease)   . Hypertension   . Mesenteric adenitis   . Obesity   . Tobacco abuse     Patient Active Problem List   Diagnosis Date Noted  . Acute gastritis 09/20/2016  . Nausea with vomiting   . Hiatal hernia   . Uncontrollable vomiting 03/10/2016  . Abdominal pain, epigastric   . GERD (gastroesophageal reflux disease) 01/24/2015  . Hypokalemia 01/24/2015  . Abdominal pain 01/24/2015  . Leukocytosis 01/24/2015  . Chest pain 01/24/2015  . Cough 01/24/2015  . Pain of left calf 01/24/2015  . Elevated troponin 01/24/2015  . Obesity   . Mesenteric adenitis   . Tobacco abuse     Past Surgical History:  Procedure Laterality Date  . CARPAL TUNNEL RELEASE Left ~ 2013  . ESOPHAGOGASTRODUODENOSCOPY N/A 01/25/2015   Procedure: ESOPHAGOGASTRODUODENOSCOPY (EGD);  Surgeon: Irene Shipper, MD;  Location: [facility] ENDOSCOPY;  Service: Endoscopy;  Laterality: N/A;  . ESOPHAGOGASTRODUODENOSCOPY (EGD) WITH PROPOFOL N/A 03/13/2016   Procedure: ESOPHAGOGASTRODUODENOSCOPY (EGD) WITH PROPOFOL;  Surgeon: Lucilla Lame, MD;  Location: [facility] ENDOSCOPY;  Service: Endoscopy;  Laterality: N/A;  . FRACTURE SURGERY    . ORIF ANKLE FRACTURE Left 1990's    Past Surgical  History:  Procedure Laterality Date  . CARPAL TUNNEL RELEASE Left ~ 2013  . ESOPHAGOGASTRODUODENOSCOPY N/A 01/25/2015   Procedure: ESOPHAGOGASTRODUODENOSCOPY (EGD);  Surgeon: Irene Shipper, MD;  Location: [facility] ENDOSCOPY;  Service: Endoscopy;  Laterality: N/A;  . ESOPHAGOGASTRODUODENOSCOPY (EGD) WITH PROPOFOL N/A 03/13/2016   Procedure: ESOPHAGOGASTRODUODENOSCOPY (EGD) WITH PROPOFOL;  Surgeon: Lucilla Lame, MD;  Location: [facility] ENDOSCOPY;  Service: Endoscopy;  Laterality: N/A;  . FRACTURE SURGERY    . ORIF ANKLE FRACTURE Left 1990's    Current Outpatient Rx  . Order #: [id number] Class: Print  . Order #: [id number] Class: Print  . Order #: [id number] Class: Print  . Order #: [id number] Class: Print  . Order #: [id number] Class: Print  . Order #: [id number] Class: Print    Allergies:  Patient has no known allergies.  Family History: Family History  Problem Relation Age of Onset  . Hypertension Mother   . Hypertension Father   . Diabetes Father   . Heart disease Father   . Hypertension Sister     Social History: Social History  Substance Use Topics  . Smoking status: Current Every Day Smoker    Packs/day: 0.25    Years: 10.00    Types: Cigarettes  . Smokeless tobacco: Never Used  . Alcohol use No     Review of Systems:   10 point review of systems was performed and was otherwise negative:  Constitutional: No fever Eyes: No visual disturbances ENT: No sore throat, ear pain Cardiac: No chest pain Respiratory: No shortness of breath, wheezing, or  stridor Abdomen:Pain epigastric left upper quadrant without any radiation to the back or flank area. Endocrine: No weight loss, No night sweats Extremities: No peripheral edema, cyanosis Skin: No rashes, easy bruising Neurologic: No focal weakness, trouble with speech or swollowing Urologic: No dysuria, Hematuria, or urinary frequency Patient denies any risk of being pregnant  Physical Exam:  ED Triage Vitals  Enc Vitals Group     BP  10/11/16 0911 (!) 191/112     Pulse Rate 10/11/16 0911 77     Resp --      Temp 10/11/16 0911 98.7 F (37.1 C)     Temp Source 10/11/16 0911 Oral     SpO2 10/11/16 0911 100 %     Weight 10/11/16 0913 212 lb (96.2 kg)     Height 10/11/16 0913 5\' 7"  (1.702 m)     Head Circumference --      Peak Flow --      Pain Score 10/11/16 0916 8     Pain Loc --      Pain Edu? --      Excl. in [place]? --     General: Awake , Alert , and Oriented times 3; GCS 15 Head: Normal cephalic , atraumatic Eyes: Pupils equal , round, reactive to light Nose/Throat: No nasal drainage, patent upper airway without erythema or exudate.  Neck: Supple, Full range of motion, No anterior adenopathy or palpable thyroid masses Lungs: Clear to ascultation without wheezes , rhonchi, or rales Heart: Regular rate, regular rhythm without murmurs , gallops , or rubs Abdomen: Pain epigastric left upper quadrant. No peritoneal signs. No reproducible lower abdominal pain.      Extremities: 2 plus symmetric pulses. No edema, clubbing or cyanosis Neurologic: normal ambulation, Motor symmetric without deficits, sensory intact Skin: warm, dry, no rashes   Labs:   All laboratory work was reviewed including any pertinent negatives or positives listed below:  Labs Reviewed  CBC WITH DIFFERENTIAL/PLATELET  COMPREHENSIVE METABOLIC PANEL  LIPASE, BLOOD  Laboratory work was reviewed and showed no clinically significant abnormalities. Patient appears that the red blood cell count is chronically elevated   Radiology:  Patient had an ultrasound of the right upper quadrant which showed no evidence of acute cholecystitis I personally reviewed the radiologic studies   ED Course:  Given the patient's past medical history and current clinical presentation I felt she was having exacerbation of her chronic gastritis. She also has a long history of noncompliance and this is her ninth visit for similar complaints over the last 6 months.  Patient had an extensive evaluation earlier this month and did not get her prescriptions filled which included Protonix, etc. She was given financial information on location and how to get her prescriptions filled. She was advised to stop smoking and use Tylenol for pain.  Assessment:  Acute exacerbation of chronic gastritis   Final Clinical Impression:   Final diagnoses:  Acute epigastric pain     Plan: * Outpatient Patient was advised to return immediately if condition worsens. Patient was advised to follow up with their primary care physician or other specialized physicians involved in their outpatient care. The patient and/or family member/power of attorney had laboratory results reviewed at the bedside. All questions and concerns were addressed and appropriate discharge instructions were distributed by the nursing staff.             Daymon Larsen, MD 10/11/16 1145

## 2016-10-17 ENCOUNTER — Other Ambulatory Visit: Payer: Self-pay

## 2016-10-17 ENCOUNTER — Ambulatory Visit: Payer: Self-pay | Admitting: Oncology

## 2016-10-31 ENCOUNTER — Ambulatory Visit: Payer: Self-pay | Admitting: Pharmacy Technician

## 2016-10-31 NOTE — Progress Notes (Signed)
Patient scheduled for [facility].  Patient did not show for the appointment on 10/31/16 at 10:30a.m.  Patient did not reschedule eligibility appointment.  [facility] unable to provide ongoing medication assistance until eligibility is determined.  [facility]

## 2016-11-23 ENCOUNTER — Encounter: Payer: Self-pay | Admitting: Emergency Medicine

## 2016-11-23 ENCOUNTER — Emergency Department
Admission: EM | Admit: 2016-11-23 | Discharge: 2016-11-23 | Disposition: A | Discharge status: Home / Self Care | Payer: Self-pay | Attending: Emergency Medicine | Admitting: Emergency Medicine

## 2016-11-23 DIAGNOSIS — J0100 Acute maxillary sinusitis, unspecified: Secondary | ICD-10-CM | POA: Insufficient documentation

## 2016-11-23 DIAGNOSIS — F17210 Nicotine dependence, cigarettes, uncomplicated: Secondary | ICD-10-CM | POA: Insufficient documentation

## 2016-11-23 DIAGNOSIS — I10 Essential (primary) hypertension: Secondary | ICD-10-CM | POA: Insufficient documentation

## 2016-11-23 LAB — POCT RAPID STREP A: Streptococcus, Group A Screen (Direct): NEGATIVE

## 2016-11-23 MED ORDER — AMOXICILLIN-POT CLAVULANATE 875-125 MG PO TABS
1.0000 | ORAL_TABLET | Freq: Once | ORAL | Status: AC
  Administered 2016-11-23: 1 via ORAL
  Filled 2016-11-23: qty 1

## 2016-11-23 MED ORDER — KETOROLAC TROMETHAMINE 10 MG PO TABS
10.0000 mg | ORAL_TABLET | Freq: Once | ORAL | Status: AC
  Administered 2016-11-23: 10 mg via ORAL
  Filled 2016-11-23: qty 1

## 2016-11-23 MED ORDER — AMOXICILLIN-POT CLAVULANATE 875-125 MG PO TABS: 1.0000 | ORAL_TABLET | Freq: Two times a day (BID) | ORAL | 0 refills | Status: AC

## 2016-11-23 NOTE — ED Provider Notes (Signed)
[facility] Emergency Department Provider Note    First MD Initiated Contact with Patient 11/23/16 [phone]     (approximate)  I have reviewed the triage vital signs and the nursing notes.   HISTORY  Chief Complaint Headache; Nasal Congestion; and Sore Throat   HPI Jasmine Mason is a 31 y.o. female with below list of current medical conditions presents with nasal congestion frontal headache sore throat and nonproductive cough 2 days. Patient denies any fever or febrile on presentation. Patient denies any known sick contacts    Past Medical History:  Diagnosis Date  . Back pain   . Back pain   . GERD (gastroesophageal reflux disease)   . Hypertension   . Mesenteric adenitis   . Obesity   . Tobacco abuse     Patient Active Problem List   Diagnosis Date Noted  . Acute gastritis 09/20/2016  . Nausea with vomiting   . Hiatal hernia   . Uncontrollable vomiting 03/10/2016  . Abdominal pain, epigastric   . GERD (gastroesophageal reflux disease) 01/24/2015  . Hypokalemia 01/24/2015  . Abdominal pain 01/24/2015  . Leukocytosis 01/24/2015  . Chest pain 01/24/2015  . Cough 01/24/2015  . Pain of left calf 01/24/2015  . Elevated troponin 01/24/2015  . Obesity   . Mesenteric adenitis   . Tobacco abuse     Past Surgical History:  Procedure Laterality Date  . CARPAL TUNNEL RELEASE Left ~ 2013  . ESOPHAGOGASTRODUODENOSCOPY N/A 01/25/2015   Procedure: ESOPHAGOGASTRODUODENOSCOPY (EGD);  Surgeon: Irene Shipper, MD;  Location: [facility] ENDOSCOPY;  Service: Endoscopy;  Laterality: N/A;  . ESOPHAGOGASTRODUODENOSCOPY (EGD) WITH PROPOFOL N/A 03/13/2016   Procedure: ESOPHAGOGASTRODUODENOSCOPY (EGD) WITH PROPOFOL;  Surgeon: Lucilla Lame, MD;  Location: [facility] ENDOSCOPY;  Service: Endoscopy;  Laterality: N/A;  . FRACTURE SURGERY    . ORIF ANKLE FRACTURE Left 1990's    Prior to Admission medications   Medication Sig Start Date End Date Taking? Authorizing Provider    famotidine (PEPCID) 20 MG tablet Take 1 tablet (20 mg total) by mouth 2 (two) times daily. 10/11/16   Daymon Larsen, MD  HYDROcodone-acetaminophen (NORCO/VICODIN) 5-325 MG tablet Take 1 tablet by mouth every 6 (six) hours as needed for moderate pain or severe pain. Patient not taking: Reported on 09/28/2016 09/21/16   Vaughan Basta, MD  lidocaine (LIDODERM) 5 % Place 1 patch onto the skin daily. Patient not taking: Reported on 08/28/2016 06/12/16   Daymon Larsen, MD  metoCLOPramide (REGLAN) 5 MG tablet Take 1 tablet (5 mg total) by mouth every 8 (eight) hours as needed for nausea or vomiting. 10/11/16   Daymon Larsen, MD  pantoprazole (PROTONIX) 40 MG tablet Take 1 tablet (40 mg total) by mouth 2 (two) times daily. 10/11/16 11/10/16  Daymon Larsen, MD    Allergies No Known Drug Allergies  Family History  Problem Relation Age of Onset  . Hypertension Mother   . Hypertension Father   . Diabetes Father   . Heart disease Father   . Hypertension Sister     Social History Social History  Substance Use Topics  . Smoking status: Current Every Day Smoker    Packs/day: 0.25    Years: 10.00    Types: Cigarettes  . Smokeless tobacco: Never Used  . Alcohol use No    Review of Systems Constitutional: No fever/chills Eyes: No visual changes. ENT: No sore throat. Cardiovascular: Denies chest pain. Respiratory: Denies shortness of breath. Gastrointestinal: No abdominal pain.  No nausea,  no vomiting.  No diarrhea.  No constipation. Genitourinary: Negative for dysuria. Musculoskeletal: Negative for back pain. Integumentary: Negative for rash. Neurological: Negative for headaches, focal weakness or numbness.   ____________________________________________   PHYSICAL EXAM:  VITAL SIGNS: ED Triage Vitals [11/23/16 0336]  Enc Vitals Group     BP (!) 164/94     Pulse Rate 89     Resp 18     Temp 98.8 F (37.1 C)     Temp Source Oral     SpO2 99 %     Weight 212 lb (96.2 kg)      Height 5\' 7"  (1.702 m)     Head Circumference      Peak Flow      Pain Score 7     Pain Loc      Pain Edu?      Excl. in [place]?     Constitutional: Alert and oriented. Well appearing and in no acute distress. Eyes: Conjunctivae are normal. PERRL. EOMI. Head: Atraumatic.Pain with maxillary sinus percussion Ears:  Healthy appearing ear canals and TMs bilaterally Nose: No congestion/rhinnorhea. Mouth/Throat: Mucous membranes are moist.  Oropharynx non-erythematous. Neck: No stridor.   Cardiovascular: Normal rate, regular rhythm. Good peripheral circulation. Grossly normal heart sounds. Respiratory: Normal respiratory effort.  No retractions. Lungs CTAB. Gastrointestinal: Soft and nontender. No distention.  Musculoskeletal: No lower extremity tenderness nor edema. No gross deformities of extremities. Neurologic:  Normal speech and language. No gross focal neurologic deficits are appreciated.  Skin:  Skin is warm, dry and intact. No rash noted. Psychiatric: Mood and affect are normal. Speech and behavior are normal.  ____________________________________________   LABS (all labs ordered are listed, but only abnormal results are displayed)  Labs Reviewed  CULTURE, GROUP A STREP [facility])  POCT RAPID STREP A    Procedures   ____________________________________________   INITIAL IMPRESSION / ASSESSMENT AND PLAN / ED COURSE  Pertinent labs & imaging results that were available during my care of the patient were reviewed by me and considered in my medical decision making (see chart for details).  Patient given Augmentin 875 mg we prescribed same for home.      ____________________________________________  FINAL CLINICAL IMPRESSION(S) / ED DIAGNOSES  Final diagnoses:  Acute non-recurrent maxillary sinusitis     MEDICATIONS GIVEN DURING THIS VISIT:  Medications  amoxicillin-clavulanate (AUGMENTIN) 875-125 MG per tablet 1 tablet (1 tablet Oral Given 11/23/16 0601)   ketorolac (TORADOL) tablet 10 mg (10 mg Oral Given 11/23/16 0601)     NEW OUTPATIENT MEDICATIONS STARTED DURING THIS VISIT:  New Prescriptions   No medications on file    Modified Medications   No medications on file    Discontinued Medications   No medications on file     Note:  This document was prepared using Dragon voice recognition software and may include unintentional dictation errors.    Gregor Hams, MD 11/23/16 [phone]

## 2016-11-23 NOTE — ED Triage Notes (Signed)
Pt ambulatory to triage in NAD, report sinus congestion, headache, sore throat, and cough over past couple days.  Pt reports taking ibuprofen and benadryl at home w/o relief.

## 2016-11-23 NOTE — ED Notes (Signed)
Patient c/o headache, sore throat, productive cough and sinus congestion/drainage X 3 days.

## 2016-11-23 NOTE — ED Notes (Signed)
Reviewed d/c instructions, follow-up care, prescription with patient. Pt verbalized understanding. Pt given work note with MD Owens Shark approval.

## 2016-11-25 LAB — CULTURE, GROUP A STREP (THRC)

## 2016-11-26 ENCOUNTER — Encounter: Payer: Self-pay | Admitting: Emergency Medicine

## 2016-11-26 ENCOUNTER — Emergency Department
Admission: EM | Admit: 2016-11-26 | Discharge: 2016-11-26 | Disposition: A | Discharge status: Home / Self Care | Payer: Self-pay | Attending: Emergency Medicine | Admitting: Emergency Medicine

## 2016-11-26 ENCOUNTER — Emergency Department: Payer: Self-pay

## 2016-11-26 DIAGNOSIS — G8929 Other chronic pain: Secondary | ICD-10-CM | POA: Insufficient documentation

## 2016-11-26 DIAGNOSIS — R112 Nausea with vomiting, unspecified: Secondary | ICD-10-CM | POA: Insufficient documentation

## 2016-11-26 DIAGNOSIS — F17210 Nicotine dependence, cigarettes, uncomplicated: Secondary | ICD-10-CM | POA: Insufficient documentation

## 2016-11-26 DIAGNOSIS — R1013 Epigastric pain: Secondary | ICD-10-CM | POA: Insufficient documentation

## 2016-11-26 DIAGNOSIS — Z79899 Other long term (current) drug therapy: Secondary | ICD-10-CM | POA: Insufficient documentation

## 2016-11-26 DIAGNOSIS — I10 Essential (primary) hypertension: Secondary | ICD-10-CM | POA: Insufficient documentation

## 2016-11-26 DIAGNOSIS — R109 Unspecified abdominal pain: Secondary | ICD-10-CM

## 2016-11-26 LAB — COMPREHENSIVE METABOLIC PANEL
ALK PHOS: 101 U/L (ref 38–126)
ALT: 22 U/L (ref 14–54)
AST: 20 U/L (ref 15–41)
Albumin: 4.7 g/dL (ref 3.5–5.0)
Anion gap: 14 (ref 5–15)
BILIRUBIN TOTAL: 1 mg/dL (ref 0.3–1.2)
BUN: 10 mg/dL (ref 6–20)
CALCIUM: 9.5 mg/dL (ref 8.9–10.3)
CO2: 25 mmol/L (ref 22–32)
CREATININE: 0.65 mg/dL (ref 0.44–1.00)
Chloride: 101 mmol/L (ref 101–111)
Glucose, Bld: 106 mg/dL — ABNORMAL HIGH (ref 65–99)
Potassium: 3.1 mmol/L — ABNORMAL LOW (ref 3.5–5.1)
Sodium: 140 mmol/L (ref 135–145)
TOTAL PROTEIN: 8.4 g/dL — AB (ref 6.5–8.1)

## 2016-11-26 LAB — CBC
HEMATOCRIT: 44.8 % (ref 35.0–47.0)
HEMOGLOBIN: 14.9 g/dL (ref 12.0–16.0)
MCH: 28.8 pg (ref 26.0–34.0)
MCHC: 33.2 g/dL (ref 32.0–36.0)
MCV: 86.7 fL (ref 80.0–100.0)
Platelets: 382 10*3/uL (ref 150–440)
RBC: 5.17 MIL/uL (ref 3.80–5.20)
RDW: 13.8 % (ref 11.5–14.5)
WBC: 21.2 10*3/uL — ABNORMAL HIGH (ref 3.6–11.0)

## 2016-11-26 LAB — URINALYSIS, COMPLETE (UACMP) WITH MICROSCOPIC
Bilirubin Urine: NEGATIVE
GLUCOSE, UA: NEGATIVE mg/dL
Ketones, ur: 20 mg/dL — AB
LEUKOCYTES UA: NEGATIVE
NITRITE: NEGATIVE
PH: 5 (ref 5.0–8.0)
PROTEIN: 100 mg/dL — AB
SPECIFIC GRAVITY, URINE: 1.016 (ref 1.005–1.030)

## 2016-11-26 LAB — LIPASE, BLOOD: Lipase: 18 U/L (ref 11–51)

## 2016-11-26 LAB — POCT PREGNANCY, URINE: Preg Test, Ur: NEGATIVE

## 2016-11-26 MED ORDER — SODIUM CHLORIDE 0.9 % IV BOLUS (SEPSIS)
1000.0000 mL | Freq: Once | INTRAVENOUS | Status: AC
  Administered 2016-11-26: 1000 mL via INTRAVENOUS

## 2016-11-26 MED ORDER — GI COCKTAIL ~~LOC~~
30.0000 mL | Freq: Once | ORAL | Status: AC
  Administered 2016-11-26: 30 mL via ORAL
  Filled 2016-11-26: qty 30

## 2016-11-26 MED ORDER — MORPHINE SULFATE (PF) 4 MG/ML IV SOLN
4.0000 mg | Freq: Once | INTRAVENOUS | Status: AC
Start: 2016-11-26 — End: 2016-11-26
  Administered 2016-11-26: 4 mg via INTRAVENOUS
  Filled 2016-11-26: qty 1

## 2016-11-26 MED ORDER — ONDANSETRON HCL 4 MG/2ML IJ SOLN
4.0000 mg | Freq: Once | INTRAMUSCULAR | Status: AC
  Administered 2016-11-26: 4 mg via INTRAVENOUS
  Filled 2016-11-26: qty 2

## 2016-11-26 MED ORDER — PROMETHAZINE HCL 25 MG RE SUPP: 25.0000 mg | Freq: Four times a day (QID) | RECTAL | 1 refills | Status: DC | PRN

## 2016-11-26 MED ORDER — ONDANSETRON 4 MG PO TBDP
4.0000 mg | ORAL_TABLET | Freq: Once | ORAL | Status: AC | PRN
  Administered 2016-11-26: 4 mg via ORAL
  Filled 2016-11-26: qty 1

## 2016-11-26 NOTE — ED Notes (Signed)
Patient transported to Ultrasound 

## 2016-11-26 NOTE — ED Notes (Signed)
Pt alert and oriented X4, active, cooperative, pt in NAD. RR even and unlabored, color WNL.  Pt informed to return if any life threatening symptoms occur.   

## 2016-11-26 NOTE — ED Provider Notes (Addendum)
[facility] Emergency Department Provider Note  ____________________________________________   I have reviewed the triage vital signs and the nursing notes.   HISTORY  Chief Complaint Emesis    HPI Jasmine Mason is a 31 y.o. female who presents today complaining of vomiting and diarrhea. Patient is epigastric abdominal pain. She has a history of gastroparesis. She has been here the past with a similar complaint. She does have a history of mild sludge in the gallbladder but otherwise no pathology, multiple different imaging. Patient states that she has had vomiting and abdominal pain for the last day or so. Denies chest pain or shortness of breath. Apparently she told someone in the waiting room that she was having blurry vision which she was not brought back immediately despite multiple different other very sick patients in the department but she denied blurred vision to me. She has no headache. She denies diarrhea. This is the same chronic of bowel pain she has had for years. He does have a history of hypertension she has not taking her medications..     Past Medical History:  Diagnosis Date  . Back pain   . Back pain   . GERD (gastroesophageal reflux disease)   . Hypertension   . Mesenteric adenitis   . Obesity   . Tobacco abuse     Patient Active Problem List   Diagnosis Date Noted  . Acute gastritis 09/20/2016  . Nausea with vomiting   . Hiatal hernia   . Uncontrollable vomiting 03/10/2016  . Abdominal pain, epigastric   . GERD (gastroesophageal reflux disease) 01/24/2015  . Hypokalemia 01/24/2015  . Abdominal pain 01/24/2015  . Leukocytosis 01/24/2015  . Chest pain 01/24/2015  . Cough 01/24/2015  . Pain of left calf 01/24/2015  . Elevated troponin 01/24/2015  . Obesity   . Mesenteric adenitis   . Tobacco abuse     Past Surgical History:  Procedure Laterality Date  . CARPAL TUNNEL RELEASE Left ~ 2013  . ESOPHAGOGASTRODUODENOSCOPY N/A  01/25/2015   Procedure: ESOPHAGOGASTRODUODENOSCOPY (EGD);  Surgeon: Irene Shipper, MD;  Location: [facility] ENDOSCOPY;  Service: Endoscopy;  Laterality: N/A;  . ESOPHAGOGASTRODUODENOSCOPY (EGD) WITH PROPOFOL N/A 03/13/2016   Procedure: ESOPHAGOGASTRODUODENOSCOPY (EGD) WITH PROPOFOL;  Surgeon: Lucilla Lame, MD;  Location: [facility] ENDOSCOPY;  Service: Endoscopy;  Laterality: N/A;  . FRACTURE SURGERY    . ORIF ANKLE FRACTURE Left 1990's    Prior to Admission medications   Medication Sig Start Date End Date Taking? Authorizing Provider  amoxicillin-clavulanate (AUGMENTIN) 875-125 MG tablet Take 1 tablet by mouth every 12 (twelve) hours. 11/23/16 12/03/16  Gregor Hams, MD  famotidine (PEPCID) 20 MG tablet Take 1 tablet (20 mg total) by mouth 2 (two) times daily. 10/11/16   Daymon Larsen, MD  HYDROcodone-acetaminophen (NORCO/VICODIN) 5-325 MG tablet Take 1 tablet by mouth every 6 (six) hours as needed for moderate pain or severe pain. Patient not taking: Reported on 09/28/2016 09/21/16   Vaughan Basta, MD  lidocaine (LIDODERM) 5 % Place 1 patch onto the skin daily. Patient not taking: Reported on 08/28/2016 06/12/16   Daymon Larsen, MD  metoCLOPramide (REGLAN) 5 MG tablet Take 1 tablet (5 mg total) by mouth every 8 (eight) hours as needed for nausea or vomiting. 10/11/16   Daymon Larsen, MD  pantoprazole (PROTONIX) 40 MG tablet Take 1 tablet (40 mg total) by mouth 2 (two) times daily. 10/11/16 11/10/16  Daymon Larsen, MD    Allergies Patient has no known allergies.  Family History  Problem Relation Age of Onset  . Hypertension Mother   . Hypertension Father   . Diabetes Father   . Heart disease Father   . Hypertension Sister     Social History Social History  Substance Use Topics  . Smoking status: Current Every Day Smoker    Packs/day: 0.25    Years: 10.00    Types: Cigarettes  . Smokeless tobacco: Never Used  . Alcohol use No    Review of Systems Constitutional: No  fever/chills Eyes: No visual changes. ENT: No sore throat. No stiff neck no neck pain Cardiovascular: Denies chest pain. Respiratory: Denies shortness of breath. Gastrointestinal:   + vomiting.  No diarrhea.  No constipation. Genitourinary: Negative for dysuria. Musculoskeletal: Negative lower extremity swelling Skin: Negative for rash. Neurological: Negative for severe headaches, focal weakness or numbness. 10-point ROS otherwise negative.  ____________________________________________   PHYSICAL EXAM:  VITAL SIGNS: ED Triage Vitals [11/26/16 0928]  Enc Vitals Group     BP (!) 173/119     Pulse Rate 93     Resp 17     Temp 99.1 F (37.3 C)     Temp Source Oral     SpO2 99 %     Weight 212 lb (96.2 kg)     Height 5\' 7"  (1.702 m)     Head Circumference      Peak Flow      Pain Score 8     Pain Loc      Pain Edu?      Excl. in [place]?     Constitutional: Alert and oriented. Well appearing and in no acute distress.Anxious and a little upset Eyes: Conjunctivae are normal. PERRL. EOMI. Head: Atraumatic. Nose: No congestion/rhinnorhea. Mouth/Throat: Mucous membranes are moist.  Oropharynx non-erythematous. Neck: No stridor.   Nontender with no meningismus Cardiovascular: Normal rate, regular rhythm. Grossly normal heart sounds.  Good peripheral circulation. Respiratory: Normal respiratory effort.  No retractions. Lungs CTAB. Abdominal: Soft and with obesity noted.Significant tenderness minimal times palpation in epigastric right upper quadrant, no guarding or rebound. Distractible exam.. No distention. Back:  There is no focal tenderness or step off.  there is no midline tenderness there are no lesions noted. there is no CVA tenderness Musculoskeletal: No lower extremity tenderness, no upper extremity tenderness. No joint effusions, no DVT signs strong distal pulses no edema Neurologic:  Normal speech and language. No gross focal neurologic deficits are appreciated.  Skin:  Skin  is warm, dry and intact. No rash noted. Psychiatric: Mood and affect are normal. Speech and behavior are normal.  ____________________________________________   LABS (all labs ordered are listed, but only abnormal results are displayed)  Labs Reviewed  URINALYSIS, COMPLETE (UACMP) WITH MICROSCOPIC - Abnormal; Notable for the following:       Result Value   Color, Urine YELLOW (*)    APPearance HAZY (*)    Hgb urine dipstick MODERATE (*)    Ketones, ur 20 (*)    Protein, ur 100 (*)    Bacteria, UA RARE (*)    Squamous Epithelial / LPF 6-30 (*)    All other components within normal limits  CBC - Abnormal; Notable for the following:    WBC 21.2 (*)    All other components within normal limits  COMPREHENSIVE METABOLIC PANEL - Abnormal; Notable for the following:    Potassium 3.1 (*)    Glucose, Bld 106 (*)    Total Protein 8.4 (*)    All  other components within normal limits  LIPASE, BLOOD  POC URINE PREG, ED  POCT PREGNANCY, URINE   ____________________________________________  EKG  I personally interpreted any EKGs ordered by me or triage  ____________________________________________  RADIOLOGY  I reviewed any imaging ordered by me or triage that were performed during my shift and, if possible, patient and/or family made aware of any abnormal findings. ____________________________________________   PROCEDURES  Procedure(s) performed: None  Procedures  Critical Care performed: None  ____________________________________________   INITIAL IMPRESSION / ASSESSMENT AND PLAN / ED COURSE  Pertinent labs & imaging results that were available during my care of the patient were reviewed by me and considered in my medical decision making (see chart for details).   Patient here with acute exacerbation of chronic epigastric abdominal pain. Blood work and other findings are reassuring. Patient has a history of noncompliance. There is no hematemesis no melena bright red  blood per rectum. Continued tobacco abuse is noted. Patient has no lower abdominal pain or discomfort to suggest PID or appendicitis. She does have an elevated white count this is not unusual for her periods ultrasound is reassuring. Belly is nonsurgical. She has had 7 CT scans for similar complaints since 2016 this facility as well as CT scans in 2011, 2012, 2013 2015 and 2016 at other facilities. In total, patient is actually had more than 12 CT scans for similar complaints. 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 Obviously, in a patient with recurrent abdominal pain repeat CT scan is likely not in the patient's best interest. Human with elevated white count. The chance of finding something given her exam is very small. The chance of giving her cancer after radiation for multiple imaging tests is not insignificant. Blood pressure is reassuring nothing to suggest ACS PE or dissection, she has no chest pain. Just abdominal discomfort which is chronic. She is feeling better we are trying a by mouth challenge. I think at this time the better part of valor for this patient would indicate discharged with a symptomatically control and close outpatient follow-up with return precautions. Patient is feeling better after fluids and nausea medication.   ----------------------------------------- 2:32 PM on 11/26/2016 -----------------------------------------  Patient declines any further workup she states she wants to go home blood pressures elevated but it normally is. She will follow closely as an outpatient. I don't think this is any evidence of other acute pathology today. Abdomen is completely benign tolerating by mouth her only complaint right now is that she wants more juice. She also wants Phenergan to go home with which we will provide. Return progresses and follow-up given and understood ____________________________________________   FINAL CLINICAL  IMPRESSION(S) / ED DIAGNOSES  Final diagnoses:  Abdominal pain      This chart was dictated using voice recognition software.  Despite best efforts to proofread,  errors can occur which can change meaning.      Schuyler Amor, MD 11/26/16 1401    Schuyler Amor, MD 11/26/16 [phone]

## 2016-11-26 NOTE — ED Notes (Signed)
Pt able to keep crackers and juice down without emesis.

## 2016-11-26 NOTE — ED Triage Notes (Signed)
Pt reports emesis since 1400 yesterday, reports taking protonix and nausea medication without relief.

## 2016-11-26 NOTE — ED Notes (Signed)
Spoke to Dr. Jimmye Norman regarding patient's condition and blurry vision.  MD states that he does not feel patient needs new orders at this time.

## 2016-11-26 NOTE — ED Notes (Signed)
Pt given drink and crackers for PO challenge  

## 2016-11-26 NOTE — ED Notes (Addendum)
Attempted to draw blood x2 in triage, light green tube sent, unable to obtain more blood at this time. Pt hysterically crying in triage, refusing blood draw at this time.

## 2016-11-26 NOTE — ED Notes (Addendum)
Patient ambulatory to first nurse desk.  Patient states, "my vision is starting to get blurry and I would like to talk to the director of nursing."  This RN called Charge Nurse to notify.

## 2017-02-15 ENCOUNTER — Encounter (HOSPITAL_BASED_OUTPATIENT_CLINIC_OR_DEPARTMENT_OTHER): Payer: Self-pay

## 2017-02-15 ENCOUNTER — Emergency Department (HOSPITAL_BASED_OUTPATIENT_CLINIC_OR_DEPARTMENT_OTHER)
Admission: EM | Admit: 2017-02-15 | Discharge: 2017-02-16 | Disposition: A | Discharge status: Home / Self Care | Payer: BLUE CROSS/BLUE SHIELD | Attending: Emergency Medicine | Admitting: Emergency Medicine

## 2017-02-15 DIAGNOSIS — R1013 Epigastric pain: Secondary | ICD-10-CM | POA: Insufficient documentation

## 2017-02-15 DIAGNOSIS — Z79899 Other long term (current) drug therapy: Secondary | ICD-10-CM | POA: Insufficient documentation

## 2017-02-15 DIAGNOSIS — R112 Nausea with vomiting, unspecified: Secondary | ICD-10-CM | POA: Insufficient documentation

## 2017-02-15 DIAGNOSIS — G8929 Other chronic pain: Secondary | ICD-10-CM

## 2017-02-15 DIAGNOSIS — F17210 Nicotine dependence, cigarettes, uncomplicated: Secondary | ICD-10-CM | POA: Insufficient documentation

## 2017-02-15 DIAGNOSIS — R109 Unspecified abdominal pain: Secondary | ICD-10-CM

## 2017-02-15 DIAGNOSIS — I10 Essential (primary) hypertension: Secondary | ICD-10-CM | POA: Diagnosis not present

## 2017-02-15 DIAGNOSIS — R110 Nausea: Secondary | ICD-10-CM

## 2017-02-15 LAB — URINALYSIS, ROUTINE W REFLEX MICROSCOPIC
BILIRUBIN URINE: NEGATIVE
GLUCOSE, UA: NEGATIVE mg/dL
KETONES UR: NEGATIVE mg/dL
Leukocytes, UA: NEGATIVE
Nitrite: NEGATIVE
PROTEIN: NEGATIVE mg/dL
Specific Gravity, Urine: 1.025 (ref 1.005–1.030)
pH: 5 (ref 5.0–8.0)

## 2017-02-15 LAB — URINALYSIS, MICROSCOPIC (REFLEX): WBC, UA: NONE SEEN WBC/hpf (ref 0–5)

## 2017-02-15 LAB — PREGNANCY, URINE: PREG TEST UR: NEGATIVE

## 2017-02-15 NOTE — ED Triage Notes (Signed)
Pt c/o middle abdominal pain since this morning with nausea, states she thinks it is her chronic gastritis and she ran out of her protonix

## 2017-02-15 NOTE — ED Provider Notes (Signed)
[facility] Provider Note   CSN: [id number] Arrival date & time: 02/15/17  2220     History   Chief Complaint Chief Complaint  Patient presents with  . Abdominal Pain    HPI Jasmine Mason is a 31 y.o. female with past medical history of abdominal pain, GERD, gastritis who presents with 2 days of diffuse epigastric abdominal pain. Patient also reports that since this morning she has had some nausea and vomiting. Patient reports that her abdominal pain has become more constant prompting ED visit. Patient describes it as a "ache." She states that she has a history of gastritis and that her current symptoms feel similar to her previous episodes of gastritis. Patient states that she is supposed to be on Protonix but she has not taken it in approximately one month. Patient reports that when she does not take her medications and she often has episodes of gastritis a result of her having to come to the emergency department. Patient reports that she has had 3 episodes of vomiting since onset of symptoms. Patient reports that emesis is nonbilious. She reports it looks like the food that she has been trying to eat. She reports that today she had some blood-tinged  but denies any gross blood or coffee-ground emesis. Patient's last bowel movement was this morning and was normal. No presence of blood or melena.  Patient denies any fever, chest pain, difficulty breathing, diarrhea, constipation, vaginal bleeding, vaginal discharge, dysuria, hematuria.    The history is provided by the patient.    Past Medical History:  Diagnosis Date  . Back pain   . Back pain   . GERD (gastroesophageal reflux disease)   . Hypertension   . Mesenteric adenitis   . Obesity   . Tobacco abuse     Patient Active Problem List   Diagnosis Date Noted  . Acute gastritis 09/20/2016  . Nausea with vomiting   . Hiatal hernia   . Uncontrollable vomiting 03/10/2016  . Abdominal pain, epigastric   . GERD  (gastroesophageal reflux disease) 01/24/2015  . Hypokalemia 01/24/2015  . Abdominal pain 01/24/2015  . Leukocytosis 01/24/2015  . Chest pain 01/24/2015  . Cough 01/24/2015  . Pain of left calf 01/24/2015  . Elevated troponin 01/24/2015  . Obesity   . Mesenteric adenitis   . Tobacco abuse     Past Surgical History:  Procedure Laterality Date  . CARPAL TUNNEL RELEASE Left ~ 2013  . ESOPHAGOGASTRODUODENOSCOPY N/A 01/25/2015   Procedure: ESOPHAGOGASTRODUODENOSCOPY (EGD);  Surgeon: Irene Shipper, MD;  Location: [facility] ENDOSCOPY;  Service: Endoscopy;  Laterality: N/A;  . ESOPHAGOGASTRODUODENOSCOPY (EGD) WITH PROPOFOL N/A 03/13/2016   Procedure: ESOPHAGOGASTRODUODENOSCOPY (EGD) WITH PROPOFOL;  Surgeon: Lucilla Lame, MD;  Location: [facility] ENDOSCOPY;  Service: Endoscopy;  Laterality: N/A;  . FRACTURE SURGERY    . ORIF ANKLE FRACTURE Left 1990's    OB History    No data available       Home Medications    Prior to Admission medications   Medication Sig Start Date End Date Taking? Authorizing Provider  famotidine (PEPCID) 20 MG tablet Take 1 tablet (20 mg total) by mouth 2 (two) times daily. 10/11/16   Daymon Larsen, MD  HYDROcodone-acetaminophen (NORCO/VICODIN) 5-325 MG tablet Take 1 tablet by mouth every 6 (six) hours as needed for moderate pain or severe pain. Patient not taking: Reported on 09/28/2016 09/21/16   Vaughan Basta, MD  lidocaine (LIDODERM) 5 % Place 1 patch onto the skin daily. Patient not taking:  Reported on 08/28/2016 06/12/16   Daymon Larsen, MD  metoCLOPramide (REGLAN) 5 MG tablet Take 1 tablet (5 mg total) by mouth every 8 (eight) hours as needed for nausea or vomiting. 10/11/16   Daymon Larsen, MD  pantoprazole (PROTONIX) 40 MG tablet Take 1 tablet (40 mg total) by mouth 2 (two) times daily. 10/11/16 11/10/16  Daymon Larsen, MD  promethazine (PHENERGAN) 25 MG suppository Place 1 suppository (25 mg total) rectally every 6 (six) hours as needed for nausea. 11/26/16  11/26/17  Schuyler Amor, MD    Family History Family History  Problem Relation Age of Onset  . Hypertension Mother   . Hypertension Father   . Diabetes Father   . Heart disease Father   . Hypertension Sister     Social History Social History  Substance Use Topics  . Smoking status: Current Every Day Smoker    Packs/day: 0.25    Years: 10.00    Types: Cigarettes  . Smokeless tobacco: Never Used  . Alcohol use No     Allergies   Patient has no known allergies.   Review of Systems Review of Systems  Constitutional: Negative for chills and fever.  Respiratory: Negative for cough and shortness of breath.   Cardiovascular: Negative for chest pain.  Gastrointestinal: Positive for abdominal pain, nausea and vomiting. Negative for blood in stool and diarrhea.  Genitourinary: Negative for dysuria, hematuria, vaginal bleeding and vaginal discharge.  Musculoskeletal: Negative for back pain and neck pain.  Neurological: Negative for dizziness and headaches.     Physical Exam Updated Vital Signs BP (!) 145/106 (BP Location: Left Arm)   Pulse 80   Temp 98.2 F (36.8 C) (Oral)   Resp 18   Ht 5\' 7"  (1.702 m)   Wt 96.2 kg (212 lb)   LMP 02/05/2017   SpO2 99%   BMI 33.20 kg/m   Physical Exam  Constitutional: She is oriented to person, place, and time. She appears well-developed and well-nourished.  Sitting comfortably on examination table  HENT:  Head: Normocephalic and atraumatic.  Mouth/Throat: Oropharynx is clear and moist and mucous membranes are normal.  Eyes: Pupils are equal, round, and reactive to light. Conjunctivae, EOM and lids are normal.  Neck: Full passive range of motion without pain.  Cardiovascular: Normal rate, regular rhythm, normal heart sounds and normal pulses.  Exam reveals no gallop and no friction rub.   No murmur heard. Pulmonary/Chest: Effort normal and breath sounds normal.  Abdominal: Soft. Normal appearance and bowel sounds are normal. She  exhibits no distension and no mass. There is tenderness in the epigastric area. There is no rigidity, no guarding, no CVA tenderness and no tenderness at McBurney's point.  Abdomen is soft, nondistended. Patient has mild tenderness in the epigastric region. No tenderness to the lower abdomen or McBurney's point. No RUQ tenderness.  Negative CVA tenderness bilaterally. No peritoneal signs.   Musculoskeletal: Normal range of motion.  Neurological: She is alert and oriented to person, place, and time.  Skin: Skin is warm and dry. Capillary refill takes less than 2 seconds.  Psychiatric: She has a normal mood and affect. Her speech is normal.  Nursing note and vitals reviewed.    ED Treatments / Results  Labs (all labs ordered are listed, but only abnormal results are displayed) Labs Reviewed  URINALYSIS, ROUTINE W REFLEX MICROSCOPIC - Abnormal; Notable for the following:       Result Value   Hgb urine dipstick SMALL (*)  All other components within normal limits  URINALYSIS, MICROSCOPIC (REFLEX) - Abnormal; Notable for the following:    Bacteria, UA RARE (*)    Squamous Epithelial / LPF 0-5 (*)    All other components within normal limits  PREGNANCY, URINE    EKG  EKG Interpretation None       Radiology No results found.  Procedures Procedures (including critical care time)  Medications Ordered in ED Medications - No data to display   Initial Impression / Assessment and Plan / ED Course  I have reviewed the triage vital signs and the nursing notes.  Pertinent labs & imaging results that were available during my care of the patient were reviewed by me and considered in my medical decision making (see chart for details).     31 y.o. F w/ PMH/o gastritis who presents epigastric pain, associated with nausea. Current symptoms feel like her gastritis. Patient has been noncompliant with her protonix medication. Patient is afebrile, non-toxic appearing, sitting comfortably on  examination table. Vital signs reviewed and stable. Consider gastritis vs acute infectious etiology. History/physical exam are not concerning for appendicitis or diverticulitis or hepatobilliary etiology. Basic labs ordered at triage, including CBC, BMP, UA, urine pregnancy. Plan for CT abd/pelvis for evaluation.  Analgesics and anti-emetics given in the department.   Review of records show that patient has had multiple similar visits for same symptoms. Since 2017 she has had 5 negative CT abd/pelvis. She has had 4 negative abdomen U/S.   Labs and imaging reviewed. CBC shows slight leukocytosis, likely stress reaction. Otherwise unremarkabkle. BMP is unremarkable. UA without any signs of infection. Urine pregnancy is negative. Discussed results with patient. Re-evaluation. Patient reports improvement in symptoms after medication. Repeat abdominal exam improved tenderness. No peritoneal signs. Plan to PO challenge in the department.   Patient able to tolerate PO in the department. Given improvement in symptoms, reassuring exam, unremarkable lab work, will cancel CT abd/pelvis at this time. Plan to refill patient's protonix. Provided patient with a list of clinic resources to use if he does not have a PCP. Instructed to call them today to arrange follow-up in the next 24-48 hours. Return precautions discussed. Patient expresses understanding and agreement to plan.    Final Clinical Impressions(s) / ED Diagnoses   Final diagnoses:  Chronic abdominal pain  Nausea    New Prescriptions New Prescriptions   No medications on file     Desma Mcgregor 84/53/64 [id number]    Delora Fuel, MD 21/22/48 2252

## 2017-02-16 ENCOUNTER — Emergency Department (HOSPITAL_BASED_OUTPATIENT_CLINIC_OR_DEPARTMENT_OTHER): Payer: BLUE CROSS/BLUE SHIELD

## 2017-02-16 LAB — BASIC METABOLIC PANEL
ANION GAP: 8 (ref 5–15)
BUN: 8 mg/dL (ref 6–20)
CALCIUM: 8.1 mg/dL — AB (ref 8.9–10.3)
CHLORIDE: 106 mmol/L (ref 101–111)
CO2: 23 mmol/L (ref 22–32)
Creatinine, Ser: 0.66 mg/dL (ref 0.44–1.00)
GFR calc non Af Amer: >60 mL/min (ref >60)
Glucose, Bld: 105 mg/dL — ABNORMAL HIGH (ref 65–99)
POTASSIUM: 3.7 mmol/L (ref 3.5–5.1)
Sodium: 137 mmol/L (ref 135–145)

## 2017-02-16 LAB — CBC WITH DIFFERENTIAL/PLATELET
BASOS ABS: 0 10*3/uL (ref 0.0–0.1)
BASOS PCT: 0 %
Eosinophils Absolute: 0.2 10*3/uL (ref 0.0–0.7)
Eosinophils Relative: 2 %
HEMATOCRIT: 39.6 % (ref 36.0–46.0)
HEMOGLOBIN: 13.4 g/dL (ref 12.0–15.0)
Lymphocytes Relative: 29 %
Lymphs Abs: 3.4 10*3/uL (ref 0.7–4.0)
MCH: 29.6 pg (ref 26.0–34.0)
MCHC: 33.8 g/dL (ref 30.0–36.0)
MCV: 87.4 fL (ref 78.0–100.0)
MONOS PCT: 7 %
Monocytes Absolute: 0.8 10*3/uL (ref 0.1–1.0)
NEUTROS ABS: 7.2 10*3/uL (ref 1.7–7.7)
NEUTROS PCT: 62 %
Platelets: 300 10*3/uL (ref 150–400)
RBC: 4.53 MIL/uL (ref 3.87–5.11)
RDW: 13.1 % (ref 11.5–15.5)
WBC: 11.8 10*3/uL — ABNORMAL HIGH (ref 4.0–10.5)

## 2017-02-16 MED ORDER — PANTOPRAZOLE SODIUM 40 MG PO TBEC: 40.0000 mg | DELAYED_RELEASE_TABLET | Freq: Every day | ORAL | 0 refills | Status: DC

## 2017-02-16 MED ORDER — ONDANSETRON HCL 4 MG/2ML IJ SOLN
4.0000 mg | Freq: Once | INTRAMUSCULAR | Status: AC
  Administered 2017-02-16: 4 mg via INTRAVENOUS
  Filled 2017-02-16: qty 2

## 2017-02-16 MED ORDER — ONDANSETRON HCL 4 MG PO TABS: 4.0000 mg | ORAL_TABLET | Freq: Four times a day (QID) | ORAL | 0 refills | Status: DC

## 2017-02-16 MED ORDER — SODIUM CHLORIDE 0.9 % IV BOLUS (SEPSIS)
500.0000 mL | Freq: Once | INTRAVENOUS | Status: AC
  Administered 2017-02-16: 500 mL via INTRAVENOUS

## 2017-02-16 MED ORDER — PANTOPRAZOLE SODIUM 40 MG PO TBEC
40.0000 mg | DELAYED_RELEASE_TABLET | Freq: Once | ORAL | Status: AC
  Administered 2017-02-16: 40 mg via ORAL
  Filled 2017-02-16: qty 1

## 2017-02-16 NOTE — ED Notes (Signed)
Pt given [place] for PO challenge

## 2017-02-16 NOTE — Discharge Instructions (Signed)
Take Protonix as directed.  Take Zofran as needed for nausea.  Follow-up with her primary care doctor next 24-48 hours further evaluation.  Return to the emergency department for any worsening pain, fever, persistent vomiting, chest pain, difficulty breathing, or any other worsening or concerning symptoms.

## 2017-02-21 ENCOUNTER — Ambulatory Visit (INDEPENDENT_AMBULATORY_CARE_PROVIDER_SITE_OTHER): Payer: BLUE CROSS/BLUE SHIELD | Admitting: Obstetrics and Gynecology

## 2017-02-21 ENCOUNTER — Encounter: Payer: Self-pay | Admitting: Obstetrics and Gynecology

## 2017-02-21 VITALS — BP 149/93 | HR 94 | Ht 67.0 in | Wt 225.4 lb

## 2017-02-21 DIAGNOSIS — Z0000 Encounter for general adult medical examination without abnormal findings: Secondary | ICD-10-CM

## 2017-02-21 DIAGNOSIS — N938 Other specified abnormal uterine and vaginal bleeding: Secondary | ICD-10-CM

## 2017-02-21 NOTE — Progress Notes (Signed)
HPI:      [name] is a 31 y.o. G0P0000 who LMP was Patient's last menstrual period was 02/05/2017 (approximate).  Subjective:   She presents today For her annual examination. She has not had a Pap smear in many years. She reports that she does not have regular cycles and has never had regular cycles except when on OCPs. She has never been pregnant. She is questioning if her irregular cycles should be "managed."    Hx: The following portions of the patient's history were reviewed and updated as appropriate:             She  has a past medical history of Back pain; Back pain; GERD (gastroesophageal reflux disease); Hypertension; Mesenteric adenitis; Obesity; and Tobacco abuse. She  does not have any pertinent problems on file. She  has a past surgical history that includes ORIF ankle fracture (Left, 1990's); Carpal tunnel release (Left, ~ 2013); Fracture surgery; Esophagogastroduodenoscopy (N/A, 01/25/2015); and Esophagogastroduodenoscopy (egd) with propofol (N/A, 03/13/2016). Her family history includes Diabetes in her father; Heart disease in her father; Hypertension in her father, mother, and sister. She  reports that she has been smoking Cigarettes.  She has a 2.50 pack-year smoking history. She has never used smokeless tobacco. She reports that she does not drink alcohol or use drugs. She has No Known Allergies.       Review of Systems:  Review of Systems  Constitutional: Denied constitutional symptoms, night sweats, recent illness, fatigue, fever, insomnia and weight loss.  Eyes: Denied eye symptoms, eye pain, photophobia, vision change and visual disturbance.  Ears/Nose/Throat/Neck: Denied ear, nose, throat or neck symptoms, hearing loss, nasal discharge, sinus congestion and sore throat.  Cardiovascular: Denied cardiovascular symptoms, arrhythmia, chest pain/pressure, edema, exercise intolerance, orthopnea and palpitations.  Respiratory: Denied pulmonary symptoms, asthma,  pleuritic pain, productive sputum, cough, dyspnea and wheezing.  Gastrointestinal: Denied, gastro-esophageal reflux, melena, nausea and vomiting.  Genitourinary: See HPI for additional information.  Musculoskeletal: Denied musculoskeletal symptoms, stiffness, swelling, muscle weakness and myalgia.  Dermatologic: Denied dermatology symptoms, rash and scar.  Neurologic: Denied neurology symptoms, dizziness, headache, neck pain and syncope.  Psychiatric: Denied psychiatric symptoms, anxiety and depression.  Endocrine: Denied endocrine symptoms including hot flashes and night sweats.   Meds:   Current Outpatient Prescriptions on File Prior to Visit  Medication Sig Dispense Refill  . ondansetron (ZOFRAN) 4 MG tablet Take 1 tablet (4 mg total) by mouth every 6 (six) hours. 10 tablet 0  . pantoprazole (PROTONIX) 40 MG tablet Take 1 tablet (40 mg total) by mouth daily. 20 tablet 0  . famotidine (PEPCID) 20 MG tablet Take 1 tablet (20 mg total) by mouth 2 (two) times daily. (Patient not taking: Reported on 02/21/2017) 60 tablet 0  . HYDROcodone-acetaminophen (NORCO/VICODIN) 5-325 MG tablet Take 1 tablet by mouth every 6 (six) hours as needed for moderate pain or severe pain. (Patient not taking: Reported on 09/28/2016) 10 tablet 0  . lidocaine (LIDODERM) 5 % Place 1 patch onto the skin daily. (Patient not taking: Reported on 08/28/2016) 15 patch 0  . metoCLOPramide (REGLAN) 5 MG tablet Take 1 tablet (5 mg total) by mouth every 8 (eight) hours as needed for nausea or vomiting. (Patient not taking: Reported on 02/21/2017) 30 tablet 0  . promethazine (PHENERGAN) 25 MG suppository Place 1 suppository (25 mg total) rectally every 6 (six) hours as needed for nausea. (Patient not taking: Reported on 02/21/2017) 12 suppository 1   No current facility-administered medications on file  prior to visit.     Objective:     Vitals:   02/21/17 1016  BP: (!) 149/93  Pulse: 94              Physical examination General  NAD, Conversant  HEENT Atraumatic; Op clear with mmm.  Normo-cephalic. Pupils reactive. Anicteric sclerae  Thyroid/Neck Smooth without nodularity or enlargement. Normal ROM.  Neck Supple.  Skin No rashes, lesions or ulceration. Normal palpated skin turgor. No nodularity.  Breasts: No masses or discharge.  Symmetric.  No axillary adenopathy.  Lungs: Clear to auscultation.No rales or wheezes. Normal Respiratory effort, no retractions.  Heart: NSR.  No murmurs or rubs appreciated. No periferal edema  Abdomen: Soft.  Non-tender.  No masses.  No HSM. No hernia  Extremities: Moves all appropriately.  Normal ROM for age. No lymphadenopathy.  Neuro: Oriented to PPT.  Normal mood. Normal affect.     Pelvic:   Vulva: Normal appearance.  No lesions.  Vagina: No lesions or abnormalities noted.  Support: Normal pelvic support.  Urethra No masses tenderness or scarring.  Meatus Normal size without lesions or prolapse.  Cervix: Normal appearance.  No lesions.  Anus: Normal exam.  No lesions.  Perineum: Normal exam.  No lesions.        Bimanual   Uterus: Normal size.  Non-tender.  Mobile.  AV.  Adnexae: No masses.  Non-tender to palpation.  Cul-de-sac: Negative for abnormality.     Assessment:    G0P0000 Patient Active Problem List   Diagnosis Date Noted  . Acute gastritis 09/20/2016  . Nausea with vomiting   . Hiatal hernia   . Uncontrollable vomiting 03/10/2016  . Abdominal pain, epigastric   . GERD (gastroesophageal reflux disease) 01/24/2015  . Hypokalemia 01/24/2015  . Abdominal pain 01/24/2015  . Leukocytosis 01/24/2015  . Chest pain 01/24/2015  . Cough 01/24/2015  . Pain of left calf 01/24/2015  . Elevated troponin 01/24/2015  . Obesity   . Mesenteric adenitis   . Tobacco abuse      1. Encounter for annual physical exam   2. 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 This based on history of irregular cycles.  At the very least she has anovulatory  cycles.   Plan:            1.  PCO labs  2.  We have discussed endometrial hyperplasia and her risk because of her likely anovulation's.   3.  Basic Screening Recommendations The basic screening recommendations for asymptomatic women were discussed with the patient during her visit.  The age-appropriate recommendations were discussed with her and the rational for the tests reviewed.  When I am informed by the patient that another primary care physician has previously obtained the age-appropriate tests and they are up-to-date, only outstanding tests are ordered and referrals given as necessary.  Abnormal results of tests will be discussed with her when all of her results are completed. Pap performed   Orders Orders Placed This Encounter  Procedures  . DHEA-sulfate  . FSH/LH  . Prolactin  . Testosterone, Free, Total, SHBG  . TSH  . Insulin, fasting  . Glucose, Fasting    No orders of the defined types were placed in this encounter.       F/U  Return in about 2 weeks (around 03/07/2017).  Finis Bud, M.D. 02/21/2017 12:09 PM

## 2017-02-22 ENCOUNTER — Other Ambulatory Visit: Payer: Self-pay | Admitting: Obstetrics and Gynecology

## 2017-02-22 ENCOUNTER — Other Ambulatory Visit: Payer: BLUE CROSS/BLUE SHIELD

## 2017-02-23 LAB — TESTOSTERONE, FREE, TOTAL, SHBG
SEX HORMONE BINDING: 54.4 nmol/L (ref 24.6–122.0)
TESTOSTERONE FREE: 3.4 pg/mL (ref 0.0–4.2)
Testosterone: 80 ng/dL — ABNORMAL HIGH (ref 8–48)

## 2017-02-23 LAB — PROLACTIN: PROLACTIN: 5.2 ng/mL (ref 4.8–23.3)

## 2017-02-23 LAB — DHEA-SULFATE: DHEA-SO4: 145.3 ug/dL (ref 84.8–378.0)

## 2017-02-23 LAB — TSH: TSH: 0.4 u[IU]/mL — AB (ref 0.450–4.500)

## 2017-02-23 LAB — GLUCOSE, FASTING: GLUCOSE, PLASMA: 89 mg/dL (ref 65–99)

## 2017-02-26 LAB — HPV, LOW VOLUME (REFLEX): HPV low volume reflex: NEGATIVE

## 2017-02-26 LAB — PAP IG AND HPV HIGH-RISK: PAP SMEAR COMMENT: 0

## 2017-02-27 ENCOUNTER — Telehealth: Payer: Self-pay

## 2017-02-27 LAB — PROLACTIN

## 2017-02-27 LAB — TESTOSTERONE, FREE, TOTAL, SHBG

## 2017-02-27 LAB — GLUCOSE, FASTING

## 2017-02-27 LAB — DHEA-SULFATE

## 2017-02-27 LAB — TSH

## 2017-02-27 NOTE — Telephone Encounter (Signed)
-----   Message from Harlin Heys, MD sent at 02/27/2017 12:37 PM EDT ----- Negative Pap and HPV

## 2017-02-27 NOTE — Telephone Encounter (Signed)
Attempted to contact pt- line was picked up then hung up. Unable to leave a message.

## 2017-03-04 ENCOUNTER — Telehealth: Payer: Self-pay

## 2017-03-04 NOTE — Telephone Encounter (Signed)
-----   Message from Harlin Heys, MD sent at 02/27/2017 12:37 PM EDT ----- Negative Pap and HPV

## 2017-03-04 NOTE — Telephone Encounter (Signed)
Informed pt of neg results per provider

## 2017-03-12 ENCOUNTER — Encounter: Payer: Self-pay | Admitting: Obstetrics and Gynecology

## 2017-03-12 ENCOUNTER — Ambulatory Visit (INDEPENDENT_AMBULATORY_CARE_PROVIDER_SITE_OTHER): Payer: BLUE CROSS/BLUE SHIELD | Admitting: Obstetrics and Gynecology

## 2017-03-12 VITALS — BP 136/86 | HR 92 | Ht 67.0 in | Wt 224.4 lb

## 2017-03-12 DIAGNOSIS — N970 Female infertility associated with anovulation: Secondary | ICD-10-CM | POA: Diagnosis not present

## 2017-03-12 DIAGNOSIS — E282 Polycystic ovarian syndrome: Secondary | ICD-10-CM | POA: Diagnosis not present

## 2017-03-12 DIAGNOSIS — E281 Androgen excess: Secondary | ICD-10-CM | POA: Diagnosis not present

## 2017-03-12 DIAGNOSIS — Z720 Tobacco use: Secondary | ICD-10-CM

## 2017-03-12 MED ORDER — NORETHIN ACE-ETH ESTRAD-FE 1.5-30 MG-MCG PO TABS: 1.0000 | ORAL_TABLET | Freq: Every day | ORAL | 2 refills | Status: DC

## 2017-03-12 NOTE — Progress Notes (Signed)
HPI:      [name] is a 31 y.o. G0P0000 who LMP was No LMP recorded. Patient is not currently having periods (Reason: Irregular Periods).  Subjective:   She presents today For follow-up of her blood work for suspected anovulation possible PCO. She does continue to describe increased hair growth, sometimes difficulty sleeping, much more oily skin and usual, and centripetal weight gain. She is currently looking for cycle control and control of symptoms-she is not seeking pregnancy at this time. She does use cigarettes daily.    Hx: The following portions of the patient's history were reviewed and updated as appropriate:             She  has a past medical history of Back pain; Back pain; GERD (gastroesophageal reflux disease); Hypertension; Mesenteric adenitis; Obesity; and Tobacco abuse. She  does not have any pertinent problems on file. She  has a past surgical history that includes ORIF ankle fracture (Left, 1990's); Carpal tunnel release (Left, ~ 2013); Fracture surgery; Esophagogastroduodenoscopy (N/A, 01/25/2015); and Esophagogastroduodenoscopy (egd) with propofol (N/A, 03/13/2016). Her family history includes Diabetes in her father; Heart disease in her father; Hypertension in her father, mother, and sister. She  reports that she has been smoking Cigarettes.  She has a 2.50 pack-year smoking history. She has never used smokeless tobacco. She reports that she does not drink alcohol or use drugs. She has No Known Allergies.       Review of Systems:  Review of Systems  Constitutional: Denied constitutional symptoms, night sweats, recent illness, fatigue, fever, insomnia and weight loss.  Eyes: Denied eye symptoms, eye pain, photophobia, vision change and visual disturbance.  Ears/Nose/Throat/Neck: Denied ear, nose, throat or neck symptoms, hearing loss, nasal discharge, sinus congestion and sore throat.  Cardiovascular: Denied cardiovascular symptoms, arrhythmia, chest pain/pressure,  edema, exercise intolerance, orthopnea and palpitations.  Respiratory: Denied pulmonary symptoms, asthma, pleuritic pain, productive sputum, cough, dyspnea and wheezing.  Gastrointestinal: Denied, gastro-esophageal reflux, melena, nausea and vomiting.  Genitourinary: Denied genitourinary symptoms including symptomatic vaginal discharge, pelvic relaxation issues, and urinary complaints.  Musculoskeletal: Denied musculoskeletal symptoms, stiffness, swelling, muscle weakness and myalgia.  Dermatologic: Denied dermatology symptoms, rash and scar.  Neurologic: Denied neurology symptoms, dizziness, headache, neck pain and syncope.  Psychiatric: Denied psychiatric symptoms, anxiety and depression.  Endocrine: See HPI for additional information.   Meds:   Current Outpatient Prescriptions on File Prior to Visit  Medication Sig Dispense Refill  . famotidine (PEPCID) 20 MG tablet Take 1 tablet (20 mg total) by mouth 2 (two) times daily. (Patient not taking: Reported on 02/21/2017) 60 tablet 0  . HYDROcodone-acetaminophen (NORCO/VICODIN) 5-325 MG tablet Take 1 tablet by mouth every 6 (six) hours as needed for moderate pain or severe pain. (Patient not taking: Reported on 09/28/2016) 10 tablet 0  . lidocaine (LIDODERM) 5 % Place 1 patch onto the skin daily. (Patient not taking: Reported on 08/28/2016) 15 patch 0  . metoCLOPramide (REGLAN) 5 MG tablet Take 1 tablet (5 mg total) by mouth every 8 (eight) hours as needed for nausea or vomiting. (Patient not taking: Reported on 02/21/2017) 30 tablet 0  . ondansetron (ZOFRAN) 4 MG tablet Take 1 tablet (4 mg total) by mouth every 6 (six) hours. (Patient not taking: Reported on 03/12/2017) 10 tablet 0  . pantoprazole (PROTONIX) 40 MG tablet Take 1 tablet (40 mg total) by mouth daily. (Patient not taking: Reported on 03/12/2017) 20 tablet 0  . promethazine (PHENERGAN) 25 MG suppository Place 1 suppository (25  mg total) rectally every 6 (six) hours as needed for nausea. (Patient  not taking: Reported on 02/21/2017) 12 suppository 1   No current facility-administered medications on file prior to visit.     Objective:     Vitals:   03/12/17 0837  BP: 136/86  Pulse: 92              Recent lab work reviewed in detail with the patient.  Assessment:    G0P0000 Patient Active Problem List   Diagnosis Date Noted  . Acute gastritis 09/20/2016  . Nausea with vomiting   . Hiatal hernia   . Uncontrollable vomiting 03/10/2016  . Abdominal pain, epigastric   . GERD (gastroesophageal reflux disease) 01/24/2015  . Hypokalemia 01/24/2015  . Abdominal pain 01/24/2015  . Leukocytosis 01/24/2015  . Chest pain 01/24/2015  . Cough 01/24/2015  . Pain of left calf 01/24/2015  . Elevated troponin 01/24/2015  . Obesity   . Mesenteric adenitis   . Tobacco abuse      1. PCO (polycystic ovaries)   2. Androgen excess   3. Oligo-ovulation   4. Tobacco use     Elevated androgens associated with PCO. Patient with physiologic symptoms as well.   Low TSH with possible mild hyperthyroidism   Plan:            1.  We have discussed PCO in detail. The physiological effects both short and long-term were discussed. Different treatment scenarios were reviewed. Patient will start OCPs. OCPs The risks /benefits of OCPs have been explained to the patient in detail.  Product literature has been given to her.  I have instructed her in the use of OCPs and have given her literature reinforcing this information.  I have explained to the patient that OCPs are not as effective for birth control during the first month of use, and that another form of contraception should be used during this time.  Both first-day start and Sunday start have been explained.  The risks and benefits of each was discussed.  She has been made aware of  the fact that other medications may affect the efficacy of OCPs.  I have answered all of her questions, and I believe that she has an understanding of the  effectiveness and use of OCPs.   2.  Strongly advised to discontinue tobacco use.  The limitations on OCP use after age 3 discussed.  3.  Thyroid panel in 6 weeks - if patient remains hyperthyroid = endocrine referral. Orders Orders Placed This Encounter  Procedures  . TSH  . T4, free  . T3, free  . T3 Uptake     Meds ordered this encounter  Medications  . norethindrone-ethinyl estradiol-iron (LOESTRIN FE 1.5/30) 1.5-30 MG-MCG tablet    Sig: Take 1 tablet by mouth at bedtime.    Dispense:  1 Package    Refill:  2        F/U  Return in about 3 months (around 06/12/2017). I spent 16 minutes with this patient of which greater than 50% was spent discussing PCO, hyperthyroidism, treatments, signs and symptoms-see above.  Finis Bud, M.D. 03/12/2017 9:28 AM

## 2017-04-07 ENCOUNTER — Emergency Department: Payer: BLUE CROSS/BLUE SHIELD

## 2017-04-07 ENCOUNTER — Observation Stay
Admission: EM | Admit: 2017-04-07 | Discharge: 2017-04-09 | Disposition: A | Discharge status: Home / Self Care | Payer: BLUE CROSS/BLUE SHIELD | Attending: Internal Medicine | Admitting: Internal Medicine

## 2017-04-07 ENCOUNTER — Encounter: Payer: Self-pay | Admitting: Internal Medicine

## 2017-04-07 ENCOUNTER — Observation Stay: Payer: BLUE CROSS/BLUE SHIELD

## 2017-04-07 DIAGNOSIS — Z79899 Other long term (current) drug therapy: Secondary | ICD-10-CM | POA: Insufficient documentation

## 2017-04-07 DIAGNOSIS — R1013 Epigastric pain: Secondary | ICD-10-CM | POA: Diagnosis not present

## 2017-04-07 DIAGNOSIS — R109 Unspecified abdominal pain: Secondary | ICD-10-CM | POA: Diagnosis present

## 2017-04-07 DIAGNOSIS — I880 Nonspecific mesenteric lymphadenitis: Secondary | ICD-10-CM | POA: Insufficient documentation

## 2017-04-07 DIAGNOSIS — R1110 Vomiting, unspecified: Secondary | ICD-10-CM | POA: Diagnosis present

## 2017-04-07 DIAGNOSIS — F17210 Nicotine dependence, cigarettes, uncomplicated: Secondary | ICD-10-CM | POA: Insufficient documentation

## 2017-04-07 DIAGNOSIS — K2900 Acute gastritis without bleeding: Secondary | ICD-10-CM | POA: Diagnosis present

## 2017-04-07 DIAGNOSIS — I10 Essential (primary) hypertension: Secondary | ICD-10-CM | POA: Insufficient documentation

## 2017-04-07 DIAGNOSIS — Z6834 Body mass index (BMI) 34.0-34.9, adult: Secondary | ICD-10-CM | POA: Diagnosis not present

## 2017-04-07 DIAGNOSIS — E669 Obesity, unspecified: Secondary | ICD-10-CM | POA: Diagnosis not present

## 2017-04-07 DIAGNOSIS — K219 Gastro-esophageal reflux disease without esophagitis: Secondary | ICD-10-CM | POA: Diagnosis present

## 2017-04-07 DIAGNOSIS — R112 Nausea with vomiting, unspecified: Secondary | ICD-10-CM | POA: Diagnosis present

## 2017-04-07 LAB — COMPREHENSIVE METABOLIC PANEL
ALK PHOS: 77 U/L (ref 38–126)
ALT: 22 U/L (ref 14–54)
ANION GAP: 8 (ref 5–15)
AST: 24 U/L (ref 15–41)
Albumin: 4.3 g/dL (ref 3.5–5.0)
BUN: 10 mg/dL (ref 6–20)
CO2: 24 mmol/L (ref 22–32)
Calcium: 9.1 mg/dL (ref 8.9–10.3)
Chloride: 107 mmol/L (ref 101–111)
Creatinine, Ser: 0.61 mg/dL (ref 0.44–1.00)
GFR calc non Af Amer: >60 mL/min (ref >60)
Glucose, Bld: 144 mg/dL — ABNORMAL HIGH (ref 65–99)
POTASSIUM: 4 mmol/L (ref 3.5–5.1)
SODIUM: 139 mmol/L (ref 135–145)
Total Bilirubin: 0.6 mg/dL (ref 0.3–1.2)
Total Protein: 8.2 g/dL — ABNORMAL HIGH (ref 6.5–8.1)

## 2017-04-07 LAB — URINALYSIS, COMPLETE (UACMP) WITH MICROSCOPIC
Bilirubin Urine: NEGATIVE
GLUCOSE, UA: NEGATIVE mg/dL
HGB URINE DIPSTICK: NEGATIVE
KETONES UR: 20 mg/dL — AB
Leukocytes, UA: NEGATIVE
NITRITE: NEGATIVE
PROTEIN: 30 mg/dL — AB
Specific Gravity, Urine: 1.017 (ref 1.005–1.030)
pH: 6 (ref 5.0–8.0)

## 2017-04-07 LAB — CBC
HEMATOCRIT: 43.2 % (ref 35.0–47.0)
HEMOGLOBIN: 14.5 g/dL (ref 12.0–16.0)
MCH: 29.2 pg (ref 26.0–34.0)
MCHC: 33.5 g/dL (ref 32.0–36.0)
MCV: 87.2 fL (ref 80.0–100.0)
Platelets: 352 10*3/uL (ref 150–440)
RBC: 4.95 MIL/uL (ref 3.80–5.20)
RDW: 13.8 % (ref 11.5–14.5)
WBC: 16.7 10*3/uL — ABNORMAL HIGH (ref 3.6–11.0)

## 2017-04-07 LAB — LIPASE, BLOOD: LIPASE: 21 U/L (ref 11–51)

## 2017-04-07 LAB — PREGNANCY, URINE: PREG TEST UR: NEGATIVE

## 2017-04-07 LAB — HCG, QUANTITATIVE, PREGNANCY: hCG, Beta Chain, Quant, S: 1 m[IU]/mL (ref <5)

## 2017-04-07 MED ORDER — DOCUSATE SODIUM 100 MG PO CAPS
100.0000 mg | ORAL_CAPSULE | Freq: Two times a day (BID) | ORAL | Status: DC
  Administered 2017-04-07 – 2017-04-09 (×4): 100 mg via ORAL
  Filled 2017-04-07 (×4): qty 1

## 2017-04-07 MED ORDER — ONDANSETRON 4 MG PO TBDP
4.0000 mg | ORAL_TABLET | Freq: Once | ORAL | Status: AC | PRN
  Administered 2017-04-07: 4 mg via ORAL

## 2017-04-07 MED ORDER — METOCLOPRAMIDE HCL 5 MG/ML IJ SOLN
5.0000 mg | Freq: Four times a day (QID) | INTRAMUSCULAR | Status: DC
  Administered 2017-04-07 – 2017-04-09 (×7): 5 mg via INTRAVENOUS
  Filled 2017-04-07 (×7): qty 2

## 2017-04-07 MED ORDER — ALUM & MAG HYDROXIDE-SIMETH 200-200-20 MG/5ML PO SUSP
30.0000 mL | Freq: Three times a day (TID) | ORAL | Status: DC
  Administered 2017-04-07 – 2017-04-09 (×6): 30 mL via ORAL
  Filled 2017-04-07 (×6): qty 30

## 2017-04-07 MED ORDER — ONDANSETRON HCL 4 MG/2ML IJ SOLN
4.0000 mg | Freq: Four times a day (QID) | INTRAMUSCULAR | Status: DC | PRN
  Administered 2017-04-09: 4 mg via INTRAVENOUS
  Filled 2017-04-07: qty 2

## 2017-04-07 MED ORDER — IOPAMIDOL (ISOVUE-300) INJECTION 61%
100.0000 mL | Freq: Once | INTRAVENOUS | Status: AC | PRN
  Administered 2017-04-07: 100 mL via INTRAVENOUS

## 2017-04-07 MED ORDER — MORPHINE SULFATE (PF) 4 MG/ML IV SOLN
4.0000 mg | Freq: Once | INTRAVENOUS | Status: AC
  Administered 2017-04-07: 4 mg via INTRAVENOUS
  Filled 2017-04-07: qty 1

## 2017-04-07 MED ORDER — PIPERACILLIN-TAZOBACTAM 3.375 G IVPB
3.3750 g | Freq: Three times a day (TID) | INTRAVENOUS | Status: DC
  Administered 2017-04-07 – 2017-04-08 (×2): 3.375 g via INTRAVENOUS
  Filled 2017-04-07 (×2): qty 50

## 2017-04-07 MED ORDER — ONDANSETRON HCL 4 MG PO TABS: 4.0000 mg | ORAL_TABLET | Freq: Four times a day (QID) | ORAL | Status: DC | PRN

## 2017-04-07 MED ORDER — HYDROMORPHONE HCL 1 MG/ML IJ SOLN
1.0000 mg | INTRAMUSCULAR | Status: DC | PRN
  Administered 2017-04-07 – 2017-04-08 (×3): 1 mg via INTRAVENOUS
  Filled 2017-04-07 (×3): qty 1

## 2017-04-07 MED ORDER — NICOTINE 21 MG/24HR TD PT24
21.0000 mg | MEDICATED_PATCH | Freq: Every day | TRANSDERMAL | Status: DC
  Administered 2017-04-07 – 2017-04-09 (×3): 21 mg via TRANSDERMAL
  Filled 2017-04-07 (×3): qty 1

## 2017-04-07 MED ORDER — SODIUM CHLORIDE 0.9 % IV SOLN
INTRAVENOUS | Status: DC
  Administered 2017-04-07 – 2017-04-08 (×3): via INTRAVENOUS

## 2017-04-07 MED ORDER — NORETHIN ACE-ETH ESTRAD-FE 1.5-30 MG-MCG PO TABS: 1.0000 | ORAL_TABLET | Freq: Every day | ORAL | Status: DC

## 2017-04-07 MED ORDER — PANTOPRAZOLE SODIUM 40 MG IV SOLR
40.0000 mg | Freq: Once | INTRAVENOUS | Status: AC
  Administered 2017-04-07: 40 mg via INTRAVENOUS
  Filled 2017-04-07: qty 40

## 2017-04-07 MED ORDER — HEPARIN SODIUM (PORCINE) 5000 UNIT/ML IJ SOLN
5000.0000 [IU] | Freq: Three times a day (TID) | INTRAMUSCULAR | Status: DC
  Administered 2017-04-07 – 2017-04-09 (×5): 5000 [IU] via SUBCUTANEOUS
  Filled 2017-04-07 (×5): qty 1

## 2017-04-07 MED ORDER — ACETAMINOPHEN 650 MG RE SUPP: 650.0000 mg | Freq: Four times a day (QID) | RECTAL | Status: DC | PRN

## 2017-04-07 MED ORDER — ACETAMINOPHEN 325 MG PO TABS: 650.0000 mg | ORAL_TABLET | Freq: Four times a day (QID) | ORAL | Status: DC | PRN

## 2017-04-07 MED ORDER — IOPAMIDOL (ISOVUE-300) INJECTION 61%
30.0000 mL | Freq: Once | INTRAVENOUS | Status: AC | PRN
  Administered 2017-04-07: 30 mL via ORAL

## 2017-04-07 MED ORDER — SODIUM CHLORIDE 0.9 % IV BOLUS (SEPSIS)
1000.0000 mL | Freq: Once | INTRAVENOUS | Status: AC
  Administered 2017-04-07: 1000 mL via INTRAVENOUS

## 2017-04-07 MED ORDER — METOCLOPRAMIDE HCL 5 MG/ML IJ SOLN
10.0000 mg | Freq: Once | INTRAMUSCULAR | Status: AC
  Administered 2017-04-07: 10 mg via INTRAVENOUS
  Filled 2017-04-07: qty 2

## 2017-04-07 MED ORDER — ONDANSETRON HCL 4 MG/2ML IJ SOLN
4.0000 mg | Freq: Once | INTRAMUSCULAR | Status: AC
  Administered 2017-04-07: 4 mg via INTRAVENOUS

## 2017-04-07 MED ORDER — ONDANSETRON HCL 4 MG/2ML IJ SOLN
INTRAMUSCULAR | Status: AC
  Filled 2017-04-07: qty 2

## 2017-04-07 MED ORDER — HYDROCODONE-ACETAMINOPHEN 5-325 MG PO TABS
1.0000 | ORAL_TABLET | Freq: Four times a day (QID) | ORAL | Status: DC | PRN
  Administered 2017-04-08 – 2017-04-09 (×2): 1 via ORAL
  Filled 2017-04-07 (×2): qty 1

## 2017-04-07 MED ORDER — ONDANSETRON 4 MG PO TBDP
ORAL_TABLET | ORAL | Status: AC
  Filled 2017-04-07: qty 1

## 2017-04-07 MED ORDER — BISACODYL 10 MG RE SUPP: 10.0000 mg | Freq: Every day | RECTAL | Status: DC | PRN

## 2017-04-07 MED ORDER — PANTOPRAZOLE SODIUM 40 MG IV SOLR
40.0000 mg | Freq: Two times a day (BID) | INTRAVENOUS | Status: DC
  Administered 2017-04-07 – 2017-04-09 (×4): 40 mg via INTRAVENOUS
  Filled 2017-04-07 (×4): qty 40

## 2017-04-07 NOTE — ED Notes (Signed)
Patient was given a specimen cup to collect a sample of urine when she has to go.

## 2017-04-07 NOTE — ED Triage Notes (Signed)
Patient arrives POV from home with c/o N/V/D since 0200 this AM. + ABD pain located inferior to the xiphoid process non radiating.

## 2017-04-07 NOTE — H&P (Signed)
History and Physical    Jasmine Mason GGE:[id number] DOB: 26-Jun-1986 DOA: 04/07/2017  Referring physician: Dr. Clearnce Hasten PCP: Jules Schick, NP  Specialists: none  Chief Complaint: abdominal pain with vomiting  HPI: Jasmine Mason is a 31 y.o. female has a past medical history significant for HTN, GERD, and gastritis now with recurrent diffuse abdominal pain with intractable N/V not relieved in ER despite multiple doses of IV meds. She is now admitted. No fever. WBC elevated. Plain films negative. No diarrhea. Denies CP or SOB.  Review of Systems: The patient denies anorexia, fever, weight loss,, vision loss, decreased hearing, hoarseness, chest pain, syncope, dyspnea on exertion, peripheral edema, balance deficits, hemoptysis,  melena, hematochezia, severe indigestion/heartburn, hematuria, incontinence, genital sores, muscle weakness, suspicious skin lesions, transient blindness, difficulty walking, depression, unusual weight change, abnormal bleeding, enlarged lymph nodes, angioedema, and breast masses.   Past Medical History:  Diagnosis Date  . Back pain   . Back pain   . GERD (gastroesophageal reflux disease)   . Hypertension   . Mesenteric adenitis   . Obesity   . Tobacco abuse    Past Surgical History:  Procedure Laterality Date  . CARPAL TUNNEL RELEASE Left ~ 2013  . ESOPHAGOGASTRODUODENOSCOPY N/A 01/25/2015   Procedure: ESOPHAGOGASTRODUODENOSCOPY (EGD);  Surgeon: Irene Shipper, MD;  Location: [facility] ENDOSCOPY;  Service: Endoscopy;  Laterality: N/A;  . ESOPHAGOGASTRODUODENOSCOPY (EGD) WITH PROPOFOL N/A 03/13/2016   Procedure: ESOPHAGOGASTRODUODENOSCOPY (EGD) WITH PROPOFOL;  Surgeon: Lucilla Lame, MD;  Location: [facility] ENDOSCOPY;  Service: Endoscopy;  Laterality: N/A;  . FRACTURE SURGERY    . ORIF ANKLE FRACTURE Left 1990's   Social History:  reports that she has been smoking Cigarettes.  She has a 2.50 pack-year smoking history. She has never used smokeless tobacco. She reports  that she does not drink alcohol or use drugs.  No Known Allergies  Family History  Problem Relation Age of Onset  . Hypertension Mother   . Hypertension Father   . Diabetes Father   . Heart disease Father   . Hypertension Sister     Prior to Admission medications   Medication Sig Start Date End Date Taking? Authorizing Provider  aluminum-magnesium hydroxide-simethicone (MAALOX) 465-035-46 MG/5ML SUSP Take 30 mLs by mouth 4 (four) times daily -  before meals and at bedtime.   Yes [provider]  clindamycin (CLEOCIN) 300 MG capsule Take 1 capsule by mouth 4 (four) times daily.  03/22/17  Yes [provider]  HYDROcodone-acetaminophen (NORCO/VICODIN) 5-325 MG tablet Take 1 tablet by mouth every 6 (six) hours as needed for moderate pain or severe pain. 09/21/16  Yes Vaughan Basta, MD  metoCLOPramide (REGLAN) 5 MG tablet Take 1 tablet (5 mg total) by mouth every 8 (eight) hours as needed for nausea or vomiting. 10/11/16  Yes Daymon Larsen, MD  norethindrone-ethinyl estradiol-iron (LOESTRIN FE 1.5/30) 1.5-30 MG-MCG tablet Take 1 tablet by mouth at bedtime. 03/12/17 04/09/17 Yes Harlin Heys, MD  ondansetron (ZOFRAN) 4 MG tablet Take 1 tablet (4 mg total) by mouth every 6 (six) hours. 02/16/17  Yes Volanda Napoleon, PA-C  pantoprazole (PROTONIX) 40 MG tablet Take 1 tablet (40 mg total) by mouth daily. Patient taking differently: Take 40 mg by mouth 2 (two) times daily.  02/16/17  Yes Volanda Napoleon, PA-C  promethazine (PHENERGAN) 25 MG suppository Place 1 suppository (25 mg total) rectally every 6 (six) hours as needed for nausea. 11/26/16 11/26/17 Yes Schuyler Amor, MD  famotidine (PEPCID) 20 MG tablet  Take 1 tablet (20 mg total) by mouth 2 (two) times daily. Patient not taking: Reported on 02/21/2017 10/11/16   Daymon Larsen, MD  lidocaine (LIDODERM) 5 % Place 1 patch onto the skin daily. Patient not taking: Reported on 08/28/2016 06/12/16   Daymon Larsen, MD    Physical Exam: Vitals:   04/07/17 1400 04/07/17 1430 04/07/17 1500 04/07/17 1530  BP: (!) 151/92 (!) 184/97 (!) 152/81 136/81  Pulse: 86 (!) 49 71 87  Resp:      Temp:      TempSrc:      SpO2: 100% 100% 100% 100%  Weight:      Height:         General:  No apparent distress, [place]/AT, WDWN  Eyes: PERRL, EOMI, no scleral icterus, conjunctiva clear  ENT: moist oropharynx without exudate, TM's benign, dentition fair  Neck: supple, no lymphadenopathy. No bruits or thyromegaly  Cardiovascular: regular rate without MRG; 2+ peripheral pulses, no JVD, no peripheral edema  Respiratory: CTA biL, good air movement without wheezing, rhonchi or crackled. Respiratory effort normal  Abdomen: soft, diffusely tender to palpation, positive bowel sounds, no guarding, no rebound  Skin: no rashes or lesions  Musculoskeletal: normal bulk and tone, no joint swelling  Psychiatric: normal mood and affect, A&OX3  Neurologic: CN 2-12 grossly intact, Motor strength 5/5 in all 4 groups with symmetric DTR's and non-focal sensory exam  Labs on Admission:  Basic Metabolic Panel:  Recent Labs Lab 04/07/17 1147  NA 139  K 4.0  CL 107  CO2 24  GLUCOSE 144*  BUN 10  CREATININE 0.61  CALCIUM 9.1   Liver Function Tests:  Recent Labs Lab 04/07/17 1147  AST 24  ALT 22  ALKPHOS 77  BILITOT 0.6  PROT 8.2*  ALBUMIN 4.3    Recent Labs Lab 04/07/17 1147  LIPASE 21   No results for input(s): AMMONIA in the last 168 hours. CBC:  Recent Labs Lab 04/07/17 1147  WBC 16.7*  HGB 14.5  HCT 43.2  MCV 87.2  PLT 352   Cardiac Enzymes: No results for input(s): CKTOTAL, CKMB, CKMBINDEX, TROPONINI in the last 168 hours.  BNP (last 3 results) No results for input(s): BNP in the last 8760 hours.  ProBNP (last 3 results) No results for input(s): PROBNP in the last 8760 hours.  CBG: No results for input(s): GLUCAP in the last 168 hours.  Radiological Exams on Admission: Dg Abdomen 1  View  Result Date: 04/07/2017 CLINICAL DATA:  Epigastric pain, nausea, vomiting, diarrhea since 0200 this AM. Hx - GERD, mesenteric adenitis, current smoker 0.25 ppd. EXAM: ABDOMEN - 1 VIEW COMPARISON:  CT 09/20/2016 FINDINGS: Two supine views. Nonobstructive bowel-gas pattern. No gross free intraperitoneal air. No abnormal abdominal calcifications. No appendicolith. Distal gas and stool. IMPRESSION: No acute findings. Electronically Signed   By: Abigail Miyamoto M.D.   On: 04/07/2017 13:11    EKG: Independently reviewed.  Assessment/Plan Principal Problem:   Abdominal pain Active Problems:   GERD (gastroesophageal reflux disease)   Uncontrollable vomiting   Acute gastritis   Will admit to floor with IV fluids, IV Protonix, IV Reglan, and prn IV dilaudid. Clear liquid diet. CT of abdomen ordered. GI consult requested. Repeat labs in AM  Diet: clear liquids Fluids: NS@100  DVT Prophylaxis: SQ Heparin  Code Status: FULL  Family Communication: yes  Disposition Plan: home  Time spent: 50 min

## 2017-04-07 NOTE — ED Provider Notes (Signed)
[facility] Emergency Department Provider Note  ____________________________________________   First MD Initiated Contact with Patient 04/07/17 1215     (approximate)  I have reviewed the triage vital signs and the nursing notes.   HISTORY  Chief Complaint Nausea; Emesis; and Diarrhea   HPI Jasmine Mason is a 31 y.o. female with a history of gastritis with nausea, vomiting and epigastric abdominal pain who is presenting to the emergency department today with nausea vomiting and epigastric abdominal pain. She says that her symptoms started at about 2 AM last night. She describes her pain is to the upper abdomen and sharp, radiating to her low back. She says the pain also radiates to her left lower quadrant which is also common for her when she is having worsening She is not reporting any chest pain or shortness of breath. Denies any diarrhea. Says that her vomit is clear. Says that she has been compliant with her Protonix and also uses Zofran at home   Past Medical History:  Diagnosis Date  . Back pain   . Back pain   . GERD (gastroesophageal reflux disease)   . Hypertension   . Mesenteric adenitis   . Obesity   . Tobacco abuse     Patient Active Problem List   Diagnosis Date Noted  . Acute gastritis 09/20/2016  . Nausea with vomiting   . Hiatal hernia   . Uncontrollable vomiting 03/10/2016  . Abdominal pain, epigastric   . GERD (gastroesophageal reflux disease) 01/24/2015  . Hypokalemia 01/24/2015  . Abdominal pain 01/24/2015  . Leukocytosis 01/24/2015  . Chest pain 01/24/2015  . Cough 01/24/2015  . Pain of left calf 01/24/2015  . Elevated troponin 01/24/2015  . Obesity   . Mesenteric adenitis   . Tobacco abuse     Past Surgical History:  Procedure Laterality Date  . CARPAL TUNNEL RELEASE Left ~ 2013  . ESOPHAGOGASTRODUODENOSCOPY N/A 01/25/2015   Procedure: ESOPHAGOGASTRODUODENOSCOPY (EGD);  Surgeon: Irene Shipper, MD;  Location: [facility]  ENDOSCOPY;  Service: Endoscopy;  Laterality: N/A;  . ESOPHAGOGASTRODUODENOSCOPY (EGD) WITH PROPOFOL N/A 03/13/2016   Procedure: ESOPHAGOGASTRODUODENOSCOPY (EGD) WITH PROPOFOL;  Surgeon: Lucilla Lame, MD;  Location: [facility] ENDOSCOPY;  Service: Endoscopy;  Laterality: N/A;  . FRACTURE SURGERY    . ORIF ANKLE FRACTURE Left 1990's    Prior to Admission medications   Medication Sig Start Date End Date Taking? Authorizing Provider  famotidine (PEPCID) 20 MG tablet Take 1 tablet (20 mg total) by mouth 2 (two) times daily. Patient not taking: Reported on 02/21/2017 10/11/16   Daymon Larsen, MD  HYDROcodone-acetaminophen (NORCO/VICODIN) 5-325 MG tablet Take 1 tablet by mouth every 6 (six) hours as needed for moderate pain or severe pain. Patient not taking: Reported on 09/28/2016 09/21/16   Vaughan Basta, MD  lidocaine (LIDODERM) 5 % Place 1 patch onto the skin daily. Patient not taking: Reported on 08/28/2016 06/12/16   Daymon Larsen, MD  metoCLOPramide (REGLAN) 5 MG tablet Take 1 tablet (5 mg total) by mouth every 8 (eight) hours as needed for nausea or vomiting. Patient not taking: Reported on 02/21/2017 10/11/16   Daymon Larsen, MD  norethindrone-ethinyl estradiol-iron (LOESTRIN FE 1.5/30) 1.5-30 MG-MCG tablet Take 1 tablet by mouth at bedtime. 03/12/17 04/09/17  Harlin Heys, MD  ondansetron (ZOFRAN) 4 MG tablet Take 1 tablet (4 mg total) by mouth every 6 (six) hours. Patient not taking: Reported on 03/12/2017 02/16/17   Providence Lanius A, PA-C  pantoprazole (PROTONIX) 40 MG  tablet Take 1 tablet (40 mg total) by mouth daily. Patient not taking: Reported on 03/12/2017 02/16/17   Volanda Napoleon, PA-C  promethazine (PHENERGAN) 25 MG suppository Place 1 suppository (25 mg total) rectally every 6 (six) hours as needed for nausea. Patient not taking: Reported on 02/21/2017 11/26/16 11/26/17  Schuyler Amor, MD    Allergies Patient has no known allergies.  Family History  Problem Relation Age of  Onset  . Hypertension Mother   . Hypertension Father   . Diabetes Father   . Heart disease Father   . Hypertension Sister     Social History Social History  Substance Use Topics  . Smoking status: Current Every Day Smoker    Packs/day: 0.25    Years: 10.00    Types: Cigarettes  . Smokeless tobacco: Never Used  . Alcohol use No    Review of Systems  Constitutional: No fever/chills Eyes: No visual changes. ENT: No sore throat. Cardiovascular: Denies chest pain. Respiratory: Denies shortness of breath. Gastrointestinal:  No diarrhea.  No constipation. Genitourinary: Negative for dysuria. Musculoskeletal: as above Skin: Negative for rash. Neurological: Negative for headaches, focal weakness or numbness.   ____________________________________________   PHYSICAL EXAM:  VITAL SIGNS: ED Triage Vitals  Enc Vitals Group     BP 04/07/17 1153 (!) 162/77     Pulse --      Resp 04/07/17 1151 16     Temp 04/07/17 1151 99 F (37.2 C)     Temp Source 04/07/17 1151 Oral     SpO2 04/07/17 1151 100 %     Weight 04/07/17 1151 212 lb (96.2 kg)     Height 04/07/17 1151 5\' 7"  (1.702 m)     Head Circumference --      Peak Flow --      Pain Score --      Pain Loc --      Pain Edu? --      Excl. in [place]? --     Constitutional: Alert and oriented. patient appears uncomfortable. Actively vomiting in the room. Eyes: Conjunctivae are normal.  Head: Atraumatic. Nose: No congestion/rhinnorhea. Mouth/Throat: Mucous membranes are moist.  Neck: No stridor.   Cardiovascular: Normal rate, regular rhythm. Grossly normal heart sounds.  Respiratory: Normal respiratory effort.  No retractions. Lungs CTAB. Gastrointestinal: Soft with mild to moderate epigastric tenderness as well as left lower quadrant mild tenderness to palpation. There is no rebound or guarding. Negative Murphy sign. No tenderness over the McBurney's point. No distention.  Musculoskeletal: No lower extremity tenderness nor  edema.  No joint effusions. Neurologic:  Normal speech and language. No gross focal neurologic deficits are appreciated. Skin:  Skin is warm, dry and intact. No rash noted. Psychiatric: Mood and affect are normal. Speech and behavior are normal.  ____________________________________________   LABS (all labs ordered are listed, but only abnormal results are displayed)  Labs Reviewed  COMPREHENSIVE METABOLIC PANEL - Abnormal; Notable for the following:       Result Value   Glucose, Bld 144 (*)    Total Protein 8.2 (*)    All other components within normal limits  CBC - Abnormal; Notable for the following:    WBC 16.7 (*)    All other components within normal limits  LIPASE, BLOOD  HCG, QUANTITATIVE, PREGNANCY  URINALYSIS, COMPLETE (UACMP) WITH MICROSCOPIC  POC URINE PREG, ED   ____________________________________________  EKG   ____________________________________________  RADIOLOGY  no acute findings ____________________________________________   PROCEDURES  Procedure(s) performed:  Procedures  Critical Care performed:   ____________________________________________   INITIAL IMPRESSION / ASSESSMENT AND PLAN / ED COURSE  Pertinent labs & imaging results that were available during my care of the patient were reviewed by me and considered in my medical decision making (see chart for details).  patient with elevated white blood cell count which appears to be chronic.  multiple CT scans in the record without acute pathology to explain the patient's abdominal pain. Presentation appears to be consistent with chronic issue. I will medicate the patient and then reevaluate.    ----------------------------------------- 3:59 PM on 04/07/2017 -----------------------------------------  Despite multiple doses of Zofran as well as morphine, fluids and Protonix patient is persistently vomiting. She will be admitted to the hospital. Signed out to Dr. Anselm Jungling. Patient is  understanding the plan and willing to comply.likely worsening of the patient's chronic issues. We will hold on CAT scan for the time being.  ____________________________________________   FINAL CLINICAL IMPRESSION(S) / ED DIAGNOSES  abdominal pain with nausea vomiting and diarrhea.    NEW MEDICATIONS STARTED DURING THIS VISIT:  New Prescriptions   No medications on file     Note:  This document was prepared using Dragon voice recognition software and may include unintentional dictation errors.     Orbie Pyo, MD 04/07/17 1600

## 2017-04-08 DIAGNOSIS — R112 Nausea with vomiting, unspecified: Secondary | ICD-10-CM | POA: Diagnosis not present

## 2017-04-08 DIAGNOSIS — R1013 Epigastric pain: Secondary | ICD-10-CM

## 2017-04-08 LAB — COMPREHENSIVE METABOLIC PANEL
ALK PHOS: 56 U/L (ref 38–126)
ALT: 15 U/L (ref 14–54)
AST: 18 U/L (ref 15–41)
Albumin: 3.5 g/dL (ref 3.5–5.0)
Anion gap: 7 (ref 5–15)
BUN: 8 mg/dL (ref 6–20)
CALCIUM: 8.2 mg/dL — AB (ref 8.9–10.3)
CO2: 23 mmol/L (ref 22–32)
CREATININE: 0.64 mg/dL (ref 0.44–1.00)
Chloride: 109 mmol/L (ref 101–111)
Glucose, Bld: 111 mg/dL — ABNORMAL HIGH (ref 65–99)
Potassium: 2.8 mmol/L — ABNORMAL LOW (ref 3.5–5.1)
SODIUM: 139 mmol/L (ref 135–145)
Total Bilirubin: 0.8 mg/dL (ref 0.3–1.2)
Total Protein: 6.6 g/dL (ref 6.5–8.1)

## 2017-04-08 LAB — GLUCOSE, CAPILLARY: GLUCOSE-CAPILLARY: 90 mg/dL (ref 65–99)

## 2017-04-08 LAB — CBC
HCT: 37.2 % (ref 35.0–47.0)
Hemoglobin: 12.9 g/dL (ref 12.0–16.0)
MCH: 30.5 pg (ref 26.0–34.0)
MCHC: 34.6 g/dL (ref 32.0–36.0)
MCV: 88.1 fL (ref 80.0–100.0)
PLATELETS: 288 10*3/uL (ref 150–440)
RBC: 4.23 MIL/uL (ref 3.80–5.20)
RDW: 13.4 % (ref 11.5–14.5)
WBC: 15 10*3/uL — ABNORMAL HIGH (ref 3.6–11.0)

## 2017-04-08 MED ORDER — POTASSIUM CHLORIDE 10 MEQ/100ML IV SOLN
10.0000 meq | INTRAVENOUS | Status: AC
  Administered 2017-04-08 (×4): 10 meq via INTRAVENOUS
  Filled 2017-04-08 (×4): qty 100

## 2017-04-08 NOTE — Progress Notes (Addendum)
[facility] NAME: Jasmine Mason    MR#:  [id number]  DATE OF BIRTH:  [birth year]  SUBJECTIVE: Patient is admitted for abdominal pain, acute gastritis. Today she still has abdominal pain but better than yesterday. No vomiting but has some nausea. She is willing to try a soft diet.   CHIEF COMPLAINT:   Chief Complaint  Patient presents with  . Nausea  . Emesis  . Diarrhea    REVIEW OF SYSTEMS:   ROS CONSTITUTIONAL: No fever, fatigue or weakness.  EYES: No blurred or double vision.  EARS, NOSE, AND THROAT: No tinnitus or ear pain.  RESPIRATORY: No cough, shortness of breath, wheezing or hemoptysis.  CARDIOVASCULAR: No chest pain, orthopnea, edema.  GASTROINTESTINAL: Nausea, abdominal pain GENITOURINARY: No dysuria, hematuria.  ENDOCRINE: No polyuria, nocturia,  HEMATOLOGY: No anemia, easy bruising or bleeding SKIN: No rash or lesion. MUSCULOSKELETAL: No joint pain or arthritis.   NEUROLOGIC: No tingling, numbness, weakness.  PSYCHIATRY: No anxiety or depression.   DRUG ALLERGIES:  No Known Allergies  VITALS:  Blood pressure 136/74, pulse 79, temperature 98.7 F (37.1 C), temperature source Oral, resp. rate 20, height 5\' 7"  (1.702 m), weight 100.2 kg (221 lb), last menstrual period 04/05/2017, SpO2 99 %.  PHYSICAL EXAMINATION:  GENERAL:  31 y.o.-year-old patient lying in the bed with no acute distress.  EYES: Pupils equal, round, reactive to light and accommodation. No scleral icterus. Extraocular muscles intact.  HEENT: Head atraumatic, normocephalic. Oropharynx and nasopharynx clear.  NECK:  Supple, no jugular venous distention. No thyroid enlargement, no tenderness.  LUNGS: Normal breath sounds bilaterally, no wheezing, rales,rhonchi or crepitation. No use of accessory muscles of respiration.  CARDIOVASCULAR: S1, S2 normal. No murmurs, rubs, or gallops.  ABDOMEN;midEpigastric tenderness present, no rebound  tenderness: bowel sounds present EXTREMITIES: No pedal edema, cyanosis, or clubbing.  NEUROLOGIC: Cranial nerves II through XII are intact. Muscle strength 5/5 in all extremities. Sensation intact. Gait not checked.  PSYCHIATRIC: The patient is alert and oriented x 3.  SKIN: No obvious rash, lesion, or ulcer.    LABORATORY PANEL:   CBC  Recent Labs Lab 04/08/17 0351  WBC 15.0*  HGB 12.9  HCT 37.2  PLT 288   ------------------------------------------------------------------------------------------------------------------  Chemistries   Recent Labs Lab 04/08/17 0351  NA 139  K 2.8*  CL 109  CO2 23  GLUCOSE 111*  BUN 8  CREATININE 0.64  CALCIUM 8.2*  AST 18  ALT 15  ALKPHOS 56  BILITOT 0.8   ------------------------------------------------------------------------------------------------------------------  Cardiac Enzymes No results for input(s): TROPONINI in the last 168 hours. ------------------------------------------------------------------------------------------------------------------  RADIOLOGY:  Dg Abdomen 1 View  Result Date: 04/07/2017 CLINICAL DATA:  Epigastric pain, nausea, vomiting, diarrhea since 0200 this AM. Hx - GERD, mesenteric adenitis, current smoker 0.25 ppd. EXAM: ABDOMEN - 1 VIEW COMPARISON:  CT 09/20/2016 FINDINGS: Two supine views. Nonobstructive bowel-gas pattern. No gross free intraperitoneal air. No abnormal abdominal calcifications. No appendicolith. Distal gas and stool. IMPRESSION: No acute findings. Electronically Signed   By: Abigail Miyamoto M.D.   On: 04/07/2017 13:11   Ct Abdomen Pelvis W Contrast  Result Date: 04/07/2017 CLINICAL DATA:  Demonstrate miss nausea vomiting and epigastric pain EXAM: CT ABDOMEN AND PELVIS WITH CONTRAST TECHNIQUE: Multidetector CT imaging of the abdomen and pelvis was performed using the standard protocol following bolus administration of intravenous contrast. CONTRAST:  116mL ISOVUE-300 IOPAMIDOL  (ISOVUE-300) INJECTION 61% COMPARISON:  04/07/2017, 09/20/2016 FINDINGS: Lower chest: No acute abnormality.  Hepatobiliary: No calcified stones. No focal hepatic abnormality or biliary dilatation. Pancreas: Unremarkable. No pancreatic ductal dilatation or surrounding inflammatory changes. Spleen: Normal in size without focal abnormality. Adrenals/Urinary Tract: Adrenal glands are unremarkable. Kidneys are normal, without renal calculi, focal lesion, or hydronephrosis. Bladder is unremarkable. Stomach/Bowel: Stomach is within normal limits. Appendix appears normal. No evidence of bowel wall thickening, distention, or inflammatory changes. Vascular/Lymphatic: No significant vascular findings are present. No enlarged abdominal or pelvic lymph nodes. Reproductive: Uterus unremarkable. Enlarged ovaries. No adnexal mass Other: Negative for free air or free fluid. Moderate fat in the umbilicus Musculoskeletal: No acute or significant osseous findings. IMPRESSION: 1. No CT evidence for acute intra-abdominal or pelvic pathology. 2. Enlarged ovaries as before Electronically Signed   By: Donavan Foil M.D.   On: 04/07/2017 22:08    EKG:   Orders placed or performed during the hospital encounter of 09/20/16  . EKG 12-Lead  . EKG 12-Lead  . EKG    ASSESSMENT AND PLAN:   1. midepigastric abdominal pain, nausea and vomiting likely due to acute gastritis. Patient is on IV PPIs, IV fluids. Clinically improving, decreasing his IV fluids, change to soft diet today. Likely discharge tomorrow home. Multiple visits to ER, admitted in March 2018. Patient had EGD last year in August and it showed only small hiatal hernia but no acute gastritis and EGD unremarkable. Suspect narcotic seeking behavior also.  #2. severe hypokalemia secondary to nausea, vomiting: Replace the potassium, check magnesium also.  #3. Leukocytosis likely due to stress. On Zosyn right now. Stop antibiotics. All the records are reviewed and case  discussed with Care Management/Social Workerr. Management plans discussed with the patient, family and they are in agreement.  CODE STATUS:   TOTAL TIME TAKING CARE OF THIS PATIENT: 35 min minutes.   POSSIBLE D/C IN 1-2DAYS, DEPENDING ON CLINICAL CONDITION.   Epifanio Lesches M.D on 04/08/2017 at 9:37 AM  Between 7am to 6pm - Pager - [phone]  After 6pm go to [email or web address] - password [email or web address] [facility]  [facility]  Office  [phone]  CC: Primary care physician; Jules Schick, NP   Note: This dictation was prepared with Dragon dictation along with smaller phrase technology. Any transcriptional errors that result from this process are unintentional.

## 2017-04-08 NOTE — Consult Note (Addendum)
Jasmine Lame, MD [address] [place], [place] [address] Phone: [phone] Fax : [phone]  Consultation  Referring Provider:     Dr. Doy Hutching Primary Care Physician:  Jules Schick, NP Primary Gastroenterologist:  Dr. Allen Norris         Reason for Consultation:     Nausea and  vomiting  Date of Admission:  04/07/2017 Date of Consultation:  04/08/2017         HPI:   Jasmine Mason is a 31 y.o. female who has a history of attacks of nausea and vomiting with abdominal pain. The patient had an upper endoscopy back in July 2016 that was found to be normal by Dr. Henrene Pastor for the same symptoms. The patient then saw me August last year with similar symptoms that resulted in another upper endoscopy that did not show any source of the patient's symptoms. The patient states that she does better when she is on metoclopramide and Protonix. She states that she takes the Protonix twice a day at home. She now reports that she is not nauseous today but still has abdominal pain. The patient's abdominal pain is diffusely throughout the abdomen. There is no report of any black stools or bloody stools.The patient is not sure what has been bringing on these attacks around the same time every year.  Past Medical History:  Diagnosis Date  . Back pain   . Back pain   . GERD (gastroesophageal reflux disease)   . Hypertension   . Mesenteric adenitis   . Obesity   . Tobacco abuse     Past Surgical History:  Procedure Laterality Date  . CARPAL TUNNEL RELEASE Left ~ 2013  . ESOPHAGOGASTRODUODENOSCOPY N/A 01/25/2015   Procedure: ESOPHAGOGASTRODUODENOSCOPY (EGD);  Surgeon: Irene Shipper, MD;  Location[facility] ENDOSCOPY;  Service: Endoscopy;  Laterality: N/A;  . ESOPHAGOGASTRODUODENOSCOPY (EGD) WITH PROPOFOL N/A 03/13/2016   Procedure: ESOPHAGOGASTRODUODENOSCOPY (EGD) WITH PROPOFOL;  Surgeon: Jasmine Lame, MD;  Location: [facility] ENDOSCOPY;  Service: Endoscopy;  Laterality: N/A;  . FRACTURE SURGERY    .  ORIF ANKLE FRACTURE Left 1990's    Prior to Admission medications   Medication Sig Start Date End Date Taking? Authorizing Provider  aluminum-magnesium hydroxide-simethicone (MAALOX) [phone] MG/5ML SUSP Take 30 mLs by mouth 4 (four) times daily -  before meals and at bedtime.   Yes [provider]  clindamycin (CLEOCIN) 300 MG capsule Take 1 capsule by mouth 4 (four) times daily.  03/22/17  Yes [provider]  HYDROcodone-acetaminophen (NORCO/VICODIN) 5-325 MG tablet Take 1 tablet by mouth every 6 (six) hours as needed for moderate pain or severe pain. 09/21/16  Yes Vaughan Basta, MD  metoCLOPramide (REGLAN) 5 MG tablet Take 1 tablet (5 mg total) by mouth every 8 (eight) hours as needed for nausea or vomiting. 10/11/16  Yes Daymon Larsen, MD  norethindrone-ethinyl estradiol-iron (LOESTRIN FE 1.5/30) 1.5-30 MG-MCG tablet Take 1 tablet by mouth at bedtime. 03/12/17 04/09/17 Yes Harlin Heys, MD  ondansetron (ZOFRAN) 4 MG tablet Take 1 tablet (4 mg total) by mouth every 6 (six) hours. 02/16/17  Yes Volanda Napoleon, PA-C  pantoprazole (PROTONIX) 40 MG tablet Take 1 tablet (40 mg total) by mouth daily. Patient taking differently: Take 40 mg by mouth 2 (two) times daily.  02/16/17  Yes Volanda Napoleon, PA-C  promethazine (PHENERGAN) 25 MG suppository Place 1 suppository (25 mg total) rectally every 6 (six) hours as needed for nausea. 11/26/16 11/26/17 Yes Charlotte Crumb  A, MD  famotidine (PEPCID) 20 MG tablet Take 1 tablet (20 mg total) by mouth 2 (two) times daily. Patient not taking: Reported on 02/21/2017 10/11/16   Daymon Larsen, MD  lidocaine (LIDODERM) 5 % Place 1 patch onto the skin daily. Patient not taking: Reported on 08/28/2016 06/12/16   Daymon Larsen, MD    Family History  Problem Relation Age of Onset  . Hypertension Mother   . Hypertension Father   . Diabetes Father   . Heart disease Father   . Hypertension Sister      Social History  Substance  Use Topics  . Smoking status: Current Every Day Smoker    Packs/day: 0.25    Years: 10.00    Types: Cigarettes  . Smokeless tobacco: Never Used  . Alcohol use No    Allergies as of 04/07/2017  . (No Known Allergies)    Review of Systems:    All systems reviewed and negative except where noted in HPI.   Physical Exam:  Vital signs in last 24 hours: Temp:  [97.7 F (36.5 C)-98.7 F (37.1 C)] 98.4 F (36.9 C) (09/17 1336) Pulse Rate:  [72-86] 86 (09/17 1336) Resp:  [18-22] 20 (09/17 0435) BP: (109-136)/(58-74) 134/70 (09/17 1336) SpO2:  [99 %-100 %] 100 % (09/17 1336) Weight:  [221 lb (100.2 kg)] 221 lb (100.2 kg) (09/17 0503) Last BM Date: 04/07/17 General:   Pleasant, cooperative in NAD Head:  Normocephalic and atraumatic. Eyes:   No icterus.   Conjunctiva pink. PERRLA. Ears:  Normal auditory acuity. Neck:  Supple; no masses or thyroidomegaly Lungs: Respirations even and unlabored. Lungs clear to auscultation bilaterally.   No wheezes, crackles, or rhonchi.  Heart:  Regular rate and rhythm;  Without murmur, clicks, rubs or gallops Abdomen:  Soft, nondistended, positive tenderness to 1 finger palpation while flexing the abdominal wall muscles. Normal bowel sounds. No appreciable masses or hepatomegaly.  No rebound or guarding.  Rectal:  Not performed. Msk:  Symmetrical without gross deformities.    Extremities:  Without edema, cyanosis or clubbing. Neurologic:  Alert and oriented x3;  grossly normal neurologically. Skin:  Intact without significant lesions or rashes. Cervical Nodes:  No significant cervical adenopathy. Psych:  Alert and cooperative. Normal affect.  LAB RESULTS:  Recent Labs  04/07/17 1147 04/08/17 0351  WBC 16.7* 15.0*  HGB 14.5 12.9  HCT 43.2 37.2  PLT 352 288   BMET  Recent Labs  04/07/17 1147 04/08/17 0351  NA 139 139  K 4.0 2.8*  CL 107 109  CO2 24 23  GLUCOSE 144* 111*  BUN 10 8  CREATININE 0.61 0.64  CALCIUM 9.1 8.2*    LFT  Recent Labs  04/08/17 0351  PROT 6.6  ALBUMIN 3.5  AST 18  ALT 15  ALKPHOS 56  BILITOT 0.8   PT/INR No results for input(s): LABPROT, INR in the last 72 hours.  STUDIES: Dg Abdomen 1 View  Result Date: 04/07/2017 CLINICAL DATA:  Epigastric pain, nausea, vomiting, diarrhea since 0200 this AM. Hx - GERD, mesenteric adenitis, current smoker 0.25 ppd. EXAM: ABDOMEN - 1 VIEW COMPARISON:  CT 09/20/2016 FINDINGS: Two supine views. Nonobstructive bowel-gas pattern. No gross free intraperitoneal air. No abnormal abdominal calcifications. No appendicolith. Distal gas and stool. IMPRESSION: No acute findings. Electronically Signed   By: Abigail Miyamoto M.D.   On: 04/07/2017 13:11   Ct Abdomen Pelvis W Contrast  Result Date: 04/07/2017 CLINICAL DATA:  Demonstrate miss nausea vomiting and epigastric pain  EXAM: CT ABDOMEN AND PELVIS WITH CONTRAST TECHNIQUE: Multidetector CT imaging of the abdomen and pelvis was performed using the standard protocol following bolus administration of intravenous contrast. CONTRAST:  164mL ISOVUE-300 IOPAMIDOL (ISOVUE-300) INJECTION 61% COMPARISON:  04/07/2017, 09/20/2016 FINDINGS: Lower chest: No acute abnormality. Hepatobiliary: No calcified stones. No focal hepatic abnormality or biliary dilatation. Pancreas: Unremarkable. No pancreatic ductal dilatation or surrounding inflammatory changes. Spleen: Normal in size without focal abnormality. Adrenals/Urinary Tract: Adrenal glands are unremarkable. Kidneys are normal, without renal calculi, focal lesion, or hydronephrosis. Bladder is unremarkable. Stomach/Bowel: Stomach is within normal limits. Appendix appears normal. No evidence of bowel wall thickening, distention, or inflammatory changes. Vascular/Lymphatic: No significant vascular findings are present. No enlarged abdominal or pelvic lymph nodes. Reproductive: Uterus unremarkable. Enlarged ovaries. No adnexal mass Other: Negative for free air or free fluid. Moderate  fat in the umbilicus Musculoskeletal: No acute or significant osseous findings. IMPRESSION: 1. No CT evidence for acute intra-abdominal or pelvic pathology. 2. Enlarged ovaries as before Electronically Signed   By: Donavan Foil M.D.   On: 04/07/2017 22:08      Impression / Plan:   Jasmine Mason is a 31 y.o. y/o female with abdominal pain due to vomiting with the pain being musculoskeletal in nature now. The patient denies any further nausea. The patient has had multiple episodes of these symptoms in the past and has been well controlled with Protonix twice a day and Reglan. The patient's 2 previous EGDs have been normal and I do not see a reason to have her do another EGD. The patient's CT scan showed no abnormalities to explain her symptoms. The patient has been told to advance her diet and may be discharged from a GI point of view if she continues to tolerate by mouth's. The patient has been explained the plan and agrees with it.  Thank you for involving me in the care of this patient.      LOS: 0 days   Jasmine Lame, MD  04/08/2017, 4:54 PM   Note: This dictation was prepared with Dragon dictation along with smaller phrase technology. Any transcriptional errors that result from this process are unintentional.

## 2017-04-09 LAB — BASIC METABOLIC PANEL
ANION GAP: 6 (ref 5–15)
BUN: 7 mg/dL (ref 6–20)
CALCIUM: 8.4 mg/dL — AB (ref 8.9–10.3)
CO2: 25 mmol/L (ref 22–32)
Chloride: 109 mmol/L (ref 101–111)
Creatinine, Ser: 0.64 mg/dL (ref 0.44–1.00)
Glucose, Bld: 94 mg/dL (ref 65–99)
Potassium: 3.5 mmol/L (ref 3.5–5.1)
Sodium: 140 mmol/L (ref 135–145)

## 2017-04-09 LAB — GLUCOSE, CAPILLARY: Glucose-Capillary: 91 mg/dL (ref 65–99)

## 2017-04-09 MED ORDER — PANTOPRAZOLE SODIUM 40 MG PO TBEC: 40.0000 mg | DELAYED_RELEASE_TABLET | Freq: Two times a day (BID) | ORAL | 0 refills | Status: DC

## 2017-04-09 NOTE — Discharge Summary (Signed)
Jasmine Mason, is a 31 y.o. female  DOB Mar 24, 1986  MRN [id number].  Admission date:  04/07/2017  Admitting Physician  Idelle Crouch, MD  Discharge Date:  04/09/2017   Primary MD  Jules Schick, NP  Recommendations for primary care physician for things to follow:   Follow-up with PCP in one week   Admission Diagnosis  Epigastric abdominal pain [R10.13] Non-intractable vomiting with nausea, unspecified vomiting type [R11.2]   Discharge Diagnosis  Epigastric abdominal pain [R10.13] Non-intractable vomiting with nausea, unspecified vomiting type [R11.2]    Principal Problem:   Abdominal pain Active Problems:   GERD (gastroesophageal reflux disease)   Uncontrollable vomiting   Acute gastritis   Epigastric abdominal pain      Past Medical History:  Diagnosis Date  . Back pain   . Back pain   . GERD (gastroesophageal reflux disease)   . Hypertension   . Mesenteric adenitis   . Obesity   . Tobacco abuse     Past Surgical History:  Procedure Laterality Date  . CARPAL TUNNEL RELEASE Left ~ 2013  . ESOPHAGOGASTRODUODENOSCOPY N/A 01/25/2015   Procedure: ESOPHAGOGASTRODUODENOSCOPY (EGD);  Surgeon: Irene Shipper, MD;  Location: [facility] ENDOSCOPY;  Service: Endoscopy;  Laterality: N/A;  . ESOPHAGOGASTRODUODENOSCOPY (EGD) WITH PROPOFOL N/A 03/13/2016   Procedure: ESOPHAGOGASTRODUODENOSCOPY (EGD) WITH PROPOFOL;  Surgeon: Lucilla Lame, MD;  Location: [facility] ENDOSCOPY;  Service: Endoscopy;  Laterality: N/A;  . FRACTURE SURGERY    . ORIF ANKLE FRACTURE Left 1990's       History of present illness and  Hospital Course:     Kindly see H&P for history of present illness and admission details, please review complete Labs, Consult reports and Test reports for all details in brief  HPI  from the history and physical done on the  day of admission  31 year old female patient admitted for intractable nausea, vomiting, abdominal pain. Admitted to hospitalist service for possible acute gastritis. #1 generalized abdominal pain but more in the epigastric area associated with intractable nausea, vomiting  Hospital Course  #1 generalized abdominal pain but more in the epigastric area associated with intractable nausea, vomiting; likely due to acute gastritis. Started on IV hydration, IV PPIs, IV nausea medicines. Patient abdominal pain improved, nausea vomiting also improved started on the diet. Seen by gastroenterology also. Recommended PPI twice a day, discharged home with Protonix 40 mg twice a day, patient can continue Phenergan as needed. What she is tolerating regular diet. Patient had EGD before and it was unremarkable so GI signed off. #2 possible abdominal infection with elevated white count on admission started on clindamycin. Patient WBC list from 16.7-15. #3 severe hypokalemia secondary to GI losses with nausea, vomiting: Replace the potassium., Repeat potassium improved from 2.8.5.       Discharge Condition: stable.   Follow UP  Follow-up Information    PCP In 1 week.   Why:  KEPP APPT WITH Cassell Smiles            Discharge Instructions  and  Discharge Medications      Allergies as of 04/09/2017   No Known Allergies     Medication List    STOP taking these medications   famotidine 20 MG tablet Commonly known as:  PEPCID     TAKE these medications   aluminum-magnesium hydroxide-simethicone 086-578-46 MG/5ML Susp Commonly known as:  MAALOX Take 30 mLs by mouth 4 (four) times daily -  before meals and at bedtime.   clindamycin 300 MG  capsule Commonly known as:  CLEOCIN Take 1 capsule by mouth 4 (four) times daily.   HYDROcodone-acetaminophen 5-325 MG tablet Commonly known as:  NORCO/VICODIN Take 1 tablet by mouth every 6 (six) hours as needed for moderate pain or severe pain.    lidocaine 5 % Commonly known as:  LIDODERM Place 1 patch onto the skin daily.   metoCLOPramide 5 MG tablet Commonly known as:  REGLAN Take 1 tablet (5 mg total) by mouth every 8 (eight) hours as needed for nausea or vomiting.   norethindrone-ethinyl estradiol-iron 1.5-30 MG-MCG tablet Commonly known as:  LOESTRIN FE 1.5/30 Take 1 tablet by mouth at bedtime.   ondansetron 4 MG tablet Commonly known as:  ZOFRAN Take 1 tablet (4 mg total) by mouth every 6 (six) hours.   pantoprazole 40 MG tablet Commonly known as:  PROTONIX Take 1 tablet (40 mg total) by mouth daily. What changed:  when to take this   pantoprazole 40 MG tablet Commonly known as:  PROTONIX Take 1 tablet (40 mg total) by mouth 2 (two) times daily. What changed:  You were already taking a medication with the same name, and this prescription was added. Make sure you understand how and when to take each.   promethazine 25 MG suppository Commonly known as:  PHENERGAN Place 1 suppository (25 mg total) rectally every 6 (six) hours as needed for nausea.            Discharge Care Instructions        Start     Ordered   04/09/17 0000  pantoprazole (PROTONIX) 40 MG tablet  2 times daily     04/09/17 1140        Diet and Activity recommendation: See Discharge Instructions above   Consults obtained -Gastroenterology.   Major procedures and Radiology Reports - PLEASE review detailed and final reports for all details, in brief -      Dg Abdomen 1 View  Result Date: 04/07/2017 CLINICAL DATA:  Epigastric pain, nausea, vomiting, diarrhea since 0200 this AM. Hx - GERD, mesenteric adenitis, current smoker 0.25 ppd. EXAM: ABDOMEN - 1 VIEW COMPARISON:  CT 09/20/2016 FINDINGS: Two supine views. Nonobstructive bowel-gas pattern. No gross free intraperitoneal air. No abnormal abdominal calcifications. No appendicolith. Distal gas and stool. IMPRESSION: No acute findings. Electronically Signed   By: Abigail Miyamoto M.D.    On: 04/07/2017 13:11   Ct Abdomen Pelvis W Contrast  Result Date: 04/07/2017 CLINICAL DATA:  Demonstrate miss nausea vomiting and epigastric pain EXAM: CT ABDOMEN AND PELVIS WITH CONTRAST TECHNIQUE: Multidetector CT imaging of the abdomen and pelvis was performed using the standard protocol following bolus administration of intravenous contrast. CONTRAST:  154mL ISOVUE-300 IOPAMIDOL (ISOVUE-300) INJECTION 61% COMPARISON:  04/07/2017, 09/20/2016 FINDINGS: Lower chest: No acute abnormality. Hepatobiliary: No calcified stones. No focal hepatic abnormality or biliary dilatation. Pancreas: Unremarkable. No pancreatic ductal dilatation or surrounding inflammatory changes. Spleen: Normal in size without focal abnormality. Adrenals/Urinary Tract: Adrenal glands are unremarkable. Kidneys are normal, without renal calculi, focal lesion, or hydronephrosis. Bladder is unremarkable. Stomach/Bowel: Stomach is within normal limits. Appendix appears normal. No evidence of bowel wall thickening, distention, or inflammatory changes. Vascular/Lymphatic: No significant vascular findings are present. No enlarged abdominal or pelvic lymph nodes. Reproductive: Uterus unremarkable. Enlarged ovaries. No adnexal mass Other: Negative for free air or free fluid. Moderate fat in the umbilicus Musculoskeletal: No acute or significant osseous findings. IMPRESSION: 1. No CT evidence for acute intra-abdominal or pelvic pathology. 2. Enlarged ovaries as before  Electronically Signed   By: Donavan Foil M.D.   On: 04/07/2017 22:08    Micro Results     No results found for this or any previous visit (from the past 240 hour(s)).     Today   Subjective:   Jasmine Mason today has no headache,no chest abdominal pain,no new weakness tingling or numbness, feels much better wants to go home today.   Objective:   Blood pressure (!) 127/97, pulse 76, temperature 97.7 F (36.5 C), temperature source Oral, resp. rate 17, height 5\' 7"   (1.702 m), weight 100.2 kg (221 lb), last menstrual period 04/05/2017, SpO2 100 %.   Intake/Output Summary (Last 24 hours) at 04/09/17 1334 Last data filed at 04/09/17 1017  Gross per 24 hour  Intake          2659.33 ml  Output             1775 ml  Net           884.33 ml    Exam Awake Alert, Oriented x 3, No new F.N deficits, Normal affect [place].AT,PERRAL Supple Neck,No JVD, No cervical lymphadenopathy appriciated.  Symmetrical Chest wall movement, Good air movement bilaterally, CTAB RRR,No Gallops,Rubs or new Murmurs, No Parasternal Heave +ve B.Sounds, Abd Soft, Non tender, No organomegaly appriciated, No rebound -guarding or rigidity. No Cyanosis, Clubbing or edema, No new Rash or bruise  Data Review   CBC w Diff: Lab Results  Component Value Date   WBC 15.0 (H) 04/08/2017   HGB 12.9 04/08/2017   HCT 37.2 04/08/2017   PLT 288 04/08/2017   LYMPHOPCT 29 02/16/2017   MONOPCT 7 02/16/2017   EOSPCT 2 02/16/2017   BASOPCT 0 02/16/2017    CMP: Lab Results  Component Value Date   NA 140 04/09/2017   K 3.5 04/09/2017   CL 109 04/09/2017   CO2 25 04/09/2017   BUN 7 04/09/2017   CREATININE 0.64 04/09/2017   PROT 6.6 04/08/2017   ALBUMIN 3.5 04/08/2017   BILITOT 0.8 04/08/2017   ALKPHOS 56 04/08/2017   AST 18 04/08/2017   ALT 15 04/08/2017  .   Total Time in preparing paper work, data evaluation and todays exam - 61 minutes  Raegyn Renda M.D on 04/09/2017 at 1:34 PM    Note: This dictation was prepared with Dragon dictation along with smaller phrase technology. Any transcriptional errors that result from this process are unintentional.

## 2017-04-09 NOTE — Progress Notes (Signed)
MD ordered patient to be discharged home.  Discharge instructions were reviewed with the patient and she voiced understanding.  Follow-up appointment was made.  Prescription sent to the patients pharmacy.  IV was removed with catheter intact by student.  All patients questions were answered.  Patient left via wheelchair escorted by auxillary.

## 2017-04-09 NOTE — Progress Notes (Signed)
Called Dr. Vianne Bulls to try and get a prescription for Norco since the patient did not have any more at home.  She acknowledged and said she was not going to write her a prescription for that that she should follow-up with her PCP after she leaves here.

## 2017-04-12 ENCOUNTER — Ambulatory Visit (INDEPENDENT_AMBULATORY_CARE_PROVIDER_SITE_OTHER): Payer: BLUE CROSS/BLUE SHIELD | Admitting: Nurse Practitioner

## 2017-04-12 ENCOUNTER — Encounter: Payer: Self-pay | Admitting: Nurse Practitioner

## 2017-04-12 VITALS — BP 148/95 | Temp 98.6°F | Ht 67.0 in | Wt 226.4 lb

## 2017-04-12 DIAGNOSIS — R1013 Epigastric pain: Secondary | ICD-10-CM

## 2017-04-12 DIAGNOSIS — Z7689 Persons encountering health services in other specified circumstances: Secondary | ICD-10-CM

## 2017-04-12 DIAGNOSIS — R946 Abnormal results of thyroid function studies: Secondary | ICD-10-CM

## 2017-04-12 DIAGNOSIS — K2900 Acute gastritis without bleeding: Secondary | ICD-10-CM | POA: Diagnosis not present

## 2017-04-12 DIAGNOSIS — Z131 Encounter for screening for diabetes mellitus: Secondary | ICD-10-CM | POA: Diagnosis not present

## 2017-04-12 DIAGNOSIS — Z716 Tobacco abuse counseling: Secondary | ICD-10-CM | POA: Diagnosis not present

## 2017-04-12 DIAGNOSIS — Z720 Tobacco use: Secondary | ICD-10-CM | POA: Diagnosis not present

## 2017-04-12 DIAGNOSIS — R7989 Other specified abnormal findings of blood chemistry: Secondary | ICD-10-CM

## 2017-04-12 DIAGNOSIS — I10 Essential (primary) hypertension: Secondary | ICD-10-CM

## 2017-04-12 DIAGNOSIS — Z23 Encounter for immunization: Secondary | ICD-10-CM | POA: Diagnosis not present

## 2017-04-12 MED ORDER — NICOTINE 7 MG/24HR TD PT24: 7.0000 mg | MEDICATED_PATCH | Freq: Every day | TRANSDERMAL | 0 refills | Status: DC

## 2017-04-12 MED ORDER — HYDROCHLOROTHIAZIDE 12.5 MG PO TABS: 12.5000 mg | ORAL_TABLET | Freq: Every day | ORAL | 0 refills | Status: DC

## 2017-04-12 NOTE — Progress Notes (Signed)
Subjective:    Patient ID: Jasmine Mason, female    DOB: 12-Dec-1985, 31 y.o.   MRN: [id number]  Jasmine Mason is a 31 y.o. female presenting on 04/12/2017 for Hospitalization Follow-up (gastritis)   HPI Establish Care New Provider Pt last seen by PCP Dr. Mercie Eon about 1 year ago.  Obtain records from [facility].   [facility]  Admission Date: 04/07/17 Discharge Date: 04/09/17 Admission Diagnosis: Epigastric abdominal pain [R10.13]; Non-intractable vomiting w/ nausea, unspecified type [R11.2] - Other problems during admission: GERD, Acute gastritis, hypokalemia  New Medications: phenergan suppository 25 mg q6h prn  Follow up recommendations: none except f/u w/ PCP  Interval history: Stomach still hurting, but has had low appetite.   - Drinking sufficient amount liquid (water, occasional gatorade, soda, tea) - Eating "bland foods" - 2 Jello cups, French fries (stomach hurt worse after eating), Sunchips - Denies vomiting.  Has had nausea and took Zofran several times. - Takes maalox only 1-2 x per week when she starts to feel sick. NOtes that it helps her symptoms, but hasn't taken regularly. - Clindamycin: has about 2 days left (4 pills per day, but may have missed a couple doses) - got these from dentist for abscess and tooth extraction.  Has also seemed to help abdominal symptoms. Pt did not go home w/ any additional antibiotics from hospital.   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  States she wants to quit.  Prefers using nicotine patches for quitting. No cravings when wearing the patch.  Has had nicotine patch in past.  Smokes 1/4 ppd and has smoked for 10 years.   Hypertension history: Has taken HCTZ in past, but was stopped - pt unsure why.  Past Medical History:  Diagnosis Date  . Back pain   . Back pain   . GERD (gastroesophageal reflux disease)   . Hypertension   .  Mesenteric adenitis   . Obesity   . PCOS (polycystic ovarian syndrome)   . Tobacco abuse    Past Surgical History:  Procedure Laterality Date  . CARPAL TUNNEL RELEASE Left ~ 2013  . ESOPHAGOGASTRODUODENOSCOPY N/A 01/25/2015   Procedure: ESOPHAGOGASTRODUODENOSCOPY (EGD);  Surgeon: Irene Shipper, MD;  Location: [facility] ENDOSCOPY;  Service: Endoscopy;  Laterality: N/A;  . ESOPHAGOGASTRODUODENOSCOPY (EGD) WITH PROPOFOL N/A 03/13/2016   Procedure: ESOPHAGOGASTRODUODENOSCOPY (EGD) WITH PROPOFOL;  Surgeon: Lucilla Lame, MD;  Location: [facility] ENDOSCOPY;  Service: Endoscopy;  Laterality: N/A;  . FRACTURE SURGERY    . ORIF ANKLE FRACTURE Left 1990's   Social History   Social History  . Marital status: Single    Spouse name: N/A  . Number of children: N/A  . Years of education: N/A   Occupational History  . Not on file.   Social History Main Topics  . Smoking status: Current Every Day Smoker    Packs/day: 0.25    Years: 10.00    Types: Cigarettes  . Smokeless tobacco: Never Used  . Alcohol use No  . Drug use: No  . Sexual activity: Yes    Birth control/ protection: Condom, Pill   Other Topics Concern  . Not on file   Social History Narrative  . No narrative on file   Family History  Problem Relation Age of Onset  . Hypertension Mother   . Hypertension Father   . Diabetes Father   . Heart disease Father   . Hypertension Sister   . Throat  cancer Maternal Grandfather   . Stroke Paternal Grandmother   . Diabetes Paternal Grandmother   . Ovarian cancer Neg Hx   . Colon cancer Neg Hx   . Breast cancer Neg Hx    Current Outpatient Prescriptions on File Prior to Visit  Medication Sig  . aluminum-magnesium hydroxide-simethicone (MAALOX) 338-250-53 MG/5ML SUSP Take 30 mLs by mouth 4 (four) times daily -  before meals and at bedtime.  . clindamycin (CLEOCIN) 300 MG capsule Take 1 capsule by mouth 4 (four) times daily.   . metoCLOPramide (REGLAN) 5 MG tablet Take 1 tablet (5 mg total) by  mouth every 8 (eight) hours as needed for nausea or vomiting.  . ondansetron (ZOFRAN) 4 MG tablet Take 1 tablet (4 mg total) by mouth every 6 (six) hours.  . pantoprazole (PROTONIX) 40 MG tablet Take 1 tablet (40 mg total) by mouth 2 (two) times daily.  . norethindrone-ethinyl estradiol-iron (LOESTRIN FE 1.5/30) 1.5-30 MG-MCG tablet Take 1 tablet by mouth at bedtime.   No current facility-administered medications on file prior to visit.     Review of Systems  Constitutional: Positive for appetite change. Negative for chills and fever.  HENT: Negative.   Eyes: Negative.   Respiratory: Negative.   Cardiovascular: Positive for palpitations.       (last week)  Gastrointestinal: Positive for abdominal pain, constipation, diarrhea and nausea.  Endocrine: Positive for polydipsia.       Paresthesias in hands and feet  Genitourinary: Positive for menstrual problem.  Musculoskeletal: Negative.   Skin: Negative.        Oily skin  Allergic/Immunologic: Negative.   Neurological: Negative.   Hematological: Negative.   Psychiatric/Behavioral: Negative.    Per HPI unless specifically indicated above     Objective:    BP (!) 148/95 (BP Location: Right Arm, Patient Position: Sitting, Cuff Size: Large)   Temp 98.6 F (37 C) (Oral)   Ht 5\' 7"  (1.702 m)   Wt 226 lb 6.4 oz (102.7 kg)   LMP 04/05/2017 Comment: neg preg test today  BMI 35.46 kg/m   Wt Readings from Last 3 Encounters:  04/12/17 226 lb 6.4 oz (102.7 kg)  04/08/17 221 lb (100.2 kg)  03/12/17 224 lb 7 oz (101.8 kg)    Physical Exam  Constitutional: She is oriented to person, place, and time. She appears well-developed and well-nourished. No distress.  Obese, > central adiposity  HENT:  Head: Normocephalic and atraumatic.  Neck: Normal range of motion. Neck supple. No tracheal deviation present. No thyromegaly present.  Cardiovascular: Normal rate, regular rhythm and normal heart sounds.   Pulmonary/Chest: Effort normal. She  has rhonchi in the left upper field.  Rhonchi clears w/ cough  Abdominal: Soft. Normal appearance and bowel sounds are normal. There is no hepatosplenomegaly. There is tenderness in the right lower quadrant, epigastric area, suprapubic area and left lower quadrant. There is guarding. There is no rigidity, no rebound, no tenderness at McBurney's point and negative Murphy's sign.  Generalized tenderness also present, w/ greater pain at epigastric and lower abdominal regions  Lymphadenopathy:    She has no cervical adenopathy.  Neurological: She is alert and oriented to person, place, and time.  Skin: Skin is warm and dry.  Psychiatric: She has a normal mood and affect. Her behavior is normal. Judgment and thought content normal.    Results for orders placed or performed in visit on 04/12/17  Comprehensive metabolic panel  Result Value Ref Range   Glucose, Bld 82  65 - 99 mg/dL   BUN 9 7 - 25 mg/dL   Creat 0.88 0.50 - 1.10 mg/dL   BUN/Creatinine Ratio NOT APPLICABLE 6 - 22 (calc)   Sodium 140 135 - 146 mmol/L   Potassium 4.2 3.5 - 5.3 mmol/L   Chloride 105 98 - 110 mmol/L   CO2 26 20 - 32 mmol/L   Calcium 9.2 8.6 - 10.2 mg/dL   Total Protein 7.1 6.1 - 8.1 g/dL   Albumin 4.5 3.6 - 5.1 g/dL   Globulin 2.6 1.9 - 3.7 g/dL (calc)   AG Ratio 1.7 1.0 - 2.5 (calc)   Total Bilirubin 0.4 0.2 - 1.2 mg/dL   Alkaline phosphatase (APISO) 78 33 - 115 U/L   AST 15 10 - 30 U/L   ALT 18 6 - 29 U/L  CBC with Differential  Result Value Ref Range   WBC 11.7 (H) 3.8 - 10.8 Thousand/uL   RBC 4.69 3.80 - 5.10 Million/uL   Hemoglobin 13.7 11.7 - 15.5 g/dL   HCT 41.3 35.0 - 45.0 %   MCV 88.1 80.0 - 100.0 fL   MCH 29.2 27.0 - 33.0 pg   MCHC 33.2 32.0 - 36.0 g/dL   RDW 12.5 11.0 - 15.0 %   Platelets 337 140 - 400 Thousand/uL   MPV 10.7 7.5 - 12.5 fL   Neutro Abs 7,172 1,500 - 7,800 cells/uL   Lymphs Abs 3,604 850 - 3,900 cells/uL   WBC mixed population 667 200 - 950 cells/uL   Eosinophils Absolute 222  15 - 500 cells/uL   Basophils Absolute 35 0 - 200 cells/uL   Neutrophils Relative % 61.3 %   Total Lymphocyte 30.8 %   Monocytes Relative 5.7 %   Eosinophils Relative 1.9 %   Basophils Relative 0.3 %  TSH  Result Value Ref Range   TSH 0.86 mIU/L  Amylase  Result Value Ref Range   Amylase 44 21 - 101 U/L  Lipase  Result Value Ref Range   Lipase 9 7 - 60 U/L  Hemoglobin A1c  Result Value Ref Range   Hgb A1c MFr Bld 5.5 <5.7 % of total Hgb   Mean Plasma Glucose 111 (calc)   eAG (mmol/L) 6.2 (calc)      Assessment & Plan:   Problem List Items Addressed This Visit      Cardiovascular and Mediastinum   Essential hypertension    Uncontrolled hypertension.  BP goal < 138/80.  Pt has not been working on lifestyle modifications and is not currently taking medications. No current complications.  Plan: 1. START taking hydrochlorothiazide 12.5 mg once daily. 2. Obtain labs CMP today. 3. Encouraged heart healthy diet and increasing exercise to 30 minutes most days of the week. 4. Check BP 1-2 x per week at home, keep log, and bring to clinic at next appointment. 5. Follow up 1 month.        Relevant Medications   hydrochlorothiazide (HYDRODIURIL) 12.5 MG tablet     Digestive   Acute gastritis - Primary    Acute gastritis w/ slow improvement.  Pt has knowledge deficit about bland foods and appropriate foods to eat.  Seems to have adequate hydration today.  Possible electrolyte abnormality r/t poor nutritional intake and hypokalemia during hospitalization.  Plan: 1.CMP today evaluate electrolytes. 2. Reviewed bland foods.  Recommend healthy diet w/ low fat meals, slowly increasing types of foods as tolerated. 3. Encouraged to continue Maalox daily as recommended at hospital discharge. 4. Follow up as  needed.      Relevant Orders   Comprehensive metabolic panel (Completed)   CBC with Differential (Completed)     Other   Epigastric abdominal pain    See A/P acute  gastritis.      Relevant Orders   Comprehensive metabolic panel (Completed)   CBC with Differential (Completed)   Amylase (Completed)   Lipase (Completed)   Encounter for smoking cessation counseling    Pt desires to quit smoking.  States did not have cravings when using nicotine patch in hospital.  Would like to continue nicotine patch to stop smoking.  Discussion today >5 minutes (<10 minutes) specifically on counseling on risks of tobacco use, complications, treatment, smoking cessation.  Discussed Chantix, Wellbutrin, nicotine replacement, quit tips.  Plan: 1. Start nicotine patch 7 mg once daily.  Remove old patch and place new patch each day.  Do not smoke while using patch. 2. Follow up 4 weeks.       Relevant Medications   nicotine (NICODERM CQ - DOSED IN MG/24 HR) 7 mg/24hr patch    Other Visit Diagnoses    Low TSH level       Prior TSH low.  Recheck today.  If still elevated, proceed w/ endocrinology referral for eval and treat.   TSH - normal.  Continue to monitor annually.   Relevant Orders   TSH (Completed)   Screening for diabetes mellitus (DM)       Pt w/ elevated glucose on last labs.  A1c normal.   Relevant Orders   Hemoglobin A1c (Completed)   Needs flu shot       Adult requiring annual influenza vaccine today.  Agrees to vaccination.  Plan: 1. Give quadrivalent flu vaccine.   Relevant Orders   Flu Vaccine QUAD 6+ mos PF IM (Fluarix Quad PF) (Completed)   Encounter to establish care       History reviewed w/ patient.  Obtain prior records from [facility].      Meds ordered this encounter  Medications  . nicotine (NICODERM CQ - DOSED IN MG/24 HR) 7 mg/24hr patch    Sig: Place 1 patch (7 mg total) onto the skin daily.    Dispense:  28 patch    Refill:  0  . hydrochlorothiazide (HYDRODIURIL) 12.5 MG tablet    Sig: Take 1 tablet (12.5 mg total) by mouth daily.    Dispense:  90 tablet    Refill:  0    Follow up plan: Return  in about 4 weeks (around 05/10/2017) for Chronic diseases, abdominal pain.  Cassell Smiles, DNP, AGPCNP-BC Adult Gerontology Primary Care Nurse Practitioner [facility] 04/18/2017, 11:13 PM

## 2017-04-12 NOTE — Patient Instructions (Addendum)
Jasmine Mason, Thank you for coming in to clinic today.  1. START increasing your diet.   - Drink 1/2 gatorade/powerade each day. - Low fat meals, bland foods for now.  Increase your food but not your fat.  2. Continue your current medicines. Take maalox more frequently (at least every other day, but prefer every day until you are feeling better).  3. FOR your periods: Continue your follow up w/ GYN.  4. We will get blood today.   - For Lab Results, once available within 2-3 days of blood draw, you can can log in to MyChart online to view your results and a brief explanation.    5. For your smoking cessation: - START nicotine patch 7 mg. Place one patch on your skin every day, remove the old patch each day.  After 3 weeks, cut patch in half for 2 weeks, then stop.  6. Blood Pressure - GOAL < 130/80 -START hydrochlorothiazide 12.5 mg one daily while awake.  Please schedule a follow-up appointment with Cassell Smiles, AGNP. Return in about 4 weeks (around 05/10/2017) for Chronic diseases, abdominal pain.  If you have any other questions or concerns, please feel free to call the clinic or send a message through [facility]. You may also schedule an earlier appointment if necessary.  You will receive a survey after today's visit either digitally by e-mail or paper by [employer]. Your experiences and feedback matter to [place].  Please respond so we know how we are doing as we provide care for you.   Cassell Smiles, DNP, AGNP-BC Adult Gerontology Nurse Practitioner [place]

## 2017-04-13 LAB — CBC WITH DIFFERENTIAL/PLATELET
Basophils Absolute: 35 cells/uL (ref 0–200)
Basophils Relative: 0.3 %
Eosinophils Absolute: 222 cells/uL (ref 15–500)
Eosinophils Relative: 1.9 %
HCT: 41.3 % (ref 35.0–45.0)
Hemoglobin: 13.7 g/dL (ref 11.7–15.5)
Lymphs Abs: 3604 cells/uL (ref 850–3900)
MCH: 29.2 pg (ref 27.0–33.0)
MCHC: 33.2 g/dL (ref 32.0–36.0)
MCV: 88.1 fL (ref 80.0–100.0)
MPV: 10.7 fL (ref 7.5–12.5)
Monocytes Relative: 5.7 %
Neutro Abs: 7172 cells/uL (ref 1500–7800)
Neutrophils Relative %: 61.3 %
Platelets: 337 10*3/uL (ref 140–400)
RBC: 4.69 10*6/uL (ref 3.80–5.10)
RDW: 12.5 % (ref 11.0–15.0)
Total Lymphocyte: 30.8 %
WBC mixed population: 667 cells/uL (ref 200–950)
WBC: 11.7 10*3/uL — ABNORMAL HIGH (ref 3.8–10.8)

## 2017-04-13 LAB — COMPREHENSIVE METABOLIC PANEL
AG Ratio: 1.7 (calc) (ref 1.0–2.5)
ALT: 18 U/L (ref 6–29)
AST: 15 U/L (ref 10–30)
Albumin: 4.5 g/dL (ref 3.6–5.1)
Alkaline phosphatase (APISO): 78 U/L (ref 33–115)
BUN: 9 mg/dL (ref 7–25)
CO2: 26 mmol/L (ref 20–32)
Calcium: 9.2 mg/dL (ref 8.6–10.2)
Chloride: 105 mmol/L (ref 98–110)
Creat: 0.88 mg/dL (ref 0.50–1.10)
Globulin: 2.6 g/dL (calc) (ref 1.9–3.7)
Glucose, Bld: 82 mg/dL (ref 65–99)
Potassium: 4.2 mmol/L (ref 3.5–5.3)
Sodium: 140 mmol/L (ref 135–146)
Total Bilirubin: 0.4 mg/dL (ref 0.2–1.2)
Total Protein: 7.1 g/dL (ref 6.1–8.1)

## 2017-04-13 LAB — LIPASE: Lipase: 9 U/L (ref 7–60)

## 2017-04-13 LAB — HEMOGLOBIN A1C
Hgb A1c MFr Bld: 5.5 % of total Hgb (ref <5.7)
Mean Plasma Glucose: 111 (calc)
eAG (mmol/L): 6.2 (calc)

## 2017-04-13 LAB — TSH: TSH: 0.86 mIU/L

## 2017-04-13 LAB — AMYLASE: Amylase: 44 U/L (ref 21–101)

## 2017-04-16 NOTE — Assessment & Plan Note (Signed)
See A/P acute gastritis.

## 2017-04-16 NOTE — Assessment & Plan Note (Signed)
Acute gastritis w/ slow improvement.  Pt has knowledge deficit about bland foods and appropriate foods to eat.  Seems to have adequate hydration today.  Possible electrolyte abnormality r/t poor nutritional intake and hypokalemia during hospitalization.  Plan: 1.CMP today evaluate electrolytes. 2. Reviewed bland foods.  Recommend healthy diet w/ low fat meals, slowly increasing types of foods as tolerated. 3. Encouraged to continue Maalox daily as recommended at hospital discharge. 4. Follow up as needed.

## 2017-04-18 DIAGNOSIS — I10 Essential (primary) hypertension: Secondary | ICD-10-CM | POA: Insufficient documentation

## 2017-04-18 DIAGNOSIS — Z716 Tobacco abuse counseling: Secondary | ICD-10-CM | POA: Insufficient documentation

## 2017-04-18 NOTE — Assessment & Plan Note (Signed)
Uncontrolled hypertension.  BP goal < 138/80.  Pt has not been working on lifestyle modifications and is not currently taking medications. No current complications.  Plan: 1. START taking hydrochlorothiazide 12.5 mg once daily. 2. Obtain labs CMP today. 3. Encouraged heart healthy diet and increasing exercise to 30 minutes most days of the week. 4. Check BP 1-2 x per week at home, keep log, and bring to clinic at next appointment. 5. Follow up 1 month.

## 2017-04-18 NOTE — Assessment & Plan Note (Addendum)
Pt desires to quit smoking.  States did not have cravings when using nicotine patch in hospital.  Would like to continue nicotine patch to stop smoking.  Discussion today >10 minutes specifically on counseling on risks of tobacco use, complications, treatment, smoking cessation.  Discussed Chantix, Wellbutrin, nicotine replacement, quit tips.  Plan: 1. Start nicotine patch 7 mg once daily.  Remove old patch and place new patch each day.  Do not smoke while using patch. 2. Follow up 4 weeks.

## 2017-05-10 ENCOUNTER — Ambulatory Visit: Payer: BLUE CROSS/BLUE SHIELD | Admitting: Nurse Practitioner

## 2017-05-27 ENCOUNTER — Other Ambulatory Visit: Payer: Self-pay | Admitting: Nurse Practitioner

## 2017-05-27 DIAGNOSIS — I10 Essential (primary) hypertension: Secondary | ICD-10-CM

## 2017-05-30 ENCOUNTER — Other Ambulatory Visit: Payer: Self-pay | Admitting: Nurse Practitioner

## 2017-06-12 ENCOUNTER — Encounter: Payer: BLUE CROSS/BLUE SHIELD | Admitting: Obstetrics and Gynecology

## 2017-06-26 ENCOUNTER — Emergency Department
Admission: EM | Admit: 2017-06-26 | Discharge: 2017-06-26 | Discharge status: Against Medical Advice | Payer: BLUE CROSS/BLUE SHIELD | Attending: Emergency Medicine | Admitting: Emergency Medicine

## 2017-06-26 ENCOUNTER — Encounter: Payer: Self-pay | Admitting: Emergency Medicine

## 2017-06-26 ENCOUNTER — Telehealth: Payer: Self-pay | Admitting: Nurse Practitioner

## 2017-06-26 ENCOUNTER — Emergency Department: Payer: BLUE CROSS/BLUE SHIELD

## 2017-06-26 DIAGNOSIS — F17210 Nicotine dependence, cigarettes, uncomplicated: Secondary | ICD-10-CM | POA: Insufficient documentation

## 2017-06-26 DIAGNOSIS — R079 Chest pain, unspecified: Secondary | ICD-10-CM | POA: Diagnosis not present

## 2017-06-26 DIAGNOSIS — Z79899 Other long term (current) drug therapy: Secondary | ICD-10-CM | POA: Diagnosis not present

## 2017-06-26 DIAGNOSIS — I10 Essential (primary) hypertension: Secondary | ICD-10-CM | POA: Insufficient documentation

## 2017-06-26 LAB — CBC
HEMATOCRIT: 44.4 % (ref 35.0–47.0)
Hemoglobin: 15 g/dL (ref 12.0–16.0)
MCH: 29.9 pg (ref 26.0–34.0)
MCHC: 33.8 g/dL (ref 32.0–36.0)
MCV: 88.7 fL (ref 80.0–100.0)
PLATELETS: 373 10*3/uL (ref 150–440)
RBC: 5.01 MIL/uL (ref 3.80–5.20)
RDW: 13.4 % (ref 11.5–14.5)
WBC: 10.7 10*3/uL (ref 3.6–11.0)

## 2017-06-26 LAB — TROPONIN I: Troponin I: <0.03 ng/mL (ref <0.03)

## 2017-06-26 LAB — BASIC METABOLIC PANEL
Anion gap: 8 (ref 5–15)
BUN: 13 mg/dL (ref 6–20)
CALCIUM: 9.1 mg/dL (ref 8.9–10.3)
CO2: 23 mmol/L (ref 22–32)
CREATININE: 0.78 mg/dL (ref 0.44–1.00)
Chloride: 106 mmol/L (ref 101–111)
GFR calc Af Amer: >60 mL/min (ref >60)
GLUCOSE: 121 mg/dL — AB (ref 65–99)
POTASSIUM: 3.9 mmol/L (ref 3.5–5.1)
SODIUM: 137 mmol/L (ref 135–145)

## 2017-06-26 LAB — FIBRIN DERIVATIVES D-DIMER (ARMC ONLY): FIBRIN DERIVATIVES D-DIMER (ARMC): 198.9 ng{FEU}/mL (ref 0.00–499.00)

## 2017-06-26 MED ORDER — METOCLOPRAMIDE HCL 5 MG/ML IJ SOLN
10.0000 mg | Freq: Once | INTRAMUSCULAR | Status: AC
  Administered 2017-06-26: 10 mg via INTRAVENOUS
  Filled 2017-06-26: qty 2

## 2017-06-26 MED ORDER — SUCRALFATE 1 G PO TABS: 1.0000 g | ORAL_TABLET | Freq: Four times a day (QID) | ORAL | 1 refills | Status: DC

## 2017-06-26 MED ORDER — FAMOTIDINE 20 MG PO TABS
40.0000 mg | ORAL_TABLET | Freq: Once | ORAL | Status: AC
  Administered 2017-06-26: 40 mg via ORAL
  Filled 2017-06-26: qty 2

## 2017-06-26 MED ORDER — METOCLOPRAMIDE HCL 10 MG PO TABS: 10.0000 mg | ORAL_TABLET | Freq: Four times a day (QID) | ORAL | 0 refills | Status: DC | PRN

## 2017-06-26 MED ORDER — GI COCKTAIL ~~LOC~~
30.0000 mL | ORAL | Status: AC
  Administered 2017-06-26: 30 mL via ORAL
  Filled 2017-06-26: qty 30

## 2017-06-26 NOTE — ED Notes (Signed)
Called to pt room. Pt states she has to leave immediately because her uncle is under hospice care is dying and she needs to be with family. Dr. Joni Fears notified.

## 2017-06-26 NOTE — Telephone Encounter (Signed)
Pt called at 0759 on 06/26/17.  [place] received the call and triaged.    Pt states she has ben vomiting, BP has been up, was supposed to go to ER, but has no one to drive her.  Stomach hurting, gastritis in chest and throat.  She is wondering about what to do this morning. - She also has chest hurtin and right arm is numb. - Visible sweat on face or dripping down face --> affirmed question at 8:06:32.  Pt was instructed to call 9-1-1.  Nursing at the call center was unable to followup w/ pt to confirm if she went to ER.  I attempted to call pt at [place].  Pt did not answer.   - Is pt taking pantoprozole 40 mg bid? If so, she likely needs H2 blocker added and may also benefit from carafate qid.   - Recommend GI referral for additional workup and evaluation if pt has another contact with clinic or via ED.

## 2017-06-26 NOTE — ED Triage Notes (Signed)
PT to ED via POV with c/o intermit CP since yesterday , and RT hand numbness that started aprox 7am. Pt ambulatory, speech clear, stroke screen neg. VS stable.

## 2017-06-26 NOTE — ED Provider Notes (Addendum)
[facility] Emergency Department Provider Note  ____________________________________________  Time seen: Approximately 10:29 AM  I have reviewed the triage vital signs and the nursing notes.   HISTORY  Chief Complaint Chest Pain    HPI Jasmine Mason is a 31 y.o. female who complains of intermittent chest pain in the mid chest, feels like burning that started yesterday. Intermittent lasting a few minutes at a time. Worse lying supine, no other aggravating or alleviating factors. Not exertional, not pleuritic. Associated with a few episodes of vomiting in the morning only, no diaphoresis. No shortness of breath. No recent travel, hospitalizations or surgeries. No history of DVT or PE. The patient does take a birth control pill and smokes.She has a history of GERD but this was not relieved by her usual Protonix   Past Medical History:  Diagnosis Date  . Back pain   . Back pain   . GERD (gastroesophageal reflux disease)   . Hypertension   . Mesenteric adenitis   . Obesity   . PCOS (polycystic ovarian syndrome)   . Tobacco abuse      Patient Active Problem List   Diagnosis Date Noted  . Encounter for smoking cessation counseling 04/18/2017  . Essential hypertension 04/18/2017  . Epigastric abdominal pain   . Acute gastritis 09/20/2016  . Nausea with vomiting   . Hiatal hernia   . Uncontrollable vomiting 03/10/2016  . Abdominal pain, epigastric   . GERD (gastroesophageal reflux disease) 01/24/2015  . Hypokalemia 01/24/2015  . Abdominal pain 01/24/2015  . Leukocytosis 01/24/2015  . Chest pain 01/24/2015  . Cough 01/24/2015  . Pain of left calf 01/24/2015  . Elevated troponin 01/24/2015  . Obesity   . Mesenteric adenitis   . Tobacco abuse      Past Surgical History:  Procedure Laterality Date  . CARPAL TUNNEL RELEASE Left ~ 2013  . ESOPHAGOGASTRODUODENOSCOPY N/A 01/25/2015   Procedure: ESOPHAGOGASTRODUODENOSCOPY (EGD);  Surgeon: Irene Shipper,  MD;  [facility] ENDOSCOPY;  Service: Endoscopy;  Laterality: N/A;  . ESOPHAGOGASTRODUODENOSCOPY (EGD) WITH PROPOFOL N/A 03/13/2016   Procedure: ESOPHAGOGASTRODUODENOSCOPY (EGD) WITH PROPOFOL;  Surgeon: Lucilla Lame, MD;  Location: [facility] ENDOSCOPY;  Service: Endoscopy;  Laterality: N/A;  . FRACTURE SURGERY    . ORIF ANKLE FRACTURE Left 1990's     Prior to Admission medications   Medication Sig Start Date End Date Taking? Authorizing Provider  doxycycline (VIBRAMYCIN) 50 MG capsule Take 1 capsule by mouth daily. 05/27/17  Yes [provider]  hydrochlorothiazide (HYDRODIURIL) 12.5 MG tablet TAKE 1 TABLET(12.5 MG) BY MOUTH DAILY 05/27/17  Yes Mikey College, NP  HYDROcodone-acetaminophen (NORCO/VICODIN) 5-325 MG tablet Take 1-2 tablets by mouth every 6 (six) hours as needed. 09/27/14  Yes [provider]  pantoprazole (PROTONIX) 40 MG tablet TAKE 1 TABLET(40 MG) BY MOUTH TWICE DAILY 05/30/17  Yes Mikey College, NP  aluminum-magnesium hydroxide-simethicone (MAALOX) 540-086-76 MG/5ML SUSP Take 30 mLs by mouth 4 (four) times daily -  before meals and at bedtime.    [provider]  metoCLOPramide (REGLAN) 10 MG tablet Take 1 tablet (10 mg total) by mouth every 6 (six) hours as needed. 06/26/17   Carrie Mew, MD  nicotine (NICODERM CQ - DOSED IN MG/24 HR) 7 mg/24hr patch Place 1 patch (7 mg total) onto the skin daily. 04/12/17   Mikey College, NP  norethindrone-ethinyl estradiol-iron (LOESTRIN FE 1.5/30) 1.5-30 MG-MCG tablet Take 1 tablet by mouth at bedtime. 03/12/17 06/26/17  Harlin Heys, MD  ondansetron (  ZOFRAN) 4 MG tablet Take 1 tablet (4 mg total) by mouth every 6 (six) hours. 02/16/17   Volanda Napoleon, PA-C  sucralfate (CARAFATE) 1 g tablet Take 1 tablet (1 g total) by mouth 4 (four) times daily. 06/26/17   Carrie Mew, MD     Allergies Patient has no known allergies.   Family History  Problem Relation Age of Onset  .  Hypertension Mother   . Hypertension Father   . Diabetes Father   . Heart disease Father   . Hypertension Sister   . Throat cancer Maternal Grandfather   . Stroke Paternal Grandmother   . Diabetes Paternal Grandmother   . Ovarian cancer Neg Hx   . Colon cancer Neg Hx   . Breast cancer Neg Hx     Social History Social History   Tobacco Use  . Smoking status: Current Every Day Smoker    Packs/day: 0.25    Years: 10.00    Pack years: 2.50    Types: Cigarettes  . Smokeless tobacco: Never Used  Substance Use Topics  . Alcohol use: No    Alcohol/week: 0.0 oz  . Drug use: No    Review of Systems  Constitutional:   No fever or chills.  ENT:   No sore throat. No rhinorrhea. Cardiovascular:   Positive as above chest pain without syncope. Respiratory:   No dyspnea or cough. Gastrointestinal:   Negative for abdominal pain, positive for a few episodes of vomiting Musculoskeletal:   Negative for focal pain or swelling All other systems reviewed and are negative except as documented above in ROS and HPI.  ____________________________________________   PHYSICAL EXAM:  VITAL SIGNS: ED Triage Vitals [06/26/17 0848]  Enc Vitals Group     BP (!) 141/93     Pulse Rate (!) 108     Resp 16     Temp 98.6 F (37 C)     Temp Source Oral     SpO2 100 %     Weight 212 lb (96.2 kg)     Height 5\' 7"  (1.702 m)     Head Circumference      Peak Flow      Pain Score 8     Pain Loc      Pain Edu?      Excl. in [place]?     Vital signs reviewed, nursing assessments reviewed.   Constitutional:   Alert and oriented. Well appearing and in no distress. Eyes:   No scleral icterus.  EOMI. No nystagmus. No conjunctival pallor. PERRL. ENT   Head:   Normocephalic and atraumatic.   Nose:   No congestion/rhinnorhea.    Mouth/Throat:   MMM, no pharyngeal erythema. No peritonsillar mass.    Neck:   No meningismus. Full ROM. Hematological/Lymphatic/Immunilogical:   No cervical  lymphadenopathy. Cardiovascular:   Tachycardia heart rate 100. Symmetric bilateral radial and DP pulses.  No murmurs.  Respiratory:   Normal respiratory effort without tachypnea/retractions. Breath sounds are clear and equal bilaterally. No wheezes/rales/rhonchi. Gastrointestinal:   Soft and nontender. Non distended. There is no CVA tenderness.  No rebound, rigidity, or guarding. Genitourinary:   deferred Musculoskeletal:   Normal range of motion in all extremities. No joint effusions.  No lower extremity tenderness.  No edema. Neurologic:   Normal speech and language.  Motor grossly intact. No gross focal neurologic deficits are appreciated.  Skin:    Skin is warm, dry and intact. No rash noted.  No petechiae, purpura, or bullae.  ____________________________________________    LABS (pertinent positives/negatives) (all labs ordered are listed, but only abnormal results are displayed) Labs Reviewed  BASIC METABOLIC PANEL - Abnormal; Notable for the following components:      Result Value   Glucose, Bld 121 (*)    All other components within normal limits  CBC  TROPONIN I  FIBRIN DERIVATIVES D-DIMER ([facility] ONLY)  POC URINE PREG, ED   ____________________________________________   EKG  Interpreted by me Sinus tachycardia rate 106, normal axis and intervals. Slight inferior Q waves with T wave inversion in lead 3. No acute ischemic changes.  ____________________________________________    RADIOLOGY  No results found.  ____________________________________________   PROCEDURES Procedures  ____________________________________________   DIFFERENTIAL DIAGNOSIS  MI with atypical presentation, PE, pneumonia, GERD  CLINICAL IMPRESSION / ASSESSMENT AND PLAN / ED COURSE  Pertinent labs & imaging results that were available during my care of the patient were reviewed by me and considered in my medical decision making (see chart for details).   Patient presents with  intermittent chest pain, not exertional, worse lying supine. Most likely GERD, but with her birth control use, smoking, vital sign abnormalities, there is some suspicion of PE. Can be evaluated with a d-dimer. I would also check a second troponin while also giving antacids as a therapeutic trial. We'll reassess once lab workup is complete  Clinical Course as of Jun 26 1028  Wed Jun 26, 2017  1027 Pt just received a phone call that family member of hers is dying and in hospice. She requests to be discharged immediately. Has mdm capacity. Will dc ama.  [PS]    Clinical Course User Index [PS] Carrie Mew, MD     ____________________________________________   FINAL CLINICAL IMPRESSION(S) / ED DIAGNOSES    Final diagnoses:  Nonspecific chest pain      This SmartLink is deprecated. Use AVSMEDLIST instead to display the medication list for a patient.   Portions of this note were generated with dragon dictation software. Dictation errors may occur despite best attempts at proofreading.    Carrie Mew, MD 06/26/17 1036    ----------------------------------------- 3:18 PM on 06/26/2017 -----------------------------------------  D-dimer negative.    Carrie Mew, MD 06/26/17 [phone]

## 2017-06-27 ENCOUNTER — Other Ambulatory Visit: Payer: Self-pay

## 2017-06-27 ENCOUNTER — Ambulatory Visit
Admission: RE | Admit: 2017-06-27 | Discharge: 2017-06-27 | Disposition: A | Discharge status: Home / Self Care | Payer: BLUE CROSS/BLUE SHIELD | Source: Ambulatory Visit | Attending: Nurse Practitioner | Admitting: Nurse Practitioner

## 2017-06-27 ENCOUNTER — Ambulatory Visit: Payer: BLUE CROSS/BLUE SHIELD | Admitting: Nurse Practitioner

## 2017-06-27 ENCOUNTER — Encounter: Payer: Self-pay | Admitting: Nurse Practitioner

## 2017-06-27 VITALS — BP 113/72 | HR 85 | Temp 98.0°F | Ht 67.0 in | Wt 237.2 lb

## 2017-06-27 DIAGNOSIS — J00 Acute nasopharyngitis [common cold]: Secondary | ICD-10-CM

## 2017-06-27 DIAGNOSIS — K5909 Other constipation: Secondary | ICD-10-CM | POA: Diagnosis not present

## 2017-06-27 DIAGNOSIS — R0789 Other chest pain: Secondary | ICD-10-CM

## 2017-06-27 DIAGNOSIS — E282 Polycystic ovarian syndrome: Secondary | ICD-10-CM

## 2017-06-27 DIAGNOSIS — R059 Cough, unspecified: Secondary | ICD-10-CM

## 2017-06-27 DIAGNOSIS — R05 Cough: Secondary | ICD-10-CM

## 2017-06-27 DIAGNOSIS — R1114 Bilious vomiting: Secondary | ICD-10-CM | POA: Diagnosis not present

## 2017-06-27 DIAGNOSIS — K2900 Acute gastritis without bleeding: Secondary | ICD-10-CM | POA: Diagnosis not present

## 2017-06-27 MED ORDER — NORETHIN ACE-ETH ESTRAD-FE 1.5-30 MG-MCG PO TABS: 1.0000 | ORAL_TABLET | Freq: Every day | ORAL | 11 refills | Status: DC

## 2017-06-27 MED ORDER — FAMOTIDINE 20 MG PO TABS: 20.0000 mg | ORAL_TABLET | Freq: Two times a day (BID) | ORAL | 5 refills | Status: DC

## 2017-06-27 MED ORDER — PROMETHAZINE HCL 12.5 MG PO TABS: 6.2500 mg | ORAL_TABLET | Freq: Four times a day (QID) | ORAL | 0 refills | Status: DC | PRN

## 2017-06-27 MED ORDER — DOCUSATE SODIUM 100 MG PO CAPS: 100.0000 mg | ORAL_CAPSULE | Freq: Two times a day (BID) | ORAL | 0 refills | Status: DC

## 2017-06-27 MED ORDER — PROMETHAZINE HCL 25 MG/ML IJ SOLN
12.5000 mg | Freq: Once | INTRAMUSCULAR | Status: AC
  Administered 2017-06-27: 12.5 mg via INTRAMUSCULAR

## 2017-06-27 MED ORDER — FLUTICASONE PROPIONATE 50 MCG/ACT NA SUSP: 2.0000 | Freq: Every day | NASAL | 6 refills | Status: DC

## 2017-06-27 NOTE — Patient Instructions (Addendum)
Jasmine Mason, Thank you for coming in to clinic today.  1. You have unresolved gastritis with possible intermittent bleeding. - GI appointment is needed: they will call you to schedule.  - Make sure you are taking both pantoprazole and famotidine twice daily. - Pick up your sucralfate from the pharmacy and start using it before meals and at bedtime. - For your constipation, stop metoclopromide and start docusate sodium(Colace) 100 mg tablet twice daily.  OTC medication from your pharmacy.   2. You also have Upper respiratory infection - viral rhinosinusitis also known as a common cold. - this increased mucous, cough is likely irritating your gastritis and worsening your symptoms there.  - Your cough and chest pain could also be from pneumonia - We are getting an Xray today to confirm this.    Your cold will most likely run it's course in 7 to 10 days. Recommend good hand washing. - If congestion is worse, start OTC Mucinex (or may try Mucinex-DM for cough) up to 7-10 days then stop - May also use flonase 2 sprays in each nostril once daily. - Drink plenty of fluids to improve congestion - You may try over the counter Nasal Saline spray (Simply Saline, Ocean Spray) as needed to reduce congestion. - Drink warm herbal tea with honey for sore throat. - Start taking Tylenol extra strength 1 to 2 tablets every 6-8 hours for aches or fever/chills for next few days as needed.  Do not take more than 3,000 mg in 24 hours from all medicines.  May take Ibuprofen as well if tolerated 200-400mg  every 8 hours as needed.  If symptoms significantly worsening with persistent fevers/chills despite tylenol/ibpurofen, nausea, vomiting unable to tolerate food/fluids or medicine, body aches, or shortness of breath, sinus pain pressure or worsening productive cough, then follow-up for re-evaluation, may seek more immediate care at Urgent Care or ED if more concerned for emergency.  Please schedule a follow-up  appointment with Cassell Smiles, AGNP. Return 5-7 days if symptoms worsen or fail to improve AND in 1 month for gastritis.  If you have any other questions or concerns, please feel free to call the clinic or send a message through [place]. You may also schedule an earlier appointment if necessary.  You will receive a survey after today's visit either digitally by e-mail or paper by [employer]. Your experiences and feedback matter to [place].  Please respond so we know how we are doing as we provide care for you.   Cassell Smiles, DNP, AGNP-BC Adult Gerontology Nurse Practitioner [facility]    Gastritis, Adult Gastritis is inflammation of the stomach. There are two kinds of gastritis:  Acute gastritis. This kind develops suddenly.  Chronic gastritis. This kind lasts for a long time.  Gastritis happens when the lining of the stomach becomes weak or gets damaged. Without treatment, gastritis can lead to stomach bleeding and ulcers. What are the causes? This condition may be caused by:  An infection.  Drinking too much alcohol.  Certain medicines.  Having too much acid in the stomach.  A disease of the intestines or stomach.  Stress.  What are the signs or symptoms? Symptoms of this condition include:  Pain or a burning in the upper abdomen.  Nausea.  Vomiting.  An uncomfortable feeling of fullness after eating.  In some cases, there are no symptoms. How is this diagnosed? This condition may be diagnosed with:  A description of your symptoms.  A physical exam.  Tests.  These can include: ? Blood tests. ? Stool tests. ? A test in which a thin, flexible instrument with a light and camera on the end is passed down the esophagus and into the stomach (upper endoscopy). ? A test in which a sample of tissue is taken for testing (biopsy).  How is this treated? This condition may be treated with medicines. If the condition is caused by a bacterial  infection, you may be given antibiotic medicines. If it is caused by too much acid in the stomach, you may get medicines called H2 blockers, proton pump inhibitors, or antacids. Treatment may also involve stopping the use of certain medicines, such as aspirin, ibuprofen, or other nonsteroidal anti-inflammatory drugs (NSAIDs). Follow these instructions at home:  Take over-the-counter and prescription medicines only as told by your health care provider.  If you were prescribed an antibiotic, take it as told by your health care provider. Do not stop taking the antibiotic even if you start to feel better.  Drink enough fluid to keep your urine clear or pale yellow.  Eat small, frequent meals instead of large meals. Contact a health care provider if:  Your symptoms get worse.  Your symptoms return after treatment. Get help right away if:  You vomit blood or material that looks like coffee grounds.  You have black or dark red stools.  You are unable to keep fluids down.  Your abdominal pain gets worse.  You have a fever.  You do not feel better after 1 week. This information is not intended to replace advice given to you by your health care provider. Make sure you discuss any questions you have with your health care provider. Document Released: 07/03/2001 Document Revised: 03/07/2016 Document Reviewed: 04/02/2015 Elsevier Interactive Patient Education  [employer].

## 2017-06-27 NOTE — Progress Notes (Signed)
Subjective:    Patient ID: Jasmine Mason, female    DOB: May 14, 1986, 31 y.o.   MRN: [id number]  Jasmine Mason is a 31 y.o. female presenting on 06/27/2017 for Chest Pain (stomach discomfort,nauseated, and blurry vision x 3 days )   HPI Gastritis Patient reports her gastritis has not gotten better since initially being treated.  She is only intermittently using Maalox, metoclopramide, ondansetron.  She does report taking her pantoprazole twice daily without missed doses.  As reviewed in her last appointment with for gastritis, patient is not following dietary guidelines.  Over the last 3 days patient has had significantly increased nausea and vomiting with worsening stomach pain and heartburn. - Emesis (clear green-yellow) today.  Has had coffee ground emesis yesterday morning. - She has not been able to eat food and keep it down.  Also has decreased appetite.  URI/Cough -Patient reports having 4-5 days of nasal congestion, cough, throat irritation.  Over the last 3 days she has had worsening stomach discomfort, nausea, blurry vision, and increased chest pain.  Chest pain is described as pressure that is only present with cough.  Patient also has increased heartburn with burning now in clinic. - Patient does report subjective fever w/ chills and sweats, but no fever this am on exam.   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    Social History   Tobacco Use  . Smoking status: Current Every Day Smoker    Packs/day: 0.25    Years: 10.00    Pack years: 2.50    Types: Cigarettes  . Smokeless tobacco: Never Used  Substance Use Topics  . Alcohol use: No    Alcohol/week: 0.0 oz  . Drug use: No    Review of Systems Per HPI unless specifically indicated above     Objective:    BP 113/72 (BP Location: Right Arm, Patient Position: Sitting, Cuff Size: Large)   Pulse 85   Temp 98 F (36.7 C) (Oral)   Ht 5\' 7"  (1.702 m)    Wt 237 lb 3.2 oz (107.6 kg)   LMP 06/12/2017   BMI 37.15 kg/m   Wt Readings from Last 3 Encounters:  06/27/17 237 lb 3.2 oz (107.6 kg)  06/26/17 212 lb (96.2 kg)  04/12/17 226 lb 6.4 oz (102.7 kg)    Physical Exam  Constitutional: She is oriented to person, place, and time. She appears well-developed and well-nourished. She has a sickly appearance. She appears distressed (mildly).  HENT:  Head: Normocephalic and atraumatic.  Right Ear: Hearing, tympanic membrane, external ear and ear canal normal.  Left Ear: Hearing, tympanic membrane, external ear and ear canal normal.  Nose: Mucosal edema and rhinorrhea present. Right sinus exhibits maxillary sinus tenderness and frontal sinus tenderness. Left sinus exhibits maxillary sinus tenderness and frontal sinus tenderness.  Mouth/Throat: Uvula is midline, oropharynx is clear and moist and mucous membranes are normal.  Eyes: Conjunctivae and EOM are normal. Pupils are equal, round, and reactive to light. Lids are everted and swept, no foreign bodies found.  Neck: Normal range of motion. No JVD present. No tracheal deviation present.  Cardiovascular: Normal rate, regular rhythm and normal heart sounds.  Pulmonary/Chest: Effort normal and breath sounds normal. No respiratory distress.  Abdominal: Soft. Normal appearance. There is no hepatosplenomegaly. There is tenderness (generalized w/ more tenderness in epigastric region). There is no rigidity, no rebound, no guarding, no CVA tenderness, no tenderness at McBurney's point and negative  Murphy's sign. No hernia.  Lymphadenopathy:    She has cervical adenopathy.  Neurological: She is alert and oriented to person, place, and time.  Skin: Skin is warm and dry.  Psychiatric: She has a normal mood and affect. Her behavior is normal.  Vitals reviewed.   Results for orders placed or performed during the hospital encounter of 28/78/67  Basic metabolic panel  Result Value Ref Range   Sodium 137 135 -  145 mmol/L   Potassium 3.9 3.5 - 5.1 mmol/L   Chloride 106 101 - 111 mmol/L   CO2 23 22 - 32 mmol/L   Glucose, Bld 121 (H) 65 - 99 mg/dL   BUN 13 6 - 20 mg/dL   Creatinine, Ser 0.78 0.44 - 1.00 mg/dL   Calcium 9.1 8.9 - 10.3 mg/dL   GFR calc non Af Amer >60 >60 mL/min   GFR calc Af Amer >60 >60 mL/min   Anion gap 8 5 - 15  CBC  Result Value Ref Range   WBC 10.7 3.6 - 11.0 K/uL   RBC 5.01 3.80 - 5.20 MIL/uL   Hemoglobin 15.0 12.0 - 16.0 g/dL   HCT 44.4 35.0 - 47.0 %   MCV 88.7 80.0 - 100.0 fL   MCH 29.9 26.0 - 34.0 pg   MCHC 33.8 32.0 - 36.0 g/dL   RDW 13.4 11.5 - 14.5 %   Platelets 373 150 - 440 K/uL  Troponin I  Result Value Ref Range   Troponin I <0.03 <0.03 ng/mL  Fibrin derivatives D-Dimer ([facility] only)  Result Value Ref Range   Fibrin derivatives D-dimer ([facility]) 198.90 0.00 - 499.00 ng/mL (FEU)      Assessment & Plan:   Problem List Items Addressed This Visit      Digestive   Nausea with vomiting See AP acute gastritis below.   Relevant Medications   promethazine (PHENERGAN) injection 12.5 mg (Completed)   promethazine (PHENERGAN) 12.5 MG tablet   Acute gastritis - Primary Subacute problem w/ acute worsening over last 5 days.  Pt has been taking pantoprazole and maalox.  She was prescribed sucralfate, but has not yet picked this up from her pharmacy.   Plan: 1. Encouraged pt to start sucralfate qid wchs 2. START taking famotidine 20 mg bid.  INCREASE pantoprazole to 20 mg bid 30 minutes before meals. 3. START promethazine 12.5 mg tablet tid prn nausea with vomiting. 4. Administer phenergan 12.5 mg IM in clinic today.  Pt w/ intractable nausea and vomiting w/ episodes of emesis with clear gastric secretions during rooming. 5. Referral GI for additional evaluation. 6. Followup as needed in next 7-10 days and in 1 month if not seen by GI.   Relevant Medications   famotidine (PEPCID) 20 MG tablet   promethazine (PHENERGAN) 12.5 MG tablet   Other Relevant Orders    Ambulatory referral to Gastroenterology     Other   Chest pain Pt w/ persistent chest pain likely secondary to cough and intracostal muscle strain.  Pt concerned for possible pneumonia.  Plan: 1. DG chest complete and normal.   Relevant Orders   DG Chest 2 View (Completed)   Cough Pt w/ cough 2/2 respiratory infection.    Other Visit Diagnoses    PCO (polycystic ovaries)     Pt requests refill of OCP.  No sexual activity since last menses.  No concern pt is currently pregnant.  Pt states is due for menses again next week.   Relevant Medications   norethindrone-ethinyl estradiol-iron (  LOESTRIN FE 1.5/30) 1.5-30 MG-MCG tablet   Other constipation     Pt has chronic constipation.  Reports intermittent use of metoclopromide w/o significant change in symptoms.  Plan: 1. START colace 100 mg capsule twice daily as needed for constipation. 2. Followup as needed.   Relevant Medications   docusate sodium (COLACE) 100 MG capsule   Acute nasopharyngitis     Acute illness. Fever responsive to NSAIDs and tylenol.  Symptoms not worsening. Consistent with viral illness x 3 days with no known sick contacts and no identifiable focal infections of ears, nose, throat.  Likely increased mucous and cough is contributing to worsening nausea and vomiting.  Plan: 1. Reassurance, likely self-limited with cough lasting up to few weeks - START Flonase 2 sprays each nostril daily for up to 4-6 weeks - Start Mucinex-DM OTC up to 7-10 days then stop 2. Supportive care with nasal saline, warm herbal tea with honey, 3. Improve hydration 4. Tylenol / Motrin PRN fevers 5. Return criteria given    Relevant Medications   fluticasone (FLONASE) 50 MCG/ACT nasal spray      Meds ordered this encounter  Medications  . famotidine (PEPCID) 20 MG tablet    Sig: Take 1 tablet (20 mg total) by mouth 2 (two) times daily.    Dispense:  60 tablet    Refill:  5    Order Specific Question:   Supervising Provider     Answer:   Olin Hauser [[id number]]  . promethazine (PHENERGAN) injection 12.5 mg  . promethazine (PHENERGAN) 12.5 MG tablet    Sig: Take 0.5-1 tablets (6.25-12.5 mg total) by mouth every 6 (six) hours as needed for nausea or vomiting.    Dispense:  30 tablet    Refill:  0    Order Specific Question:   Supervising Provider    Answer:   Olin Hauser [[id number]]  . norethindrone-ethinyl estradiol-iron (LOESTRIN FE 1.5/30) 1.5-30 MG-MCG tablet    Sig: Take 1 tablet by mouth at bedtime for 28 days.    Dispense:  1 Package    Refill:  11    Order Specific Question:   Supervising Provider    Answer:   Olin Hauser [[id number]]  . docusate sodium (COLACE) 100 MG capsule    Sig: Take 1 capsule (100 mg total) by mouth 2 (two) times daily.    Dispense:  10 capsule    Refill:  0    Order Specific Question:   Supervising Provider    Answer:   Olin Hauser [[id number]]  . fluticasone (FLONASE) 50 MCG/ACT nasal spray    Sig: Place 2 sprays into both nostrils daily.    Dispense:  16 g    Refill:  6    Order Specific Question:   Supervising Provider    Answer:   Olin Hauser [[id number]]      Follow up plan: Return 5-7 days if symptoms worsen or fail to improve, for 1 month for gastritis.   Cassell Smiles, DNP, AGPCNP-BC Adult Gerontology Primary Care Nurse Practitioner [facility] 07/02/2017, 11:33 AM

## 2017-07-02 ENCOUNTER — Encounter: Payer: Self-pay | Admitting: Nurse Practitioner

## 2017-07-04 ENCOUNTER — Encounter: Payer: Self-pay | Admitting: Gastroenterology

## 2017-07-04 ENCOUNTER — Ambulatory Visit: Payer: BLUE CROSS/BLUE SHIELD | Admitting: Gastroenterology

## 2017-07-04 ENCOUNTER — Other Ambulatory Visit: Payer: Self-pay

## 2017-07-04 VITALS — BP 123/80 | HR 109 | Temp 98.4°F | Ht 67.0 in | Wt 232.4 lb

## 2017-07-04 DIAGNOSIS — R1114 Bilious vomiting: Secondary | ICD-10-CM

## 2017-07-04 DIAGNOSIS — Z791 Long term (current) use of non-steroidal anti-inflammatories (NSAID): Secondary | ICD-10-CM

## 2017-07-04 MED ORDER — SUCRALFATE 1 G PO TABS: 1.0000 g | ORAL_TABLET | Freq: Four times a day (QID) | ORAL | 1 refills | Status: DC

## 2017-07-04 NOTE — Progress Notes (Signed)
Jonathon Bellows MD, MRCP(U.K) [address]  [place]  [place], [place] [address]  Main: [phone]  Fax: [phone]   Gastroenterology Consultation  Referring Provider:     Mikey College, * Primary Care Physician:  Mikey College, NP Primary Gastroenterologist:  Dr. Jonathon Bellows  Reason for Consultation:     Gastritis        HPI:   Jasmine Mason is a 31 y.o. y/o female referred for consultation & management  by Dr. Merrilyn Puma, Jerrel Ivory, NP.    She has been referred for acute gastritis.  She was recently seen in the ER on 06/26/2017 with intermittent chest pain felt like a sensation of burning.  Had prior episodes of vomiting.  For not on a PPI at that point of time.  Based on the ER note it appears cardiac evaluation was in process but at that point of time the patient received a phone call with a family member of hers was dying and was in hospice discharged urgently.  She actually discharged AMA.Labs on 06/26/2017 showed a hemoglobin of 13.7 and MCV of 88.1.  BMP was normal,.  D-dimer is negative and troponin was negative.   She says that she has been sick , throwing up for almost atleast 6 months, worse last few weeks . Throws up 15 times, when she eats , feels it sits in her throat , chest hurts when she eats. No issues when walking or exerting herself. Also has had abdominal pain since last week , pain is in her epigastrium , continuous, throwing up helps. Worse by laying flat . Eating makes it worse. Pain is worse right after eating . Takes goodie powder- upto 2-3 times a day for the last few months. Taking more this month. Last took some last week . She has been gaining weight . Trying to stop smoking. Denies any illegal drug use or marijuana use.   She was taking Prilosec 2 times a day , stopped a few months back., was taking before her meals. EGD 1 year back was normal.   She takes hydrocodone on and off : has been prescribed it for abdominal pain by the ER.    Past Medical History:  Diagnosis Date  . Back pain   . Back pain   . GERD (gastroesophageal reflux disease)   . Hypertension   . Mesenteric adenitis   . Obesity   . PCOS (polycystic ovarian syndrome)   . Tobacco abuse     Past Surgical History:  Procedure Laterality Date  . CARPAL TUNNEL RELEASE Left ~ 2013  . ESOPHAGOGASTRODUODENOSCOPY N/A 01/25/2015   Procedure: ESOPHAGOGASTRODUODENOSCOPY (EGD);  Surgeon: Irene Shipper, MD;  Location: [facility] ENDOSCOPY;  Service: Endoscopy;  Laterality: N/A;  . ESOPHAGOGASTRODUODENOSCOPY (EGD) WITH PROPOFOL N/A 03/13/2016   Procedure: ESOPHAGOGASTRODUODENOSCOPY (EGD) WITH PROPOFOL;  Surgeon: Lucilla Lame, MD;  Location: [facility] ENDOSCOPY;  Service: Endoscopy;  Laterality: N/A;  . FRACTURE SURGERY    . ORIF ANKLE FRACTURE Left 1990's    Prior to Admission medications   Medication Sig Start Date End Date Taking? Authorizing Provider  Adapalene 0.3 % gel APPLY A THIN LAYER TO FACE QHS AND [place] OFF QAM 05/29/17  Yes [provider]  docusate sodium (COLACE) 100 MG capsule Take 1 capsule (100 mg total) by mouth 2 (two) times daily. 06/27/17  Yes Mikey College, NP  doxycycline (VIBRAMYCIN) 50 MG capsule Take 1 capsule by mouth daily. 05/27/17  Yes [provider]  fluticasone Asencion Islam)  50 MCG/ACT nasal spray Place 2 sprays into both nostrils daily. 06/27/17  Yes Mikey College, NP  hydrochlorothiazide (HYDRODIURIL) 12.5 MG tablet TAKE 1 TABLET(12.5 MG) BY MOUTH DAILY 05/27/17  Yes Mikey College, NP  HYDROcodone-acetaminophen (NORCO/VICODIN) 5-325 MG tablet Take 1-2 tablets by mouth every 6 (six) hours as needed. 09/27/14  Yes [provider]  nicotine (NICODERM CQ - DOSED IN MG/24 HR) 7 mg/24hr patch Place 1 patch (7 mg total) onto the skin daily. 04/12/17  Yes Mikey College, NP  norethindrone-ethinyl estradiol-iron (LOESTRIN FE 1.5/30) 1.5-30 MG-MCG tablet Take 1 tablet by mouth at bedtime for 28 days. 06/27/17  07/25/17 Yes Mikey College, NP  pantoprazole (PROTONIX) 40 MG tablet TAKE 1 TABLET(40 MG) BY MOUTH TWICE DAILY 05/30/17  Yes Mikey College, NP  promethazine (PHENERGAN) 12.5 MG tablet Take 0.5-1 tablets (6.25-12.5 mg total) by mouth every 6 (six) hours as needed for nausea or vomiting. 06/27/17  Yes Mikey College, NP  sucralfate (CARAFATE) 1 g tablet Take 1 tablet (1 g total) by mouth 4 (four) times daily. 06/26/17  Yes Carrie Mew, MD    Family History  Problem Relation Age of Onset  . Hypertension Mother   . Hypertension Father   . Diabetes Father   . Heart disease Father   . Hypertension Sister   . Throat cancer Maternal Grandfather   . Stroke Paternal Grandmother   . Diabetes Paternal Grandmother   . Ovarian cancer Neg Hx   . Colon cancer Neg Hx   . Breast cancer Neg Hx      Social History   Tobacco Use  . Smoking status: Current Every Day Smoker    Packs/day: 0.25    Years: 10.00    Pack years: 2.50    Types: Cigarettes  . Smokeless tobacco: Never Used  Substance Use Topics  . Alcohol use: No    Alcohol/week: 0.0 oz  . Drug use: No    Allergies as of 07/04/2017  . (No Known Allergies)    Review of Systems:    All systems reviewed and negative except where noted in HPI.   Physical Exam:  BP 123/80 (BP Location: Left Arm, Patient Position: Sitting, Cuff Size: Large)   Pulse (!) 109   Temp 98.4 F (36.9 C) (Oral)   Ht 5\' 7"  (1.702 m)   Wt 232 lb 6.4 oz (105.4 kg)   LMP 06/12/2017   BMI 36.40 kg/m  Patient's last menstrual period was 06/12/2017. Psych:  Alert and cooperative. Normal mood and affect. General:   Alert,  Well-developed, well-nourished, pleasant and cooperative in NAD Head:  Normocephalic and atraumatic. Eyes:  Sclera clear, no icterus.   Conjunctiva pink. Ears:  Normal auditory acuity. Nose:  No deformity, discharge, or lesions. Mouth:  No deformity or lesions,oropharynx pink & moist. Neck:  Supple; no masses or  thyromegaly. Lungs:  Respirations even and unlabored.  Clear throughout to auscultation.   No wheezes, crackles, or rhonchi. No acute distress. Heart:  Regular rate and rhythm; no murmurs, clicks, rubs, or gallops. Abdomen:  Normal bowel sounds.  No bruits.  Soft, non-tender and non-distended without masses, hepatosplenomegaly or hernias noted.  No guarding or rebound tenderness.    Msk:  Symmetrical without gross deformities. Good, equal movement & strength bilaterally. Pulses:  Normal pulses noted. Extremities:  No clubbing or edema.  No cyanosis. Neurologic:  Alert and oriented x3;  grossly normal neurologically. Skin:  Intact without significant lesions or rashes. No jaundice.  Lymph Nodes:  No significant cervical adenopathy. Psych:  Alert and cooperative. Normal mood and affect.  Imaging Studies: Dg Chest 2 View  Result Date: 06/27/2017 CLINICAL DATA:  Chest pain, cough, fever EXAM: CHEST  2 VIEW COMPARISON:  Portable chest x-ray of 09/20/2016 FINDINGS: No active infiltrate or effusion is seen. Mediastinal and hilar contours are unremarkable. The heart is within normal limits in size. No bony abnormality is seen IMPRESSION: No active cardiopulmonary disease. Electronically Signed   By: Ivar Drape M.D.   On: 06/27/2017 11:04    Assessment and Plan:   Addley Ballinger is a 31 y.o. y/o female has been referred for persistent nausea and vomiting. She has been on long term NSAID's which could cause a Peptic ulcer. She could also hve gastroparesis.    Plan  1. Pregnancy test followed by urine drug screen  2. EGD tomorrow  3. Stop smoking  4. Commence on Protonix 5. Carafate QID 6. Gastric emptying study .  7. GERD: Counseled on life style changes, suggest to use PPI first thing in the morning on empty stomach and eat 30 minutes after. Advised on the use of a wedge pillow at night , avoid meals for 2 hours prior to bed time. Weight loss .   I have discussed alternative options, risks &  benefits,  which include, but are not limited to, bleeding, infection, perforation,respiratory complication & drug reaction.  The patient agrees with this plan & written consent will be obtained.    Follow up in 6 weeks  Dr Jonathon Bellows MD,MRCP(U.K)

## 2017-07-04 NOTE — Addendum Note (Signed)
Addended by: Peggye Ley on: 07/04/2017 02:46 PM   Modules accepted: Orders, SmartSet

## 2017-07-05 ENCOUNTER — Encounter: Payer: Self-pay | Admitting: *Deleted

## 2017-07-05 ENCOUNTER — Ambulatory Visit: Payer: BLUE CROSS/BLUE SHIELD | Admitting: Anesthesiology

## 2017-07-05 ENCOUNTER — Encounter
Admission: RE | Disposition: A | Discharge status: Home / Self Care | Payer: Self-pay | Source: Ambulatory Visit | Attending: Gastroenterology

## 2017-07-05 ENCOUNTER — Ambulatory Visit
Admission: RE | Admit: 2017-07-05 | Discharge: 2017-07-05 | Disposition: A | Discharge status: Home / Self Care | Payer: BLUE CROSS/BLUE SHIELD | Source: Ambulatory Visit | Attending: Gastroenterology | Admitting: Gastroenterology

## 2017-07-05 DIAGNOSIS — E669 Obesity, unspecified: Secondary | ICD-10-CM | POA: Insufficient documentation

## 2017-07-05 DIAGNOSIS — K219 Gastro-esophageal reflux disease without esophagitis: Secondary | ICD-10-CM | POA: Diagnosis not present

## 2017-07-05 DIAGNOSIS — K449 Diaphragmatic hernia without obstruction or gangrene: Secondary | ICD-10-CM | POA: Diagnosis not present

## 2017-07-05 DIAGNOSIS — R112 Nausea with vomiting, unspecified: Secondary | ICD-10-CM | POA: Insufficient documentation

## 2017-07-05 DIAGNOSIS — Z6836 Body mass index (BMI) 36.0-36.9, adult: Secondary | ICD-10-CM | POA: Insufficient documentation

## 2017-07-05 DIAGNOSIS — R1114 Bilious vomiting: Secondary | ICD-10-CM | POA: Diagnosis not present

## 2017-07-05 DIAGNOSIS — Z791 Long term (current) use of non-steroidal anti-inflammatories (NSAID): Secondary | ICD-10-CM | POA: Diagnosis not present

## 2017-07-05 DIAGNOSIS — E282 Polycystic ovarian syndrome: Secondary | ICD-10-CM | POA: Insufficient documentation

## 2017-07-05 DIAGNOSIS — F17210 Nicotine dependence, cigarettes, uncomplicated: Secondary | ICD-10-CM | POA: Insufficient documentation

## 2017-07-05 DIAGNOSIS — Z79899 Other long term (current) drug therapy: Secondary | ICD-10-CM | POA: Diagnosis not present

## 2017-07-05 DIAGNOSIS — I10 Essential (primary) hypertension: Secondary | ICD-10-CM | POA: Insufficient documentation

## 2017-07-05 HISTORY — PX: ESOPHAGOGASTRODUODENOSCOPY (EGD) WITH PROPOFOL: SHX5813

## 2017-07-05 LAB — POCT PREGNANCY, URINE: Preg Test, Ur: NEGATIVE

## 2017-07-05 SURGERY — ESOPHAGOGASTRODUODENOSCOPY (EGD) WITH PROPOFOL
Anesthesia: General

## 2017-07-05 MED ORDER — PHENYLEPHRINE HCL 10 MG/ML IJ SOLN
INTRAMUSCULAR | Status: AC
  Filled 2017-07-05: qty 1

## 2017-07-05 MED ORDER — IPRATROPIUM-ALBUTEROL 0.5-2.5 (3) MG/3ML IN SOLN
RESPIRATORY_TRACT | Status: AC
  Administered 2017-07-05: 3 mL
  Filled 2017-07-05: qty 3

## 2017-07-05 MED ORDER — PROPOFOL 500 MG/50ML IV EMUL
INTRAVENOUS | Status: DC | PRN
Start: 2017-07-05 — End: 2017-07-05
  Administered 2017-07-05: 170 ug/kg/min via INTRAVENOUS

## 2017-07-05 MED ORDER — FENTANYL CITRATE (PF) 100 MCG/2ML IJ SOLN
INTRAMUSCULAR | Status: AC
  Filled 2017-07-05: qty 2

## 2017-07-05 MED ORDER — GLYCOPYRROLATE 0.2 MG/ML IJ SOLN
INTRAMUSCULAR | Status: AC
  Filled 2017-07-05: qty 2

## 2017-07-05 MED ORDER — LIDOCAINE HCL (CARDIAC) 20 MG/ML IV SOLN
INTRAVENOUS | Status: DC | PRN
  Administered 2017-07-05: 100 mg via INTRAVENOUS

## 2017-07-05 MED ORDER — SUCCINYLCHOLINE CHLORIDE 20 MG/ML IJ SOLN
INTRAMUSCULAR | Status: AC
  Filled 2017-07-05: qty 1

## 2017-07-05 MED ORDER — LIDOCAINE HCL (PF) 2 % IJ SOLN
INTRAMUSCULAR | Status: AC
  Filled 2017-07-05: qty 10

## 2017-07-05 MED ORDER — PROPOFOL 10 MG/ML IV BOLUS
INTRAVENOUS | Status: DC | PRN
  Administered 2017-07-05: 60 mg via INTRAVENOUS
  Administered 2017-07-05: 20 mg via INTRAVENOUS

## 2017-07-05 MED ORDER — PROPOFOL 500 MG/50ML IV EMUL
INTRAVENOUS | Status: AC
  Filled 2017-07-05: qty 50

## 2017-07-05 MED ORDER — SODIUM CHLORIDE 0.9 % IV SOLN
INTRAVENOUS | Status: DC
  Administered 2017-07-05: 1000 mL via INTRAVENOUS

## 2017-07-05 MED ORDER — GLYCOPYRROLATE 0.2 MG/ML IJ SOLN
INTRAMUSCULAR | Status: DC | PRN
  Administered 2017-07-05: 0.2 mg via INTRAVENOUS

## 2017-07-05 MED ORDER — FENTANYL CITRATE (PF) 100 MCG/2ML IJ SOLN
INTRAMUSCULAR | Status: DC | PRN
  Administered 2017-07-05: 50 ug via INTRAVENOUS

## 2017-07-05 NOTE — Anesthesia Post-op Follow-up Note (Signed)
Anesthesia QCDR form completed.        

## 2017-07-05 NOTE — Anesthesia Postprocedure Evaluation (Signed)
Anesthesia Post Note  Patient: Jasmine Mason  Procedure(s) Performed: ESOPHAGOGASTRODUODENOSCOPY (EGD) WITH PROPOFOL (N/A )  Patient location during evaluation: Endoscopy Anesthesia Type: General Level of consciousness: awake and alert and oriented Pain management: pain level controlled Vital Signs Assessment: post-procedure vital signs reviewed and stable Respiratory status: spontaneous breathing, nonlabored ventilation and respiratory function stable Cardiovascular status: blood pressure returned to baseline and stable Postop Assessment: no signs of nausea or vomiting Anesthetic complications: no     Last Vitals:  Vitals:   07/05/17 0800 07/05/17 0810  BP: 128/86 128/86  Pulse: (!) 106 (!) 102  Resp: (!) 25 11  Temp:    SpO2: 100% 100%    Last Pain:  Vitals:   07/05/17 0750  TempSrc: Tympanic                 Gauri Galvao

## 2017-07-05 NOTE — H&P (Signed)
Jonathon Bellows, MD [address], [place], [place], [place], [address] [address], [place], [place], [place], [address] Phone: [phone]  Fax: [phone]  Primary Care Physician:  Mikey College, NP   Pre-Procedure History & Physical: HPI:  Jasmine Mason is a 31 y.o. female is here for an endoscopy    Past Medical History:  Diagnosis Date  . Back pain   . Back pain   . GERD (gastroesophageal reflux disease)   . Hypertension   . Mesenteric adenitis   . Obesity   . PCOS (polycystic ovarian syndrome)   . Tobacco abuse     Past Surgical History:  Procedure Laterality Date  . CARPAL TUNNEL RELEASE Left ~ 2013  . ESOPHAGOGASTRODUODENOSCOPY N/A 01/25/2015   Procedure: ESOPHAGOGASTRODUODENOSCOPY (EGD);  Surgeon: Irene Shipper, MD;  Location: [facility] ENDOSCOPY;  Service: Endoscopy;  Laterality: N/A;  . ESOPHAGOGASTRODUODENOSCOPY (EGD) WITH PROPOFOL N/A 03/13/2016   Procedure: ESOPHAGOGASTRODUODENOSCOPY (EGD) WITH PROPOFOL;  Surgeon: Lucilla Lame, MD;  Location: [facility] ENDOSCOPY;  Service: Endoscopy;  Laterality: N/A;  . FRACTURE SURGERY    . ORIF ANKLE FRACTURE Left 1990's    Prior to Admission medications   Medication Sig Start Date End Date Taking? Authorizing Provider  Adapalene 0.3 % gel APPLY A THIN LAYER TO FACE QHS AND [place] OFF QAM 05/29/17   [provider]  docusate sodium (COLACE) 100 MG capsule Take 1 capsule (100 mg total) by mouth 2 (two) times daily. 06/27/17   Mikey College, NP  doxycycline (VIBRAMYCIN) 50 MG capsule Take 1 capsule by mouth daily. 05/27/17   [provider]  fluticasone (FLONASE) 50 MCG/ACT nasal spray Place 2 sprays into both nostrils daily. 06/27/17   Mikey College, NP  hydrochlorothiazide (HYDRODIURIL) 12.5 MG tablet TAKE 1 TABLET(12.5 MG) BY MOUTH DAILY 05/27/17   Mikey College, NP  HYDROcodone-acetaminophen (NORCO/VICODIN) 5-325 MG tablet Take 1-2 tablets by mouth every 6 (six) hours as needed. 09/27/14    [provider]  nicotine (NICODERM CQ - DOSED IN MG/24 HR) 7 mg/24hr patch Place 1 patch (7 mg total) onto the skin daily. 04/12/17   Mikey College, NP  norethindrone-ethinyl estradiol-iron (LOESTRIN FE 1.5/30) 1.5-30 MG-MCG tablet Take 1 tablet by mouth at bedtime for 28 days. 06/27/17 07/25/17  Mikey College, NP  pantoprazole (PROTONIX) 40 MG tablet TAKE 1 TABLET(40 MG) BY MOUTH TWICE DAILY 05/30/17   Mikey College, NP  promethazine (PHENERGAN) 12.5 MG tablet Take 0.5-1 tablets (6.25-12.5 mg total) by mouth every 6 (six) hours as needed for nausea or vomiting. 06/27/17   Mikey College, NP  sucralfate (CARAFATE) 1 g tablet Take 1 tablet (1 g total) by mouth 4 (four) times daily. 07/04/17   Jonathon Bellows, MD    Allergies as of 07/04/2017  . (No Known Allergies)    Family History  Problem Relation Age of Onset  . Hypertension Mother   . Hypertension Father   . Diabetes Father   . Heart disease Father   . Hypertension Sister   . Throat cancer Maternal Grandfather   . Stroke Paternal Grandmother   . Diabetes Paternal Grandmother   . Ovarian cancer Neg Hx   . Colon cancer Neg Hx   . Breast cancer Neg Hx     Social History   Socioeconomic History  . Marital status: Single    Spouse name: Not on file  . Number of children: Not on file  . Years of education: Not  on file  . Highest education level: Not on file  Social Needs  . Financial resource strain: Not on file  . Food insecurity - worry: Not on file  . Food insecurity - inability: Not on file  . Transportation needs - medical: Not on file  . Transportation needs - non-medical: Not on file  Occupational History  . Not on file  Tobacco Use  . Smoking status: Current Every Day Smoker    Packs/day: 0.25    Years: 10.00    Pack years: 2.50    Types: Cigarettes  . Smokeless tobacco: Never Used  Substance and Sexual Activity  . Alcohol use: No    Alcohol/week: 0.0 oz  . Drug use: No  .  Sexual activity: Yes    Birth control/protection: Condom, Pill  Other Topics Concern  . Not on file  Social History Narrative  . Not on file    Review of Systems: See HPI, otherwise negative ROS  Physical Exam: BP 133/80   Pulse 89   Temp 98.4 F (36.9 C) (Tympanic)   Resp 20   Ht 5\' 7"  (1.702 m)   Wt 232 lb (105.2 kg)   LMP 06/12/2017   SpO2 99%   BMI 36.34 kg/m  General:   Alert,  pleasant and cooperative in NAD Head:  Normocephalic and atraumatic. Neck:  Supple; no masses or thyromegaly. Lungs:  Clear throughout to auscultation, normal respiratory effort.    Heart:  +S1, +S2, Regular rate and rhythm, No edema. Abdomen:  Soft, nontender and nondistended. Normal bowel sounds, without guarding, and without rebound.   Neurologic:  Alert and  oriented x4;  grossly normal neurologically.  Impression/Plan: Jasmine Mason is here for an endoscopy  to be performed for  evaluation of nausea and vomiting     Risks, benefits, limitations, and alternatives regarding endoscopy have been reviewed with the patient.  Questions have been answered.  All parties agreeable.   Jonathon Bellows, MD  07/05/2017, 7:36 AM

## 2017-07-05 NOTE — Anesthesia Preprocedure Evaluation (Signed)
Anesthesia Evaluation  Patient identified by MRN, date of birth, ID band Patient awake    Reviewed: Allergy & Precautions, NPO status , Patient's Chart, lab work & pertinent test results  History of Anesthesia Complications Negative for: history of anesthetic complications  Airway Mallampati: IV  TM Distance: >3 FB Neck ROM: Full    Dental no notable dental hx.    Pulmonary neg COPD, Current Smoker,    breath sounds clear to auscultation- rhonchi (-) wheezing      Cardiovascular hypertension, Pt. on medications (-) CAD, (-) Past MI and (-) Cardiac Stents  Rhythm:Regular Rate:Normal - Systolic murmurs and - Diastolic murmurs    Neuro/Psych negative neurological ROS  negative psych ROS   GI/Hepatic Neg liver ROS, hiatal hernia, GERD  ,  Endo/Other  negative endocrine ROSneg diabetes  Renal/GU negative Renal ROS     Musculoskeletal negative musculoskeletal ROS (+)   Abdominal (+) + obese,   Peds  Hematology negative hematology ROS (+)   Anesthesia Other Findings Past Medical History: No date: Back pain No date: Back pain No date: GERD (gastroesophageal reflux disease) No date: Hypertension No date: Mesenteric adenitis No date: Obesity No date: PCOS (polycystic ovarian syndrome) No date: Tobacco abuse   Reproductive/Obstetrics                             Anesthesia Physical Anesthesia Plan  ASA: II  Anesthesia Plan: General   Post-op Pain Management:    Induction: Intravenous  PONV Risk Score and Plan: 2 and Propofol infusion  Airway Management Planned: Natural Airway  Additional Equipment:   Intra-op Plan:   Post-operative Plan:   Informed Consent: I have reviewed the patients History and Physical, chart, labs and discussed the procedure including the risks, benefits and alternatives for the proposed anesthesia with the patient or authorized representative who has  indicated his/her understanding and acceptance.   Dental advisory given  Plan Discussed with: CRNA and Anesthesiologist  Anesthesia Plan Comments:         Anesthesia Quick Evaluation

## 2017-07-05 NOTE — Op Note (Signed)
[facility] Gastroenterology Patient Name: Jasmine Mason Procedure Date: 07/05/2017 7:39 AM MRN: [id number] Account #: [id number] Date of Birth: [birth year] Admit Type: Outpatient Age: 31 Room: [facility] ENDO ROOM 4 Gender: Female Note Status: Finalized Procedure:            Upper GI endoscopy Indications:          Nausea with vomiting Providers:            Jonathon Bellows MD, MD Medicines:            Monitored Anesthesia Care Complications:        No immediate complications. Procedure:            Pre-Anesthesia Assessment:                       - Prior to the procedure, a History and Physical was                        performed, and patient medications, allergies and                        sensitivities were reviewed. The patient's tolerance of                        previous anesthesia was reviewed.                       - The risks and benefits of the procedure and the                        sedation options and risks were discussed with the                        patient. All questions were answered and informed                        consent was obtained.                       - ASA Grade Assessment: III - A patient with severe                        systemic disease.                       After obtaining informed consent, the endoscope was                        passed under direct vision. Throughout the procedure,                        the patient's blood pressure, pulse, and oxygen                        saturations were monitored continuously. The                        Colonoscope was introduced through the mouth, and                        advanced to the third part of duodenum. The upper GI  endoscopy was accomplished with ease. The patient                        tolerated the procedure well. Findings:      The examined duodenum was normal.      The esophagus was normal.      A 6 cm hiatal hernia was present.      The entire examined  stomach was normal. Biopsies were taken with a cold       forceps for histology.      The cardia and gastric fundus were normal on retroflexion. Impression:           - Normal examined duodenum.                       - Normal esophagus.                       - 6 cm hiatal hernia.                       - Normal stomach. Biopsied. Recommendation:       - Await pathology results.                       - Discharge patient to home (with escort).                       - Resume previous diet.                       - Continue present medications.                       - Return to my office in 6 weeks. Procedure Code(s):    --- Professional ---                       [phone], Esophagogastroduodenoscopy, flexible, transoral;                        with biopsy, single or multiple Diagnosis Code(s):    --- Professional ---                       K44.9, Diaphragmatic hernia without obstruction or                        gangrene                       R11.2, Nausea with vomiting, unspecified CPT copyright 2016 [employer]. All rights reserved. The codes documented in this report are preliminary and upon coder review may  be revised to meet current compliance requirements. Jonathon Bellows, MD Jonathon Bellows MD, MD 07/05/2017 7:54:28 AM This report has been signed electronically. Number of Addenda: 0 Note Initiated On: 07/05/2017 7:39 AM      [facility]

## 2017-07-05 NOTE — Transfer of Care (Signed)
Immediate Anesthesia Transfer of Care Note  Patient: Jasmine Mason  Procedure(s) Performed: ESOPHAGOGASTRODUODENOSCOPY (EGD) WITH PROPOFOL (N/A )  Patient Location: PACU  Anesthesia Type:General  Level of Consciousness: awake, alert  and oriented  Airway & Oxygen Therapy: Patient Spontanous Breathing and Patient connected to nasal cannula oxygen  Post-op Assessment: Report given to RN and Post -op Vital signs reviewed and stable  Post vital signs: Reviewed and stable  Last Vitals:  Vitals:   07/05/17 0710  BP: 133/80  Pulse: 89  Resp: 20  Temp: 36.9 C  SpO2: 99%    Last Pain:  Vitals:   07/05/17 0710  TempSrc: Tympanic         Complications: No apparent anesthesia complications

## 2017-07-08 LAB — SURGICAL PATHOLOGY

## 2017-07-09 ENCOUNTER — Encounter: Payer: Self-pay | Admitting: Gastroenterology

## 2017-07-16 ENCOUNTER — Encounter: Payer: Self-pay | Admitting: Gastroenterology

## 2017-07-24 ENCOUNTER — Other Ambulatory Visit: Payer: Self-pay

## 2017-07-24 ENCOUNTER — Emergency Department: Payer: BLUE CROSS/BLUE SHIELD

## 2017-07-24 ENCOUNTER — Encounter: Payer: Self-pay | Admitting: Emergency Medicine

## 2017-07-24 ENCOUNTER — Emergency Department
Admission: EM | Admit: 2017-07-24 | Discharge: 2017-07-24 | Disposition: A | Discharge status: Home / Self Care | Payer: BLUE CROSS/BLUE SHIELD | Attending: Emergency Medicine | Admitting: Emergency Medicine

## 2017-07-24 DIAGNOSIS — F17210 Nicotine dependence, cigarettes, uncomplicated: Secondary | ICD-10-CM | POA: Diagnosis not present

## 2017-07-24 DIAGNOSIS — R1013 Epigastric pain: Secondary | ICD-10-CM | POA: Diagnosis not present

## 2017-07-24 DIAGNOSIS — R112 Nausea with vomiting, unspecified: Secondary | ICD-10-CM | POA: Diagnosis not present

## 2017-07-24 DIAGNOSIS — I10 Essential (primary) hypertension: Secondary | ICD-10-CM | POA: Insufficient documentation

## 2017-07-24 DIAGNOSIS — R109 Unspecified abdominal pain: Secondary | ICD-10-CM | POA: Diagnosis present

## 2017-07-24 DIAGNOSIS — R079 Chest pain, unspecified: Secondary | ICD-10-CM | POA: Diagnosis not present

## 2017-07-24 DIAGNOSIS — Z79899 Other long term (current) drug therapy: Secondary | ICD-10-CM | POA: Insufficient documentation

## 2017-07-24 LAB — URINALYSIS, COMPLETE (UACMP) WITH MICROSCOPIC
BILIRUBIN URINE: NEGATIVE
GLUCOSE, UA: NEGATIVE mg/dL
HGB URINE DIPSTICK: NEGATIVE
Ketones, ur: 20 mg/dL — AB
LEUKOCYTES UA: NEGATIVE
NITRITE: NEGATIVE
PH: 5 (ref 5.0–8.0)
Protein, ur: 100 mg/dL — AB
SPECIFIC GRAVITY, URINE: 1.034 — AB (ref 1.005–1.030)
WBC, UA: NONE SEEN WBC/hpf (ref 0–5)

## 2017-07-24 LAB — CBC
HEMATOCRIT: 44.3 % (ref 35.0–47.0)
Hemoglobin: 15.1 g/dL (ref 12.0–16.0)
MCH: 29.9 pg (ref 26.0–34.0)
MCHC: 34 g/dL (ref 32.0–36.0)
MCV: 87.8 fL (ref 80.0–100.0)
Platelets: 334 10*3/uL (ref 150–440)
RBC: 5.04 MIL/uL (ref 3.80–5.20)
RDW: 13.8 % (ref 11.5–14.5)
WBC: 16 10*3/uL — AB (ref 3.6–11.0)

## 2017-07-24 LAB — HEPATIC FUNCTION PANEL
ALK PHOS: 85 U/L (ref 38–126)
ALT: 21 U/L (ref 14–54)
AST: 25 U/L (ref 15–41)
Albumin: 4.5 g/dL (ref 3.5–5.0)
Bilirubin, Direct: <0.1 mg/dL — ABNORMAL LOW (ref 0.1–0.5)
Total Bilirubin: 1 mg/dL (ref 0.3–1.2)
Total Protein: 8.1 g/dL (ref 6.5–8.1)

## 2017-07-24 LAB — BASIC METABOLIC PANEL
Anion gap: 13 (ref 5–15)
BUN: 12 mg/dL (ref 6–20)
CHLORIDE: 105 mmol/L (ref 101–111)
CO2: 21 mmol/L — AB (ref 22–32)
Calcium: 9.3 mg/dL (ref 8.9–10.3)
Creatinine, Ser: 0.63 mg/dL (ref 0.44–1.00)
GFR calc non Af Amer: >60 mL/min (ref >60)
Glucose, Bld: 145 mg/dL — ABNORMAL HIGH (ref 65–99)
POTASSIUM: 3.4 mmol/L — AB (ref 3.5–5.1)
SODIUM: 139 mmol/L (ref 135–145)

## 2017-07-24 LAB — LIPASE, BLOOD: Lipase: 43 U/L (ref 11–51)

## 2017-07-24 LAB — URINE DRUG SCREEN, QUALITATIVE (ARMC ONLY)
Amphetamines, Ur Screen: NOT DETECTED
BARBITURATES, UR SCREEN: NOT DETECTED
Benzodiazepine, Ur Scrn: NOT DETECTED
COCAINE METABOLITE, UR ~~LOC~~: NOT DETECTED
Cannabinoid 50 Ng, Ur ~~LOC~~: POSITIVE — AB
MDMA (Ecstasy)Ur Screen: NOT DETECTED
METHADONE SCREEN, URINE: NOT DETECTED
OPIATE, UR SCREEN: NOT DETECTED
Phencyclidine (PCP) Ur S: NOT DETECTED
Tricyclic, Ur Screen: NOT DETECTED

## 2017-07-24 LAB — TROPONIN I

## 2017-07-24 LAB — GLUCOSE, CAPILLARY: Glucose-Capillary: 111 mg/dL — ABNORMAL HIGH (ref 65–99)

## 2017-07-24 LAB — PREGNANCY, URINE: Preg Test, Ur: NEGATIVE

## 2017-07-24 MED ORDER — ONDANSETRON 4 MG PO TBDP
4.0000 mg | ORAL_TABLET | Freq: Once | ORAL | Status: AC
  Administered 2017-07-24: 4 mg via ORAL
  Filled 2017-07-24: qty 1

## 2017-07-24 MED ORDER — HALOPERIDOL LACTATE 5 MG/ML IJ SOLN
2.0000 mg | Freq: Once | INTRAMUSCULAR | Status: AC
  Administered 2017-07-24: 2 mg via INTRAVENOUS
  Filled 2017-07-24: qty 0.4
  Filled 2017-07-24: qty 1

## 2017-07-24 MED ORDER — ONDANSETRON HCL 4 MG PO TABS: 4.0000 mg | ORAL_TABLET | Freq: Three times a day (TID) | ORAL | 0 refills | Status: DC | PRN

## 2017-07-24 MED ORDER — SODIUM CHLORIDE 0.9 % IV BOLUS (SEPSIS)
1000.0000 mL | Freq: Once | INTRAVENOUS | Status: AC
  Administered 2017-07-24: 1000 mL via INTRAVENOUS

## 2017-07-24 NOTE — ED Notes (Signed)
MD at bedside. 

## 2017-07-24 NOTE — ED Notes (Addendum)
This RN into room to inquire about urine collection. Pt is sound asleep. When asked if she thought she could urinate, pt replied "more blankets". I asked pt again about urine. MD stated to offer In and out cath. Pt declined and then asked for a new hospital and then a new doctor. Explained to pt we needed to test urine to find cause of pain. Pt agreed to in and out cath. Pt reports pain to belly and then goes back to sleep.

## 2017-07-24 NOTE — ED Notes (Signed)
Pt resting in bed. No emesis at this time. When asked about pain she stated that her abd hurts. No acute distress noted. Urine sample requested

## 2017-07-24 NOTE — ED Triage Notes (Signed)
Pt with N/V, chest pain and abd pain since 0300 today. Chest pain started first. Denies cardiac hx.

## 2017-07-24 NOTE — ED Notes (Signed)
Patient cursing at staff member and refusing to sign d/c paperwork "I aint signing shit since you all didn't do anything for me" patient given rx for zofran. refused vitals but IV was taken out by nurse.

## 2017-07-24 NOTE — ED Provider Notes (Addendum)
[facility] Emergency Department Provider Note  ____________________________________________   I have reviewed the triage vital signs and the nursing notes. Where available I have reviewed prior notes and, if possible and indicated, outside hospital notes.    HISTORY  Chief Complaint Chest Pain; Emesis; and Abdominal Pain    HPI Jasmine Mason is a 32 y.o. female with a history of chronic abdominal pain with multiple visits here in other facilities for same over the last several years, states that she is having a "flare" of her chronic abdominal pain.  She denies any fever chills, when I enter the room she is in a deep and peaceful slumber but she wakes up and states that she is nauseated and has a tummy ache in her epigastric region.  Nonradiating discomfort.  No fever no chills.  Exactly the same as multiple prior visits.  No diarrhea.  Normal bowel movements she states.  Has had some vomiting.  Has had extensive workup for this including multiple CT scans and EGD recent CT scan for her chronic abdominal pain and vomiting was in September at this facility which was normal she also has had negative CT scans in 2016 and 2015 for chronic abdominal pain and vomiting.    Past Medical History:  Diagnosis Date  . Back pain   . Back pain   . GERD (gastroesophageal reflux disease)   . Hypertension   . Mesenteric adenitis   . Obesity   . PCOS (polycystic ovarian syndrome)   . Tobacco abuse     Patient Active Problem List   Diagnosis Date Noted  . Encounter for smoking cessation counseling 04/18/2017  . Essential hypertension 04/18/2017  . Epigastric abdominal pain   . Acute gastritis 09/20/2016  . Nausea with vomiting   . Hiatal hernia   . Uncontrollable vomiting 03/10/2016  . Abdominal pain, epigastric   . GERD (gastroesophageal reflux disease) 01/24/2015  . Hypokalemia 01/24/2015  . Abdominal pain 01/24/2015  . Leukocytosis 01/24/2015  . Chest pain  01/24/2015  . Cough 01/24/2015  . Pain of left calf 01/24/2015  . Elevated troponin 01/24/2015  . Obesity   . Mesenteric adenitis   . Tobacco abuse     Past Surgical History:  Procedure Laterality Date  . CARPAL TUNNEL RELEASE Left ~ 2013  . ESOPHAGOGASTRODUODENOSCOPY N/A 01/25/2015   Procedure: ESOPHAGOGASTRODUODENOSCOPY (EGD);  Surgeon: Irene Shipper, MD;  Location: [facility] ENDOSCOPY;  Service: Endoscopy;  Laterality: N/A;  . ESOPHAGOGASTRODUODENOSCOPY (EGD) WITH PROPOFOL N/A 03/13/2016   Procedure: ESOPHAGOGASTRODUODENOSCOPY (EGD) WITH PROPOFOL;  Surgeon: Lucilla Lame, MD;  Location: [facility] ENDOSCOPY;  Service: Endoscopy;  Laterality: N/A;  . ESOPHAGOGASTRODUODENOSCOPY (EGD) WITH PROPOFOL N/A 07/05/2017   Procedure: ESOPHAGOGASTRODUODENOSCOPY (EGD) WITH PROPOFOL;  Surgeon: Jonathon Bellows, MD;  Location: [facility] ENDOSCOPY;  Service: Gastroenterology;  Laterality: N/A;  . FRACTURE SURGERY    . ORIF ANKLE FRACTURE Left 1990's    Prior to Admission medications   Medication Sig Start Date End Date Taking? Authorizing Provider  Adapalene 0.3 % gel APPLY A THIN LAYER TO FACE Q[facility] AND [place] OFF QAM 05/29/17   [provider]  docusate sodium (COLACE) 100 MG capsule Take 1 capsule (100 mg total) by mouth 2 (two) times daily. 06/27/17   Mikey College, NP  doxycycline (VIBRAMYCIN) 50 MG capsule Take 1 capsule by mouth daily. 05/27/17   [provider]  fluticasone (FLONASE) 50 MCG/ACT nasal spray Place 2 sprays into both nostrils daily. 06/27/17   Mikey College, NP  hydrochlorothiazide (HYDRODIURIL) 12.5 MG tablet TAKE 1 TABLET(12.5 MG) BY MOUTH DAILY 05/27/17   Mikey College, NP  HYDROcodone-acetaminophen (NORCO/VICODIN) 5-325 MG tablet Take 1-2 tablets by mouth every 6 (six) hours as needed. 09/27/14   [provider]  nicotine (NICODERM CQ - DOSED IN MG/24 HR) 7 mg/24hr patch Place 1 patch (7 mg total) onto the skin daily. 04/12/17   Mikey College, NP   norethindrone-ethinyl estradiol-iron (LOESTRIN FE 1.5/30) 1.5-30 MG-MCG tablet Take 1 tablet by mouth at bedtime for 28 days. 06/27/17 07/25/17  Mikey College, NP  pantoprazole (PROTONIX) 40 MG tablet TAKE 1 TABLET(40 MG) BY MOUTH TWICE DAILY 05/30/17   Mikey College, NP  promethazine (PHENERGAN) 12.5 MG tablet Take 0.5-1 tablets (6.25-12.5 mg total) by mouth every 6 (six) hours as needed for nausea or vomiting. 06/27/17   Mikey College, NP  sucralfate (CARAFATE) 1 g tablet Take 1 tablet (1 g total) by mouth 4 (four) times daily. 07/04/17   Jonathon Bellows, MD    Allergies Patient has no known allergies.  Family History  Problem Relation Age of Onset  . Hypertension Mother   . Hypertension Father   . Diabetes Father   . Heart disease Father   . Hypertension Sister   . Throat cancer Maternal Grandfather   . Stroke Paternal Grandmother   . Diabetes Paternal Grandmother   . Ovarian cancer Neg Hx   . Colon cancer Neg Hx   . Breast cancer Neg Hx     Social History Social History   Tobacco Use  . Smoking status: Current Every Day Smoker    Packs/day: 0.25    Years: 10.00    Pack years: 2.50    Types: Cigarettes  . Smokeless tobacco: Never Used  Substance Use Topics  . Alcohol use: No    Alcohol/week: 0.0 oz  . Drug use: No    Review of Systems Constitutional: No fever/chills Eyes: No visual changes. ENT: No sore throat. No stiff neck no neck pain Cardiovascular: Denies chest pain. Respiratory: Denies shortness of breath. Gastrointestinal:   + vomiting.  No diarrhea.  No constipation. Genitourinary: Negative for dysuria. Musculoskeletal: Negative lower extremity swelling Skin: Negative for rash. Neurological: Negative for severe headaches, focal weakness or numbness.   ____________________________________________   PHYSICAL EXAM:  VITAL SIGNS: ED Triage Vitals  Enc Vitals Group     BP 07/24/17 1418 (!) 162/99     Pulse Rate 07/24/17 1418 75      Resp --      Temp 07/24/17 1418 99.2 F (37.3 C)     Temp Source 07/24/17 1418 Oral     SpO2 07/24/17 1418 98 %     Weight 07/24/17 1418 232 lb (105.2 kg)     Height 07/24/17 1418 5\' 7"  (1.702 m)     Head Circumference --      Peak Flow --      Pain Score 07/24/17 1415 8     Pain Loc --      Pain Edu? --      Excl. in [place]? --     Constitutional: Alert and oriented. Well appearing and in no acute distress.  The deep infeasible slumber when I enter the room does not notice them in there to light touch or when she wakes up she states she has abdominal pain and nausea.  She does not actually vomit but she spits up a little bit.  She is requesting p.o. Eyes: Conjunctivae are  normal Head: Atraumatic HEENT: No congestion/rhinnorhea. Mucous membranes are moist.  Oropharynx non-erythematous Neck:   Nontender with no meningismus, no masses, no stridor Cardiovascular: Normal rate, regular rhythm. Grossly normal heart sounds.  Good peripheral circulation. Respiratory: Normal respiratory effort.  No retractions. Lungs CTAB. Abdominal: Soft and will epigastric discomfort. No distention. No guarding no rebound Back:  There is no focal tenderness or step off.  there is no midline tenderness there are no lesions noted. there is no CVA tenderness Musculoskeletal: No lower extremity tenderness, no upper extremity tenderness. No joint effusions, no DVT signs strong distal pulses no edema Neurologic:  Normal speech and language. No gross focal neurologic deficits are appreciated.  Skin:  Skin is warm, dry and intact. No rash noted. Psychiatric: Mood and affect are normal. Speech and behavior are normal.  ____________________________________________   LABS (all labs ordered are listed, but only abnormal results are displayed)  Labs Reviewed  BASIC METABOLIC PANEL - Abnormal; Notable for the following components:      Result Value   Potassium 3.4 (*)    CO2 21 (*)    Glucose, Bld 145 (*)    All other  components within normal limits  CBC - Abnormal; Notable for the following components:   WBC 16.0 (*)    All other components within normal limits  GLUCOSE, CAPILLARY - Abnormal; Notable for the following components:   Glucose-Capillary 111 (*)    All other components within normal limits  TROPONIN I  URINALYSIS, COMPLETE (UACMP) WITH MICROSCOPIC  URINE DRUG SCREEN, QUALITATIVE ([facility] ONLY)  LIPASE, BLOOD  HEPATIC FUNCTION PANEL  CBG MONITORING, ED    Pertinent labs  results that were available during my care of the patient were reviewed by me and considered in my medical decision making (see chart for details). ____________________________________________  EKG  I personally interpreted any EKGs ordered by me or triage Sinus rhythm rate 61 bpm no acute ST elevation or depression ____________________________________________  RADIOLOGY  Pertinent labs & imaging results that were available during my care of the patient were reviewed by me and considered in my medical decision making (see chart for details). If possible, patient and/or family made aware of any abnormal findings.  Dg Chest 2 View  Result Date: 07/24/2017 CLINICAL DATA:  Chest pain EXAM: CHEST  2 VIEW COMPARISON:  06/27/2017 chest radiograph. FINDINGS: Stable cardiomediastinal silhouette with normal heart size. No pneumothorax. No pleural effusion. Lungs appear clear, with no acute consolidative airspace disease and no pulmonary edema. IMPRESSION: No active cardiopulmonary disease. Electronically Signed   By: Ilona Sorrel M.D.   On: 07/24/2017 14:52   ____________________________________________    PROCEDURES  Procedure(s) performed: None  Procedures  Critical Care performed: None  ____________________________________________   INITIAL IMPRESSION / ASSESSMENT AND PLAN / ED COURSE  Pertinent labs & imaging results that were available during my care of the patient were reviewed by me and considered in my medical  decision making (see chart for details).  With chronic abdominal pain is with abdominal pain and vomiting, this is a common recurrent problem for her unfortunately she has had extensive workup of this including multiple CT scans EGD etc.  All of them have been reassuring. Multiple other workups of various other parts of her body that have been reassuring in the MRIs and etc.  In any event, she was sent to sleep and into the room which makes me think that she is not in significant pain as it is very difficult to sleep  there and agonizing discomfort, her belly is nonsurgical, blood work thus far quite reassuring, liver function tests and lipase are pending, urinalysis is pending, we are giving her antiemetics and IV fluid and we will see if she feels better.  ----------------------------------------- 7:23 PM on 07/24/2017 -----------------------------------------  Still sound asleep in the room, we will not or has not given [place] a urine sample, we talked her about it she rolls over and states she wants another blanket.  No evidence of dehydration clinically or on her blood work thus far.  We have given her a liter of fluid.  We will offer her a catheterization to see if there is something going on there we will wait for lipase and hepatic functions, and we will continue to assess her.  ----------------------------------------- 8:19 PM on 07/24/2017 -----------------------------------------  Please sleep in the room when she wakes up she states she still has a tummy ache, however, she is no longer nauseated, she would like to try p.o. challenge, all of her workup is reassuring very slight ketones in her urine, we did give her a liter of fluid for that, otherwise no acute pathology noted, I wonder if she may be in withdrawal from something for her recurrent abdominal pain but in any event I do not think will solve it today and I think that the risk of cancer far outweighs the likelihood of finding  something by CT I do not think ultrasound is indicated either.  We will see if we can get the patient safely home.  UDS is pending.  ----------------------------------------- 8:38 PM on 07/24/2017 -----------------------------------------  DS does show positive again for cannabinoids, the certainly could cause these symptoms given cannabis hyperemesis she is no longer vomiting and my thinking is that we can get her safely home.  I do not think that the radiation burden is indicated for further CT scanning etc.  ----------------------------------------- 9:26 PM on 07/24/2017 ----------------------------------------- Pt roundly cursed [place] all for not giving her narcotics even though she was asleep for most of her stay.  She refused to sign papers and was aggressive and profane. We discharged her.    ____________________________________________   FINAL CLINICAL IMPRESSION(S) / ED DIAGNOSES  Final diagnoses:  None      This chart was dictated using voice recognition software.  Despite best efforts to proofread,  errors can occur which can change meaning.      Schuyler Amor, MD 07/24/17 Dorthula Perfect    Schuyler Amor, MD 07/24/17 1924    Schuyler Amor, MD 07/24/17 2020    Schuyler Amor, MD 07/24/17 2038    Schuyler Amor, MD 07/24/17 2127

## 2017-07-24 NOTE — ED Notes (Signed)
Patient refused vital signs 

## 2017-08-01 ENCOUNTER — Telehealth: Payer: Self-pay

## 2017-08-01 NOTE — Telephone Encounter (Signed)
I received a phone call from Ashley who called the patient this morning to do a health assessment. She called use to see if we could get the patient in today. She states the patient is complaining of nausea, vomiting and right lower abdominal pain. I informed her that this is not a acute issue and the patient have been evaluated for this symptom and also currently followed by a gastroenterologist for the same symptoms. I informed her that I will call the patient and triage. She verbalize understanding.    I called the patient and she informed me that she still got nausea, right lower abdominal pain, but no vomiting since yesterday. I recommended that she f/u with the Gastroenterologist Department.

## 2017-08-01 NOTE — Telephone Encounter (Signed)
Thank you.  This is appropriate action.  Pt also needs to make sure she is truly taking all medications as prescribed.  She needs to follow lifestyle recommendations for diet and exercise as well. In past, she has not been on full med / lifestyle regimen.

## 2017-08-05 ENCOUNTER — Ambulatory Visit (INDEPENDENT_AMBULATORY_CARE_PROVIDER_SITE_OTHER): Payer: BLUE CROSS/BLUE SHIELD | Admitting: Gastroenterology

## 2017-08-05 ENCOUNTER — Telehealth: Payer: Self-pay

## 2017-08-05 ENCOUNTER — Encounter: Payer: Self-pay | Admitting: Gastroenterology

## 2017-08-05 VITALS — BP 143/93 | HR 98 | Temp 98.0°F | Ht 67.0 in | Wt 235.6 lb

## 2017-08-05 DIAGNOSIS — R1031 Right lower quadrant pain: Secondary | ICD-10-CM

## 2017-08-05 DIAGNOSIS — Z791 Long term (current) use of non-steroidal anti-inflammatories (NSAID): Secondary | ICD-10-CM | POA: Diagnosis not present

## 2017-08-05 DIAGNOSIS — R635 Abnormal weight gain: Secondary | ICD-10-CM | POA: Diagnosis not present

## 2017-08-05 DIAGNOSIS — K581 Irritable bowel syndrome with constipation: Secondary | ICD-10-CM | POA: Diagnosis not present

## 2017-08-05 MED ORDER — PEG 3350-KCL-NA BICARB-NACL 420 G PO SOLR
4000.0000 mL | Freq: Once | ORAL | 0 refills | Status: AC
Start: 2017-08-05 — End: 2017-08-05

## 2017-08-05 MED ORDER — DICYCLOMINE HCL 10 MG PO CAPS: 10.0000 mg | ORAL_CAPSULE | Freq: Three times a day (TID) | ORAL | 0 refills | Status: DC

## 2017-08-05 NOTE — Addendum Note (Signed)
Addended by: Peggye Ley on: 08/05/2017 02:03 PM   Modules accepted: Orders, SmartSet

## 2017-08-05 NOTE — Progress Notes (Signed)
Jonathon Bellows MD, MRCP(U.K) [address]  [place]  [place], [place] [address]  Main: [phone]  Fax: [phone]   Primary Care Physician: Mikey College, NP  Primary Gastroenterologist:  Dr. Jonathon Bellows   Chief Complaint  Patient presents with  . Nausea  . Emesis  . cold sweats    HPI: Jasmine Mason is a 32 y.o. female    Summary of history :  She was initially seen when referred in 06/2017 for gastritis. She was seen in the ER on 06/26/2017 with intermittent chest pain felt like a sensation of burning.  Had prior episodes of vomiting.  She was not on a PPI at that point of time.  Based on the ER note it appears cardiac evaluation was in process but at that point of time the patient received a phone call with a family member of hers was dying and was in hospice discharged urgently.  She actually discharged AMA.Labs on 06/26/2017 showed a hemoglobin of 13.7 and MCV of 88.1.  BMP was normal,.  D-dimer is negative and troponin was negative.   She says that she has been sick , throwing up since 12/2016 . Throws up 15 times, when she eats , feels it sits in her throat , chest hurts when she eats. No issues when walking or exerting herself. Also has had abdominal pain since last week , pain is in her epigastrium , continuous, throwing up helps. Worse by laying flat . Eating makes it worse. Pain is worse right after eating . Had been using  goodie powder- upto 2-3 times a day. Trying to stop smoking. Denies any illegal drug use or marijuana use.   EGD 1 year back was normal.   She takes hydrocodone on and off : has been prescribed it for abdominal pain by the ER.   Interval history   07/04/2017-  08/05/2016   07/05/17: EGD 6 cm hiatal hernia-normal gastric biopsies . Gastric emptying study ordered but not done  Further ER visit on 07/24/17 for nause and vomiting .   Gained 3 lbs. Still takjes alleve on a regular basis,  Still having sickness, has a bowel movement -  once every few days , gets sick while she is having a bowel movement . Hurts on her side and has nausea, gets better a bit after a bowel movement , Never had a colonoscopy .    Current Outpatient Medications  Medication Sig Dispense Refill  . Adapalene 0.3 % gel APPLY A THIN LAYER TO FACE QHS AND WASH OFF QAM  3  . docusate sodium (COLACE) 100 MG capsule Take 1 capsule (100 mg total) by mouth 2 (two) times daily. 10 capsule 0  . doxycycline (VIBRAMYCIN) 50 MG capsule Take 1 capsule by mouth daily.  2  . fluticasone (FLONASE) 50 MCG/ACT nasal spray Place 2 sprays into both nostrils daily. 16 g 6  . hydrochlorothiazide (HYDRODIURIL) 12.5 MG tablet TAKE 1 TABLET(12.5 MG) BY MOUTH DAILY 30 tablet 0  . HYDROcodone-acetaminophen (NORCO/VICODIN) 5-325 MG tablet Take 1-2 tablets by mouth every 6 (six) hours as needed.    . nicotine (NICODERM CQ - DOSED IN MG/24 HR) 7 mg/24hr patch Place 1 patch (7 mg total) onto the skin daily. 28 patch 0  . ondansetron (ZOFRAN) 4 MG tablet Take 1 tablet (4 mg total) by mouth every 8 (eight) hours as needed for nausea or vomiting. 8 tablet 0  . pantoprazole (PROTONIX) 40 MG tablet TAKE 1 TABLET(40 MG) BY  MOUTH TWICE DAILY 60 tablet 2  . promethazine (PHENERGAN) 12.5 MG tablet Take 0.5-1 tablets (6.25-12.5 mg total) by mouth every 6 (six) hours as needed for nausea or vomiting. 30 tablet 0  . sucralfate (CARAFATE) 1 g tablet Take 1 tablet (1 g total) by mouth 4 (four) times daily. 120 tablet 1  . norethindrone-ethinyl estradiol-iron (LOESTRIN FE 1.5/30) 1.5-30 MG-MCG tablet Take 1 tablet by mouth at bedtime for 28 days. 1 Package 11   No current facility-administered medications for this visit.     Allergies as of 08/05/2017  . (No Known Allergies)  BP (!) 143/93 (BP Location: Right Arm, Patient Position: Sitting, Cuff Size: Large)   Pulse 98   Temp 98 F (36.7 C) (Oral)   Ht 5\' 7"  (1.702 m)   Wt 235 lb 9.6 oz (106.9 kg)   BMI 36.90 kg/m   ROS:  General:  Negative for anorexia, weight loss, fever, chills, fatigue, weakness. ENT: Negative for hoarseness, difficulty swallowing , nasal congestion. CV: Negative for chest pain, angina, palpitations, dyspnea on exertion, peripheral edema.  Respiratory: Negative for dyspnea at rest, dyspnea on exertion, cough, sputum, wheezing.  GI: See history of present illness. GU:  Negative for dysuria, hematuria, urinary incontinence, urinary frequency, nocturnal urination.  Endo: Negative for unusual weight change.    Physical Examination:   BP (!) 143/93 (BP Location: Right Arm, Patient Position: Sitting, Cuff Size: Large)   Pulse 98   Temp 98 F (36.7 C) (Oral)   Ht 5\' 7"  (1.702 m)   Wt 235 lb 9.6 oz (106.9 kg)   BMI 36.90 kg/m   General: Well-nourished, well-developed in no acute distress.  Eyes: No icterus. Conjunctivae pink. Mouth: Oropharyngeal mucosa moist and pink , no lesions erythema or exudate. Lungs: Clear to auscultation bilaterally. Non-labored. Heart: Regular rate and rhythm, no murmurs rubs or gallops.  Abdomen: Bowel sounds are normal, nontender, nondistended, no hepatosplenomegaly or masses, no abdominal bruits or hernia , no rebound or guarding.   Extremities: No lower extremity edema. No clubbing or deformities. Neuro: Alert and oriented x 3.  Grossly intact. Skin: Warm and dry, no jaundice.   Psych: Alert and cooperative, normal mood and affect.   Imaging Studies: Dg Chest 2 View  Result Date: 07/24/2017 CLINICAL DATA:  Chest pain EXAM: CHEST  2 VIEW COMPARISON:  06/27/2017 chest radiograph. FINDINGS: Stable cardiomediastinal silhouette with normal heart size. No pneumothorax. No pleural effusion. Lungs appear clear, with no acute consolidative airspace disease and no pulmonary edema. IMPRESSION: No active cardiopulmonary disease. Electronically Signed   By: Ilona Sorrel M.D.   On: 07/24/2017 14:52    Assessment and Plan:   Jasmine Mason is a 32 y.o. y/o female here to follow  up for persistent nausea and vomiting. 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    Plan  1.Stop smoking , stop nsaids 2. Trulance for constipation 2 weeks samples provided  3. Dietary consult to lose weight 4. Continue on Protonix 5. Carafate QID 6. Gastric emptying study .  7. Colonoscopy for abdominal pain and severe constipation  8. Bentyl for pain PRN  I have discussed alternative options, risks & benefits,  which include, but are not limited to, bleeding, infection, perforation,respiratory complication & drug reaction.  The patient agrees with this plan & written consent will be obtained.     Dr Jonathon Bellows  MD,MRCP [facility]) Follow up in 3 months .

## 2017-08-05 NOTE — Addendum Note (Signed)
Addended by: Peggye Ley on: 08/05/2017 03:22 PM   Modules accepted: Orders

## 2017-08-05 NOTE — Addendum Note (Signed)
Addended by: Peggye Ley on: 08/05/2017 02:18 PM   Modules accepted: Orders

## 2017-08-05 NOTE — Telephone Encounter (Signed)
Advised patient of gastric emptying study.  [place] on 1/26 @ 830am.   NPO Midnight.  No meds after midnight  Procedure takes 4 hours.

## 2017-08-08 ENCOUNTER — Ambulatory Visit: Payer: BLUE CROSS/BLUE SHIELD | Admitting: Registered Nurse

## 2017-08-08 ENCOUNTER — Other Ambulatory Visit: Payer: Self-pay

## 2017-08-08 ENCOUNTER — Ambulatory Visit
Admission: RE | Admit: 2017-08-08 | Discharge: 2017-08-08 | Disposition: A | Discharge status: Home / Self Care | Payer: BLUE CROSS/BLUE SHIELD | Source: Ambulatory Visit | Attending: Gastroenterology | Admitting: Gastroenterology

## 2017-08-08 ENCOUNTER — Encounter
Admission: RE | Disposition: A | Discharge status: Home / Self Care | Payer: Self-pay | Source: Ambulatory Visit | Attending: Gastroenterology

## 2017-08-08 ENCOUNTER — Encounter: Payer: Self-pay | Admitting: Student

## 2017-08-08 DIAGNOSIS — E669 Obesity, unspecified: Secondary | ICD-10-CM | POA: Diagnosis not present

## 2017-08-08 DIAGNOSIS — F17210 Nicotine dependence, cigarettes, uncomplicated: Secondary | ICD-10-CM | POA: Insufficient documentation

## 2017-08-08 DIAGNOSIS — I10 Essential (primary) hypertension: Secondary | ICD-10-CM | POA: Diagnosis not present

## 2017-08-08 DIAGNOSIS — D123 Benign neoplasm of transverse colon: Secondary | ICD-10-CM

## 2017-08-08 DIAGNOSIS — Z79899 Other long term (current) drug therapy: Secondary | ICD-10-CM | POA: Insufficient documentation

## 2017-08-08 DIAGNOSIS — K219 Gastro-esophageal reflux disease without esophagitis: Secondary | ICD-10-CM | POA: Diagnosis not present

## 2017-08-08 DIAGNOSIS — K581 Irritable bowel syndrome with constipation: Secondary | ICD-10-CM

## 2017-08-08 DIAGNOSIS — Z6836 Body mass index (BMI) 36.0-36.9, adult: Secondary | ICD-10-CM | POA: Diagnosis not present

## 2017-08-08 DIAGNOSIS — R1031 Right lower quadrant pain: Secondary | ICD-10-CM

## 2017-08-08 DIAGNOSIS — E282 Polycystic ovarian syndrome: Secondary | ICD-10-CM | POA: Diagnosis not present

## 2017-08-08 DIAGNOSIS — K5900 Constipation, unspecified: Secondary | ICD-10-CM | POA: Insufficient documentation

## 2017-08-08 HISTORY — PX: COLONOSCOPY WITH PROPOFOL: SHX5780

## 2017-08-08 LAB — HM COLONOSCOPY

## 2017-08-08 LAB — POCT PREGNANCY, URINE: Preg Test, Ur: NEGATIVE

## 2017-08-08 SURGERY — COLONOSCOPY WITH PROPOFOL
Anesthesia: General

## 2017-08-08 MED ORDER — MIDAZOLAM HCL 2 MG/2ML IJ SOLN
INTRAMUSCULAR | Status: AC
  Filled 2017-08-08: qty 2

## 2017-08-08 MED ORDER — LIDOCAINE HCL (PF) 2 % IJ SOLN
INTRAMUSCULAR | Status: AC
  Filled 2017-08-08: qty 10

## 2017-08-08 MED ORDER — LIDOCAINE HCL (CARDIAC) 20 MG/ML IV SOLN
INTRAVENOUS | Status: DC | PRN
  Administered 2017-08-08: 40 mg via INTRAVENOUS

## 2017-08-08 MED ORDER — PROPOFOL 10 MG/ML IV BOLUS
INTRAVENOUS | Status: DC | PRN
  Administered 2017-08-08: 70 mg via INTRAVENOUS

## 2017-08-08 MED ORDER — SODIUM CHLORIDE 0.9 % IV SOLN: INTRAVENOUS | Status: DC

## 2017-08-08 MED ORDER — IPRATROPIUM-ALBUTEROL 0.5-2.5 (3) MG/3ML IN SOLN: 3.0000 mL | Freq: Four times a day (QID) | RESPIRATORY_TRACT | Status: DC | PRN

## 2017-08-08 MED ORDER — PROPOFOL 10 MG/ML IV BOLUS
INTRAVENOUS | Status: AC
  Filled 2017-08-08: qty 20

## 2017-08-08 MED ORDER — PROPOFOL 500 MG/50ML IV EMUL
INTRAVENOUS | Status: DC | PRN
  Administered 2017-08-08: 150 ug/kg/min via INTRAVENOUS

## 2017-08-08 NOTE — Anesthesia Post-op Follow-up Note (Signed)
Anesthesia QCDR form completed.        

## 2017-08-08 NOTE — Anesthesia Procedure Notes (Signed)
Date/Time: 08/08/2017 9:55 AM Performed by: Doreen Salvage, CRNA Pre-anesthesia Checklist: Patient identified, Emergency Drugs available, Suction available and Patient being monitored Patient Re-evaluated:Patient Re-evaluated prior to induction Oxygen Delivery Method: Nasal cannula Induction Type: IV induction Dental Injury: Teeth and Oropharynx as per pre-operative assessment  Comments: Nasal cannula with etCO2 monitoring

## 2017-08-08 NOTE — Op Note (Signed)
[facility] Gastroenterology Patient Name: Jasmine Mason Procedure Date: 08/08/2017 9:54 AM MRN: [id number] Account #: [id number] Date of Birth: 1986-04-27 Admit Type: Outpatient Age: 32 Room: [facility] ENDO ROOM 1 Gender: Female Note Status: Finalized Procedure:            Colonoscopy Indications:          Constipation Providers:            Jonathon Bellows MD, MD Referring MD:         No Local Md, MD (Referring MD) Medicines:            Monitored Anesthesia Care Complications:        No immediate complications. Procedure:            Pre-Anesthesia Assessment:                       - Prior to the procedure, a History and Physical was                        performed, and patient medications, allergies and                        sensitivities were reviewed. The patient's tolerance of                        previous anesthesia was reviewed.                       - The risks and benefits of the procedure and the                        sedation options and risks were discussed with the                        patient. All questions were answered and informed                        consent was obtained.                       - ASA Grade Assessment: II - A patient with mild                        systemic disease.                       After obtaining informed consent, the colonoscope was                        passed under direct vision. Throughout the procedure,                        the patient's blood pressure, pulse, and oxygen                        saturations were monitored continuously. The                        Colonoscope was introduced through the anus and                        advanced to  the the cecum, identified by the                        appendiceal orifice, IC valve and transillumination.                        The colonoscopy was performed with ease. The patient                        tolerated the procedure well. The quality of the bowel         preparation was good. Findings:      A 3 mm polyp was found in the transverse colon. The polyp was sessile.       The polyp was removed with a cold biopsy forceps. Resection and       retrieval were complete.      The exam was otherwise without abnormality on direct and retroflexion       views. Impression:           - One 3 mm polyp in the transverse colon, removed with                        a cold biopsy forceps. Resected and retrieved.                       - The examination was otherwise normal on direct and                        retroflexion views. Recommendation:       - Discharge patient to home.                       - Resume previous diet.                       - Continue present medications.                       - Await pathology results.                       - Repeat colonoscopy for surveillance based on                        pathology results.                       - Return to GI office in 6 weeks. Procedure Code(s):    --- Professional ---                       [phone], Colonoscopy, flexible; with biopsy, single or                        multiple Diagnosis Code(s):    --- Professional ---                       D12.3, Benign neoplasm of transverse colon (hepatic                        flexure or splenic flexure)  K59.00, Constipation, unspecified CPT copyright 2016 American Medical Association. All rights reserved. The codes documented in this report are preliminary and upon coder review may  be revised to meet current compliance requirements. Jonathon Bellows, MD Jonathon Bellows MD, MD 08/08/2017 10:16:32 AM This report has been signed electronically. Number of Addenda: 0 Note Initiated On: 08/08/2017 9:54 AM Scope Withdrawal Time: 0 hours 12 minutes 22 seconds  Total Procedure Duration: 0 hours 14 minutes 15 seconds       [facility]

## 2017-08-08 NOTE — Anesthesia Postprocedure Evaluation (Signed)
Anesthesia Post Note  Patient: Jasmine Mason  Procedure(s) Performed: COLONOSCOPY WITH PROPOFOL (N/A )  Patient location during evaluation: Endoscopy Anesthesia Type: General Level of consciousness: awake and alert Pain management: pain level controlled Vital Signs Assessment: post-procedure vital signs reviewed and stable Respiratory status: spontaneous breathing, nonlabored ventilation, respiratory function stable and patient connected to nasal cannula oxygen Cardiovascular status: blood pressure returned to baseline and stable Postop Assessment: no apparent nausea or vomiting Anesthetic complications: no     Last Vitals:  Vitals:   08/08/17 1020 08/08/17 1030  BP: 125/82 122/86  Pulse:    Resp: 20 20  Temp: (!) 36.1 C   SpO2: 97%     Last Pain:  Vitals:   08/08/17 1020  TempSrc: Tympanic                 Martha Clan

## 2017-08-08 NOTE — Anesthesia Preprocedure Evaluation (Signed)
Anesthesia Evaluation  Patient identified by MRN, date of birth, ID band Patient awake    Reviewed: Allergy & Precautions, NPO status , Patient's Chart, lab work & pertinent test results  History of Anesthesia Complications Negative for: history of anesthetic complications  Airway Mallampati: IV  TM Distance: >3 FB Neck ROM: Full    Dental no notable dental hx.    Pulmonary neg COPD, Current Smoker,    breath sounds clear to auscultation- rhonchi (-) wheezing      Cardiovascular hypertension, Pt. on medications (-) CAD, (-) Past MI and (-) Cardiac Stents  Rhythm:Regular Rate:Normal - Systolic murmurs and - Diastolic murmurs    Neuro/Psych negative neurological ROS  negative psych ROS   GI/Hepatic Neg liver ROS, hiatal hernia, GERD  ,  Endo/Other  negative endocrine ROSneg diabetes  Renal/GU negative Renal ROS     Musculoskeletal negative musculoskeletal ROS (+)   Abdominal (+) + obese,   Peds  Hematology negative hematology ROS (+)   Anesthesia Other Findings Past Medical History: No date: Back pain No date: Back pain No date: GERD (gastroesophageal reflux disease) No date: Hypertension No date: Mesenteric adenitis No date: Obesity No date: PCOS (polycystic ovarian syndrome) No date: Tobacco abuse   Reproductive/Obstetrics                             Anesthesia Physical  Anesthesia Plan  ASA: II  Anesthesia Plan: General   Post-op Pain Management:    Induction: Intravenous  PONV Risk Score and Plan: 2 and Propofol infusion  Airway Management Planned: Natural Airway  Additional Equipment:   Intra-op Plan:   Post-operative Plan:   Informed Consent: I have reviewed the patients History and Physical, chart, labs and discussed the procedure including the risks, benefits and alternatives for the proposed anesthesia with the patient or authorized representative who has  indicated his/her understanding and acceptance.   Dental advisory given  Plan Discussed with: CRNA and Anesthesiologist  Anesthesia Plan Comments:         Anesthesia Quick Evaluation

## 2017-08-08 NOTE — H&P (Signed)
Jasmine Bellows, MD [address], [place], [place], [place], [address] [address], [place], [place], [place], [address] Phone: [phone]  Fax: [phone]  Primary Care Physician:  Jasmine College, NP   Pre-Procedure History & Physical: HPI:  Jasmine Mason is a 32 y.o. female is here for an colonoscopy.   Past Medical History:  Diagnosis Date  . Back pain   . Back pain   . GERD (gastroesophageal reflux disease)   . Hypertension   . Mesenteric adenitis   . Obesity   . PCOS (polycystic ovarian syndrome)   . Tobacco abuse     Past Surgical History:  Procedure Laterality Date  . CARPAL TUNNEL RELEASE Left ~ 2013  . ESOPHAGOGASTRODUODENOSCOPY N/A 01/25/2015   Procedure: ESOPHAGOGASTRODUODENOSCOPY (EGD);  Surgeon: Jasmine Shipper, MD;  Location[facility]COPY;  Service: Endoscopy;  Laterality: N/A;  . ESOPHAGOGASTRODUODENOSCOPY (EGD) WITH PROPOFOL N/A 03/13/2016   Procedure: ESOPHAGOGASTRODUODENOSCOPY (EGD) WITH PROPOFOL;  Surgeon: Jasmine Lame, MD;  Location: [facility] ENDOSCOPY;  Service: Endoscopy;  Laterality: N/A;  . ESOPHAGOGASTRODUODENOSCOPY (EGD) WITH PROPOFOL N/A 07/05/2017   Procedure: ESOPHAGOGASTRODUODENOSCOPY (EGD) WITH PROPOFOL;  Surgeon: Jasmine Bellows, MD;  Location: [facility] ENDOSCOPY;  Service: Gastroenterology;  Laterality: N/A;  . FRACTURE SURGERY    . ORIF ANKLE FRACTURE Left 1990's    Prior to Admission medications   Medication Sig Start Date End Date Taking? Authorizing Provider  Adapalene 0.3 % gel APPLY A THIN LAYER TO FACE QHS AND Jasmine Mason OFF QAM 05/29/17  Yes [provider]  alum & mag hydroxide-simeth (MAALOX MAX) 578-469-62 MG/5ML suspension Take 5 mLs by mouth every 6 (six) hours as needed for indigestion.   Yes [provider]  fluticasone (FLONASE) 50 MCG/ACT nasal spray Place 2 sprays into both nostrils daily. 06/27/17  Yes Jasmine College, NP  hydrochlorothiazide (HYDRODIURIL) 12.5 MG tablet TAKE 1 TABLET(12.5 MG) BY MOUTH DAILY  05/27/17  Yes Jasmine College, NP  pantoprazole (PROTONIX) 40 MG tablet TAKE 1 TABLET(40 MG) BY MOUTH TWICE DAILY 05/30/17  Yes Jasmine College, NP  dicyclomine (BENTYL) 10 MG capsule Take 1 capsule (10 mg total) by mouth 4 (four) times daily -  before meals and at bedtime. 08/05/17   Jasmine Bellows, MD  docusate sodium (COLACE) 100 MG capsule Take 1 capsule (100 mg total) by mouth 2 (two) times daily. 06/27/17   Jasmine College, NP  doxycycline (VIBRAMYCIN) 50 MG capsule Take 1 capsule by mouth daily. 05/27/17   [provider]  HYDROcodone-acetaminophen (NORCO/VICODIN) 5-325 MG tablet Take 1-2 tablets by mouth every 6 (six) hours as needed. 09/27/14   [provider]  nicotine (NICODERM CQ - DOSED IN MG/24 HR) 7 mg/24hr patch Place 1 patch (7 mg total) onto the skin daily. 04/12/17   Jasmine College, NP  norethindrone-ethinyl estradiol-iron (LOESTRIN FE 1.5/30) 1.5-30 MG-MCG tablet Take 1 tablet by mouth at bedtime for 28 days. 06/27/17 07/25/17  Jasmine College, NP  ondansetron (ZOFRAN) 4 MG tablet Take 1 tablet (4 mg total) by mouth every 8 (eight) hours as needed for nausea or vomiting. 07/24/17   Jasmine Amor, MD  promethazine (PHENERGAN) 12.5 MG tablet Take 0.5-1 tablets (6.25-12.5 mg total) by mouth every 6 (six) hours as needed for nausea or vomiting. Patient not taking: Reported on 08/08/2017 06/27/17   Jasmine College, NP  sucralfate (CARAFATE) 1 g tablet Take 1 tablet (1 g total) by mouth 4 (four) times daily. Patient not taking: Reported on  08/08/2017 07/04/17   Jasmine Bellows, MD    Allergies as of 08/05/2017  . (No Known Allergies)    Family History  Problem Relation Age of Onset  . Hypertension Mother   . Hypertension Father   . Diabetes Father   . Heart disease Father   . Hypertension Sister   . Throat cancer Maternal Grandfather   . Stroke Paternal Grandmother   . Diabetes Paternal Grandmother   . Ovarian cancer Neg Hx   . Colon  cancer Neg Hx   . Breast cancer Neg Hx     Social History   Socioeconomic History  . Marital status: Single    Spouse name: Not on file  . Number of children: Not on file  . Years of education: Not on file  . Highest education level: Not on file  Social Needs  . Financial resource strain: Not on file  . Food insecurity - worry: Not on file  . Food insecurity - inability: Not on file  . Transportation needs - medical: Not on file  . Transportation needs - non-medical: Not on file  Occupational History  . Not on file  Tobacco Use  . Smoking status: Current Every Day Smoker    Packs/day: 0.25    Years: 10.00    Pack years: 2.50    Types: Cigarettes  . Smokeless tobacco: Never Used  Substance and Sexual Activity  . Alcohol use: No    Alcohol/week: 0.0 oz  . Drug use: No  . Sexual activity: Yes    Birth control/protection: Condom, Pill  Other Topics Concern  . Not on file  Social History Narrative  . Not on file    Review of Systems: See HPI, otherwise negative ROS  Physical Exam: BP 127/76   Pulse (!) 101   Temp 97.7 F (36.5 C) (Tympanic)   Resp 19   Ht 5\' 7"  (1.702 m)   Wt 235 lb (106.6 kg)   SpO2 100%   BMI 36.81 kg/m  General:   Alert,  pleasant and cooperative in NAD Head:  Normocephalic and atraumatic. Neck:  Supple; no masses or thyromegaly. Lungs:  Clear throughout to auscultation, normal respiratory effort.    Heart:  +S1, +S2, Regular rate and rhythm, No edema. Abdomen:  Soft, nontender and nondistended. Normal bowel sounds, without guarding, and without rebound.   Neurologic:  Alert and  oriented x4;  grossly normal neurologically.  Impression/Plan: Jasmine Mason is here for an colonoscopy to be performed for constipation  Risks, benefits, limitations, and alternatives regarding  colonoscopy have been reviewed with the patient.  Questions have been answered.  All parties agreeable.   Jasmine Bellows, MD  08/08/2017, 9:17 AM

## 2017-08-08 NOTE — Transfer of Care (Signed)
Immediate Anesthesia Transfer of Care Note  Patient: Jasmine Mason  Procedure(s) Performed: Procedure(s): COLONOSCOPY WITH PROPOFOL (N/A)  Patient Location: PACU and Endoscopy Unit  Anesthesia Type:General  Level of Consciousness: sedated  Airway & Oxygen Therapy: Patient Spontanous Breathing and Patient connected to nasal cannula oxygen  Post-op Assessment: Report given to RN and Post -op Vital signs reviewed and stable  Post vital signs: Reviewed and stable  Last Vitals:  Vitals:   08/08/17 0909 08/08/17 1020  BP: 127/76 125/82  Pulse: (!) 101   Resp: 19 20  Temp: 36.5 C (!) (P) 36.1 C  SpO2: 834% 19%    Complications: No apparent anesthesia complications

## 2017-08-09 ENCOUNTER — Encounter: Payer: Self-pay | Admitting: Gastroenterology

## 2017-08-09 LAB — SURGICAL PATHOLOGY

## 2017-08-13 ENCOUNTER — Telehealth: Payer: Self-pay

## 2017-08-13 NOTE — Telephone Encounter (Signed)
Advised patient of results per Dr. Vicente Males.    - Polyp was benign -colonoscopy at age 32 unless she has a family history of colon cancer or polyps then will be age 62   Patient states she does not have a family history of colon cancer/polyps.

## 2017-08-17 ENCOUNTER — Ambulatory Visit
Admission: RE | Admit: 2017-08-17 | Discharge: 2017-08-17 | Disposition: A | Discharge status: Home / Self Care | Payer: BLUE CROSS/BLUE SHIELD | Source: Ambulatory Visit | Attending: Gastroenterology | Admitting: Gastroenterology

## 2017-08-17 DIAGNOSIS — K30 Functional dyspepsia: Secondary | ICD-10-CM | POA: Diagnosis not present

## 2017-08-17 DIAGNOSIS — R1114 Bilious vomiting: Secondary | ICD-10-CM | POA: Insufficient documentation

## 2017-08-17 MED ORDER — TECHNETIUM TC 99M SULFUR COLLOID
1.9000 | Freq: Once | INTRAVENOUS | Status: AC | PRN
  Administered 2017-08-17: 1.9 via ORAL

## 2017-08-19 ENCOUNTER — Encounter: Payer: Self-pay | Admitting: Nurse Practitioner

## 2017-08-19 ENCOUNTER — Telehealth: Payer: Self-pay

## 2017-08-19 DIAGNOSIS — K30 Functional dyspepsia: Secondary | ICD-10-CM | POA: Insufficient documentation

## 2017-08-19 NOTE — Telephone Encounter (Signed)
LVM for patient callback for results per Dr. Vicente Males.    - inform has delayed gastric emptying, provide information on condition and diet , schedule follow up to discuss further in office.

## 2017-08-20 ENCOUNTER — Ambulatory Visit (INDEPENDENT_AMBULATORY_CARE_PROVIDER_SITE_OTHER): Payer: BLUE CROSS/BLUE SHIELD | Admitting: Gastroenterology

## 2017-08-20 DIAGNOSIS — K5904 Chronic idiopathic constipation: Secondary | ICD-10-CM

## 2017-08-20 DIAGNOSIS — K3184 Gastroparesis: Secondary | ICD-10-CM

## 2017-08-20 MED ORDER — PLECANATIDE 3 MG PO TABS: 1.0000 | ORAL_TABLET | Freq: Every day | ORAL | 1 refills | Status: AC

## 2017-08-20 NOTE — Progress Notes (Signed)
Jonathon Bellows MD, MRCP(U.K) [address]  [place]  [place], [place] [address]  Main: [phone]  Fax: [phone]   Primary Care Physician: Mikey College, NP  Primary Gastroenterologist:  Dr. Jonathon Bellows   No chief complaint on file.   HPI: Jasmine Mason is a 32 y.o. female   Summary of history :  She was initially seen when referred in 06/2017 for gastritis. She was seen in the ER on 06/26/2017 with intermittent chest pain felt like a sensation of burning. Had prior episodes of vomiting. She was not on a PPI at that point of time. Labs on 06/26/2017 showed a hemoglobin of 13.7 and MCV of 88.1.BMP was normal,.D-dimer is negative and troponin was negative.She says that she has been sick , throwing up since 12/2016 . Throws up 15 times, when she eats , feels it sits in her throat , chest hurts when she eats. No issues when walking or exerting herself. Also has had abdominal pain since last week , pain is in her epigastrium , continuous, throwing up helps. Worse by laying flat . Eating makes it worse. Pain is worse right after eating . Had been using  goodie powder- upto 2-3 times a day. Trying to stop smoking. Denies any illegal drug use or marijuana use.    She takes hydrocodone on and off : has been prescribed it for abdominal pain by the ER. 07/05/17: EGD 6 cm hiatal hernia-normal gastric biopsies . Further ER visit on 07/24/17 for nause and vomiting .    Interval history  08/05/2016 -08/20/17 08/08/17- colonoscopy was normal   08/17/17- delayed gastric emptying on gastric emptying study  Stopped NSAID's.   Has a bowel movement once a day , feels sick when she is having a bowel movement .  Trulance worked .Did not meet the nutrition specialist.      Current Outpatient Medications  Medication Sig Dispense Refill  . Adapalene 0.3 % gel APPLY A THIN LAYER TO FACE QHS AND WASH OFF QAM  3  . alum & mag hydroxide-simeth (MAALOX MAX) 400-400-40 MG/5ML  suspension Take 5 mLs by mouth every 6 (six) hours as needed for indigestion.    . dicyclomine (BENTYL) 10 MG capsule Take 1 capsule (10 mg total) by mouth 4 (four) times daily -  before meals and at bedtime. 90 capsule 0  . docusate sodium (COLACE) 100 MG capsule Take 1 capsule (100 mg total) by mouth 2 (two) times daily. 10 capsule 0  . doxycycline (VIBRAMYCIN) 50 MG capsule Take 1 capsule by mouth daily.  2  . fluticasone (FLONASE) 50 MCG/ACT nasal spray Place 2 sprays into both nostrils daily. 16 g 6  . hydrochlorothiazide (HYDRODIURIL) 12.5 MG tablet TAKE 1 TABLET(12.5 MG) BY MOUTH DAILY 30 tablet 0  . HYDROcodone-acetaminophen (NORCO/VICODIN) 5-325 MG tablet Take 1-2 tablets by mouth every 6 (six) hours as needed.    . nicotine (NICODERM CQ - DOSED IN MG/24 HR) 7 mg/24hr patch Place 1 patch (7 mg total) onto the skin daily. 28 patch 0  . norethindrone-ethinyl estradiol-iron (LOESTRIN FE 1.5/30) 1.5-30 MG-MCG tablet Take 1 tablet by mouth at bedtime for 28 days. 1 Package 11  . ondansetron (ZOFRAN) 4 MG tablet Take 1 tablet (4 mg total) by mouth every 8 (eight) hours as needed for nausea or vomiting. 8 tablet 0  . pantoprazole (PROTONIX) 40 MG tablet TAKE 1 TABLET(40 MG) BY MOUTH TWICE DAILY 60 tablet 2  . promethazine (PHENERGAN) 12.5 MG tablet Take  0.5-1 tablets (6.25-12.5 mg total) by mouth every 6 (six) hours as needed for nausea or vomiting. (Patient not taking: Reported on 08/08/2017) 30 tablet 0  . sucralfate (CARAFATE) 1 g tablet Take 1 tablet (1 g total) by mouth 4 (four) times daily. (Patient not taking: Reported on 08/08/2017) 120 tablet 1   No current facility-administered medications for this visit.     Allergies as of 08/20/2017  . (No Known Allergies)    ROS:  General: Negative for anorexia, weight loss, fever, chills, fatigue, weakness. ENT: Negative for hoarseness, difficulty swallowing , nasal congestion. CV: Negative for chest pain, angina, palpitations, dyspnea on  exertion, peripheral edema.  Respiratory: Negative for dyspnea at rest, dyspnea on exertion, cough, sputum, wheezing.  GI: See history of present illness. GU:  Negative for dysuria, hematuria, urinary incontinence, urinary frequency, nocturnal urination.  Endo: Negative for unusual weight change.    Physical Examination:   LMP 08/01/2017   General: Well-nourished, well-developed in no acute distress.  Eyes: No icterus. Conjunctivae pink. Mouth: Oropharyngeal mucosa moist and pink , no lesions erythema or exudate. Lungs: Clear to auscultation bilaterally. Non-labored. Heart: Regular rate and rhythm, no murmurs rubs or gallops.  Abdomen: Bowel sounds are normal, nontender, nondistended, no hepatosplenomegaly or masses, no abdominal bruits or hernia , no rebound or guarding.   Extremities: No lower extremity edema. No clubbing or deformities. Neuro: Alert and oriented x 3.  Grossly intact. Skin: Warm and dry, no jaundice.   Psych: Alert and cooperative, normal mood and affect.   Imaging Studies: Dg Chest 2 View  Result Date: 07/24/2017 CLINICAL DATA:  Chest pain EXAM: CHEST  2 VIEW COMPARISON:  06/27/2017 chest radiograph. FINDINGS: Stable cardiomediastinal silhouette with normal heart size. No pneumothorax. No pleural effusion. Lungs appear clear, with no acute consolidative airspace disease and no pulmonary edema. IMPRESSION: No active cardiopulmonary disease. Electronically Signed   By: Ilona Sorrel M.D.   On: 07/24/2017 14:52   Nm Gastric Emptying  Result Date: 08/17/2017 CLINICAL DATA:  Nausea and vomiting for several months EXAM: NUCLEAR MEDICINE GASTRIC EMPTYING SCAN TECHNIQUE: After oral ingestion of radiolabeled meal, sequential abdominal images were obtained for 4 hours. Percentage of activity emptying the stomach was calculated at 1 hour, 2 hour, 3 hour, and 4 hours. RADIOPHARMACEUTICALS:  1.9 mCi Tc-8m sulfur colloid in standardized meal COMPARISON:  None. FINDINGS: Expected  location of the stomach in the left upper quadrant. Ingested meal empties the stomach gradually over the course of the study. 20% emptied at 1 hr ( normal >= 10%) 50% emptied at 2 hr ( normal >= 40%) 63% emptied at 3 hr ( normal >= 70%) 70% emptied at 4 hr ( normal >= 90%) IMPRESSION: Initial normal gastric emptying. The gastric emptying is somewhat delayed at the 3 and 4 hour time frames Electronically Signed   By: Inez Catalina M.D.   On: 08/17/2017 15:50    Assessment and Plan:   Jasmine Mason is a 32 y.o. y/o femalehere to follow up for persistent nausea and vomiting. Very likely a combination from gastroparesis and severe constipation   Plan  1.Stop smoking  2. Trulance for constipation 2 weeks samples provided  3. Continue on Protonix 4. Gastroparesis diet   Dr Jonathon Bellows  MD,MRCP [facility]) Follow up in 12 weeks

## 2017-09-10 ENCOUNTER — Emergency Department (HOSPITAL_BASED_OUTPATIENT_CLINIC_OR_DEPARTMENT_OTHER)
Admission: EM | Admit: 2017-09-10 | Discharge: 2017-09-11 | Disposition: A | Discharge status: Home / Self Care | Payer: BLUE CROSS/BLUE SHIELD | Attending: Emergency Medicine | Admitting: Emergency Medicine

## 2017-09-10 ENCOUNTER — Other Ambulatory Visit: Payer: Self-pay

## 2017-09-10 ENCOUNTER — Other Ambulatory Visit: Payer: Self-pay | Admitting: Nurse Practitioner

## 2017-09-10 ENCOUNTER — Encounter (HOSPITAL_BASED_OUTPATIENT_CLINIC_OR_DEPARTMENT_OTHER): Payer: Self-pay

## 2017-09-10 DIAGNOSIS — I10 Essential (primary) hypertension: Secondary | ICD-10-CM | POA: Insufficient documentation

## 2017-09-10 DIAGNOSIS — R102 Pelvic and perineal pain: Secondary | ICD-10-CM | POA: Insufficient documentation

## 2017-09-10 DIAGNOSIS — M549 Dorsalgia, unspecified: Secondary | ICD-10-CM | POA: Diagnosis not present

## 2017-09-10 DIAGNOSIS — Z79899 Other long term (current) drug therapy: Secondary | ICD-10-CM | POA: Diagnosis not present

## 2017-09-10 DIAGNOSIS — N939 Abnormal uterine and vaginal bleeding, unspecified: Secondary | ICD-10-CM | POA: Diagnosis not present

## 2017-09-10 DIAGNOSIS — F17210 Nicotine dependence, cigarettes, uncomplicated: Secondary | ICD-10-CM | POA: Insufficient documentation

## 2017-09-10 NOTE — ED Triage Notes (Signed)
Pt c/o abdominal pain and vaginal bleeding for the last two weeks, pt states she goes through 24 pads a day with clots.

## 2017-09-11 LAB — BASIC METABOLIC PANEL
ANION GAP: 10 (ref 5–15)
BUN: 12 mg/dL (ref 6–20)
CALCIUM: 8.6 mg/dL — AB (ref 8.9–10.3)
CO2: 25 mmol/L (ref 22–32)
CREATININE: 0.68 mg/dL (ref 0.44–1.00)
Chloride: 103 mmol/L (ref 101–111)
Glucose, Bld: 130 mg/dL — ABNORMAL HIGH (ref 65–99)
Potassium: 3.2 mmol/L — ABNORMAL LOW (ref 3.5–5.1)
SODIUM: 138 mmol/L (ref 135–145)

## 2017-09-11 LAB — TSH: TSH: 0.791 u[IU]/mL (ref 0.350–4.500)

## 2017-09-11 LAB — WET PREP, GENITAL
Clue Cells Wet Prep HPF POC: NONE SEEN
SPERM: NONE SEEN
Trich, Wet Prep: NONE SEEN
YEAST WET PREP: NONE SEEN

## 2017-09-11 LAB — CBC
HCT: 39.8 % (ref 36.0–46.0)
Hemoglobin: 13.2 g/dL (ref 12.0–15.0)
MCH: 29.7 pg (ref 26.0–34.0)
MCHC: 33.2 g/dL (ref 30.0–36.0)
MCV: 89.4 fL (ref 78.0–100.0)
PLATELETS: 295 10*3/uL (ref 150–400)
RBC: 4.45 MIL/uL (ref 3.87–5.11)
RDW: 12.8 % (ref 11.5–15.5)
WBC: 10.7 10*3/uL — AB (ref 4.0–10.5)

## 2017-09-11 LAB — PREGNANCY, URINE: Preg Test, Ur: NEGATIVE

## 2017-09-11 MED ORDER — TRAMADOL HCL 50 MG PO TABS: 50.0000 mg | ORAL_TABLET | Freq: Four times a day (QID) | ORAL | 0 refills | Status: DC | PRN

## 2017-09-11 MED ORDER — IBUPROFEN 800 MG PO TABS: 800.0000 mg | ORAL_TABLET | Freq: Three times a day (TID) | ORAL | 0 refills | Status: DC

## 2017-09-11 MED ORDER — MEDROXYPROGESTERONE ACETATE 5 MG PO TABS: 5.0000 mg | ORAL_TABLET | Freq: Every day | ORAL | 0 refills | Status: DC

## 2017-09-11 NOTE — ED Provider Notes (Signed)
[facility] EMERGENCY DEPARTMENT Provider Note   CSN: [id number] Arrival date & time: 09/10/17  2341     History   Chief Complaint Chief Complaint  Patient presents with  . Vaginal Bleeding    HPI Jasmine Mason is a 32 y.o. female.  Presents to the emergency department for evaluation of heavy vaginal bleeding.  Patient reports that she has been bleeding for 2 weeks.  She has been experiencing pelvic cramping, back pain and cramping and passage of clots.  She has started to feel somewhat weak and at times short of breath.  She reports that she has never had bleeding like this before, usually has regular bleeding for 1 week of the month but not heavy.      Past Medical History:  Diagnosis Date  . Back pain   . Back pain   . GERD (gastroesophageal reflux disease)   . Hypertension   . Mesenteric adenitis   . Obesity   . PCOS (polycystic ovarian syndrome)   . Tobacco abuse     Patient Active Problem List   Diagnosis Date Noted  . 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 Encounter for smoking cessation counseling 04/18/2017  . Essential hypertension 04/18/2017  . Epigastric abdominal pain   . Acute gastritis 09/20/2016  . Nausea with vomiting   . Hiatal hernia   . Uncontrollable vomiting 03/10/2016  . Abdominal pain, epigastric   . GERD (gastroesophageal reflux disease) 01/24/2015  . Hypokalemia 01/24/2015  . Abdominal pain 01/24/2015  . Leukocytosis 01/24/2015  . Chest pain 01/24/2015  . Cough 01/24/2015  . Pain of left calf 01/24/2015  . Elevated troponin 01/24/2015  . Obesity   . Mesenteric adenitis   . Tobacco abuse     Past Surgical History:  Procedure Laterality Date  . CARPAL TUNNEL RELEASE Left ~ 2013  . COLONOSCOPY WITH PROPOFOL N/A 08/08/2017   Procedure: COLONOSCOPY WITH PROPOFOL;  Surgeon: Jonathon Bellows, MD;  Location: [facility] ENDOSCOPY;  Service: Gastroenterology;  Laterality: N/A;  . ESOPHAGOGASTRODUODENOSCOPY N/A 01/25/2015   Procedure:  ESOPHAGOGASTRODUODENOSCOPY (EGD);  Surgeon: Irene Shipper, MD;  Location: [facility] ENDOSCOPY;  Service: Endoscopy;  Laterality: N/A;  . ESOPHAGOGASTRODUODENOSCOPY (EGD) WITH PROPOFOL N/A 03/13/2016   Procedure: ESOPHAGOGASTRODUODENOSCOPY (EGD) WITH PROPOFOL;  Surgeon: Lucilla Lame, MD;  Location: [facility] ENDOSCOPY;  Service: Endoscopy;  Laterality: N/A;  . ESOPHAGOGASTRODUODENOSCOPY (EGD) WITH PROPOFOL N/A 07/05/2017   Procedure: ESOPHAGOGASTRODUODENOSCOPY (EGD) WITH PROPOFOL;  Surgeon: Jonathon Bellows, MD;  Location: [facility] ENDOSCOPY;  Service: Gastroenterology;  Laterality: N/A;  . FRACTURE SURGERY    . ORIF ANKLE FRACTURE Left 1990's    OB History    Gravida Para Term Preterm AB Living   0 0 0 0 0 0   SAB TAB Ectopic Multiple Live Births   0 0 0 0 0       Home Medications    Prior to Admission medications   Medication Sig Start Date End Date Taking? Authorizing Provider  Adapalene 0.3 % gel APPLY A THIN LAYER TO FACE QHS AND [place] OFF QAM 05/29/17   [provider]  alum & mag hydroxide-simeth (MAALOX MAX) 725-366-44 MG/5ML suspension Take 5 mLs by mouth every 6 (six) hours as needed for indigestion.    [provider]  dicyclomine (BENTYL) 10 MG capsule Take 1 capsule (10 mg total) by mouth 4 (four) times daily -  before meals and at bedtime. 08/05/17   Jonathon Bellows, MD  docusate sodium (COLACE) 100 MG capsule Take 1 capsule (100 mg  total) by mouth 2 (two) times daily. 06/27/17   Mikey College, NP  doxycycline (VIBRAMYCIN) 50 MG capsule Take 1 capsule by mouth daily. 05/27/17   [provider]  fluticasone (FLONASE) 50 MCG/ACT nasal spray Place 2 sprays into both nostrils daily. 06/27/17   Mikey College, NP  hydrochlorothiazide (HYDRODIURIL) 12.5 MG tablet TAKE 1 TABLET BY MOUTH ONCE DAILY 09/10/17   Mikey College, NP  HYDROcodone-acetaminophen (NORCO/VICODIN) 5-325 MG tablet Take 1-2 tablets by mouth every 6 (six) hours as needed. 09/27/14   [provider]  ibuprofen (ADVIL,MOTRIN) 800 MG tablet Take 1 tablet (800 mg total) by mouth 3 (three) times daily. 09/11/17   Orpah Greek, MD  medroxyPROGESTERone (PROVERA) 5 MG tablet Take 1 tablet (5 mg total) by mouth daily. Take if bleeding becomes heavy again 09/11/17   Pollina, Gwenyth Allegra, MD  nicotine (NICODERM CQ - DOSED IN MG/24 HR) 7 mg/24hr patch Place 1 patch (7 mg total) onto the skin daily. 04/12/17   Mikey College, NP  norethindrone-ethinyl estradiol-iron (LOESTRIN FE 1.5/30) 1.5-30 MG-MCG tablet Take 1 tablet by mouth at bedtime for 28 days. 06/27/17 07/25/17  Mikey College, NP  ondansetron (ZOFRAN) 4 MG tablet Take 1 tablet (4 mg total) by mouth every 8 (eight) hours as needed for nausea or vomiting. 07/24/17   Schuyler Amor, MD  pantoprazole (PROTONIX) 40 MG tablet TAKE 1 TABLET(40 MG) BY MOUTH TWICE DAILY 05/30/17   Mikey College, NP  Plecanatide (TRULANCE) 3 MG TABS Take 1 tablet by mouth daily. 08/20/17 11/18/17  Jonathon Bellows, MD  promethazine (PHENERGAN) 12.5 MG tablet Take 0.5-1 tablets (6.25-12.5 mg total) by mouth every 6 (six) hours as needed for nausea or vomiting. Patient not taking: Reported on 08/08/2017 06/27/17   Mikey College, NP  sucralfate (CARAFATE) 1 g tablet Take 1 tablet (1 g total) by mouth 4 (four) times daily. Patient not taking: Reported on 08/08/2017 07/04/17   Jonathon Bellows, MD  traMADol (ULTRAM) 50 MG tablet Take 1 tablet (50 mg total) by mouth every 6 (six) hours as needed. 09/11/17   Orpah Greek, MD    Family History Family History  Problem Relation Age of Onset  . Hypertension Mother   . Hypertension Father   . Diabetes Father   . Heart disease Father   . Hypertension Sister   . Throat cancer Maternal Grandfather   . Stroke Paternal Grandmother   . Diabetes Paternal Grandmother   . Ovarian cancer Neg Hx   . Colon cancer Neg Hx   . Breast cancer Neg Hx     Social History Social History    Tobacco Use  . Smoking status: Current Every Day Smoker    Packs/day: 0.25    Years: 10.00    Pack years: 2.50    Types: Cigarettes  . Smokeless tobacco: Never Used  Substance Use Topics  . Alcohol use: No    Alcohol/week: 0.0 oz  . Drug use: No     Allergies   Patient has no known allergies.   Review of Systems Review of Systems  Constitutional: Positive for fatigue.  Respiratory: Positive for shortness of breath.   Genitourinary: Positive for pelvic pain and vaginal bleeding.  All other systems reviewed and are negative.    Physical Exam Updated Vital Signs BP 112/77 (BP Location: Left Arm)   Pulse 75   Temp 98.2 F (36.8 C) (Oral)   Resp 18   Ht 5\' 6"  (  1.676 m)   Wt 104.3 kg (230 lb)   SpO2 99%   BMI 37.12 kg/m   Physical Exam  Constitutional: She is oriented to person, place, and time. She appears well-developed and well-nourished. No distress.  HENT:  Head: Normocephalic and atraumatic.  Right Ear: Hearing normal.  Left Ear: Hearing normal.  Nose: Nose normal.  Mouth/Throat: Oropharynx is clear and moist and mucous membranes are normal.  Eyes: Conjunctivae and EOM are normal. Pupils are equal, round, and reactive to light.  Neck: Normal range of motion. Neck supple.  Cardiovascular: Regular rhythm, S1 normal and S2 normal. Exam reveals no gallop and no friction rub.  No murmur heard. Pulmonary/Chest: Effort normal and breath sounds normal. No respiratory distress. She exhibits no tenderness.  Abdominal: Soft. Normal appearance and bowel sounds are normal. There is no hepatosplenomegaly. There is no tenderness. There is no rebound, no guarding, no tenderness at McBurney's point and negative Murphy's sign. No hernia.  Genitourinary: Vagina normal. Uterus is tender. Uterus is not enlarged. Cervix exhibits no motion tenderness and no discharge. Right adnexum displays no mass and no tenderness. Left adnexum displays no mass and no tenderness.   Musculoskeletal: Normal range of motion.  Neurological: She is alert and oriented to person, place, and time. She has normal strength. No cranial nerve deficit or sensory deficit. Coordination normal. GCS eye subscore is 4. GCS verbal subscore is 5. GCS motor subscore is 6.  Skin: Skin is warm, dry and intact. No rash noted. No cyanosis.  Psychiatric: She has a normal mood and affect. Her speech is normal and behavior is normal. Thought content normal.  Nursing note and vitals reviewed.    ED Treatments / Results  Labs (all labs ordered are listed, but only abnormal results are displayed) Labs Reviewed  WET PREP, GENITAL - Abnormal; Notable for the following components:      Result Value   WBC, Wet Prep HPF POC MODERATE (*)    All other components within normal limits  CBC - Abnormal; Notable for the following components:   WBC 10.7 (*)    All other components within normal limits  BASIC METABOLIC PANEL - Abnormal; Notable for the following components:   Potassium 3.2 (*)    Glucose, Bld 130 (*)    Calcium 8.6 (*)    All other components within normal limits  PREGNANCY, URINE  TSH  GC/CHLAMYDIA PROBE AMP ([place]) NOT AT [facility]    EKG  EKG Interpretation None       Radiology No results found.  Procedures Procedures (including critical care time)  Medications Ordered in ED Medications - No data to display   Initial Impression / Assessment and Plan / ED Course  I have reviewed the triage vital signs and the nursing notes.  Pertinent labs & imaging results that were available during my care of the patient were reviewed by me and considered in my medical decision making (see chart for details).     Presents for evaluation of heavy vaginal bleeding.  She reports that she has been bleeding for 2 weeks.  Vital signs are stable here in the ER, no hypoxia, no hypotension.  Pelvic examination revealed normal cervix that is closed, no discharge, no cervical motion  tenderness.  She does not have any ongoing bleeding at this time.  Hemoglobin is stable.  Patient reassured, she will be treated with analgesia including high-dose ibuprofen.  She is to follow-up with her OB/GYN doctor.  She was given a  prescription for Provera to be initiated only if bleeding worsens.  Final Clinical Impressions(s) / ED Diagnoses   Final diagnoses:  Abnormal uterine bleeding    ED Discharge Orders        Ordered    ibuprofen (ADVIL,MOTRIN) 800 MG tablet  3 times daily     09/11/17 0143    traMADol (ULTRAM) 50 MG tablet  Every 6 hours PRN     09/11/17 0143    medroxyPROGESTERone (PROVERA) 5 MG tablet  Daily     09/11/17 0143       Orpah Greek, MD 09/11/17 [phone]

## 2017-09-12 LAB — GC/CHLAMYDIA PROBE AMP (~~LOC~~) NOT AT ARMC
CHLAMYDIA, DNA PROBE: NEGATIVE
NEISSERIA GONORRHEA: NEGATIVE

## 2017-10-20 IMAGING — CT CT ABD-PELV W/ CM
2 of 4 series · 17 of 46 positions shown, 19 images · IV contrast (iopamidol)
Comparison: CT abdomen and pelvis November 17, 2015

CLINICAL DATA: Vomiting for 1 day. Leukocytosis. History of
mesenteric adenitis and obesity.

EXAM:
CT ABDOMEN AND PELVIS WITH CONTRAST
TECHNIQUE: Multidetector CT imaging of the abdomen and pelvis was performed
using the standard protocol following bolus administration of
intravenous contrast.
CONTRAST:  100mL 77XZXU-1II IOPAMIDOL (77XZXU-1II) INJECTION 61%

[Series 2: routine abd pel with · axial · 0.89mm/px · z∈[-528,-64]mm · 14 of 101 slices shown, 16 images]
[im 4/101  soft-tissue]
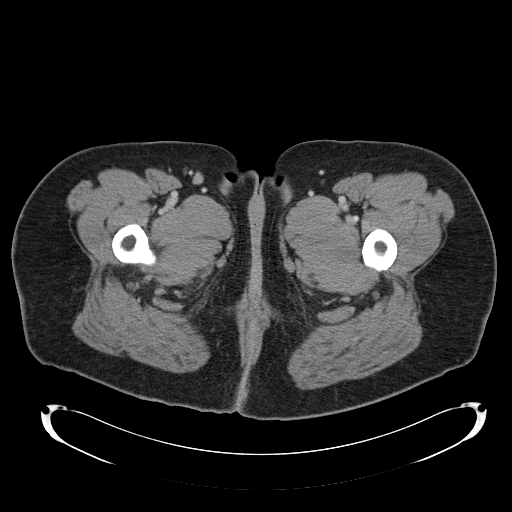
[im 4/101  bone]
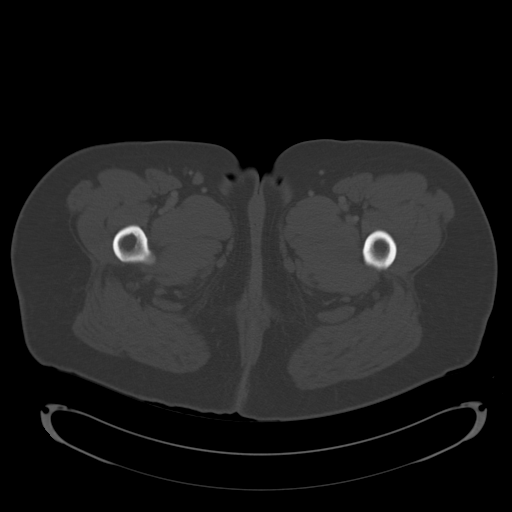
[im 12/101  soft-tissue]
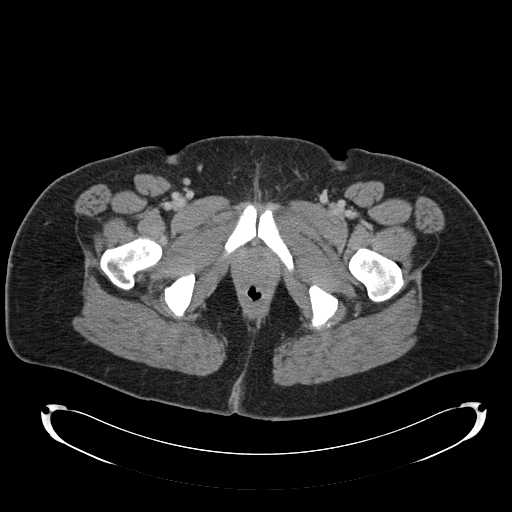
[im 20/101  soft-tissue]
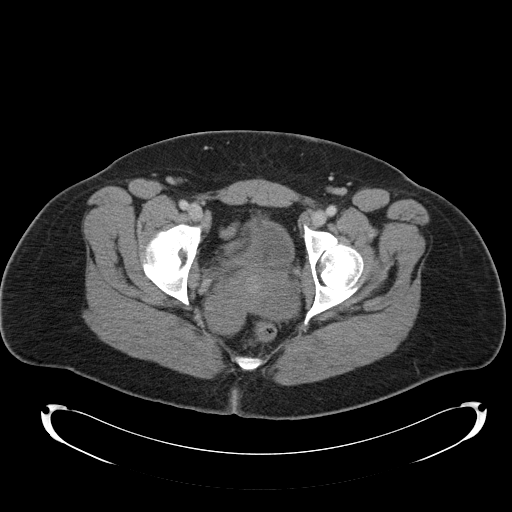
[im 27/101  soft-tissue]
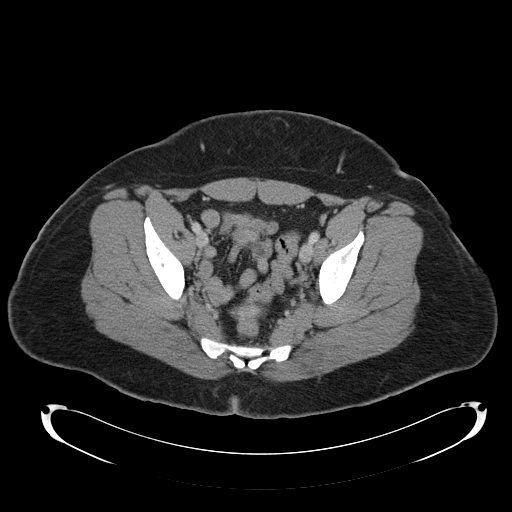
[im 35/101  soft-tissue]
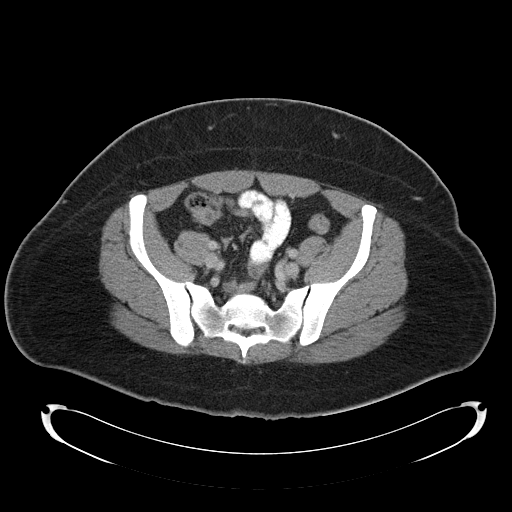
[im 39/101  soft-tissue]
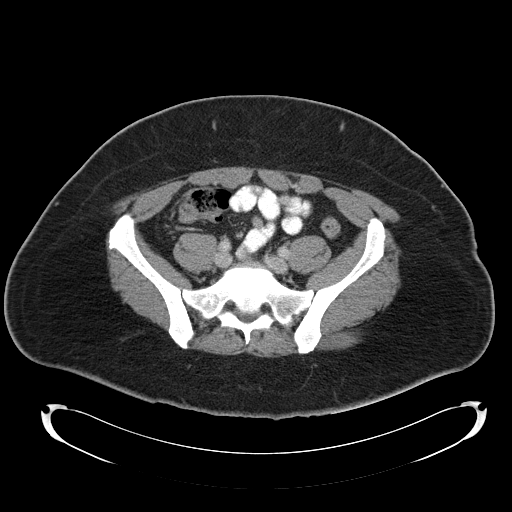
[im 47/101  soft-tissue]
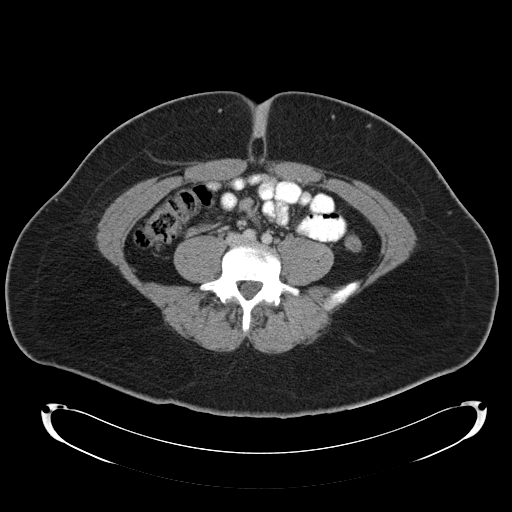
[im 54/101  soft-tissue]
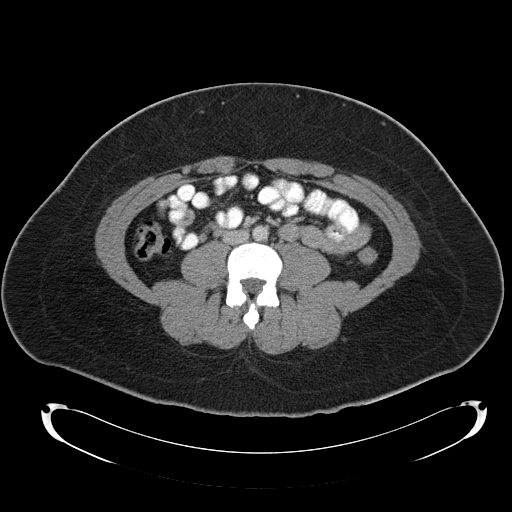
[im 62/101  soft-tissue]
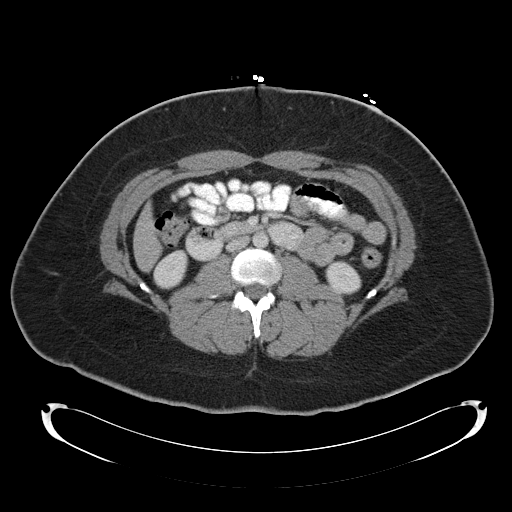
[im 62/101  bone]
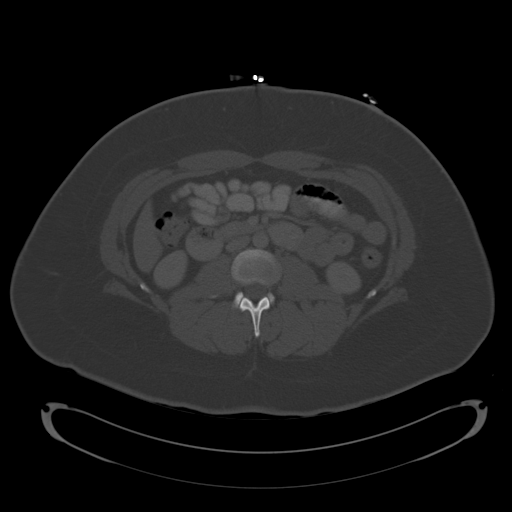
[im 66/101  soft-tissue]
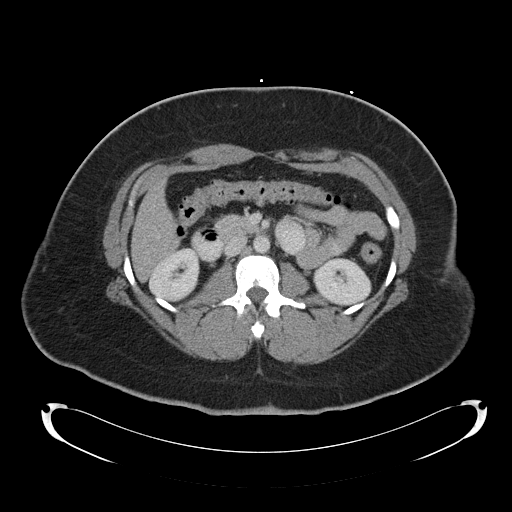
[im 74/101  soft-tissue]
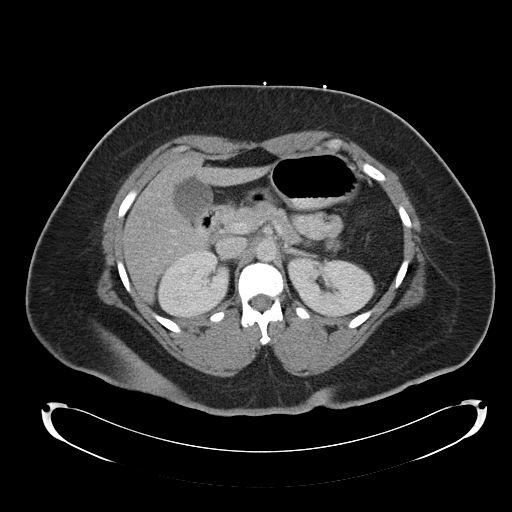
[im 81/101  soft-tissue]
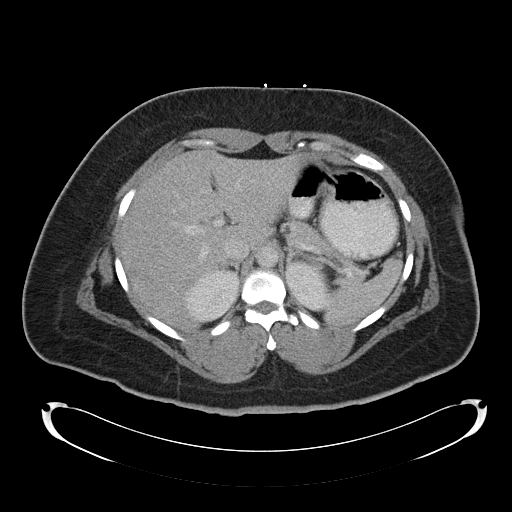
[im 89/101  soft-tissue]
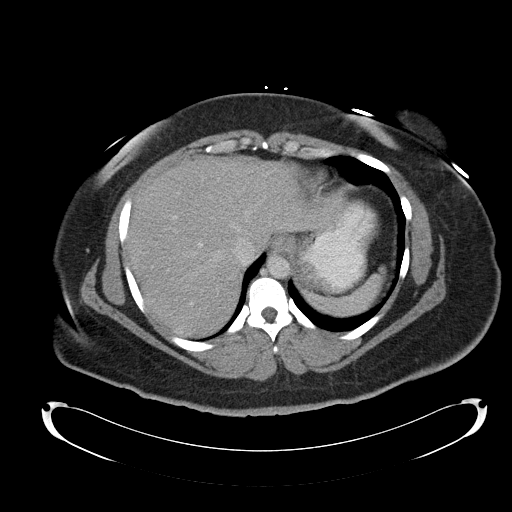
[im 97/101  soft-tissue]
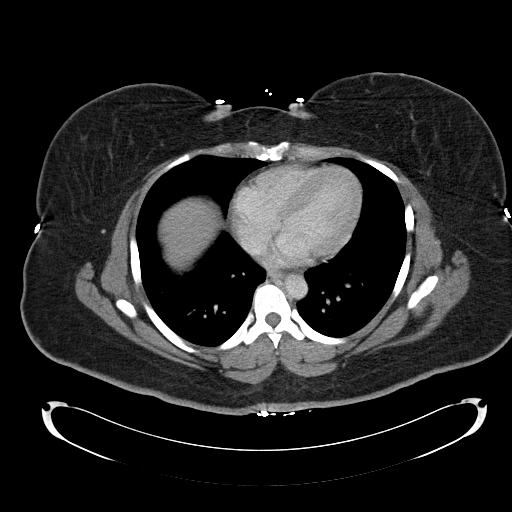

[Series 5: cor routine abd pel with · coronal · 1.01mm/px · 3 of 147 slices shown]
[im 49/147  soft-tissue]
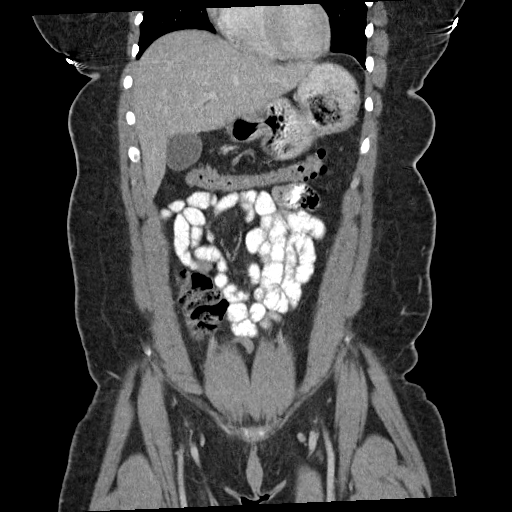
[im 65/147  soft-tissue]
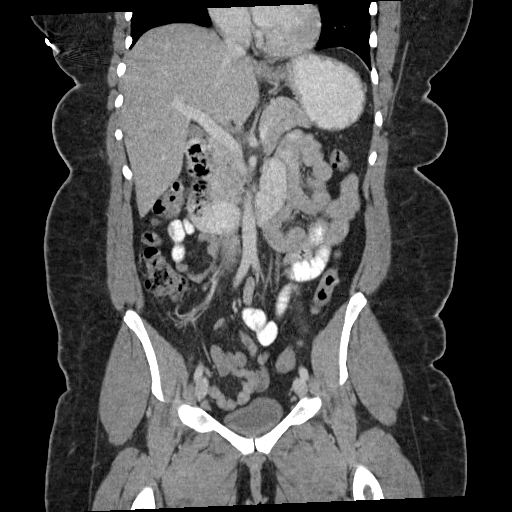
[im 82/147  soft-tissue]
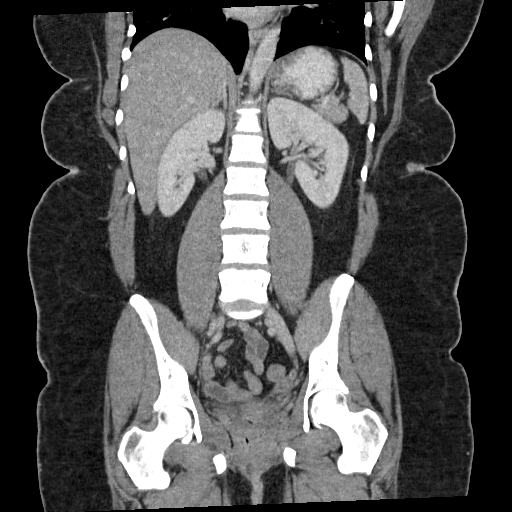

[17 of 46 positions shown; findings below may reference images not displayed]

FINDINGS: LUNG BASES: Included view of the lung bases are clear. Visualized
heart and pericardium are unremarkable.

SOLID ORGANS: The liver is diffusely mildly hypodense most
compatible with he steatosis with focal fatty infiltration at the
falciform ligament. Spleen, gallbladder, pancreas and adrenal glands
are unremarkable.

GASTROINTESTINAL TRACT: The stomach, small and large bowel are
normal in course and caliber without inflammatory changes. Normal
appendix.

KIDNEYS/ URINARY TRACT: Kidneys are orthotopic, demonstrating
symmetric enhancement. No nephrolithiasis, hydronephrosis or solid
renal masses. The unopacified ureters are normal in course and
caliber. Urinary bladder is partially distended and unremarkable.

PERITONEUM/RETROPERITONEUM: Aortoiliac vessels are normal in course
and caliber. No lymphadenopathy by CT size criteria. Stable
prominent appearance of the adnexae. Uterus appears diminutive. No
intraperitoneal free fluid nor free air.

SOFT TISSUE/OSSEOUS STRUCTURES: Non-suspicious. Small fat containing
umbilical hernia. Small RIGHT Bartholin cyst.
IMPRESSION: No acute intra-abdominal or pelvic process.

Hypoplastic uterus with prominent adnexum, findings are unchanged.

## 2017-10-22 ENCOUNTER — Emergency Department
Admission: EM | Admit: 2017-10-22 | Discharge: 2017-10-22 | Disposition: A | Discharge status: Home / Self Care | Payer: BLUE CROSS/BLUE SHIELD | Attending: Emergency Medicine | Admitting: Emergency Medicine

## 2017-10-22 ENCOUNTER — Emergency Department: Payer: BLUE CROSS/BLUE SHIELD

## 2017-10-22 ENCOUNTER — Ambulatory Visit: Payer: BLUE CROSS/BLUE SHIELD | Admitting: Nurse Practitioner

## 2017-10-22 ENCOUNTER — Encounter: Payer: Self-pay | Admitting: Emergency Medicine

## 2017-10-22 ENCOUNTER — Other Ambulatory Visit: Payer: Self-pay

## 2017-10-22 DIAGNOSIS — Y999 Unspecified external cause status: Secondary | ICD-10-CM | POA: Insufficient documentation

## 2017-10-22 DIAGNOSIS — F17210 Nicotine dependence, cigarettes, uncomplicated: Secondary | ICD-10-CM | POA: Diagnosis not present

## 2017-10-22 DIAGNOSIS — Y92009 Unspecified place in unspecified non-institutional (private) residence as the place of occurrence of the external cause: Secondary | ICD-10-CM | POA: Diagnosis not present

## 2017-10-22 DIAGNOSIS — Y939 Activity, unspecified: Secondary | ICD-10-CM | POA: Insufficient documentation

## 2017-10-22 DIAGNOSIS — W228XXA Striking against or struck by other objects, initial encounter: Secondary | ICD-10-CM | POA: Diagnosis not present

## 2017-10-22 DIAGNOSIS — S60221A Contusion of right hand, initial encounter: Secondary | ICD-10-CM | POA: Diagnosis not present

## 2017-10-22 DIAGNOSIS — Z79899 Other long term (current) drug therapy: Secondary | ICD-10-CM | POA: Diagnosis not present

## 2017-10-22 DIAGNOSIS — I10 Essential (primary) hypertension: Secondary | ICD-10-CM | POA: Insufficient documentation

## 2017-10-22 DIAGNOSIS — S8001XA Contusion of right knee, initial encounter: Secondary | ICD-10-CM | POA: Insufficient documentation

## 2017-10-22 DIAGNOSIS — S8991XA Unspecified injury of right lower leg, initial encounter: Secondary | ICD-10-CM | POA: Diagnosis present

## 2017-10-22 MED ORDER — NAPROXEN 500 MG PO TABS: 500.0000 mg | ORAL_TABLET | Freq: Two times a day (BID) | ORAL | 0 refills | Status: AC

## 2017-10-22 MED ORDER — HYDROCODONE-ACETAMINOPHEN 5-325 MG PO TABS
1.0000 | ORAL_TABLET | Freq: Once | ORAL | Status: AC
  Administered 2017-10-22: 1 via ORAL
  Filled 2017-10-22: qty 1

## 2017-10-22 MED ORDER — CYCLOBENZAPRINE HCL 5 MG PO TABS: 5.0000 mg | ORAL_TABLET | Freq: Three times a day (TID) | ORAL | 0 refills | Status: DC | PRN

## 2017-10-22 NOTE — Discharge Instructions (Addendum)
Your exam and x-rays are negative for fracture or dislocation. Wear the ace bandages as needed for comfort. Apply ice to reduce pain and swelling. Take the prescription meds as directed. Follow-up with your provider for ongoing symptoms.
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
## 2017-10-22 NOTE — ED Notes (Signed)
Pt c/o pain to right hand and knee. No swelling or deformity noted to knee. Minimal swelling to hand. ROM intact in both, although painful with movement
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
## 2017-10-22 NOTE — ED Notes (Signed)

## 2017-10-23 ENCOUNTER — Ambulatory Visit: Payer: BLUE CROSS/BLUE SHIELD | Admitting: Nurse Practitioner

## 2017-10-30 ENCOUNTER — Other Ambulatory Visit: Payer: Self-pay | Admitting: Nurse Practitioner

## 2017-10-30 DIAGNOSIS — K219 Gastro-esophageal reflux disease without esophagitis: Secondary | ICD-10-CM

## 2017-11-11 ENCOUNTER — Ambulatory Visit: Payer: BLUE CROSS/BLUE SHIELD | Admitting: Gastroenterology

## 2017-11-12 ENCOUNTER — Emergency Department
Admission: EM | Admit: 2017-11-12 | Discharge: 2017-11-13 | Disposition: A | Discharge status: Home / Self Care | Payer: BLUE CROSS/BLUE SHIELD | Attending: Emergency Medicine | Admitting: Emergency Medicine

## 2017-11-12 ENCOUNTER — Encounter: Payer: Self-pay | Admitting: Emergency Medicine

## 2017-11-12 ENCOUNTER — Other Ambulatory Visit: Payer: Self-pay

## 2017-11-12 DIAGNOSIS — F17210 Nicotine dependence, cigarettes, uncomplicated: Secondary | ICD-10-CM | POA: Insufficient documentation

## 2017-11-12 DIAGNOSIS — R109 Unspecified abdominal pain: Secondary | ICD-10-CM

## 2017-11-12 DIAGNOSIS — R1013 Epigastric pain: Secondary | ICD-10-CM | POA: Insufficient documentation

## 2017-11-12 DIAGNOSIS — R1115 Cyclical vomiting syndrome unrelated to migraine: Secondary | ICD-10-CM

## 2017-11-12 DIAGNOSIS — G8929 Other chronic pain: Secondary | ICD-10-CM

## 2017-11-12 DIAGNOSIS — G43A0 Cyclical vomiting, not intractable: Secondary | ICD-10-CM | POA: Diagnosis not present

## 2017-11-12 DIAGNOSIS — Z79899 Other long term (current) drug therapy: Secondary | ICD-10-CM | POA: Diagnosis not present

## 2017-11-12 DIAGNOSIS — I10 Essential (primary) hypertension: Secondary | ICD-10-CM | POA: Insufficient documentation

## 2017-11-12 LAB — COMPREHENSIVE METABOLIC PANEL
ALK PHOS: 92 U/L (ref 38–126)
ALT: 22 U/L (ref 14–54)
ANION GAP: 11 (ref 5–15)
AST: 27 U/L (ref 15–41)
Albumin: 4.8 g/dL (ref 3.5–5.0)
BUN: 9 mg/dL (ref 6–20)
CALCIUM: 9.5 mg/dL (ref 8.9–10.3)
CHLORIDE: 100 mmol/L — AB (ref 101–111)
CO2: 27 mmol/L (ref 22–32)
CREATININE: 0.71 mg/dL (ref 0.44–1.00)
GFR calc non Af Amer: >60 mL/min (ref >60)
Glucose, Bld: 125 mg/dL — ABNORMAL HIGH (ref 65–99)
Potassium: 3.7 mmol/L (ref 3.5–5.1)
SODIUM: 138 mmol/L (ref 135–145)
Total Bilirubin: 0.7 mg/dL (ref 0.3–1.2)
Total Protein: 8.3 g/dL — ABNORMAL HIGH (ref 6.5–8.1)

## 2017-11-12 LAB — CBC
HCT: 47.9 % — ABNORMAL HIGH (ref 35.0–47.0)
Hemoglobin: 16.2 g/dL — ABNORMAL HIGH (ref 12.0–16.0)
MCH: 29.8 pg (ref 26.0–34.0)
MCHC: 33.9 g/dL (ref 32.0–36.0)
MCV: 88 fL (ref 80.0–100.0)
PLATELETS: 415 10*3/uL (ref 150–440)
RBC: 5.44 MIL/uL — AB (ref 3.80–5.20)
RDW: 13.9 % (ref 11.5–14.5)
WBC: 22.2 10*3/uL — ABNORMAL HIGH (ref 3.6–11.0)

## 2017-11-12 LAB — LIPASE, BLOOD: LIPASE: 23 U/L (ref 11–51)

## 2017-11-12 NOTE — ED Triage Notes (Addendum)
Patient ambulatory to triage with steady gait, without difficulty or distress noted; pt reports N/V since this am with burning to abd, right lower abd; hx of same; seen GI yesterday but missed appt; st prev hx of gastritis and rx protonix; pt with previous visits for same

## 2017-11-13 ENCOUNTER — Emergency Department: Payer: BLUE CROSS/BLUE SHIELD

## 2017-11-13 LAB — URINALYSIS, COMPLETE (UACMP) WITH MICROSCOPIC
Bilirubin Urine: NEGATIVE
Glucose, UA: NEGATIVE mg/dL
KETONES UR: 5 mg/dL — AB
Leukocytes, UA: NEGATIVE
Nitrite: NEGATIVE
PROTEIN: NEGATIVE mg/dL
Specific Gravity, Urine: 1.016 (ref 1.005–1.030)
pH: 6 (ref 5.0–8.0)

## 2017-11-13 LAB — POCT PREGNANCY, URINE: PREG TEST UR: NEGATIVE

## 2017-11-13 MED ORDER — ONDANSETRON 4 MG PO TBDP: ORAL_TABLET | ORAL | 0 refills | Status: DC

## 2017-11-13 MED ORDER — DIPHENHYDRAMINE HCL 50 MG/ML IJ SOLN
12.5000 mg | INTRAMUSCULAR | Status: AC
  Administered 2017-11-13: 12.5 mg via INTRAVENOUS
  Filled 2017-11-13: qty 1

## 2017-11-13 MED ORDER — HALOPERIDOL LACTATE 5 MG/ML IJ SOLN
5.0000 mg | Freq: Once | INTRAMUSCULAR | Status: AC
  Administered 2017-11-13: 5 mg via INTRAVENOUS
  Filled 2017-11-13: qty 1

## 2017-11-13 MED ORDER — SODIUM CHLORIDE 0.9 % IV BOLUS
1000.0000 mL | INTRAVENOUS | Status: AC
  Administered 2017-11-13: 1000 mL via INTRAVENOUS

## 2017-11-13 NOTE — ED Notes (Signed)
Patient transported to Ultrasound 

## 2017-11-13 NOTE — ED Notes (Signed)
Pt states she does not need to urinate at this time  ?

## 2017-11-13 NOTE — ED Provider Notes (Signed)
[facility] Emergency Department Provider Note  ____________________________________________   First MD Initiated Contact with Patient 11/13/17 0032     (approximate)  I have reviewed the triage vital signs and the nursing notes.   HISTORY  Chief Complaint Abdominal Pain    HPI Jasmine Mason is a 32 y.o. female who has a history of chronic episodic abdominal pain with nausea and vomiting who presents for evaluation of the same.  She reports that for about 24 hours she has been suffering from persistent vomiting and burning epigastric pain.  Nothing in particular makes it better or worse and she reports that she has not been able to keep down much of anything in the way of food or fluids.  She says that it all started yesterday after eating some pineapple and drinking some pineapple juice and thinks that may be it irritated her throat.  She reports a little bit of blood in her vomit the last time she threw up but feels like it might be coming from her throat because of all the vomiting.  She is currently in no distress and was resting comfortably in the exam room.  She denies fever/chills, chest pain, lower abdominal pain, and dysuria.  She has had similar symptoms in the past and she sees Dr. Vicente Males with GI.  In fact, she had an appointment with Dr. Vicente Males yesterday that she missed because she did not know the time of the appointment.  She typically has issues with constipation.  She has had numerous CT scans in the past as well as ultrasounds and has had both endoscopies and colonoscopies, all of which have been reassuring.   Past Medical History:  Diagnosis Date  . Back pain   . Back pain   . GERD (gastroesophageal reflux disease)   . Hypertension   . Mesenteric adenitis   . Obesity   . PCOS (polycystic ovarian syndrome)   . Tobacco abuse     Patient Active Problem List   Diagnosis Date Noted  . 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 Encounter for smoking  cessation counseling 04/18/2017  . Essential hypertension 04/18/2017  . Epigastric abdominal pain   . Acute gastritis 09/20/2016  . Nausea with vomiting   . Hiatal hernia   . Uncontrollable vomiting 03/10/2016  . Abdominal pain, epigastric   . GERD (gastroesophageal reflux disease) 01/24/2015  . Hypokalemia 01/24/2015  . Abdominal pain 01/24/2015  . Leukocytosis 01/24/2015  . Chest pain 01/24/2015  . Cough 01/24/2015  . Pain of left calf 01/24/2015  . Elevated troponin 01/24/2015  . Obesity   . Mesenteric adenitis   . Tobacco abuse     Past Surgical History:  Procedure Laterality Date  . CARPAL TUNNEL RELEASE Left ~ 2013  . COLONOSCOPY WITH PROPOFOL N/A 08/08/2017   Procedure: COLONOSCOPY WITH PROPOFOL;  Surgeon: Jonathon Bellows, MD;  Location: [facility] ENDOSCOPY;  Service: Gastroenterology;  Laterality: N/A;  . ESOPHAGOGASTRODUODENOSCOPY N/A 01/25/2015   Procedure: ESOPHAGOGASTRODUODENOSCOPY (EGD);  Surgeon: Irene Shipper, MD;  Location: [facility] ENDOSCOPY;  Service: Endoscopy;  Laterality: N/A;  . ESOPHAGOGASTRODUODENOSCOPY (EGD) WITH PROPOFOL N/A 03/13/2016   Procedure: ESOPHAGOGASTRODUODENOSCOPY (EGD) WITH PROPOFOL;  Surgeon: Lucilla Lame, MD;  Location: [facility] ENDOSCOPY;  Service: Endoscopy;  Laterality: N/A;  . ESOPHAGOGASTRODUODENOSCOPY (EGD) WITH PROPOFOL N/A 07/05/2017   Procedure: ESOPHAGOGASTRODUODENOSCOPY (EGD) WITH PROPOFOL;  Surgeon: Jonathon Bellows, MD;  Location: [facility] ENDOSCOPY;  Service: Gastroenterology;  Laterality: N/A;  . FRACTURE SURGERY    . ORIF ANKLE FRACTURE Left 1990's  Prior to Admission medications   Medication Sig Start Date End Date Taking? Authorizing Provider  Adapalene 0.3 % gel APPLY A THIN LAYER TO FACE QHS AND [place] OFF QAM 05/29/17   [provider]  alum & mag hydroxide-simeth (MAALOX MAX) [phone] MG/5ML suspension Take 5 mLs by mouth every 6 (six) hours as needed for indigestion.    [provider]  cyclobenzaprine (FLEXERIL) 5 MG tablet Take  1 tablet (5 mg total) by mouth 3 (three) times daily as needed for muscle spasms. 10/22/17   Menshew, Dannielle Karvonen, PA-C  dicyclomine (BENTYL) 10 MG capsule Take 1 capsule (10 mg total) by mouth 4 (four) times daily -  before meals and at bedtime. 08/05/17   Jonathon Bellows, MD  docusate sodium (COLACE) 100 MG capsule Take 1 capsule (100 mg total) by mouth 2 (two) times daily. 06/27/17   Mikey College, NP  doxycycline (VIBRAMYCIN) 100 MG capsule TK ONE C PO BID WF AND DRINK 10/08/17   [provider]  doxycycline (VIBRAMYCIN) 50 MG capsule Take 1 capsule by mouth daily. 05/27/17   [provider]  fluticasone (FLONASE) 50 MCG/ACT nasal spray Place 2 sprays into both nostrils daily. 06/27/17   Mikey College, NP  hydrochlorothiazide (HYDRODIURIL) 12.5 MG tablet TAKE 1 TABLET BY MOUTH ONCE DAILY 09/10/17   Mikey College, NP  HYDROcodone-acetaminophen (NORCO/VICODIN) 5-325 MG tablet Take 1-2 tablets by mouth every 6 (six) hours as needed. 09/27/14   [provider]  ibuprofen (ADVIL,MOTRIN) 800 MG tablet Take 1 tablet (800 mg total) by mouth 3 (three) times daily. 09/11/17   Orpah Greek, MD  medroxyPROGESTERone (PROVERA) 5 MG tablet Take 1 tablet (5 mg total) by mouth daily. Take if bleeding becomes heavy again 09/11/17   Pollina, Gwenyth Allegra, MD  nicotine (NICODERM CQ - DOSED IN MG/24 HR) 7 mg/24hr patch Place 1 patch (7 mg total) onto the skin daily. 04/12/17   Mikey College, NP  norethindrone-ethinyl estradiol-iron (LOESTRIN FE 1.5/30) 1.5-30 MG-MCG tablet Take 1 tablet by mouth at bedtime for 28 days. 06/27/17 07/25/17  Mikey College, NP  ondansetron (ZOFRAN ODT) 4 MG disintegrating tablet Allow 1-2 tablets to dissolve in your mouth every 8 hours as needed for nausea/vomiting 11/13/17   Hinda Kehr, MD  ondansetron (ZOFRAN) 4 MG tablet Take 1 tablet (4 mg total) by mouth every 8 (eight) hours as needed for nausea or vomiting. 07/24/17    Schuyler Amor, MD  pantoprazole (PROTONIX) 40 MG tablet TAKE 1 TABLET(40 MG) BY MOUTH TWICE DAILY 10/30/17   Mikey College, NP  Plecanatide (TRULANCE) 3 MG TABS Take 1 tablet by mouth daily. 08/20/17 11/18/17  Jonathon Bellows, MD  promethazine (PHENERGAN) 12.5 MG tablet Take 0.5-1 tablets (6.25-12.5 mg total) by mouth every 6 (six) hours as needed for nausea or vomiting. Patient not taking: Reported on 08/08/2017 06/27/17   Mikey College, NP  sucralfate (CARAFATE) 1 g tablet Take 1 tablet (1 g total) by mouth 4 (four) times daily. Patient not taking: Reported on 08/08/2017 07/04/17   Jonathon Bellows, MD  traMADol (ULTRAM) 50 MG tablet Take 1 tablet (50 mg total) by mouth every 6 (six) hours as needed. 09/11/17   Orpah Greek, MD    Allergies Patient has no known allergies.  Family History  Problem Relation Age of Onset  . Hypertension Mother   . Hypertension Father   . Diabetes Father   . Heart disease Father   .  Hypertension Sister   . Throat cancer Maternal Grandfather   . Stroke Paternal Grandmother   . Diabetes Paternal Grandmother   . Ovarian cancer Neg Hx   . Colon cancer Neg Hx   . Breast cancer Neg Hx     Social History Social History   Tobacco Use  . Smoking status: Current Every Day Smoker    Packs/day: 0.25    Years: 10.00    Pack years: 2.50    Types: Cigarettes  . Smokeless tobacco: Never Used  Substance Use Topics  . Alcohol use: No    Alcohol/week: 0.0 oz  . Drug use: No    Review of Systems Constitutional: No fever/chills Eyes: No visual changes. ENT: No sore throat. Cardiovascular: Denies chest pain. Respiratory: Denies shortness of breath. Gastrointestinal: Abdominal pain with nausea and vomiting as described above Genitourinary: Negative for dysuria. Musculoskeletal: Negative for neck pain.  Negative for back pain. Integumentary: Negative for rash. Neurological: Negative for headaches, focal weakness or  numbness.   ____________________________________________   PHYSICAL EXAM:  VITAL SIGNS: ED Triage Vitals  Enc Vitals Group     BP 11/12/17 2000 (!) 159/92     Pulse Rate 11/12/17 2000 85     Resp 11/12/17 2000 18     Temp 11/12/17 2000 98.2 F (36.8 C)     Temp Source 11/12/17 2000 Oral     SpO2 11/12/17 2000 98 %     Weight 11/12/17 2000 99.8 kg (220 lb)     Height 11/12/17 2000 1.702 m (5\' 7" )     Head Circumference --      Peak Flow --      Pain Score 11/12/17 2005 9     Pain Loc --      Pain Edu? --      Excl. in [place]? --     Constitutional: Alert and oriented. Well appearing and in no acute distress. Eyes: Conjunctivae are normal.  Head: Atraumatic. Nose: No congestion/rhinnorhea. Mouth/Throat: Mucous membranes are moist. Neck: No stridor.  No meningeal signs.   Cardiovascular: Normal rate, regular rhythm. Good peripheral circulation. Grossly normal heart sounds. Respiratory: Normal respiratory effort.  No retractions. Lungs CTAB. Gastrointestinal: Soft, obese, mild to moderate tenderness to palpation throughout with any specific focal tenderness Musculoskeletal: No lower extremity tenderness nor edema. No gross deformities of extremities. Neurologic:  Normal speech and language. No gross focal neurologic deficits are appreciated.  Skin:  Skin is warm, dry and intact. No rash noted. Psychiatric: Mood and affect are normal. Speech and behavior are normal.  ____________________________________________   LABS (all labs ordered are listed, but only abnormal results are displayed)  Labs Reviewed  COMPREHENSIVE METABOLIC PANEL - Abnormal; Notable for the following components:      Result Value   Chloride 100 (*)    Glucose, Bld 125 (*)    Total Protein 8.3 (*)    All other components within normal limits  CBC - Abnormal; Notable for the following components:   WBC 22.2 (*)    RBC 5.44 (*)    Hemoglobin 16.2 (*)    HCT 47.9 (*)    All other components within  normal limits  URINALYSIS, COMPLETE (UACMP) WITH MICROSCOPIC - Abnormal; Notable for the following components:   Color, Urine YELLOW (*)    APPearance HAZY (*)    Hgb urine dipstick MODERATE (*)    Ketones, ur 5 (*)    Bacteria, UA RARE (*)    All other components within normal  limits  LIPASE, BLOOD  POC URINE PREG, ED  POCT PREGNANCY, URINE   ____________________________________________  EKG  None - EKG not ordered by ED physician ____________________________________________  RADIOLOGY  ED MD interpretation: No acute or emergent abnormalities on abdominal series nor ultrasound  Official radiology report(s): Dg Abdomen Acute W/chest  Result Date: 11/13/2017 CLINICAL DATA:  Nausea and vomiting since this morning. Burning in the abdomen. Seen by GI yesterday but missed appointment. Previous history of gastritis. EXAM: DG ABDOMEN ACUTE W/ 1V CHEST COMPARISON:  Chest 07/24/2017.  CT abdomen and pelvis 04/07/2017 FINDINGS: Normal heart size and pulmonary vascularity. No focal airspace disease or consolidation in the lungs. No blunting of costophrenic angles. No pneumothorax. Mediastinal contours appear intact. Scattered gas and stool in the colon. No small or large bowel distention. No free intra-abdominal air. No abnormal air-fluid levels. No radiopaque stones. Visualized bones appear intact. IMPRESSION: No evidence of active pulmonary disease. Normal nonobstructive bowel gas pattern. Electronically Signed   By: Lucienne Capers M.D.   On: 11/13/2017 03:57   US Abdomen Limited Ruq  Result Date: 11/13/2017 CLINICAL DATA:  Leukocytosis. Nonspecific abdominal pain with nausea and vomiting since yesterday. EXAM: ULTRASOUND ABDOMEN LIMITED RIGHT UPPER QUADRANT COMPARISON:  CT abdomen and pelvis 04/07/2017 and ultrasound from 11/26/2016 FINDINGS: Gallbladder: No gallstones or wall thickening visualized. No sonographic Murphy sign noted by sonographer. Common bile duct: Diameter: 3.2 mm Liver: No  focal lesion identified. Within normal limits in parenchymal echogenicity. Portal vein is patent on color Doppler imaging with normal direction of blood flow towards the liver. IMPRESSION: Normal right upper quadrant abdominal ultrasound. Electronically Signed   By: Ashley Royalty M.D.   On: 11/13/2017 02:57    ____________________________________________   PROCEDURES  Critical Care performed: No   Procedure(s) performed:   Procedures   ____________________________________________   INITIAL IMPRESSION / ASSESSMENT AND PLAN / ED COURSE  As part of my medical decision making, I reviewed the following data within the [facility] notes reviewed and incorporated, Labs reviewed , Radiograph reviewed  and Notes from prior ED visits    Differential diagnosis includes, but is not limited to, acute on chronic abdominal pain/cyclic vomiting syndrome/cannabinoid hyperemesis syndrome, SBO/ileus, biliary colic, less likely lower abdominal issue such as appendicitis.  She is in no distress at this time and has not vomited recently.  Given the cyclic nature of her symptoms, I am going to start an IV and order 1 L of normal saline injected with haloperidol 5 mg IV which should be quite successful at aborting the cyclic vomiting process as well as helping to rehydrate her.  I have also ordered Benadryl 12.5 milligrams IV.  Given all the vomiting and her leukocytosis of 22, which I suspect is reactive, I am also checking an acute abdomen series to look for any sign of air-fluid levels.  I also see that her last ultrasound was in 2017 so I have ordered a right upper quadrant ultrasound because she could be suffering from biliary colic and that is important to know.  However the rest of her lab results are reassuring.  She agrees with the plan at this time.  Clinical Course as of Nov 13 452  Wed Nov 13, 2017  0408 Ultrasound and acute abdomen series were normal.  Suspect reactive  leukocytosis.  No additional vomiting.  She has been sleeping comfortably.  I updated her and she agrees with plan for discharge and outpatient follow-up.  I gave my usual and  customary return precautions.    [CF]    Clinical Course User Index [CF] Hinda Kehr, MD    ____________________________________________  FINAL CLINICAL IMPRESSION(S) / ED DIAGNOSES  Final diagnoses:  Chronic abdominal pain  Non-intractable cyclical vomiting with nausea     MEDICATIONS GIVEN DURING THIS VISIT:  Medications  sodium chloride 0.9 % bolus 1,000 mL (0 mLs Intravenous Stopped 11/13/17 0210)  haloperidol lactate (HALDOL) injection 5 mg (5 mg Intravenous Given 11/13/17 0107)  diphenhydrAMINE (BENADRYL) injection 12.5 mg (12.5 mg Intravenous Given 11/13/17 0105)     ED Discharge Orders        Ordered    ondansetron (ZOFRAN ODT) 4 MG disintegrating tablet     11/13/17 0409       Note:  This document was prepared using Dragon voice recognition software and may include unintentional dictation errors.    Hinda Kehr, MD 11/13/17 [phone]

## 2017-11-13 NOTE — Discharge Instructions (Signed)

## 2017-11-13 NOTE — ED Notes (Signed)

## 2017-11-14 ENCOUNTER — Emergency Department
Admission: EM | Admit: 2017-11-14 | Discharge: 2017-11-14 | Disposition: A | Discharge status: Home / Self Care | Payer: BLUE CROSS/BLUE SHIELD | Attending: Emergency Medicine | Admitting: Emergency Medicine

## 2017-11-14 ENCOUNTER — Other Ambulatory Visit: Payer: Self-pay

## 2017-11-14 ENCOUNTER — Encounter: Payer: Self-pay | Admitting: Emergency Medicine

## 2017-11-14 ENCOUNTER — Emergency Department: Payer: BLUE CROSS/BLUE SHIELD

## 2017-11-14 DIAGNOSIS — K449 Diaphragmatic hernia without obstruction or gangrene: Secondary | ICD-10-CM | POA: Insufficient documentation

## 2017-11-14 DIAGNOSIS — R1013 Epigastric pain: Secondary | ICD-10-CM | POA: Diagnosis present

## 2017-11-14 DIAGNOSIS — F17210 Nicotine dependence, cigarettes, uncomplicated: Secondary | ICD-10-CM | POA: Insufficient documentation

## 2017-11-14 DIAGNOSIS — Z79899 Other long term (current) drug therapy: Secondary | ICD-10-CM | POA: Diagnosis not present

## 2017-11-14 DIAGNOSIS — K219 Gastro-esophageal reflux disease without esophagitis: Secondary | ICD-10-CM | POA: Insufficient documentation

## 2017-11-14 DIAGNOSIS — I10 Essential (primary) hypertension: Secondary | ICD-10-CM | POA: Insufficient documentation

## 2017-11-14 LAB — CBC WITH DIFFERENTIAL/PLATELET
BASOS ABS: 0 10*3/uL (ref 0–0.1)
Basophils Relative: 0 %
EOS ABS: 0 10*3/uL (ref 0–0.7)
Eosinophils Relative: 0 %
HCT: 46 % (ref 35.0–47.0)
Hemoglobin: 15.4 g/dL (ref 12.0–16.0)
Lymphocytes Relative: 11 %
Lymphs Abs: 1.6 10*3/uL (ref 1.0–3.6)
MCH: 29.7 pg (ref 26.0–34.0)
MCHC: 33.4 g/dL (ref 32.0–36.0)
MCV: 88.9 fL (ref 80.0–100.0)
MONO ABS: 0.4 10*3/uL (ref 0.2–0.9)
Monocytes Relative: 3 %
Neutro Abs: 12.2 10*3/uL — ABNORMAL HIGH (ref 1.4–6.5)
Neutrophils Relative %: 86 %
PLATELETS: 369 10*3/uL (ref 150–440)
RBC: 5.17 MIL/uL (ref 3.80–5.20)
RDW: 14.1 % (ref 11.5–14.5)
WBC: 14.2 10*3/uL — AB (ref 3.6–11.0)

## 2017-11-14 LAB — COMPREHENSIVE METABOLIC PANEL WITH GFR
ALT: 23 U/L (ref 14–54)
AST: 23 U/L (ref 15–41)
Albumin: 4.3 g/dL (ref 3.5–5.0)
Alkaline Phosphatase: 81 U/L (ref 38–126)
Anion gap: 8 (ref 5–15)
BUN: 14 mg/dL (ref 6–20)
CO2: 28 mmol/L (ref 22–32)
Calcium: 8.8 mg/dL — ABNORMAL LOW (ref 8.9–10.3)
Chloride: 101 mmol/L (ref 101–111)
Creatinine, Ser: 0.76 mg/dL (ref 0.44–1.00)
GFR calc Af Amer: >60 mL/min
GFR calc non Af Amer: >60 mL/min
Glucose, Bld: 133 mg/dL — ABNORMAL HIGH (ref 65–99)
Potassium: 3.7 mmol/L (ref 3.5–5.1)
Sodium: 137 mmol/L (ref 135–145)
Total Bilirubin: 0.6 mg/dL (ref 0.3–1.2)
Total Protein: 7.8 g/dL (ref 6.5–8.1)

## 2017-11-14 LAB — ETHANOL

## 2017-11-14 LAB — URINE DRUG SCREEN, QUALITATIVE (ARMC ONLY)
Amphetamines, Ur Screen: NOT DETECTED
BENZODIAZEPINE, UR SCRN: NOT DETECTED
Barbiturates, Ur Screen: NOT DETECTED
CANNABINOID 50 NG, UR ~~LOC~~: POSITIVE — AB
Cocaine Metabolite,Ur ~~LOC~~: NOT DETECTED
MDMA (ECSTASY) UR SCREEN: NOT DETECTED
Methadone Scn, Ur: NOT DETECTED
Opiate, Ur Screen: NOT DETECTED
PHENCYCLIDINE (PCP) UR S: NOT DETECTED
TRICYCLIC, UR SCREEN: NOT DETECTED

## 2017-11-14 LAB — LIPASE, BLOOD: Lipase: 23 U/L (ref 11–51)

## 2017-11-14 MED ORDER — SODIUM CHLORIDE 0.9 % IV BOLUS
1000.0000 mL | Freq: Once | INTRAVENOUS | Status: AC
  Administered 2017-11-14: 1000 mL via INTRAVENOUS

## 2017-11-14 MED ORDER — DIPHENHYDRAMINE HCL 50 MG/ML IJ SOLN
50.0000 mg | Freq: Once | INTRAMUSCULAR | Status: AC
  Administered 2017-11-14: 50 mg via INTRAVENOUS
  Filled 2017-11-14: qty 1

## 2017-11-14 MED ORDER — HALOPERIDOL LACTATE 5 MG/ML IJ SOLN
2.5000 mg | Freq: Once | INTRAMUSCULAR | Status: AC
  Administered 2017-11-14: 2.5 mg via INTRAVENOUS
  Filled 2017-11-14: qty 1

## 2017-11-14 MED ORDER — PROMETHAZINE HCL 25 MG RE SUPP: 25.0000 mg | Freq: Four times a day (QID) | RECTAL | 0 refills | Status: DC | PRN

## 2017-11-14 NOTE — ED Provider Notes (Signed)
[facility] Emergency Department Provider Note  ____________________________________________  Time seen: Approximately 11:20 AM  I have reviewed the triage vital signs and the nursing notes.   HISTORY  Chief Complaint Hematemesis    HPI Jasmine Mason is a 32 y.o. female with a history of hypertension GERD and hiatal hernia and recurrent chronic abdominal pain and vomiting presents with again recurrent epigastric pain and vomiting since early this morning. She reports being unable to tolerate any oral intake. This feels just like all her previous episodes. The epigastric pain radiates up into the chest and started after vomiting. No shortness of breath. No diaphoresis. No difficulty swallowing. Denies alcohol or marijuana use. Rates the pain is severe, no alleviating factors. Constant.      Past Medical History:  Diagnosis Date  . Back pain   . Back pain   . GERD (gastroesophageal reflux disease)   . Hypertension   . Mesenteric adenitis   . Obesity   . PCOS (polycystic ovarian syndrome)   . Tobacco abuse      Patient Active Problem List   Diagnosis Date Noted  . 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 Encounter for smoking cessation counseling 04/18/2017  . Essential hypertension 04/18/2017  . Epigastric abdominal pain   . Acute gastritis 09/20/2016  . Nausea with vomiting   . Hiatal hernia   . Uncontrollable vomiting 03/10/2016  . Abdominal pain, epigastric   . GERD (gastroesophageal reflux disease) 01/24/2015  . Hypokalemia 01/24/2015  . Abdominal pain 01/24/2015  . Leukocytosis 01/24/2015  . Chest pain 01/24/2015  . Cough 01/24/2015  . Pain of left calf 01/24/2015  . Elevated troponin 01/24/2015  . Obesity   . Mesenteric adenitis   . Tobacco abuse      Past Surgical History:  Procedure Laterality Date  . CARPAL TUNNEL RELEASE Left ~ 2013  . COLONOSCOPY WITH PROPOFOL N/A 08/08/2017   Procedure: COLONOSCOPY WITH PROPOFOL;  Surgeon:  Jonathon Bellows, MD;  Location: [facility] ENDOSCOPY;  Service: Gastroenterology;  Laterality: N/A;  . ESOPHAGOGASTRODUODENOSCOPY N/A 01/25/2015   Procedure: ESOPHAGOGASTRODUODENOSCOPY (EGD);  Surgeon: Irene Shipper, MD;  Location: [facility] ENDOSCOPY;  Service: Endoscopy;  Laterality: N/A;  . ESOPHAGOGASTRODUODENOSCOPY (EGD) WITH PROPOFOL N/A 03/13/2016   Procedure: ESOPHAGOGASTRODUODENOSCOPY (EGD) WITH PROPOFOL;  Surgeon: Lucilla Lame, MD;  Location: [facility] ENDOSCOPY;  Service: Endoscopy;  Laterality: N/A;  . ESOPHAGOGASTRODUODENOSCOPY (EGD) WITH PROPOFOL N/A 07/05/2017   Procedure: ESOPHAGOGASTRODUODENOSCOPY (EGD) WITH PROPOFOL;  Surgeon: Jonathon Bellows, MD;  Location: [facility] ENDOSCOPY;  Service: Gastroenterology;  Laterality: N/A;  . FRACTURE SURGERY    . ORIF ANKLE FRACTURE Left 1990's     Prior to Admission medications   Medication Sig Start Date End Date Taking? Authorizing Provider  Adapalene 0.3 % gel APPLY A THIN LAYER TO FACE QHS AND [place] OFF QAM 05/29/17   [provider]  alum & mag hydroxide-simeth (MAALOX MAX) 630-160-10 MG/5ML suspension Take 5 mLs by mouth every 6 (six) hours as needed for indigestion.    [provider]  cyclobenzaprine (FLEXERIL) 5 MG tablet Take 1 tablet (5 mg total) by mouth 3 (three) times daily as needed for muscle spasms. 10/22/17   Menshew, Dannielle Karvonen, PA-C  dicyclomine (BENTYL) 10 MG capsule Take 1 capsule (10 mg total) by mouth 4 (four) times daily -  before meals and at bedtime. 08/05/17   Jonathon Bellows, MD  docusate sodium (COLACE) 100 MG capsule Take 1 capsule (100 mg total) by mouth 2 (two) times daily. 06/27/17  Mikey College, NP  doxycycline (VIBRAMYCIN) 50 MG capsule Take 1 capsule by mouth daily. 05/27/17   [provider]  fluticasone (FLONASE) 50 MCG/ACT nasal spray Place 2 sprays into both nostrils daily. 06/27/17   Mikey College, NP  hydrochlorothiazide (HYDRODIURIL) 12.5 MG tablet TAKE 1 TABLET BY MOUTH ONCE DAILY 09/10/17    Mikey College, NP  HYDROcodone-acetaminophen (NORCO/VICODIN) 5-325 MG tablet Take 1-2 tablets by mouth every 6 (six) hours as needed. 09/27/14   [provider]  ibuprofen (ADVIL,MOTRIN) 800 MG tablet Take 1 tablet (800 mg total) by mouth 3 (three) times daily. 09/11/17   Orpah Greek, MD  medroxyPROGESTERone (PROVERA) 5 MG tablet Take 1 tablet (5 mg total) by mouth daily. Take if bleeding becomes heavy again 09/11/17   Pollina, Gwenyth Allegra, MD  nicotine (NICODERM CQ - DOSED IN MG/24 HR) 7 mg/24hr patch Place 1 patch (7 mg total) onto the skin daily. 04/12/17   Mikey College, NP  norethindrone-ethinyl estradiol-iron (LOESTRIN FE 1.5/30) 1.5-30 MG-MCG tablet Take 1 tablet by mouth at bedtime for 28 days. 06/27/17 07/25/17  Mikey College, NP  ondansetron (ZOFRAN) 4 MG tablet Take 1 tablet (4 mg total) by mouth every 8 (eight) hours as needed for nausea or vomiting. Patient not taking: Reported on 11/14/2017 07/24/17   Schuyler Amor, MD  pantoprazole (PROTONIX) 40 MG tablet TAKE 1 TABLET(40 MG) BY MOUTH TWICE DAILY 10/30/17   Mikey College, NP  Plecanatide (TRULANCE) 3 MG TABS Take 1 tablet by mouth daily. 08/20/17 11/18/17  Jonathon Bellows, MD  promethazine (PHENERGAN) 25 MG suppository Place 1 suppository (25 mg total) rectally every 6 (six) hours as needed for nausea. 11/14/17   Carrie Mew, MD  sucralfate (CARAFATE) 1 g tablet Take 1 tablet (1 g total) by mouth 4 (four) times daily. Patient not taking: Reported on 08/08/2017 07/04/17   Jonathon Bellows, MD  traMADol (ULTRAM) 50 MG tablet Take 1 tablet (50 mg total) by mouth every 6 (six) hours as needed. Patient not taking: Reported on 11/14/2017 09/11/17   Orpah Greek, MD     Allergies Patient has no known allergies.   Family History  Problem Relation Age of Onset  . Hypertension Mother   . Hypertension Father   . Diabetes Father   . Heart disease Father   . Hypertension Sister   . Throat  cancer Maternal Grandfather   . Stroke Paternal Grandmother   . Diabetes Paternal Grandmother   . Ovarian cancer Neg Hx   . Colon cancer Neg Hx   . Breast cancer Neg Hx     Social History Social History   Tobacco Use  . Smoking status: Current Every Day Smoker    Packs/day: 0.25    Years: 10.00    Pack years: 2.50    Types: Cigarettes  . Smokeless tobacco: Never Used  Substance Use Topics  . Alcohol use: No    Alcohol/week: 0.0 oz  . Drug use: No    Review of Systems  Constitutional:   No fever or chills.  ENT:   No sore throat. No rhinorrhea. Cardiovascular:   No chest pain or syncope. Respiratory:   No dyspnea or cough. Gastrointestinal:   positive as above for epigastric pain and vomiting..  Musculoskeletal:   Negative for focal pain or swelling All other systems reviewed and are negative except as documented above in ROS and HPI.  ____________________________________________   PHYSICAL EXAM:  VITAL SIGNS: ED Triage Vitals  Enc Vitals Group     BP 11/14/17 1055 (!) 160/94     Pulse Rate 11/14/17 1047 72     Resp 11/14/17 1047 18     Temp 11/14/17 1047 98.1 F (36.7 C)     Temp Source 11/14/17 1047 Oral     SpO2 11/14/17 1047 100 %     Weight 11/14/17 1052 220 lb (99.8 kg)     Height 11/14/17 1052 5\' 7"  (1.702 m)     Head Circumference --      Peak Flow --      Pain Score 11/14/17 1052 8     Pain Loc --      Pain Edu? --      Excl. in [place]? --     Vital signs reviewed, nursing assessments reviewed.   Constitutional:   Alert and oriented. Well appearing and in no distress. Eyes:   Conjunctivae are normal. EOMI. PERRL. ENT      Head:   Normocephalic and atraumatic.      Nose:   No congestion/rhinnorhea.       Mouth/Throat:   MMM, no pharyngeal erythema. No peritonsillar mass.       Neck:   No meningismus. Full ROM.no crepitus Hematological/Lymphatic/Immunilogical:   No cervical lymphadenopathy. Cardiovascular:   RRR. Symmetric bilateral radial and  DP pulses.  No murmurs.  Respiratory:   Normal respiratory effort without tachypnea/retractions. Breath sounds are clear and equal bilaterally. No wheezes/rales/rhonchi. Gastrointestinal:   Soft with left upper quadrant and epigastric tenderness. Non distended. There is no CVA tenderness.  No rebound, rigidity, or guarding.rectal exam performed with nurse Jordyn at bedside, reveals brown stool, Hemoccult negative, controls okay  Musculoskeletal:   Normal range of motion in all extremities. No joint effusions.  No lower extremity tenderness.  No edema. Neurologic:   Normal speech and language.  Motor grossly intact. No acute focal neurologic deficits are appreciated.  Skin:    Skin is warm, dry and intact. No rash noted.  No petechiae, purpura, or bullae.  ____________________________________________    LABS (pertinent positives/negatives) (all labs ordered are listed, but only abnormal results are displayed) Labs Reviewed  COMPREHENSIVE METABOLIC PANEL - Abnormal; Notable for the following components:      Result Value   Glucose, Bld 133 (*)    Calcium 8.8 (*)    All other components within normal limits  CBC WITH DIFFERENTIAL/PLATELET - Abnormal; Notable for the following components:   WBC 14.2 (*)    Neutro Abs 12.2 (*)    All other components within normal limits  LIPASE, BLOOD  ETHANOL  URINE DRUG SCREEN, QUALITATIVE ([facility] ONLY)   ____________________________________________   EKG  interpreted by me  Date: 11/14/2017  Rate: 67  Rhythm: normal sinus rhythm  QRS Axis: normal  Intervals: normal  ST/T Wave abnormalities: normal  Conduction Disutrbances: none  Narrative Interpretation: unremarkable      ____________________________________________    RADIOLOGY  Ct Chest Wo Contrast  Result Date: 11/14/2017 CLINICAL DATA:  Chest pain and shortness of breath. EXAM: CT CHEST WITHOUT CONTRAST TECHNIQUE: Multidetector CT imaging of the chest was performed following  the standard protocol without IV contrast. COMPARISON:  Chest x-ray from yesterday. FINDINGS: Cardiovascular: No significant vascular findings. Normal heart size. No pericardial effusion. Normal caliber thoracic aorta. Mediastinum/Nodes: No enlarged mediastinal or axillary lymph nodes. Thyroid gland, trachea, and esophagus demonstrate no significant findings. Lungs/Pleura: Lungs are clear. No pleural effusion or pneumothorax. No suspicious pulmonary nodule. Upper Abdomen: No acute  abnormality. Musculoskeletal: No acute or significant osseous findings. IMPRESSION: 1. Normal noncontrast chest CT.  No acute intrathoracic process. Electronically Signed   By: Titus Dubin M.D.   On: 11/14/2017 11:22    ____________________________________________   PROCEDURES Procedures  ____________________________________________  DIFFERENTIAL DIAGNOSIS   GERD, gastritis, symptomatic hiatal hernia, pancreatitis, esophageal perforation  CLINICAL IMPRESSION / ASSESSMENT AND PLAN / ED COURSE  Pertinent labs & imaging results that were available during my care of the patient were reviewed by me and considered in my medical decision making (see chart for details).    patient well appearing no acute distress, unremarkable vital signs, presents with recurrent abdominal pain and vomiting similar to multiple prior episodes. Reviewed electronic medical record, she's had multiple CT scans of the abdomen which is been reassuring. She had upper endoscopy in December 2018 which revealed hiatal hernia. Pathology specimens from that time were unremarkable.  Considering the patient's symptoms, medical history, and physical examination today, I have low suspicion for cholecystitis or biliary pathology, pancreatitis, perforation or bowel obstruction, hernia, intra-abdominal abscess, AAA or dissection, volvulus or intussusception, mesenteric ischemia, or appendicitis.I doubt ACS PE or dissection. no evidence of GI bleed at this  time  I will obtain a noncontrast CT scan of her chest to evaluate for esophageal injury. Check labs. IV fluids Haldol Benadryl for symptom relief.  Clinical Course as of Nov 14 1348  Thu Nov 14, 2017  1127 CT chest shows no acute disease. No pneumomediastinum. No evidence of intra-abdominal free air.   [PS]  1345 Labs unremarkable. Wbc slightly elevated, I think due to stress of pain and vomiting. No reason to suspect underlying infectious or inflammatory process at this point. I doubt pancreatitis or biliary disease. No evidence of bowel perforation or obstruction.    [PS]    Clinical Course User Index [PS] Carrie Mew, MD     ----------------------------------------- 1:50 PM on 11/14/2017 -----------------------------------------  After antiemetics patient has been calm, comfortable, awake alert and ambulatory. Labs are reassuring. CT chest is unremarkable. Plan to discharge home to follow up with her gastroenterologist. Her hiatal hernia may be symptomatic, but I don't suspect any complications evident at this time such as incarceration or gangrene.I think it is contributing to pronounced GERD symptoms.  ____________________________________________   FINAL CLINICAL IMPRESSION(S) / ED DIAGNOSES    Final diagnoses:  Gastroesophageal reflux disease, esophagitis presence not specified  Hiatal hernia     ED Discharge Orders        Ordered    promethazine (PHENERGAN) 25 MG suppository  Every 6 hours PRN     11/14/17 1349      Portions of this note were generated with dragon dictation software. Dictation errors may occur despite best attempts at proofreading.    Carrie Mew, MD 11/14/17 1351

## 2017-11-14 NOTE — Discharge Instructions (Signed)
Your labs and CT scan of the chest today were unremarkable. Follow up with your GI doctor as soon as possible for continued evaluation of your symptoms.

## 2017-11-14 NOTE — ED Notes (Signed)
Pt found to be off monitor, did not call out for help to go to bathroom, pt unhooked herself and walked to bathroom. Pt seen moving her head of bed up and down for comfort herself. No Emesis seen.

## 2017-11-14 NOTE — ED Triage Notes (Signed)
Pt arrives to ED via [facility] with complaints of blood in her emesis. Pt was seen here a few days ago. She was suppose to follow up with GI but missed her appointment.

## 2017-11-22 ENCOUNTER — Telehealth: Payer: Self-pay

## 2017-11-22 NOTE — Telephone Encounter (Signed)
-----   Message from Jonathon Bellows, MD sent at 11/17/2017 11:42 AM EDT ----- Regarding: FW: clinic follow up [facility] thanks for the update.   Alaina Donati can you have her come in to see me in 2-3 weeks please   Kiran  ----- Message ----- From: Hinda Kehr, MD Sent: 11/13/2017   4:10 AM To: Jonathon Bellows, MD Subject: clinic follow up                               [place],  This patient has had numerous emergency department visits over time what appears to be cyclic vomiting syndrome versus cannabinoid hyperemesis syndrome.  I believe she has had endoscopies and colonoscopies.  She is 1 of your patients and she tells me she was supposed to go to clinic yesterday but did not know the time of the appointment so she missed her appointment.  Her evaluation was generally reassuring in the emergency department tonight with what I suspect is a reactive leukocytosis but no other acute or emergent findings.  I administered haloperidol and Benadryl to help with the cyclic vomiting and abdominal pain and it seemed to greatly relieve her symptoms.  I told her she should contact your office to schedule a follow-up appointment, but hopefully your office can reach out to her as well.  Thank you for your assistance!  Sincerely,    - Hinda Kehr

## 2017-11-22 NOTE — Telephone Encounter (Signed)
Dr. Vicente Males received a message from ER Doc. Requesting appointment in 2 - 3 weeks.   LVM for patient callback to schedule.

## 2017-11-26 ENCOUNTER — Ambulatory Visit: Payer: Self-pay | Admitting: Gastroenterology

## 2017-11-27 ENCOUNTER — Ambulatory Visit: Payer: Self-pay | Admitting: Gastroenterology

## 2017-11-27 ENCOUNTER — Encounter: Payer: Self-pay | Admitting: Gastroenterology

## 2018-01-09 ENCOUNTER — Other Ambulatory Visit: Payer: Self-pay

## 2018-01-09 ENCOUNTER — Emergency Department: Payer: Self-pay

## 2018-01-09 ENCOUNTER — Emergency Department
Admission: EM | Admit: 2018-01-09 | Discharge: 2018-01-09 | Disposition: A | Discharge status: Home / Self Care | Payer: Self-pay | Attending: Student in an Organized Health Care Education/Training Program | Admitting: Student in an Organized Health Care Education/Training Program

## 2018-01-09 DIAGNOSIS — Z79899 Other long term (current) drug therapy: Secondary | ICD-10-CM | POA: Insufficient documentation

## 2018-01-09 DIAGNOSIS — F17210 Nicotine dependence, cigarettes, uncomplicated: Secondary | ICD-10-CM | POA: Insufficient documentation

## 2018-01-09 DIAGNOSIS — R1011 Right upper quadrant pain: Secondary | ICD-10-CM | POA: Insufficient documentation

## 2018-01-09 DIAGNOSIS — I10 Essential (primary) hypertension: Secondary | ICD-10-CM | POA: Insufficient documentation

## 2018-01-09 DIAGNOSIS — R112 Nausea with vomiting, unspecified: Secondary | ICD-10-CM | POA: Insufficient documentation

## 2018-01-09 LAB — HCG, QUANTITATIVE, PREGNANCY: hCG, Beta Chain, Quant, S: <1 m[IU]/mL (ref <5)

## 2018-01-09 LAB — TROPONIN I: Troponin I: <0.03 ng/mL (ref <0.03)

## 2018-01-09 LAB — CBC
HCT: 46.2 % (ref 35.0–47.0)
Hemoglobin: 15.5 g/dL (ref 12.0–16.0)
MCH: 29.8 pg (ref 26.0–34.0)
MCHC: 33.6 g/dL (ref 32.0–36.0)
MCV: 88.6 fL (ref 80.0–100.0)
PLATELETS: 336 10*3/uL (ref 150–440)
RBC: 5.21 MIL/uL — AB (ref 3.80–5.20)
RDW: 13.8 % (ref 11.5–14.5)
WBC: 17.9 10*3/uL — AB (ref 3.6–11.0)

## 2018-01-09 LAB — BASIC METABOLIC PANEL
Anion gap: 12 (ref 5–15)
BUN: 16 mg/dL (ref 6–20)
CALCIUM: 8.7 mg/dL — AB (ref 8.9–10.3)
CO2: 24 mmol/L (ref 22–32)
CREATININE: 0.59 mg/dL (ref 0.44–1.00)
Chloride: 103 mmol/L (ref 101–111)
GFR calc non Af Amer: >60 mL/min (ref >60)
Glucose, Bld: 135 mg/dL — ABNORMAL HIGH (ref 65–99)
Potassium: 2.9 mmol/L — ABNORMAL LOW (ref 3.5–5.1)
SODIUM: 139 mmol/L (ref 135–145)

## 2018-01-09 LAB — HEPATIC FUNCTION PANEL
ALK PHOS: 76 U/L (ref 38–126)
ALT: 23 U/L (ref 14–54)
AST: 22 U/L (ref 15–41)
Albumin: 4.4 g/dL (ref 3.5–5.0)
BILIRUBIN DIRECT: 0.2 mg/dL (ref 0.1–0.5)
BILIRUBIN INDIRECT: 1.2 mg/dL — AB (ref 0.3–0.9)
BILIRUBIN TOTAL: 1.4 mg/dL — AB (ref 0.3–1.2)
TOTAL PROTEIN: 7.9 g/dL (ref 6.5–8.1)

## 2018-01-09 LAB — LIPASE, BLOOD: Lipase: 21 U/L (ref 11–51)

## 2018-01-09 MED ORDER — ONDANSETRON 4 MG PO TBDP
ORAL_TABLET | ORAL | Status: AC
  Filled 2018-01-09: qty 1

## 2018-01-09 MED ORDER — SODIUM CHLORIDE 0.9 % IV BOLUS
500.0000 mL | Freq: Once | INTRAVENOUS | Status: AC
  Administered 2018-01-09: 500 mL via INTRAVENOUS

## 2018-01-09 MED ORDER — PROMETHAZINE HCL 25 MG/ML IJ SOLN
12.5000 mg | Freq: Four times a day (QID) | INTRAMUSCULAR | Status: DC | PRN
  Administered 2018-01-09: 12.5 mg via INTRAVENOUS
  Filled 2018-01-09: qty 1

## 2018-01-09 MED ORDER — HYDROCHLOROTHIAZIDE 25 MG PO TABS
12.5000 mg | ORAL_TABLET | Freq: Once | ORAL | Status: DC
Start: 2018-01-09 — End: 2018-01-09
  Filled 2018-01-09: qty 0.5

## 2018-01-09 MED ORDER — ONDANSETRON 4 MG PO TBDP
4.0000 mg | ORAL_TABLET | Freq: Once | ORAL | Status: AC
  Administered 2018-01-09: 4 mg via ORAL

## 2018-01-09 MED ORDER — MORPHINE SULFATE (PF) 4 MG/ML IV SOLN
INTRAVENOUS | Status: AC
  Filled 2018-01-09: qty 1

## 2018-01-09 MED ORDER — GI COCKTAIL ~~LOC~~
30.0000 mL | Freq: Once | ORAL | Status: AC
  Administered 2018-01-09: 30 mL via ORAL
  Filled 2018-01-09: qty 30

## 2018-01-09 MED ORDER — PROMETHAZINE HCL 12.5 MG PO TABS: 12.5000 mg | ORAL_TABLET | Freq: Four times a day (QID) | ORAL | 0 refills | Status: DC | PRN

## 2018-01-09 MED ORDER — PANTOPRAZOLE SODIUM 40 MG PO TBEC
40.0000 mg | DELAYED_RELEASE_TABLET | Freq: Once | ORAL | Status: AC
  Administered 2018-01-09: 40 mg via ORAL
  Filled 2018-01-09: qty 1

## 2018-01-09 MED ORDER — MORPHINE SULFATE (PF) 4 MG/ML IV SOLN
4.0000 mg | INTRAVENOUS | Status: DC | PRN
  Administered 2018-01-09: 4 mg via INTRAVENOUS

## 2018-01-09 NOTE — Discharge Instructions (Addendum)

## 2018-01-09 NOTE — ED Provider Notes (Signed)
[facility] Emergency Department Provider Note    First MD Initiated Contact with Patient 01/09/18 1448     (approximate)  I have reviewed the triage vital signs and the nursing notes.   HISTORY  Chief Complaint Chest Pain    HPI Jasmine Mason is a 32 y.o. female a history of GERD as well as PCOS obesity and tobacco use presents to the ER with 2 days of nausea vomiting and epigastric and  Intermittent mild to moderate right upper quadrant pain.  States that after multiple episodes of nonbilious nonbloody vomiting she started developing chest pain that she feels is consistent with her previous flares of gastritis.  Has been feeling chills.  Denies any cough or shortness of breath.  Patient actively vomiting just prior to evaluation in the ER room.  Denies any diarrhea.  Denies any lower abdominal pain.  No flank pain.    Past Medical History:  Diagnosis Date  . Back pain   . Back pain   . GERD (gastroesophageal reflux disease)   . Hypertension   . Mesenteric adenitis   . Obesity   . PCOS (polycystic ovarian syndrome)   . Tobacco abuse    Family History  Problem Relation Age of Onset  . Hypertension Mother   . Hypertension Father   . Diabetes Father   . Heart disease Father   . Hypertension Sister   . Throat cancer Maternal Grandfather   . Stroke Paternal Grandmother   . Diabetes Paternal Grandmother   . Ovarian cancer Neg Hx   . Colon cancer Neg Hx   . Breast cancer Neg Hx    Past Surgical History:  Procedure Laterality Date  . CARPAL TUNNEL RELEASE Left ~ 2013  . COLONOSCOPY WITH PROPOFOL N/A 08/08/2017   Procedure: COLONOSCOPY WITH PROPOFOL;  Surgeon: Jonathon Bellows, MD;  Location: [facility] ENDOSCOPY;  Service: Gastroenterology;  Laterality: N/A;  . ESOPHAGOGASTRODUODENOSCOPY N/A 01/25/2015   Procedure: ESOPHAGOGASTRODUODENOSCOPY (EGD);  Surgeon: Irene Shipper, MD;  Location[facility] ENDOSCOPY;  Service: Endoscopy;  Laterality: N/A;  .  ESOPHAGOGASTRODUODENOSCOPY (EGD) WITH PROPOFOL N/A 03/13/2016   Procedure: ESOPHAGOGASTRODUODENOSCOPY (EGD) WITH PROPOFOL;  Surgeon: Lucilla Lame, MD;  Location: [facility] ENDOSCOPY;  Service: Endoscopy;  Laterality: N/A;  . ESOPHAGOGASTRODUODENOSCOPY (EGD) WITH PROPOFOL N/A 07/05/2017   Procedure: ESOPHAGOGASTRODUODENOSCOPY (EGD) WITH PROPOFOL;  Surgeon: Jonathon Bellows, MD;  Location: [facility] ENDOSCOPY;  Service: Gastroenterology;  Laterality: N/A;  . FRACTURE SURGERY    . ORIF ANKLE FRACTURE Left 1990's   Patient Active Problem List   Diagnosis Date Noted  . 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 Encounter for smoking cessation counseling 04/18/2017  . Essential hypertension 04/18/2017  . Epigastric abdominal pain   . Acute gastritis 09/20/2016  . Nausea with vomiting   . Hiatal hernia   . Uncontrollable vomiting 03/10/2016  . Abdominal pain, epigastric   . GERD (gastroesophageal reflux disease) 01/24/2015  . Hypokalemia 01/24/2015  . Abdominal pain 01/24/2015  . Leukocytosis 01/24/2015  . Chest pain 01/24/2015  . Cough 01/24/2015  . Pain of left calf 01/24/2015  . Elevated troponin 01/24/2015  . Obesity   . Mesenteric adenitis   . Tobacco abuse       Prior to Admission medications   Medication Sig Start Date End Date Taking? Authorizing Provider  Adapalene 0.3 % gel APPLY A THIN LAYER TO FACE QHS AND [place] OFF QAM 05/29/17   [provider]  alum & mag hydroxide-simeth (MAALOX MAX) 400-400-40 MG/5ML suspension Take 5 mLs  by mouth every 6 (six) hours as needed for indigestion.    [provider]  cyclobenzaprine (FLEXERIL) 5 MG tablet Take 1 tablet (5 mg total) by mouth 3 (three) times daily as needed for muscle spasms. 10/22/17   Menshew, Dannielle Karvonen, PA-C  dicyclomine (BENTYL) 10 MG capsule Take 1 capsule (10 mg total) by mouth 4 (four) times daily -  before meals and at bedtime. 08/05/17   Jonathon Bellows, MD  docusate sodium (COLACE) 100 MG capsule Take 1 capsule (100 mg  total) by mouth 2 (two) times daily. 06/27/17   Mikey College, NP  doxycycline (VIBRAMYCIN) 50 MG capsule Take 1 capsule by mouth daily. 05/27/17   [provider]  fluticasone (FLONASE) 50 MCG/ACT nasal spray Place 2 sprays into both nostrils daily. 06/27/17   Mikey College, NP  hydrochlorothiazide (HYDRODIURIL) 12.5 MG tablet TAKE 1 TABLET BY MOUTH ONCE DAILY 09/10/17   Mikey College, NP  HYDROcodone-acetaminophen (NORCO/VICODIN) 5-325 MG tablet Take 1-2 tablets by mouth every 6 (six) hours as needed. 09/27/14   [provider]  ibuprofen (ADVIL,MOTRIN) 800 MG tablet Take 1 tablet (800 mg total) by mouth 3 (three) times daily. 09/11/17   Orpah Greek, MD  medroxyPROGESTERone (PROVERA) 5 MG tablet Take 1 tablet (5 mg total) by mouth daily. Take if bleeding becomes heavy again 09/11/17   Pollina, Gwenyth Allegra, MD  nicotine (NICODERM CQ - DOSED IN MG/24 HR) 7 mg/24hr patch Place 1 patch (7 mg total) onto the skin daily. 04/12/17   Mikey College, NP  norethindrone-ethinyl estradiol-iron (LOESTRIN FE 1.5/30) 1.5-30 MG-MCG tablet Take 1 tablet by mouth at bedtime for 28 days. 06/27/17 07/25/17  Mikey College, NP  ondansetron (ZOFRAN) 4 MG tablet Take 1 tablet (4 mg total) by mouth every 8 (eight) hours as needed for nausea or vomiting. Patient not taking: Reported on 11/14/2017 07/24/17   Schuyler Amor, MD  pantoprazole (PROTONIX) 40 MG tablet TAKE 1 TABLET(40 MG) BY MOUTH TWICE DAILY 10/30/17   Mikey College, NP  promethazine (PHENERGAN) 25 MG suppository Place 1 suppository (25 mg total) rectally every 6 (six) hours as needed for nausea. 11/14/17   Carrie Mew, MD  sucralfate (CARAFATE) 1 g tablet Take 1 tablet (1 g total) by mouth 4 (four) times daily. Patient not taking: Reported on 08/08/2017 07/04/17   Jonathon Bellows, MD  traMADol (ULTRAM) 50 MG tablet Take 1 tablet (50 mg total) by mouth every 6 (six) hours as needed. Patient not  taking: Reported on 11/14/2017 09/11/17   Orpah Greek, MD    Allergies Patient has no known allergies.    Social History Social History   Tobacco Use  . Smoking status: Current Every Day Smoker    Packs/day: 0.25    Years: 10.00    Pack years: 2.50    Types: Cigarettes  . Smokeless tobacco: Never Used  Substance Use Topics  . Alcohol use: No    Alcohol/week: 0.0 oz  . Drug use: No    Review of Systems Patient denies headaches, rhinorrhea, blurry vision, numbness, shortness of breath, chest pain, edema, cough, abdominal pain, nausea, vomiting, diarrhea, dysuria, fevers, rashes or hallucinations unless otherwise stated above in HPI. ____________________________________________   PHYSICAL EXAM:  VITAL SIGNS: Vitals:   01/09/18 1410  BP: (!) 178/139  Pulse: 87  Resp: 20  Temp: 98.4 F (36.9 C)  SpO2: 99%    Constitutional: Alert and oriented.  Eyes: Conjunctivae are normal.  Head: Atraumatic. Nose: No congestion/rhinnorhea. Mouth/Throat: Mucous membranes are moist.   Neck: No stridor. Painless ROM.  Cardiovascular: Normal rate, regular rhythm. Grossly normal heart sounds.  Good peripheral circulation. Respiratory: Normal respiratory effort.  No retractions. Lungs CTAB. Gastrointestinal: Soft , morbidly obese with TTP in RUQ.  No distention. No abdominal bruits. No CVA tenderness. Genitourinary: deferred Musculoskeletal: No lower extremity tenderness nor edema.  No joint effusions. Neurologic:  Normal speech and language. No gross focal neurologic deficits are appreciated. No facial droop Skin:  Skin is warm, dry and intact. No rash noted. Psychiatric: Mood and affect are normal. Speech and behavior are normal.  ____________________________________________   LABS (all labs ordered are listed, but only abnormal results are displayed)  Results for orders placed or performed during the hospital encounter of 01/09/18 (from the past 24 hour(s))  Basic  metabolic panel     Status: Abnormal   Collection Time: 01/09/18  2:23 PM  Result Value Ref Range   Sodium 139 135 - 145 mmol/L   Potassium 2.9 (L) 3.5 - 5.1 mmol/L   Chloride 103 101 - 111 mmol/L   CO2 24 22 - 32 mmol/L   Glucose, Bld 135 (H) 65 - 99 mg/dL   BUN 16 6 - 20 mg/dL   Creatinine, Ser 0.59 0.44 - 1.00 mg/dL   Calcium 8.7 (L) 8.9 - 10.3 mg/dL   GFR calc non Af Amer >60 >60 mL/min   GFR calc Af Amer >60 >60 mL/min   Anion gap 12 5 - 15  CBC     Status: Abnormal   Collection Time: 01/09/18  2:23 PM  Result Value Ref Range   WBC 17.9 (H) 3.6 - 11.0 K/uL   RBC 5.21 (H) 3.80 - 5.20 MIL/uL   Hemoglobin 15.5 12.0 - 16.0 g/dL   HCT 46.2 35.0 - 47.0 %   MCV 88.6 80.0 - 100.0 fL   MCH 29.8 26.0 - 34.0 pg   MCHC 33.6 32.0 - 36.0 g/dL   RDW 13.8 11.5 - 14.5 %   Platelets 336 150 - 440 K/uL  Troponin I     Status: None   Collection Time: 01/09/18  2:23 PM  Result Value Ref Range   Troponin I <0.03 <0.03 ng/mL  Hepatic function panel     Status: Abnormal   Collection Time: 01/09/18  2:23 PM  Result Value Ref Range   Total Protein 7.9 6.5 - 8.1 g/dL   Albumin 4.4 3.5 - 5.0 g/dL   AST 22 15 - 41 U/L   ALT 23 14 - 54 U/L   Alkaline Phosphatase 76 38 - 126 U/L   Total Bilirubin 1.4 (H) 0.3 - 1.2 mg/dL   Bilirubin, Direct 0.2 0.1 - 0.5 mg/dL   Indirect Bilirubin 1.2 (H) 0.3 - 0.9 mg/dL  Lipase, blood     Status: None   Collection Time: 01/09/18  2:23 PM  Result Value Ref Range   Lipase 21 11 - 51 U/L  hCG, quantitative, pregnancy     Status: None   Collection Time: 01/09/18  2:23 PM  Result Value Ref Range   hCG, Beta Chain, Quant, S <1 <5 mIU/mL   ____________________________________________  EKG My review and personal interpretation at Time: 14:17   Indication: chestpain  Rate: 80  Rhythm: sinus Axis: normal Other: normal intervals, isolated t wave inversion in III not present on previous but no ST  deviation ____________________________________________  RADIOLOGY  I personally reviewed all radiographic images ordered to evaluate  for the above acute complaints and reviewed radiology reports and findings.  These findings were personally discussed with the patient.  Please see medical record for radiology report.  ____________________________________________   PROCEDURES  Procedure(s) performed:  Procedures    Critical Care performed: no ____________________________________________   INITIAL IMPRESSION / ASSESSMENT AND PLAN / ED COURSE  Pertinent labs & imaging results that were available during my care of the patient were reviewed by me and considered in my medical decision making (see chart for details).   DDX: cholelithiasis, pancreatitis, cholecystitis esophagitis,  pna, bronchitis, costochondritis, acs   Jasmine Mason is a 32 y.o. who presents to the ED with symptoms as described above.  She is afebrile not tachycardic but is hypertensive and actively vomiting.  Is on well-appearing but protecting her airway.  Does have right upper quadrant tenderness palpation.  Patient has blood work showing evidence of acute leukocytosis however on review of her previous labs she seems to always carry a bit of a leukocytosis.  Based on her symptomatology will order right upper quadrant ultrasound to concern for biliary pathology.  Possible pancreatitis.  Will provide IV fluids as well as IV pain and IV antiemetic medication and reassess.  Clinical Course as of Jan 10 1847  Thu Jan 09, 2018  1639 Repeat abdominal exam is soft and benign.  Patient states that her symptoms are much improved after IV antiemetics and IV pain medication.  Ultrasound shows no evidence of cholecystitis or cholelithiasis at this time.  No evidence of pancreatitis.  Biliary function is otherwise normal.  Mildly elevated T bili might be secondary to dehydration and nausea and vomiting.  Certainly no evidence of  gallstone pancreatitis or hepatitis.   [PR]  [id number] Patient's pregnancy level finally returned normal.  She is tolerating oral hydration.  Repeat abdominal exam soft and benign.  At this point do believe she stable and appropriate for outpatient follow-up.  Patient was able to tolerate PO and was able to ambulate with a steady gait.  Have discussed with the patient and available family all diagnostics and treatments performed thus far and all questions were answered to the best of my ability. The patient demonstrates understanding and agreement with plan.       [PR]    Clinical Course User Index [PR] Merlyn Lot, MD     As part of my medical decision making, I reviewed the following data within the [place] notes reviewed and incorporated, Labs reviewed, notes from prior ED visits.   ____________________________________________   FINAL CLINICAL IMPRESSION(S) / ED DIAGNOSES  Final diagnoses:  Abdominal pain, RUQ  Non-intractable vomiting with nausea, unspecified vomiting type      NEW MEDICATIONS STARTED DURING THIS VISIT:  New Prescriptions   No medications on file     Note:  This document was prepared using Dragon voice recognition software and may include unintentional dictation errors.    Merlyn Lot, MD 01/09/18 Valerie Roys

## 2018-01-09 NOTE — ED Notes (Signed)
Taking PO well.  Tolerating saltine crackers and water.  No nausea/ vomiting.

## 2018-01-09 NOTE — ED Triage Notes (Signed)
Pt c/o tightness in her chest with SOB and N/V for the past 2 days. Denies any recent cough or congestion. Pt is in NAD at present.

## 2018-01-09 NOTE — ED Notes (Signed)
IV repositioned so fluids could flow. Pt resting quietly.

## 2018-01-09 NOTE — ED Notes (Signed)
Pt ambulatory to POV without difficulty. VSS. NAD. Discharge instructions, RX and follow up reviewed. All questions and concerns voiced.

## 2018-01-15 ENCOUNTER — Other Ambulatory Visit: Payer: Self-pay | Admitting: Nurse Practitioner

## 2018-01-15 DIAGNOSIS — K219 Gastro-esophageal reflux disease without esophagitis: Secondary | ICD-10-CM

## 2018-01-21 ENCOUNTER — Encounter: Payer: Self-pay | Admitting: Emergency Medicine

## 2018-01-21 ENCOUNTER — Emergency Department
Admission: EM | Admit: 2018-01-21 | Discharge: 2018-01-21 | Disposition: A | Discharge status: Home / Self Care | Payer: Self-pay | Attending: Emergency Medicine | Admitting: Emergency Medicine

## 2018-01-21 DIAGNOSIS — I10 Essential (primary) hypertension: Secondary | ICD-10-CM | POA: Insufficient documentation

## 2018-01-21 DIAGNOSIS — F17210 Nicotine dependence, cigarettes, uncomplicated: Secondary | ICD-10-CM | POA: Insufficient documentation

## 2018-01-21 DIAGNOSIS — K047 Periapical abscess without sinus: Secondary | ICD-10-CM | POA: Insufficient documentation

## 2018-01-21 DIAGNOSIS — Z79899 Other long term (current) drug therapy: Secondary | ICD-10-CM | POA: Insufficient documentation

## 2018-01-21 DIAGNOSIS — H9202 Otalgia, left ear: Secondary | ICD-10-CM | POA: Insufficient documentation

## 2018-01-21 MED ORDER — AMOXICILLIN 875 MG PO TABS: 875.0000 mg | ORAL_TABLET | Freq: Two times a day (BID) | ORAL | 0 refills | Status: DC

## 2018-01-21 MED ORDER — MAGIC MOUTHWASH W/LIDOCAINE: 5.0000 mL | Freq: Four times a day (QID) | ORAL | 0 refills | Status: DC

## 2018-01-21 MED ORDER — AMOXICILLIN 500 MG PO CAPS
1000.0000 mg | ORAL_CAPSULE | Freq: Once | ORAL | Status: AC
  Administered 2018-01-21: 1000 mg via ORAL
  Filled 2018-01-21: qty 2

## 2018-01-21 NOTE — ED Notes (Addendum)
States she has had broken tooth to left upper dentition x2 months approximately, but missed dental appointment. States now having pain to left ear. States left side of face is slightly swollen as well. Denies sneezing. +"Burning" to eyes. Patient also reports nausea with vomiting x4 today. Reports "my breath has a weird smell". Denies any known fevers.

## 2018-01-21 NOTE — ED Provider Notes (Signed)
[facility] Emergency Department Provider Note  ____________________________________________  Time seen: Approximately 7:34 PM  I have reviewed the triage vital signs and the nursing notes.   HISTORY  Chief Complaint Otalgia and Jaw Pain    HPI Jasmine Mason is a 32 y.o. female who presents the emergency department complaining of left upper dentition pain radiating into the left ear.  Patient reports that symptoms have been ongoing x2 days.  She reports that she has a tooth that is broken off at the gumline with surrounding swelling.  Patient reports that pain radiates from the site to her left ear.  She denies any fevers or chills, difficulty breathing or swallowing.  Patient denies any headache, visual changes, chest pain, shortness of breath, abdominal pain, nausea vomiting, diarrhea or constipation.  Patient is not tried any medications for this complaint prior to arrival.  Patient reports that she had an appointment scheduled previously to have the root of this tooth extracted but missed her appointment.    Past Medical History:  Diagnosis Date  . Back pain   . Back pain   . GERD (gastroesophageal reflux disease)   . Hypertension   . Mesenteric adenitis   . Obesity   . PCOS (polycystic ovarian syndrome)   . Tobacco abuse     Patient Active Problem List   Diagnosis Date Noted  . 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 Encounter for smoking cessation counseling 04/18/2017  . Essential hypertension 04/18/2017  . Epigastric abdominal pain   . Acute gastritis 09/20/2016  . Nausea with vomiting   . Hiatal hernia   . Uncontrollable vomiting 03/10/2016  . Abdominal pain, epigastric   . GERD (gastroesophageal reflux disease) 01/24/2015  . Hypokalemia 01/24/2015  . Abdominal pain 01/24/2015  . Leukocytosis 01/24/2015  . Chest pain 01/24/2015  . Cough 01/24/2015  . Pain of left calf 01/24/2015  . Elevated troponin 01/24/2015  . Obesity   .  Mesenteric adenitis   . Tobacco abuse     Past Surgical History:  Procedure Laterality Date  . CARPAL TUNNEL RELEASE Left ~ 2013  . COLONOSCOPY WITH PROPOFOL N/A 08/08/2017   Procedure: COLONOSCOPY WITH PROPOFOL;  Surgeon: Jonathon Bellows, MD;  Location: [facility] ENDOSCOPY;  Service: Gastroenterology;  Laterality: N/A;  . ESOPHAGOGASTRODUODENOSCOPY N/A 01/25/2015   Procedure: ESOPHAGOGASTRODUODENOSCOPY (EGD);  Surgeon: Irene Shipper, MD;  Location: [facility] ENDOSCOPY;  Service: Endoscopy;  Laterality: N/A;  . ESOPHAGOGASTRODUODENOSCOPY (EGD) WITH PROPOFOL N/A 03/13/2016   Procedure: ESOPHAGOGASTRODUODENOSCOPY (EGD) WITH PROPOFOL;  Surgeon: Lucilla Lame, MD;  Location: [facility] ENDOSCOPY;  Service: Endoscopy;  Laterality: N/A;  . ESOPHAGOGASTRODUODENOSCOPY (EGD) WITH PROPOFOL N/A 07/05/2017   Procedure: ESOPHAGOGASTRODUODENOSCOPY (EGD) WITH PROPOFOL;  Surgeon: Jonathon Bellows, MD;  Location: [facility] ENDOSCOPY;  Service: Gastroenterology;  Laterality: N/A;  . FRACTURE SURGERY    . ORIF ANKLE FRACTURE Left 1990's    Prior to Admission medications   Medication Sig Start Date End Date Taking? Authorizing Provider  Adapalene 0.3 % gel APPLY A THIN LAYER TO FACE QHS AND [place] OFF QAM 05/29/17   [provider]  alum & mag hydroxide-simeth (MAALOX MAX) 528-413-24 MG/5ML suspension Take 5 mLs by mouth every 6 (six) hours as needed for indigestion.    [provider]  amoxicillin (AMOXIL) 875 MG tablet Take 1 tablet (875 mg total) by mouth 2 (two) times daily. 01/21/18   Cuthriell, Charline Bills, PA-C  cyclobenzaprine (FLEXERIL) 5 MG tablet Take 1 tablet (5 mg total) by mouth 3 (three) times daily as  needed for muscle spasms. 10/22/17   Menshew, Dannielle Karvonen, PA-C  dicyclomine (BENTYL) 10 MG capsule Take 1 capsule (10 mg total) by mouth 4 (four) times daily -  before meals and at bedtime. 08/05/17   Jonathon Bellows, MD  docusate sodium (COLACE) 100 MG capsule Take 1 capsule (100 mg total) by mouth 2 (two) times daily.  06/27/17   Mikey College, NP  doxycycline (VIBRAMYCIN) 50 MG capsule Take 1 capsule by mouth daily. 05/27/17   [provider]  fluticasone (FLONASE) 50 MCG/ACT nasal spray Place 2 sprays into both nostrils daily. 06/27/17   Mikey College, NP  hydrochlorothiazide (HYDRODIURIL) 12.5 MG tablet TAKE 1 TABLET BY MOUTH ONCE DAILY 09/10/17   Mikey College, NP  HYDROcodone-acetaminophen (NORCO/VICODIN) 5-325 MG tablet Take 1-2 tablets by mouth every 6 (six) hours as needed. 09/27/14   [provider]  ibuprofen (ADVIL,MOTRIN) 800 MG tablet Take 1 tablet (800 mg total) by mouth 3 (three) times daily. 09/11/17   Orpah Greek, MD  magic mouthwash w/lidocaine SOLN Take 5 mLs by mouth 4 (four) times daily. 01/21/18   Cuthriell, Charline Bills, PA-C  medroxyPROGESTERone (PROVERA) 5 MG tablet Take 1 tablet (5 mg total) by mouth daily. Take if bleeding becomes heavy again 09/11/17   Pollina, Gwenyth Allegra, MD  nicotine (NICODERM CQ - DOSED IN MG/24 HR) 7 mg/24hr patch Place 1 patch (7 mg total) onto the skin daily. 04/12/17   Mikey College, NP  norethindrone-ethinyl estradiol-iron (LOESTRIN FE 1.5/30) 1.5-30 MG-MCG tablet Take 1 tablet by mouth at bedtime for 28 days. 06/27/17 07/25/17  Mikey College, NP  ondansetron (ZOFRAN) 4 MG tablet Take 1 tablet (4 mg total) by mouth every 8 (eight) hours as needed for nausea or vomiting. Patient not taking: Reported on 11/14/2017 07/24/17   Schuyler Amor, MD  pantoprazole (PROTONIX) 40 MG tablet TAKE 1 TABLET(40 MG) BY MOUTH TWICE DAILY 10/30/17   Mikey College, NP  promethazine (PHENERGAN) 12.5 MG tablet Take 1 tablet (12.5 mg total) by mouth every 6 (six) hours as needed for nausea or vomiting. 01/09/18   Merlyn Lot, MD  promethazine (PHENERGAN) 25 MG suppository Place 1 suppository (25 mg total) rectally every 6 (six) hours as needed for nausea. 11/14/17   Carrie Mew, MD  sucralfate (CARAFATE) 1 g tablet  Take 1 tablet (1 g total) by mouth 4 (four) times daily. Patient not taking: Reported on 08/08/2017 07/04/17   Jonathon Bellows, MD  traMADol (ULTRAM) 50 MG tablet Take 1 tablet (50 mg total) by mouth every 6 (six) hours as needed. Patient not taking: Reported on 11/14/2017 09/11/17   Orpah Greek, MD    Allergies Patient has no known allergies.  Family History  Problem Relation Age of Onset  . Hypertension Mother   . Hypertension Father   . Diabetes Father   . Heart disease Father   . Hypertension Sister   . Throat cancer Maternal Grandfather   . Stroke Paternal Grandmother   . Diabetes Paternal Grandmother   . Ovarian cancer Neg Hx   . Colon cancer Neg Hx   . Breast cancer Neg Hx     Social History Social History   Tobacco Use  . Smoking status: Current Every Day Smoker    Packs/day: 0.25    Years: 10.00    Pack years: 2.50    Types: Cigarettes  . Smokeless tobacco: Never Used  Substance Use Topics  . Alcohol use: No  Alcohol/week: 0.0 oz  . Drug use: No     Review of Systems  Constitutional: No fever/chills Eyes: No visual changes. No discharge ENT: Positive for left upper dentition pain, swelling radiating into the left ear Cardiovascular: no chest pain. Respiratory: no cough. No SOB. Gastrointestinal: No abdominal pain.  No nausea, no vomiting.  No diarrhea.  No constipation. Musculoskeletal: Negative for musculoskeletal pain. Skin: Negative for rash, abrasions, lacerations, ecchymosis. Neurological: Negative for headaches, focal weakness or numbness. 10-point ROS otherwise negative.  ____________________________________________   PHYSICAL EXAM:  VITAL SIGNS: ED Triage Vitals [01/21/18 1911]  Enc Vitals Group     BP 133/90     Pulse Rate 92     Resp 16     Temp 98.6 F (37 C)     Temp Source Oral     SpO2 98 %     Weight 220 lb (99.8 kg)     Height      Head Circumference      Peak Flow      Pain Score      Pain Loc      Pain Edu?       Excl. in [place]?      Constitutional: Alert and oriented. Well appearing and in no acute distress. Eyes: Conjunctivae are normal. PERRL. EOMI. Head: Atraumatic. ENT:      Ears: EACs and TMs unremarkable bilaterally.      Nose: No congestion/rhinnorhea.      Mouth/Throat: Mucous membranes are moist.  Tooth #12 eroded to the gumline with mild surrounding erythema and edema.  No purulent drainage.  No fluctuance with palpation and tongue depressor.  No appreciable abscess on exam.  Uvula is midline. Neck: No stridor.    Cardiovascular: Normal rate, regular rhythm. Normal S1 and S2.  Good peripheral circulation. Respiratory: Normal respiratory effort without tachypnea or retractions. Lungs CTAB. Good air entry to the bases with no decreased or absent breath sounds. Musculoskeletal: Full range of motion to all extremities. No gross deformities appreciated. Neurologic:  Normal speech and language. No gross focal neurologic deficits are appreciated.  Skin:  Skin is warm, dry and intact. No rash noted. Psychiatric: Mood and affect are normal. Speech and behavior are normal. Patient exhibits appropriate insight and judgement.   ____________________________________________   LABS (all labs ordered are listed, but only abnormal results are displayed)  Labs Reviewed - No data to display ____________________________________________  EKG   ____________________________________________  RADIOLOGY   No results found.  ____________________________________________    PROCEDURES  Procedure(s) performed:    Procedures    Medications  amoxicillin (AMOXIL) capsule 1,000 mg (has no administration in time range)     ____________________________________________   INITIAL IMPRESSION / ASSESSMENT AND PLAN / ED COURSE  Pertinent labs & imaging results that were available during my care of the patient were reviewed by me and considered in my medical decision making (see chart for  details).  Review of the [place] CSRS was performed in accordance of the [place] prior to dispensing any controlled drugs.      Patient's diagnosis is consistent with dental infection.  Patient presents the emergency department with left upper dentition pain.  On exam, patient has erosion of tooth #12 into the gumline.  Surrounding erythema and edema consistent with infection with no appreciable abscess requiring incision and drainage.  No indication for labs or imaging at this time.. Patient will be discharged home with prescriptions for amoxicillin and Magic mouthwash. Patient is to follow up  with dentist as needed or otherwise directed. Patient is given ED precautions to return to the ED for any worsening or new symptoms.     ____________________________________________  FINAL CLINICAL IMPRESSION(S) / ED DIAGNOSES  Final diagnoses:  Dental infection      NEW MEDICATIONS STARTED DURING THIS VISIT:  ED Discharge Orders        Ordered    amoxicillin (AMOXIL) 875 MG tablet  2 times daily     01/21/18 2012    magic mouthwash w/lidocaine SOLN  4 times daily    Note to Pharmacy:  Dispense in a 1/1/1 ratio. Use lidocaine, diphenhydramine, prednisolone   01/21/18 2012          This chart was dictated using voice recognition software/Dragon. Despite best efforts to proofread, errors can occur which can change the meaning. Any change was purely unintentional.    Brynda Peon 01/21/18 2013    Nena Polio, MD 01/21/18 [phone]

## 2018-01-21 NOTE — ED Triage Notes (Signed)
Pt c/o left jaw swelling/pain around broken upper back tooth as well as left ear pain x2 days, unrelieved by OTC meds.

## 2018-03-21 ENCOUNTER — Emergency Department
Admission: EM | Admit: 2018-03-21 | Discharge: 2018-03-21 | Disposition: A | Discharge status: Home / Self Care | Payer: Self-pay | Attending: Emergency Medicine | Admitting: Emergency Medicine

## 2018-03-21 ENCOUNTER — Encounter: Payer: Self-pay | Admitting: Emergency Medicine

## 2018-03-21 DIAGNOSIS — Z79899 Other long term (current) drug therapy: Secondary | ICD-10-CM | POA: Insufficient documentation

## 2018-03-21 DIAGNOSIS — I10 Essential (primary) hypertension: Secondary | ICD-10-CM | POA: Insufficient documentation

## 2018-03-21 DIAGNOSIS — K3184 Gastroparesis: Secondary | ICD-10-CM

## 2018-03-21 DIAGNOSIS — F17210 Nicotine dependence, cigarettes, uncomplicated: Secondary | ICD-10-CM | POA: Insufficient documentation

## 2018-03-21 LAB — URINALYSIS, COMPLETE (UACMP) WITH MICROSCOPIC
Bilirubin Urine: NEGATIVE
Glucose, UA: NEGATIVE mg/dL
Ketones, ur: 5 mg/dL — AB
LEUKOCYTES UA: NEGATIVE
Nitrite: NEGATIVE
PROTEIN: 100 mg/dL — AB
SPECIFIC GRAVITY, URINE: 1.021 (ref 1.005–1.030)
pH: 5 (ref 5.0–8.0)

## 2018-03-21 LAB — COMPREHENSIVE METABOLIC PANEL
ALBUMIN: 4.8 g/dL (ref 3.5–5.0)
ALT: 21 U/L (ref 0–44)
AST: 23 U/L (ref 15–41)
Alkaline Phosphatase: 86 U/L (ref 38–126)
Anion gap: 11 (ref 5–15)
BUN: 12 mg/dL (ref 6–20)
CHLORIDE: 102 mmol/L (ref 98–111)
CO2: 24 mmol/L (ref 22–32)
CREATININE: 0.93 mg/dL (ref 0.44–1.00)
Calcium: 9.2 mg/dL (ref 8.9–10.3)
GFR calc Af Amer: >60 mL/min (ref >60)
GLUCOSE: 136 mg/dL — AB (ref 70–99)
POTASSIUM: 3.2 mmol/L — AB (ref 3.5–5.1)
Sodium: 137 mmol/L (ref 135–145)
Total Bilirubin: 1 mg/dL (ref 0.3–1.2)
Total Protein: 8.4 g/dL — ABNORMAL HIGH (ref 6.5–8.1)

## 2018-03-21 LAB — LIPASE, BLOOD: LIPASE: 25 U/L (ref 11–51)

## 2018-03-21 LAB — CBC
HEMATOCRIT: 48.7 % — AB (ref 35.0–47.0)
Hemoglobin: 16.6 g/dL — ABNORMAL HIGH (ref 12.0–16.0)
MCH: 29.9 pg (ref 26.0–34.0)
MCHC: 34 g/dL (ref 32.0–36.0)
MCV: 87.8 fL (ref 80.0–100.0)
PLATELETS: 379 10*3/uL (ref 150–440)
RBC: 5.55 MIL/uL — ABNORMAL HIGH (ref 3.80–5.20)
RDW: 13.9 % (ref 11.5–14.5)
WBC: 21.7 10*3/uL — ABNORMAL HIGH (ref 3.6–11.0)

## 2018-03-21 LAB — POCT PREGNANCY, URINE: PREG TEST UR: NEGATIVE

## 2018-03-21 MED ORDER — METOCLOPRAMIDE HCL 5 MG/ML IJ SOLN
10.0000 mg | Freq: Once | INTRAMUSCULAR | Status: AC
  Administered 2018-03-21: 10 mg via INTRAVENOUS
  Filled 2018-03-21: qty 2

## 2018-03-21 MED ORDER — MORPHINE SULFATE (PF) 4 MG/ML IV SOLN
4.0000 mg | Freq: Once | INTRAVENOUS | Status: AC
  Administered 2018-03-21: 4 mg via INTRAVENOUS
  Filled 2018-03-21: qty 1

## 2018-03-21 MED ORDER — METOCLOPRAMIDE HCL 10 MG PO TABS: 10.0000 mg | ORAL_TABLET | Freq: Three times a day (TID) | ORAL | 0 refills | Status: DC | PRN

## 2018-03-21 MED ORDER — SODIUM CHLORIDE 0.9 % IV BOLUS
1000.0000 mL | Freq: Once | INTRAVENOUS | Status: AC
  Administered 2018-03-21: 1000 mL via INTRAVENOUS

## 2018-03-21 MED ORDER — GI COCKTAIL ~~LOC~~
30.0000 mL | Freq: Once | ORAL | Status: AC
  Administered 2018-03-21: 30 mL via ORAL
  Filled 2018-03-21: qty 30

## 2018-03-21 NOTE — ED Triage Notes (Signed)
Pt to ED with abdominal pain and vomiting. Pt has hx of gastritis. Pt afebrile and denies diarrhea.

## 2018-03-21 NOTE — ED Provider Notes (Signed)
[facility] Emergency Department Provider Note ____________________________________________   First MD Initiated Contact with Patient 03/21/18 1951     (approximate)  I have reviewed the triage vital signs and the nursing notes.   HISTORY  Chief Complaint Emesis  HPI Jasmine Mason is a 32 y.o. female with a history of gastroparesis, GERD and PCOS was presented to emergency department today with 2 days of intractable nausea vomiting and diarrhea.  Says that her pain is to the upper abdomen and feels like a burning pain that worse when she lies back.  She had multiple episodes of this in the past.  Does not take any Reglan at home but says that she has had gastroenterology studies which is showed slow gastric emptying.  She says the pain is an 8-10 and is a burning type of pain which does not radiate.  Does not report any burning with urination, vaginal bleeding or discharge.   Past Medical History:  Diagnosis Date  . Back pain   . Back pain   . GERD (gastroesophageal reflux disease)   . Hypertension   . Mesenteric adenitis   . Obesity   . PCOS (polycystic ovarian syndrome)   . Tobacco abuse     Patient Active Problem List   Diagnosis Date Noted  . 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 Encounter for smoking cessation counseling 04/18/2017  . Essential hypertension 04/18/2017  . Epigastric abdominal pain   . Acute gastritis 09/20/2016  . Nausea with vomiting   . Hiatal hernia   . Uncontrollable vomiting 03/10/2016  . Abdominal pain, epigastric   . GERD (gastroesophageal reflux disease) 01/24/2015  . Hypokalemia 01/24/2015  . Abdominal pain 01/24/2015  . Leukocytosis 01/24/2015  . Chest pain 01/24/2015  . Cough 01/24/2015  . Pain of left calf 01/24/2015  . Elevated troponin 01/24/2015  . Obesity   . Mesenteric adenitis   . Tobacco abuse     Past Surgical History:  Procedure Laterality Date  . CARPAL TUNNEL RELEASE Left ~ 2013  .  COLONOSCOPY WITH PROPOFOL N/A 08/08/2017   Procedure: COLONOSCOPY WITH PROPOFOL;  Surgeon: Jonathon Bellows, MD;  Location: [facility] ENDOSCOPY;  Service: Gastroenterology;  Laterality: N/A;  . ESOPHAGOGASTRODUODENOSCOPY N/A 01/25/2015   Procedure: ESOPHAGOGASTRODUODENOSCOPY (EGD);  Surgeon: Irene Shipper, MD;  Location: [facility] ENDOSCOPY;  Service: Endoscopy;  Laterality: N/A;  . ESOPHAGOGASTRODUODENOSCOPY (EGD) WITH PROPOFOL N/A 03/13/2016   Procedure: ESOPHAGOGASTRODUODENOSCOPY (EGD) WITH PROPOFOL;  Surgeon: Lucilla Lame, MD;  Location: [facility] ENDOSCOPY;  Service: Endoscopy;  Laterality: N/A;  . ESOPHAGOGASTRODUODENOSCOPY (EGD) WITH PROPOFOL N/A 07/05/2017   Procedure: ESOPHAGOGASTRODUODENOSCOPY (EGD) WITH PROPOFOL;  Surgeon: Jonathon Bellows, MD;  Location: [facility] ENDOSCOPY;  Service: Gastroenterology;  Laterality: N/A;  . FRACTURE SURGERY    . ORIF ANKLE FRACTURE Left 1990's    Prior to Admission medications   Medication Sig Start Date End Date Taking? Authorizing Provider  Adapalene 0.3 % gel APPLY A THIN LAYER TO FACE QHS AND [place] OFF QAM 05/29/17   [provider]  alum & mag hydroxide-simeth (MAALOX MAX) 007-121-97 MG/5ML suspension Take 5 mLs by mouth every 6 (six) hours as needed for indigestion.    [provider]  amoxicillin (AMOXIL) 875 MG tablet Take 1 tablet (875 mg total) by mouth 2 (two) times daily. 01/21/18   Cuthriell, Charline Bills, PA-C  cyclobenzaprine (FLEXERIL) 5 MG tablet Take 1 tablet (5 mg total) by mouth 3 (three) times daily as needed for muscle spasms. 10/22/17   Menshew, Dannielle Karvonen,  PA-C  dicyclomine (BENTYL) 10 MG capsule Take 1 capsule (10 mg total) by mouth 4 (four) times daily -  before meals and at bedtime. 08/05/17   Jonathon Bellows, MD  docusate sodium (COLACE) 100 MG capsule Take 1 capsule (100 mg total) by mouth 2 (two) times daily. 06/27/17   Mikey College, NP  doxycycline (VIBRAMYCIN) 50 MG capsule Take 1 capsule by mouth daily. 05/27/17   [provider]    fluticasone (FLONASE) 50 MCG/ACT nasal spray Place 2 sprays into both nostrils daily. 06/27/17   Mikey College, NP  hydrochlorothiazide (HYDRODIURIL) 12.5 MG tablet TAKE 1 TABLET BY MOUTH ONCE DAILY 09/10/17   Mikey College, NP  HYDROcodone-acetaminophen (NORCO/VICODIN) 5-325 MG tablet Take 1-2 tablets by mouth every 6 (six) hours as needed. 09/27/14   [provider]  ibuprofen (ADVIL,MOTRIN) 800 MG tablet Take 1 tablet (800 mg total) by mouth 3 (three) times daily. 09/11/17   Orpah Greek, MD  magic mouthwash w/lidocaine SOLN Take 5 mLs by mouth 4 (four) times daily. 01/21/18   Cuthriell, Charline Bills, PA-C  medroxyPROGESTERone (PROVERA) 5 MG tablet Take 1 tablet (5 mg total) by mouth daily. Take if bleeding becomes heavy again 09/11/17   Pollina, Gwenyth Allegra, MD  nicotine (NICODERM CQ - DOSED IN MG/24 HR) 7 mg/24hr patch Place 1 patch (7 mg total) onto the skin daily. 04/12/17   Mikey College, NP  norethindrone-ethinyl estradiol-iron (LOESTRIN FE 1.5/30) 1.5-30 MG-MCG tablet Take 1 tablet by mouth at bedtime for 28 days. 06/27/17 07/25/17  Mikey College, NP  ondansetron (ZOFRAN) 4 MG tablet Take 1 tablet (4 mg total) by mouth every 8 (eight) hours as needed for nausea or vomiting. Patient not taking: Reported on 11/14/2017 07/24/17   Schuyler Amor, MD  pantoprazole (PROTONIX) 40 MG tablet TAKE 1 TABLET(40 MG) BY MOUTH TWICE DAILY 10/30/17   Mikey College, NP  promethazine (PHENERGAN) 12.5 MG tablet Take 1 tablet (12.5 mg total) by mouth every 6 (six) hours as needed for nausea or vomiting. 01/09/18   Merlyn Lot, MD  promethazine (PHENERGAN) 25 MG suppository Place 1 suppository (25 mg total) rectally every 6 (six) hours as needed for nausea. 11/14/17   Carrie Mew, MD  sucralfate (CARAFATE) 1 g tablet Take 1 tablet (1 g total) by mouth 4 (four) times daily. Patient not taking: Reported on 08/08/2017 07/04/17   Jonathon Bellows, MD  traMADol  (ULTRAM) 50 MG tablet Take 1 tablet (50 mg total) by mouth every 6 (six) hours as needed. Patient not taking: Reported on 11/14/2017 09/11/17   Orpah Greek, MD    Allergies Patient has no known allergies.  Family History  Problem Relation Age of Onset  . Hypertension Mother   . Hypertension Father   . Diabetes Father   . Heart disease Father   . Hypertension Sister   . Throat cancer Maternal Grandfather   . Stroke Paternal Grandmother   . Diabetes Paternal Grandmother   . Ovarian cancer Neg Hx   . Colon cancer Neg Hx   . Breast cancer Neg Hx     Social History Social History   Tobacco Use  . Smoking status: Current Every Day Smoker    Packs/day: 0.25    Years: 10.00    Pack years: 2.50    Types: Cigarettes  . Smokeless tobacco: Never Used  Substance Use Topics  . Alcohol use: No    Alcohol/week: 0.0 standard drinks  . Drug use:  No    Review of Systems  Constitutional: No fever/chills Eyes: No visual changes. ENT: No sore throat. Cardiovascular: Denies chest pain. Respiratory: Denies shortness of breath. Gastrointestinal:  No diarrhea.  No constipation. Genitourinary: Negative for dysuria. Musculoskeletal: Negative for back pain. Skin: Negative for rash. Neurological: Negative for headaches, focal weakness or numbness.   ____________________________________________   PHYSICAL EXAM:  VITAL SIGNS: ED Triage Vitals  Enc Vitals Group     BP 03/21/18 1853 130/81     Pulse Rate 03/21/18 1853 (!) 113     Resp 03/21/18 1853 18     Temp 03/21/18 1853 98.6 F (37 C)     Temp Source 03/21/18 1853 Oral     SpO2 03/21/18 1853 100 %     Weight 03/21/18 1854 220 lb (99.8 kg)     Height 03/21/18 1854 5\' 7"  (1.702 m)     Head Circumference --      Peak Flow --      Pain Score 03/21/18 1853 8     Pain Loc --      Pain Edu? --      Excl. in [place]? --     Constitutional: Alert and oriented. Well appearing and in no acute distress. Eyes: Conjunctivae are  normal.  Head: Atraumatic. Nose: No congestion/rhinnorhea. Mouth/Throat: Mucous membranes are moist.  Neck: No stridor.   Cardiovascular: Normal rate, regular rhythm. Grossly normal heart sounds.  Good peripheral circulation. Respiratory: Normal respiratory effort.  No retractions. Lungs CTAB. Gastrointestinal: Soft with right upper quadrant as well as epigastric tenderness to palpation. No distention. No CVA tenderness.  No lower abdominal tenderness. Musculoskeletal: No lower extremity tenderness nor edema.  No joint effusions. Neurologic:  Normal speech and language. No gross focal neurologic deficits are appreciated. Skin:  Skin is warm, dry and intact. No rash noted. Psychiatric: Mood and affect are normal. Speech and behavior are normal.  ____________________________________________   LABS (all labs ordered are listed, but only abnormal results are displayed)  Labs Reviewed  COMPREHENSIVE METABOLIC PANEL - Abnormal; Notable for the following components:      Result Value   Potassium 3.2 (*)    Glucose, Bld 136 (*)    Total Protein 8.4 (*)    All other components within normal limits  CBC - Abnormal; Notable for the following components:   WBC 21.7 (*)    RBC 5.55 (*)    Hemoglobin 16.6 (*)    HCT 48.7 (*)    All other components within normal limits  URINALYSIS, COMPLETE (UACMP) WITH MICROSCOPIC - Abnormal; Notable for the following components:   Color, Urine AMBER (*)    APPearance CLOUDY (*)    Hgb urine dipstick SMALL (*)    Ketones, ur 5 (*)    Protein, ur 100 (*)    Bacteria, UA RARE (*)    All other components within normal limits  LIPASE, BLOOD  POC URINE PREG, ED  POCT PREGNANCY, URINE   ____________________________________________  EKG   ____________________________________________  RADIOLOGY   ____________________________________________   PROCEDURES  Procedure(s) performed:   Procedures  Critical Care performed:    ____________________________________________   INITIAL IMPRESSION / ASSESSMENT AND PLAN / ED COURSE  Pertinent labs & imaging results that were available during my care of the patient were reviewed by me and considered in my medical decision making (see chart for details).  Differential diagnosis includes, but is not limited to, ovarian cyst, ovarian torsion, acute appendicitis, diverticulitis, urinary tract infection/pyelonephritis, endometriosis, bowel  obstruction, colitis, renal colic, gastroenteritis, hernia, fibroids, endometriosis, pregnancy related pain including ectopic pregnancy, etc. As part of my medical decision making, I reviewed the following data within the [facility] Notes from prior ED visits  Reviewed patient's previous visits.  Seems to be similar presentation to previous visits including the elevated white blood cell count which appears chronic.  Will try symptom medic treatment and reassess.  ----------------------------------------- 11:15 PM on 03/21/2018 -----------------------------------------  Patient no longer with pain.  Tolerating p.o. fluids.  Abdomen reexamined and she is nontender diffusely.  Patient likely with gastroparesis.  Has pantoprazole at home.  Will add Reglan.  Patient to be DC'd at this time to follow-up with gastroenterology.  She is understanding of the diagnosis as well as treatment and willing to comply.  I will not image her at this time.  Symptoms relieved with symptomatic treatment and her white blood cell count is chronic.  Likely ongoing issues with her gastroparesis and gastritis. ____________________________________________   FINAL CLINICAL IMPRESSION(S) / ED DIAGNOSES  Gastroparesis.  Abdominal pain.  NEW MEDICATIONS STARTED DURING THIS VISIT:  New Prescriptions   No medications on file     Note:  This document was prepared using Dragon voice recognition software and may include unintentional dictation  errors.     Orbie Pyo, MD 03/21/18 [phone]

## 2018-03-21 NOTE — ED Notes (Signed)
Pt states feeling much better. Pt states she no longer feels nauseated and stomach does not hurt anymore. Pt requesting to take GI cocktail now. Will administer. Will continue to monitor.

## 2018-03-21 NOTE — ED Notes (Addendum)
Urine preg NEGATIVE, not enough urine to send for analysis

## 2018-03-22 ENCOUNTER — Encounter: Payer: Self-pay | Admitting: Emergency Medicine

## 2018-03-22 ENCOUNTER — Emergency Department
Admission: EM | Admit: 2018-03-22 | Discharge: 2018-03-22 | Disposition: A | Discharge status: Home / Self Care | Payer: Self-pay | Attending: Emergency Medicine | Admitting: Emergency Medicine

## 2018-03-22 ENCOUNTER — Other Ambulatory Visit: Payer: Self-pay

## 2018-03-22 DIAGNOSIS — Z79899 Other long term (current) drug therapy: Secondary | ICD-10-CM | POA: Insufficient documentation

## 2018-03-22 DIAGNOSIS — R112 Nausea with vomiting, unspecified: Secondary | ICD-10-CM

## 2018-03-22 DIAGNOSIS — K3184 Gastroparesis: Secondary | ICD-10-CM | POA: Insufficient documentation

## 2018-03-22 DIAGNOSIS — I10 Essential (primary) hypertension: Secondary | ICD-10-CM | POA: Insufficient documentation

## 2018-03-22 DIAGNOSIS — F17210 Nicotine dependence, cigarettes, uncomplicated: Secondary | ICD-10-CM | POA: Insufficient documentation

## 2018-03-22 LAB — CBC WITH DIFFERENTIAL/PLATELET
BASOS ABS: 0 10*3/uL (ref 0–0.1)
Basophils Relative: 0 %
EOS ABS: 0 10*3/uL (ref 0–0.7)
Eosinophils Relative: 0 %
HEMATOCRIT: 44.9 % (ref 35.0–47.0)
HEMOGLOBIN: 15.3 g/dL (ref 12.0–16.0)
Lymphocytes Relative: 17 %
Lymphs Abs: 2.8 10*3/uL (ref 1.0–3.6)
MCH: 30.4 pg (ref 26.0–34.0)
MCHC: 34.1 g/dL (ref 32.0–36.0)
MCV: 89 fL (ref 80.0–100.0)
MONO ABS: 0.8 10*3/uL (ref 0.2–0.9)
MONOS PCT: 5 %
NEUTROS ABS: 12.7 10*3/uL — AB (ref 1.4–6.5)
NEUTROS PCT: 78 %
Platelets: 335 10*3/uL (ref 150–440)
RBC: 5.04 MIL/uL (ref 3.80–5.20)
RDW: 14.1 % (ref 11.5–14.5)
WBC: 16.4 10*3/uL — ABNORMAL HIGH (ref 3.6–11.0)

## 2018-03-22 LAB — COMPREHENSIVE METABOLIC PANEL
ALBUMIN: 4.4 g/dL (ref 3.5–5.0)
ALK PHOS: 80 U/L (ref 38–126)
ALT: 22 U/L (ref 0–44)
ANION GAP: 10 (ref 5–15)
AST: 26 U/L (ref 15–41)
BILIRUBIN TOTAL: 1 mg/dL (ref 0.3–1.2)
BUN: 13 mg/dL (ref 6–20)
CALCIUM: 8.9 mg/dL (ref 8.9–10.3)
CO2: 24 mmol/L (ref 22–32)
Chloride: 104 mmol/L (ref 98–111)
Creatinine, Ser: 0.76 mg/dL (ref 0.44–1.00)
GLUCOSE: 108 mg/dL — AB (ref 70–99)
Potassium: 3.4 mmol/L — ABNORMAL LOW (ref 3.5–5.1)
SODIUM: 138 mmol/L (ref 135–145)
TOTAL PROTEIN: 7.8 g/dL (ref 6.5–8.1)

## 2018-03-22 LAB — LIPASE, BLOOD: LIPASE: 27 U/L (ref 11–51)

## 2018-03-22 MED ORDER — METOCLOPRAMIDE HCL 5 MG/ML IJ SOLN: 10.0000 mg | Freq: Once | INTRAMUSCULAR | Status: DC

## 2018-03-22 MED ORDER — SODIUM CHLORIDE 0.9 % IV BOLUS
1000.0000 mL | Freq: Once | INTRAVENOUS | Status: AC
  Administered 2018-03-22: 1000 mL via INTRAVENOUS

## 2018-03-22 MED ORDER — ONDANSETRON 4 MG PO TBDP: 4.0000 mg | ORAL_TABLET | Freq: Three times a day (TID) | ORAL | 0 refills | Status: DC | PRN

## 2018-03-22 MED ORDER — ONDANSETRON HCL 4 MG/2ML IJ SOLN
4.0000 mg | Freq: Once | INTRAMUSCULAR | Status: AC
  Administered 2018-03-22: 4 mg via INTRAVENOUS
  Filled 2018-03-22: qty 2

## 2018-03-22 MED ORDER — HALOPERIDOL LACTATE 5 MG/ML IJ SOLN
2.0000 mg | Freq: Once | INTRAMUSCULAR | Status: AC
  Administered 2018-03-22: 2 mg via INTRAVENOUS
  Filled 2018-03-22: qty 1

## 2018-03-22 NOTE — ED Notes (Signed)
Pt is asleep. Pt appears to be in no acute distress. Will continue to monitor.

## 2018-03-22 NOTE — ED Provider Notes (Signed)
[facility] Emergency Department Provider Note  ____________________________________________  Time seen: Approximately 8:49 AM  I have reviewed the triage vital signs and the nursing notes.   HISTORY  Chief Complaint Emesis   HPI Jasmine Mason is a 32 y.o. female with a history of GERD, gastroparesis, PCOS, and hypertension who presents for evaluation of nausea and vomiting.  Patient reports this is day 3 of her illness.  She was seen here yesterday and was discharged home after she felt better.  She reports held in the middle of the night she started having recurrence of her symptoms.  She reports several daily episodes of nonbloody nonbilious emesis, inability to tolerate p.o.  Has not been able to take her medications this morning.  No diarrhea and no fevers.  Had chills last night.  No dysuria or hematuria.  Has been having normal bowel movements with the last one this morning.  No prior abdominal surgeries.  She is also complaining of 9 out of 10 burning abdominal pain that is located on the right quadrants and epigastric region.  Patient reports that she has several flares of her gastroparesis every summer usually 2-3 a month.  This summer has been the same.  She reports that with all of them she has this right sided burning abdominal pain.  Past Medical History:  Diagnosis Date  . Back pain   . Back pain   . GERD (gastroesophageal reflux disease)   . Hypertension   . Mesenteric adenitis   . Obesity   . PCOS (polycystic ovarian syndrome)   . Tobacco abuse     Patient Active Problem List   Diagnosis Date Noted  . 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 Encounter for smoking cessation counseling 04/18/2017  . Essential hypertension 04/18/2017  . Epigastric abdominal pain   . Acute gastritis 09/20/2016  . Nausea with vomiting   . Hiatal hernia   . Uncontrollable vomiting 03/10/2016  . Abdominal pain, epigastric   . GERD (gastroesophageal reflux  disease) 01/24/2015  . Hypokalemia 01/24/2015  . Abdominal pain 01/24/2015  . Leukocytosis 01/24/2015  . Chest pain 01/24/2015  . Cough 01/24/2015  . Pain of left calf 01/24/2015  . Elevated troponin 01/24/2015  . Obesity   . Mesenteric adenitis   . Tobacco abuse     Past Surgical History:  Procedure Laterality Date  . CARPAL TUNNEL RELEASE Left ~ 2013  . COLONOSCOPY WITH PROPOFOL N/A 08/08/2017   Procedure: COLONOSCOPY WITH PROPOFOL;  Surgeon: Jonathon Bellows, MD;  Location: [facility] ENDOSCOPY;  Service: Gastroenterology;  Laterality: N/A;  . ESOPHAGOGASTRODUODENOSCOPY N/A 01/25/2015   Procedure: ESOPHAGOGASTRODUODENOSCOPY (EGD);  Surgeon: Irene Shipper, MD;  Location[facility] ENDOSCOPY;  Service: Endoscopy;  Laterality: N/A;  . ESOPHAGOGASTRODUODENOSCOPY (EGD) WITH PROPOFOL N/A 03/13/2016   Procedure: ESOPHAGOGASTRODUODENOSCOPY (EGD) WITH PROPOFOL;  Surgeon: Lucilla Lame, MD;  Location: [facility] ENDOSCOPY;  Service: Endoscopy;  Laterality: N/A;  . ESOPHAGOGASTRODUODENOSCOPY (EGD) WITH PROPOFOL N/A 07/05/2017   Procedure: ESOPHAGOGASTRODUODENOSCOPY (EGD) WITH PROPOFOL;  Surgeon: Jonathon Bellows, MD;  Location: [facility] ENDOSCOPY;  Service: Gastroenterology;  Laterality: N/A;  . FRACTURE SURGERY    . ORIF ANKLE FRACTURE Left 1990's    Prior to Admission medications   Medication Sig Start Date End Date Taking? Authorizing Provider  Adapalene 0.3 % gel APPLY A THIN LAYER TO FACE QHS AND [place] OFF QAM 05/29/17   [provider]  alum & mag hydroxide-simeth (MAALOX MAX) 240-973-53 MG/5ML suspension Take 5 mLs by mouth every 6 (six) hours as  needed for indigestion.    [provider]  amoxicillin (AMOXIL) 875 MG tablet Take 1 tablet (875 mg total) by mouth 2 (two) times daily. 01/21/18   Cuthriell, Charline Bills, PA-C  cyclobenzaprine (FLEXERIL) 5 MG tablet Take 1 tablet (5 mg total) by mouth 3 (three) times daily as needed for muscle spasms. 10/22/17   Menshew, Dannielle Karvonen, PA-C  dicyclomine (BENTYL) 10  MG capsule Take 1 capsule (10 mg total) by mouth 4 (four) times daily -  before meals and at bedtime. 08/05/17   Jonathon Bellows, MD  docusate sodium (COLACE) 100 MG capsule Take 1 capsule (100 mg total) by mouth 2 (two) times daily. 06/27/17   Mikey College, NP  doxycycline (VIBRAMYCIN) 50 MG capsule Take 1 capsule by mouth daily. 05/27/17   [provider]  fluticasone (FLONASE) 50 MCG/ACT nasal spray Place 2 sprays into both nostrils daily. 06/27/17   Mikey College, NP  hydrochlorothiazide (HYDRODIURIL) 12.5 MG tablet TAKE 1 TABLET BY MOUTH ONCE DAILY 09/10/17   Mikey College, NP  HYDROcodone-acetaminophen (NORCO/VICODIN) 5-325 MG tablet Take 1-2 tablets by mouth every 6 (six) hours as needed. 09/27/14   [provider]  ibuprofen (ADVIL,MOTRIN) 800 MG tablet Take 1 tablet (800 mg total) by mouth 3 (three) times daily. 09/11/17   Orpah Greek, MD  magic mouthwash w/lidocaine SOLN Take 5 mLs by mouth 4 (four) times daily. 01/21/18   Cuthriell, Charline Bills, PA-C  medroxyPROGESTERone (PROVERA) 5 MG tablet Take 1 tablet (5 mg total) by mouth daily. Take if bleeding becomes heavy again 09/11/17   Pollina, Gwenyth Allegra, MD  metoCLOPramide (REGLAN) 10 MG tablet Take 1 tablet (10 mg total) by mouth every 8 (eight) hours as needed for nausea or vomiting. 03/21/18 03/21/19  Orbie Pyo, MD  nicotine (NICODERM CQ - DOSED IN MG/24 HR) 7 mg/24hr patch Place 1 patch (7 mg total) onto the skin daily. 04/12/17   Mikey College, NP  norethindrone-ethinyl estradiol-iron (LOESTRIN FE 1.5/30) 1.5-30 MG-MCG tablet Take 1 tablet by mouth at bedtime for 28 days. 06/27/17 07/25/17  Mikey College, NP  ondansetron (ZOFRAN ODT) 4 MG disintegrating tablet Take 1 tablet (4 mg total) by mouth every 8 (eight) hours as needed for nausea or vomiting. 03/22/18   Alfred Levins, [place], MD  ondansetron (ZOFRAN) 4 MG tablet Take 1 tablet (4 mg total) by mouth every 8 (eight) hours  as needed for nausea or vomiting. Patient not taking: Reported on 11/14/2017 07/24/17   Schuyler Amor, MD  pantoprazole (PROTONIX) 40 MG tablet TAKE 1 TABLET(40 MG) BY MOUTH TWICE DAILY 10/30/17   Mikey College, NP  promethazine (PHENERGAN) 12.5 MG tablet Take 1 tablet (12.5 mg total) by mouth every 6 (six) hours as needed for nausea or vomiting. 01/09/18   Merlyn Lot, MD  promethazine (PHENERGAN) 25 MG suppository Place 1 suppository (25 mg total) rectally every 6 (six) hours as needed for nausea. 11/14/17   Carrie Mew, MD  sucralfate (CARAFATE) 1 g tablet Take 1 tablet (1 g total) by mouth 4 (four) times daily. Patient not taking: Reported on 08/08/2017 07/04/17   Jonathon Bellows, MD  traMADol (ULTRAM) 50 MG tablet Take 1 tablet (50 mg total) by mouth every 6 (six) hours as needed. Patient not taking: Reported on 11/14/2017 09/11/17   Orpah Greek, MD    Allergies Patient has no known allergies.  Family History  Problem Relation Age of Onset  . Hypertension Mother   .  Hypertension Father   . Diabetes Father   . Heart disease Father   . Hypertension Sister   . Throat cancer Maternal Grandfather   . Stroke Paternal Grandmother   . Diabetes Paternal Grandmother   . Ovarian cancer Neg Hx   . Colon cancer Neg Hx   . Breast cancer Neg Hx     Social History Social History   Tobacco Use  . Smoking status: Current Every Day Smoker    Packs/day: 0.25    Years: 10.00    Pack years: 2.50    Types: Cigarettes  . Smokeless tobacco: Never Used  Substance Use Topics  . Alcohol use: No    Alcohol/week: 0.0 standard drinks  . Drug use: No    Review of Systems  Constitutional: Negative for fever. Eyes: Negative for visual changes. ENT: Negative for sore throat. Neck: No neck pain  Cardiovascular: Negative for chest pain. Respiratory: Negative for shortness of breath. Gastrointestinal: + abdominal pain, nausea, and vomiting. No diarrhea. Genitourinary:  Negative for dysuria. Musculoskeletal: Negative for back pain. Skin: Negative for rash. Neurological: Negative for headaches, weakness or numbness. Psych: No SI or HI  ____________________________________________   PHYSICAL EXAM:  VITAL SIGNS: ED Triage Vitals  Enc Vitals Group     BP 03/22/18 0753 (!) 141/56     Pulse Rate 03/22/18 0753 94     Resp 03/22/18 0753 18     Temp 03/22/18 0753 98.6 F (37 C)     Temp Source 03/22/18 0753 Oral     SpO2 03/22/18 0753 95 %     Weight 03/22/18 0752 220 lb (99.8 kg)     Height 03/22/18 0752 5\' 7"  (1.702 m)     Head Circumference --      Peak Flow --      Pain Score 03/22/18 0752 9     Pain Loc --      Pain Edu? --      Excl. in [place]? --     Constitutional: Alert and oriented. Well appearing and in no apparent distress. HEENT:      Head: Normocephalic and atraumatic.         Eyes: Conjunctivae are normal. Sclera is non-icteric.       Mouth/Throat: Mucous membranes are moist.       Neck: Supple with no signs of meningismus. Cardiovascular: Regular rate and rhythm. No murmurs, gallops, or rubs. 2+ symmetrical distal pulses are present in all extremities. No JVD. Respiratory: Normal respiratory effort. Lungs are clear to auscultation bilaterally. No wheezes, crackles, or rhonchi.  Gastrointestinal: Soft, diffuse tenderness to palpation over the R quadrants and epigastric region, and non distended with positive bowel sounds. No rebound or guarding. Musculoskeletal: Nontender with normal range of motion in all extremities. No edema, cyanosis, or erythema of extremities. Neurologic: Normal speech and language. Face is symmetric. Moving all extremities. No gross focal neurologic deficits are appreciated. Skin: Skin is warm, dry and intact. No rash noted. Psychiatric: Mood and affect are normal. Speech and behavior are normal.  ____________________________________________   LABS (all labs ordered are listed, but only abnormal results are  displayed)  Labs Reviewed  CBC WITH DIFFERENTIAL/PLATELET - Abnormal; Notable for the following components:      Result Value   WBC 16.4 (*)    Neutro Abs 12.7 (*)    All other components within normal limits  COMPREHENSIVE METABOLIC PANEL - Abnormal; Notable for the following components:   Potassium 3.4 (*)    Glucose,  Bld 108 (*)    All other components within normal limits  LIPASE, BLOOD   ____________________________________________  EKG  none  ____________________________________________  RADIOLOGY  none  ____________________________________________   PROCEDURES  Procedure(s) performed: None Procedures Critical Care performed:  None ____________________________________________   INITIAL IMPRESSION / ASSESSMENT AND PLAN / ED COURSE  32 y.o. female with a history of GERD, gastroparesis, PCOS, and hypertension who presents for evaluation of nausea and vomiting and abdominal pain.  Patient reports that her symptoms are similar to her prior exacerbations of gastroparesis which she usually has several a month during the summer months.  She is not a diabetic.  She is otherwise well-appearing with normal vital signs.  Her abdomen is soft with diffuse tenderness in the epigastric and right quadrants, no rebound or guarding, negative Murphy sign.  She was seen here yesterday with labs show a leukocytosis, normal CMP and lipase, negative UA and pregnancy test.  I will repeat labs today and treat patient with IV Haldol, fluids and Zofran.  Differential diagnosis including gastroparesis versus gallbladder versus pancreatitis versus gastritis versus peptic ulcer disease versus ovarian pathology versus appendicitis.  Clinical Course as of Mar 23 1123  Sat Mar 22, 2018  1122 She feels markedly improved, no longer vomiting, tolerating p.o.  Pain has resolved.  Abdomen is soft with no tenderness at this time.  White count is trending down and is currently 16.4 which seems to be baseline  for patient.  I discussed very strict return precautions for right lower quadrant pain or fever but at this time low suspicion for appendicitis and therefore we will hold off CAT scan.   [CV]    Clinical Course User Index [CV] Alfred Levins [place], MD     As part of my medical decision making, I reviewed the following data within the [place] notes reviewed and incorporated, Labs reviewed , Old chart reviewed, Notes from prior ED visits and [place] Controlled Substance Database    Pertinent labs & imaging results that were available during my care of the patient were reviewed by me and considered in my medical decision making (see chart for details).    ____________________________________________   FINAL CLINICAL IMPRESSION(S) / ED DIAGNOSES  Final diagnoses:  Gastroparesis  Non-intractable vomiting with nausea, unspecified vomiting type      NEW MEDICATIONS STARTED DURING THIS VISIT:  ED Discharge Orders         Ordered    ondansetron (ZOFRAN ODT) 4 MG disintegrating tablet  Every 8 hours PRN     03/22/18 1123           Note:  This document was prepared using Dragon voice recognition software and may include unintentional dictation errors.    Alfred Levins, [place], MD 03/22/18 1124

## 2018-03-22 NOTE — ED Triage Notes (Signed)
C/O vomiting.  Seen through ED last night and returns because emesis continues and unable to keep 'anything' down.

## 2018-03-22 NOTE — Discharge Instructions (Addendum)

## 2018-05-02 ENCOUNTER — Other Ambulatory Visit: Payer: Self-pay

## 2018-05-02 ENCOUNTER — Encounter: Payer: Self-pay | Admitting: Emergency Medicine

## 2018-05-02 ENCOUNTER — Emergency Department
Admission: EM | Admit: 2018-05-02 | Discharge: 2018-05-02 | Disposition: A | Discharge status: Home / Self Care | Payer: Self-pay | Attending: Emergency Medicine | Admitting: Emergency Medicine

## 2018-05-02 DIAGNOSIS — F4320 Adjustment disorder, unspecified: Secondary | ICD-10-CM

## 2018-05-02 DIAGNOSIS — R1013 Epigastric pain: Secondary | ICD-10-CM | POA: Insufficient documentation

## 2018-05-02 DIAGNOSIS — Z79899 Other long term (current) drug therapy: Secondary | ICD-10-CM | POA: Insufficient documentation

## 2018-05-02 DIAGNOSIS — F17210 Nicotine dependence, cigarettes, uncomplicated: Secondary | ICD-10-CM | POA: Insufficient documentation

## 2018-05-02 DIAGNOSIS — K219 Gastro-esophageal reflux disease without esophagitis: Secondary | ICD-10-CM

## 2018-05-02 DIAGNOSIS — F419 Anxiety disorder, unspecified: Secondary | ICD-10-CM

## 2018-05-02 DIAGNOSIS — I10 Essential (primary) hypertension: Secondary | ICD-10-CM | POA: Insufficient documentation

## 2018-05-02 LAB — COMPREHENSIVE METABOLIC PANEL
ALT: 23 U/L (ref 0–44)
AST: 24 U/L (ref 15–41)
Albumin: 4.4 g/dL (ref 3.5–5.0)
Alkaline Phosphatase: 83 U/L (ref 38–126)
Anion gap: 8 (ref 5–15)
BUN: 8 mg/dL (ref 6–20)
CHLORIDE: 109 mmol/L (ref 98–111)
CO2: 24 mmol/L (ref 22–32)
Calcium: 8.9 mg/dL (ref 8.9–10.3)
Creatinine, Ser: 0.67 mg/dL (ref 0.44–1.00)
Glucose, Bld: 117 mg/dL — ABNORMAL HIGH (ref 70–99)
POTASSIUM: 3.2 mmol/L — AB (ref 3.5–5.1)
Sodium: 141 mmol/L (ref 135–145)
Total Bilirubin: 1 mg/dL (ref 0.3–1.2)
Total Protein: 7.6 g/dL (ref 6.5–8.1)

## 2018-05-02 LAB — CBC
HEMATOCRIT: 43.9 % (ref 36.0–46.0)
HEMOGLOBIN: 14.6 g/dL (ref 12.0–15.0)
MCH: 29.4 pg (ref 26.0–34.0)
MCHC: 33.3 g/dL (ref 30.0–36.0)
MCV: 88.3 fL (ref 80.0–100.0)
NRBC: 0 % (ref 0.0–0.2)
Platelets: 326 10*3/uL (ref 150–400)
RBC: 4.97 MIL/uL (ref 3.87–5.11)
RDW: 12.8 % (ref 11.5–15.5)
WBC: 10.7 10*3/uL — ABNORMAL HIGH (ref 4.0–10.5)

## 2018-05-02 LAB — URINALYSIS, COMPLETE (UACMP) WITH MICROSCOPIC
Bilirubin Urine: NEGATIVE
GLUCOSE, UA: NEGATIVE mg/dL
Ketones, ur: NEGATIVE mg/dL
Leukocytes, UA: NEGATIVE
Nitrite: NEGATIVE
PROTEIN: 30 mg/dL — AB
Specific Gravity, Urine: 1.027 (ref 1.005–1.030)
pH: 6 (ref 5.0–8.0)

## 2018-05-02 LAB — POCT PREGNANCY, URINE: PREG TEST UR: NEGATIVE

## 2018-05-02 LAB — TROPONIN I

## 2018-05-02 LAB — LIPASE, BLOOD: LIPASE: 32 U/L (ref 11–51)

## 2018-05-02 MED ORDER — GI COCKTAIL ~~LOC~~
30.0000 mL | Freq: Once | ORAL | Status: AC
  Administered 2018-05-02: 30 mL via ORAL
  Filled 2018-05-02: qty 30

## 2018-05-02 NOTE — ED Notes (Signed)
Pt reported to Dr. Burlene Arnt that she does not feel safe to go home RN contacted [facility] of [facility] [phone] representative speaking with pt RN provided number to pt

## 2018-05-02 NOTE — ED Notes (Signed)
ED Provider at bedside. 

## 2018-05-02 NOTE — ED Notes (Signed)
Pt reports took an [place] juice earlier around 4pm reports feeling burning sensation to epigastric area reports took a protonix but is not helping, reports regurgitation to esophagus reports" acid coming up in my throat" pt reports some nausea denies any episodes of emesis

## 2018-05-02 NOTE — ED Notes (Signed)
Pt talking to friend over the phone reports he is coming from [place] to pick her up, pt reports [facility] representative informed her shelter is full and is looking in other counties to see if there is any availability.
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
## 2018-05-02 NOTE — ED Notes (Signed)
Pt verbalizes understanding of discharge instructions.
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
## 2018-05-02 NOTE — ED Triage Notes (Signed)
Pt presents to ED with epigastric abd burning pain. Pt states onset was this evening right after drinking juice. Pt states she feels the acid in her throat but took Protonix with no relief. Hx of the same.

## 2018-05-06 ENCOUNTER — Other Ambulatory Visit: Payer: Self-pay | Admitting: Nurse Practitioner

## 2018-05-06 DIAGNOSIS — K219 Gastro-esophageal reflux disease without esophagitis: Secondary | ICD-10-CM

## 2018-05-10 ENCOUNTER — Emergency Department
Admission: EM | Admit: 2018-05-10 | Discharge: 2018-05-10 | Disposition: A | Discharge status: Home / Self Care | Payer: No Typology Code available for payment source | Attending: Student in an Organized Health Care Education/Training Program | Admitting: Student in an Organized Health Care Education/Training Program

## 2018-05-10 ENCOUNTER — Other Ambulatory Visit: Payer: Self-pay

## 2018-05-10 ENCOUNTER — Encounter: Payer: Self-pay | Admitting: Emergency Medicine

## 2018-05-10 DIAGNOSIS — M7918 Myalgia, other site: Secondary | ICD-10-CM | POA: Diagnosis not present

## 2018-05-10 DIAGNOSIS — Z79899 Other long term (current) drug therapy: Secondary | ICD-10-CM | POA: Diagnosis not present

## 2018-05-10 DIAGNOSIS — F17210 Nicotine dependence, cigarettes, uncomplicated: Secondary | ICD-10-CM | POA: Insufficient documentation

## 2018-05-10 DIAGNOSIS — I10 Essential (primary) hypertension: Secondary | ICD-10-CM | POA: Diagnosis not present

## 2018-05-10 DIAGNOSIS — M542 Cervicalgia: Secondary | ICD-10-CM | POA: Diagnosis present

## 2018-05-10 MED ORDER — CYCLOBENZAPRINE HCL 10 MG PO TABS
10.0000 mg | ORAL_TABLET | Freq: Once | ORAL | Status: AC
  Administered 2018-05-10: 10 mg via ORAL
  Filled 2018-05-10: qty 1

## 2018-05-10 MED ORDER — PREDNISONE 10 MG PO TABS: 10.0000 mg | ORAL_TABLET | Freq: Two times a day (BID) | ORAL | 0 refills | Status: DC

## 2018-05-10 MED ORDER — CYCLOBENZAPRINE HCL 5 MG PO TABS: 5.0000 mg | ORAL_TABLET | Freq: Three times a day (TID) | ORAL | 0 refills | Status: DC | PRN

## 2018-05-10 MED ORDER — KETOROLAC TROMETHAMINE 30 MG/ML IJ SOLN
30.0000 mg | Freq: Once | INTRAMUSCULAR | Status: AC
  Administered 2018-05-10: 30 mg via INTRAMUSCULAR
  Filled 2018-05-10: qty 1
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
## 2018-08-13 ENCOUNTER — Emergency Department: Payer: Self-pay

## 2018-08-13 ENCOUNTER — Emergency Department
Admission: EM | Admit: 2018-08-13 | Discharge: 2018-08-13 | Disposition: A | Discharge status: Home / Self Care | Payer: Self-pay | Attending: Student in an Organized Health Care Education/Training Program | Admitting: Student in an Organized Health Care Education/Training Program

## 2018-08-13 ENCOUNTER — Encounter: Payer: Self-pay | Admitting: Emergency Medicine

## 2018-08-13 DIAGNOSIS — Z79899 Other long term (current) drug therapy: Secondary | ICD-10-CM | POA: Insufficient documentation

## 2018-08-13 DIAGNOSIS — I10 Essential (primary) hypertension: Secondary | ICD-10-CM | POA: Insufficient documentation

## 2018-08-13 DIAGNOSIS — R102 Pelvic and perineal pain: Secondary | ICD-10-CM

## 2018-08-13 DIAGNOSIS — N898 Other specified noninflammatory disorders of vagina: Secondary | ICD-10-CM

## 2018-08-13 DIAGNOSIS — F17210 Nicotine dependence, cigarettes, uncomplicated: Secondary | ICD-10-CM | POA: Insufficient documentation

## 2018-08-13 LAB — URINALYSIS, COMPLETE (UACMP) WITH MICROSCOPIC
BILIRUBIN URINE: NEGATIVE
GLUCOSE, UA: NEGATIVE mg/dL
Ketones, ur: NEGATIVE mg/dL
Nitrite: NEGATIVE
PROTEIN: NEGATIVE mg/dL
Specific Gravity, Urine: 1.017 (ref 1.005–1.030)
pH: 6 (ref 5.0–8.0)

## 2018-08-13 LAB — CBC
HEMATOCRIT: 46.2 % — AB (ref 36.0–46.0)
Hemoglobin: 15.2 g/dL — ABNORMAL HIGH (ref 12.0–15.0)
MCH: 29.2 pg (ref 26.0–34.0)
MCHC: 32.9 g/dL (ref 30.0–36.0)
MCV: 88.7 fL (ref 80.0–100.0)
NRBC: 0 % (ref 0.0–0.2)
PLATELETS: 347 10*3/uL (ref 150–400)
RBC: 5.21 MIL/uL — ABNORMAL HIGH (ref 3.87–5.11)
RDW: 12.9 % (ref 11.5–15.5)
WBC: 10.8 10*3/uL — AB (ref 4.0–10.5)

## 2018-08-13 LAB — COMPREHENSIVE METABOLIC PANEL
ALK PHOS: 109 U/L (ref 38–126)
ALT: 34 U/L (ref 0–44)
AST: 26 U/L (ref 15–41)
Albumin: 4.6 g/dL (ref 3.5–5.0)
Anion gap: 7 (ref 5–15)
BUN: 11 mg/dL (ref 6–20)
CALCIUM: 8.9 mg/dL (ref 8.9–10.3)
CHLORIDE: 107 mmol/L (ref 98–111)
CO2: 24 mmol/L (ref 22–32)
CREATININE: 0.62 mg/dL (ref 0.44–1.00)
Glucose, Bld: 91 mg/dL (ref 70–99)
Potassium: 3.9 mmol/L (ref 3.5–5.1)
Sodium: 138 mmol/L (ref 135–145)
Total Bilirubin: 0.7 mg/dL (ref 0.3–1.2)
Total Protein: 7.7 g/dL (ref 6.5–8.1)

## 2018-08-13 LAB — WET PREP, GENITAL
Clue Cells Wet Prep HPF POC: NONE SEEN
SPERM: NONE SEEN
Trich, Wet Prep: NONE SEEN
Yeast Wet Prep HPF POC: NONE SEEN

## 2018-08-13 LAB — PREGNANCY, URINE: Preg Test, Ur: NEGATIVE

## 2018-08-13 LAB — CHLAMYDIA/NGC RT PCR (ARMC ONLY)
CHLAMYDIA TR: NOT DETECTED
N GONORRHOEAE: NOT DETECTED

## 2018-08-13 LAB — LIPASE, BLOOD: Lipase: 25 U/L (ref 11–51)

## 2018-08-13 MED ORDER — AZITHROMYCIN 500 MG PO TABS
1000.0000 mg | ORAL_TABLET | Freq: Once | ORAL | Status: AC
  Administered 2018-08-13: 1000 mg via ORAL
  Filled 2018-08-13: qty 2

## 2018-08-13 MED ORDER — DOXYCYCLINE HYCLATE 100 MG PO TABS: 100.0000 mg | ORAL_TABLET | Freq: Two times a day (BID) | ORAL | 0 refills | Status: AC

## 2018-08-13 MED ORDER — HYDROCODONE-ACETAMINOPHEN 5-325 MG PO TABS
1.0000 | ORAL_TABLET | Freq: Once | ORAL | Status: AC
  Administered 2018-08-13: 1 via ORAL
  Filled 2018-08-13: qty 1

## 2018-08-13 MED ORDER — CEFTRIAXONE SODIUM 250 MG IJ SOLR
250.0000 mg | Freq: Once | INTRAMUSCULAR | Status: AC
  Administered 2018-08-13: 250 mg via INTRAMUSCULAR
  Filled 2018-08-13: qty 250

## 2018-08-13 NOTE — ED Notes (Signed)
POC Preg not ran - Dr Quentin Cornwall aware and VO for urine preg

## 2018-08-13 NOTE — ED Provider Notes (Signed)
[facility] Emergency Department Provider Note    First MD Initiated Contact with Patient 08/13/18 1414     (approximate)  I have reviewed the triage vital signs and the nursing notes.   HISTORY  Chief Complaint Abdominal Pain    HPI Jasmine Mason is a 33 y.o. female presents the ER for 3 to 4 days of pelvic discomfort as well as vaginal discharge which is irregular for her.  States she is sexually active.  Uncertain of pregnancy status.  Denies any dysuria.  States the pain is mild to moderate and constant.  Is primarily on the right side.  Denies any nausea vomiting or diarrhea.  No chest pain or shortness of breath.    Past Medical History:  Diagnosis Date  . Back pain   . Back pain   . GERD (gastroesophageal reflux disease)   . Hypertension   . Mesenteric adenitis   . Obesity   . PCOS (polycystic ovarian syndrome)   . Tobacco abuse    Family History  Problem Relation Age of Onset  . Hypertension Mother   . Hypertension Father   . Diabetes Father   . Heart disease Father   . Hypertension Sister   . Throat cancer Maternal Grandfather   . Stroke Paternal Grandmother   . Diabetes Paternal Grandmother   . Ovarian cancer Neg Hx   . Colon cancer Neg Hx   . Breast cancer Neg Hx    Past Surgical History:  Procedure Laterality Date  . CARPAL TUNNEL RELEASE Left ~ 2013  . COLONOSCOPY WITH PROPOFOL N/A 08/08/2017   Procedure: COLONOSCOPY WITH PROPOFOL;  Surgeon: Jonathon Bellows, MD;  Location: [facility] ENDOSCOPY;  Service: Gastroenterology;  Laterality: N/A;  . ESOPHAGOGASTRODUODENOSCOPY N/A 01/25/2015   Procedure: ESOPHAGOGASTRODUODENOSCOPY (EGD);  Surgeon: Irene Shipper, MD;  Location: [facility] ENDOSCOPY;  Service: Endoscopy;  Laterality: N/A;  . ESOPHAGOGASTRODUODENOSCOPY (EGD) WITH PROPOFOL N/A 03/13/2016   Procedure: ESOPHAGOGASTRODUODENOSCOPY (EGD) WITH PROPOFOL;  Surgeon: Lucilla Lame, MD;  Location: [facility] ENDOSCOPY;  Service: Endoscopy;  Laterality: N/A;  .  ESOPHAGOGASTRODUODENOSCOPY (EGD) WITH PROPOFOL N/A 07/05/2017   Procedure: ESOPHAGOGASTRODUODENOSCOPY (EGD) WITH PROPOFOL;  Surgeon: Jonathon Bellows, MD;  Location: [facility] ENDOSCOPY;  Service: Gastroenterology;  Laterality: N/A;  . FRACTURE SURGERY    . ORIF ANKLE FRACTURE Left 1990's   Patient Active Problem List   Diagnosis Date Noted  . 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 Encounter for smoking cessation counseling 04/18/2017  . Essential hypertension 04/18/2017  . Epigastric abdominal pain   . Acute gastritis 09/20/2016  . Nausea with vomiting   . Hiatal hernia   . Uncontrollable vomiting 03/10/2016  . Abdominal pain, epigastric   . GERD (gastroesophageal reflux disease) 01/24/2015  . Hypokalemia 01/24/2015  . Abdominal pain 01/24/2015  . Leukocytosis 01/24/2015  . Chest pain 01/24/2015  . Cough 01/24/2015  . Pain of left calf 01/24/2015  . Elevated troponin 01/24/2015  . Obesity   . Mesenteric adenitis   . Tobacco abuse       Prior to Admission medications   Medication Sig Start Date End Date Taking? Authorizing Provider  Adapalene 0.3 % gel APPLY A THIN LAYER TO FACE QHS AND [place] OFF QAM 05/29/17   [provider]  alum & mag hydroxide-simeth (MAALOX MAX) [phone] MG/5ML suspension Take 5 mLs by mouth every 6 (six) hours as needed for indigestion.    [provider]  cyclobenzaprine (FLEXERIL) 5 MG tablet Take 1 tablet (5 mg total) by mouth 3 (  three) times daily as needed for muscle spasms. 05/10/18   Menshew, Dannielle Karvonen, PA-C  dicyclomine (BENTYL) 10 MG capsule Take 1 capsule (10 mg total) by mouth 4 (four) times daily -  before meals and at bedtime. 08/05/17   Jonathon Bellows, MD  docusate sodium (COLACE) 100 MG capsule Take 1 capsule (100 mg total) by mouth 2 (two) times daily. 06/27/17   Mikey College, NP  doxycycline (VIBRA-TABS) 100 MG tablet Take 1 tablet (100 mg total) by mouth 2 (two) times daily for 7 days. 08/13/18 08/20/18  Merlyn Lot,  MD  fluticasone (FLONASE) 50 MCG/ACT nasal spray Place 2 sprays into both nostrils daily. 06/27/17   Mikey College, NP  hydrochlorothiazide (HYDRODIURIL) 12.5 MG tablet TAKE 1 TABLET BY MOUTH ONCE DAILY 09/10/17   Mikey College, NP  HYDROcodone-acetaminophen (NORCO/VICODIN) 5-325 MG tablet Take 1-2 tablets by mouth every 6 (six) hours as needed. 09/27/14   [provider]  magic mouthwash w/lidocaine SOLN Take 5 mLs by mouth 4 (four) times daily. 01/21/18   Cuthriell, Charline Bills, PA-C  metoCLOPramide (REGLAN) 10 MG tablet Take 1 tablet (10 mg total) by mouth every 8 (eight) hours as needed for nausea or vomiting. 03/21/18 03/21/19  Orbie Pyo, MD  nicotine (NICODERM CQ - DOSED IN MG/24 HR) 7 mg/24hr patch Place 1 patch (7 mg total) onto the skin daily. 04/12/17   Mikey College, NP  norethindrone-ethinyl estradiol-iron (LOESTRIN FE 1.5/30) 1.5-30 MG-MCG tablet Take 1 tablet by mouth at bedtime for 28 days. 06/27/17 07/25/17  Mikey College, NP  ondansetron (ZOFRAN ODT) 4 MG disintegrating tablet Take 1 tablet (4 mg total) by mouth every 8 (eight) hours as needed for nausea or vomiting. 03/22/18   Alfred Levins, [place], MD  ondansetron (ZOFRAN) 4 MG tablet Take 1 tablet (4 mg total) by mouth every 8 (eight) hours as needed for nausea or vomiting. Patient not taking: Reported on 11/14/2017 07/24/17   Schuyler Amor, MD  pantoprazole (PROTONIX) 40 MG tablet TAKE 1 TABLET(40 MG) BY MOUTH TWICE DAILY 05/06/18   Mikey College, NP  predniSONE (DELTASONE) 10 MG tablet Take 1 tablet (10 mg total) by mouth 2 (two) times daily with a meal. 05/10/18   Menshew, Dannielle Karvonen, PA-C  promethazine (PHENERGAN) 12.5 MG tablet Take 1 tablet (12.5 mg total) by mouth every 6 (six) hours as needed for nausea or vomiting. 01/09/18   Merlyn Lot, MD  promethazine (PHENERGAN) 25 MG suppository Place 1 suppository (25 mg total) rectally every 6 (six) hours as needed for nausea.  11/14/17   Carrie Mew, MD    Allergies Patient has no known allergies.    Social History Social History   Tobacco Use  . Smoking status: Current Every Day Smoker    Packs/day: 0.25    Years: 10.00    Pack years: 2.50    Types: Cigarettes  . Smokeless tobacco: Never Used  Substance Use Topics  . Alcohol use: No    Alcohol/week: 0.0 standard drinks  . Drug use: No    Review of Systems Patient denies headaches, rhinorrhea, blurry vision, numbness, shortness of breath, chest pain, edema, cough, abdominal pain, nausea, vomiting, diarrhea, dysuria, fevers, rashes or hallucinations unless otherwise stated above in HPI. ____________________________________________   PHYSICAL EXAM:  VITAL SIGNS: Vitals:   08/13/18 1230  BP: (!) 150/93  Pulse: 93  Resp: 14  Temp: 98.5 F (36.9 C)  SpO2: 100%    Constitutional: Alert and oriented.  Eyes: Conjunctivae are normal.  Head: Atraumatic. Nose: No congestion/rhinnorhea. Mouth/Throat: Mucous membranes are moist.   Neck: No stridor. Painless ROM.  Cardiovascular: Normal rate, regular rhythm. Grossly normal heart sounds.  Good peripheral circulation. Respiratory: Normal respiratory effort.  No retractions. Lungs CTAB. Gastrointestinal: Soft and nontender in all four quadrants. No distention. No abdominal bruits. No CVA tenderness. Genitourinary: Patient with purulent discharge from the cervical office with what appears to be irritated cervical mucosa.  No obvious lesion.  Does have right adnexal tenderness to palpation without obvious mass.  Did have cervical motion tenderness. Musculoskeletal: No lower extremity tenderness nor edema.  No joint effusions. Neurologic:  Normal speech and language. No gross focal neurologic deficits are appreciated. No facial droop Skin:  Skin is warm, dry and intact. No rash noted. Psychiatric: Mood and affect are normal. Speech and behavior are  normal.  ____________________________________________   LABS (all labs ordered are listed, but only abnormal results are displayed)  Results for orders placed or performed during the hospital encounter of 08/13/18 (from the past 24 hour(s))  Lipase, blood     Status: None   Collection Time: 08/13/18 12:43 PM  Result Value Ref Range   Lipase 25 11 - 51 U/L  Comprehensive metabolic panel     Status: None   Collection Time: 08/13/18 12:43 PM  Result Value Ref Range   Sodium 138 135 - 145 mmol/L   Potassium 3.9 3.5 - 5.1 mmol/L   Chloride 107 98 - 111 mmol/L   CO2 24 22 - 32 mmol/L   Glucose, Bld 91 70 - 99 mg/dL   BUN 11 6 - 20 mg/dL   Creatinine, Ser 0.62 0.44 - 1.00 mg/dL   Calcium 8.9 8.9 - 10.3 mg/dL   Total Protein 7.7 6.5 - 8.1 g/dL   Albumin 4.6 3.5 - 5.0 g/dL   AST 26 15 - 41 U/L   ALT 34 0 - 44 U/L   Alkaline Phosphatase 109 38 - 126 U/L   Total Bilirubin 0.7 0.3 - 1.2 mg/dL   GFR calc non Af Amer >60 >60 mL/min   GFR calc Af Amer >60 >60 mL/min   Anion gap 7 5 - 15  CBC     Status: Abnormal   Collection Time: 08/13/18 12:43 PM  Result Value Ref Range   WBC 10.8 (H) 4.0 - 10.5 K/uL   RBC 5.21 (H) 3.87 - 5.11 MIL/uL   Hemoglobin 15.2 (H) 12.0 - 15.0 g/dL   HCT 46.2 (H) 36.0 - 46.0 %   MCV 88.7 80.0 - 100.0 fL   MCH 29.2 26.0 - 34.0 pg   MCHC 32.9 30.0 - 36.0 g/dL   RDW 12.9 11.5 - 15.5 %   Platelets 347 150 - 400 K/uL   nRBC 0.0 0.0 - 0.2 %  Urinalysis, Complete w Microscopic     Status: Abnormal   Collection Time: 08/13/18  2:43 PM  Result Value Ref Range   Color, Urine YELLOW (A) YELLOW   APPearance CLOUDY (A) CLEAR   Specific Gravity, Urine 1.017 1.005 - 1.030   pH 6.0 5.0 - 8.0   Glucose, UA NEGATIVE NEGATIVE mg/dL   Hgb urine dipstick MODERATE (A) NEGATIVE   Bilirubin Urine NEGATIVE NEGATIVE   Ketones, ur NEGATIVE NEGATIVE mg/dL   Protein, ur NEGATIVE NEGATIVE mg/dL   Nitrite NEGATIVE NEGATIVE   Leukocytes, UA LARGE (A) NEGATIVE   RBC / HPF 21-50 0  - 5 RBC/hpf   WBC, UA 21-50 0 -  5 WBC/hpf   Bacteria, UA RARE (A) NONE SEEN   Squamous Epithelial / LPF 11-20 0 - 5   Mucus PRESENT   Pregnancy, urine     Status: None   Collection Time: 2018-08-19  2:43 PM  Result Value Ref Range   Preg Test, Ur NEGATIVE NEGATIVE  Wet prep, genital     Status: Abnormal   Collection Time: 08-19-18  2:44 PM  Result Value Ref Range   Yeast Wet Prep HPF POC NONE SEEN NONE SEEN   Trich, Wet Prep NONE SEEN NONE SEEN   Clue Cells Wet Prep HPF POC NONE SEEN NONE SEEN   WBC, Wet Prep HPF POC MANY (A) NONE SEEN   Sperm NONE SEEN   Chlamydia/NGC rt PCR ([facility] only)     Status: None   Collection Time: 2018-08-19  2:44 PM  Result Value Ref Range   Specimen source GC/Chlam ENDOCERVICAL    Chlamydia Tr NOT DETECTED NOT DETECTED   N gonorrhoeae NOT DETECTED NOT DETECTED   ____________________________________________  EKG ____________________________________________  RADIOLOGY I personally reviewed all radiographic images ordered to evaluate for the above acute complaints and reviewed radiology reports and findings.  These findings were personally discussed with the patient.  Please see medical record for radiology report.  ____________________________________________   PROCEDURES  Procedure(s) performed:  Procedures    Critical Care performed: no ____________________________________________   INITIAL IMPRESSION / ASSESSMENT AND PLAN / ED COURSE  Pertinent labs & imaging results that were available during my care of the patient were reviewed by me and considered in my medical decision making (see chart for details).   DDX: std, bv, preg, uti, ectopic, doubt toa, torsion, or appy  Jasmine Mason is a 33 y.o. who presents to the ED with symptoms as described above.  Patient afebrile hemodynamically stable.  Based on her presentation and will perform pelvic exam to evaluate for PID and the above differential.  Her abdominal exam is otherwise benign.   Doubt acute intra-abdominal process.  Clinical Course as of August 20, 1631  Wed August 19, 2018 Patient did have pronounced cervical motion tenderness with purulence from the cervical loss.  Will send wet prep and GC cc but will treat empirically for PID.   [PR]  2706 No evidence of TOA on ultrasound no evidence of torsion or cyst.  Will continue with plan for empiric treatment of PID based on her presentation discharge and exam.  Not clinically consistent with acute appendicitis stone.  Doubt cystitis.  She stable and appropriate for outpatient follow-up.   [PR]    Clinical Course User Index [PR] Merlyn Lot, MD     As part of my medical decision making, I reviewed the following data within the [place] notes reviewed and incorporated, Labs reviewed, notes from prior ED visits and [place] Controlled Substance Database   ____________________________________________   FINAL CLINICAL IMPRESSION(S) / ED DIAGNOSES  Final diagnoses:  Pelvic pain  Vaginal discharge      NEW MEDICATIONS STARTED DURING THIS VISIT:  New Prescriptions   DOXYCYCLINE (VIBRA-TABS) 100 MG TABLET    Take 1 tablet (100 mg total) by mouth 2 (two) times daily for 7 days.     Note:  This document was prepared using Dragon voice recognition software and may include unintentional dictation errors.    Merlyn Lot, MD 2018/08/19 [phone]

## 2018-08-13 NOTE — Discharge Instructions (Addendum)

## 2018-08-13 NOTE — ED Triage Notes (Signed)
Patient presents to the ED with right lower quadrant pain radiating into pelvis that began yesterday.  Patient reports vomiting x 1.  Denies diarrhea. Patient states, "I have acid reflux so throwing up is kind of normal for me."  Patient is in no obvious distress at this time.

## 2018-08-13 NOTE — ED Notes (Signed)
Pt c/o RLQ abd pain that radiates into groin x2 days and vaginal discharge that is milky white with "funny smell" x1 week

## 2018-09-05 ENCOUNTER — Encounter: Payer: Self-pay | Admitting: Emergency Medicine

## 2018-09-05 ENCOUNTER — Emergency Department
Admission: EM | Admit: 2018-09-05 | Discharge: 2018-09-05 | Disposition: A | Discharge status: Home / Self Care | Payer: Self-pay | Attending: Emergency Medicine | Admitting: Emergency Medicine

## 2018-09-05 ENCOUNTER — Other Ambulatory Visit: Payer: Self-pay

## 2018-09-05 ENCOUNTER — Emergency Department: Payer: Self-pay

## 2018-09-05 DIAGNOSIS — A599 Trichomoniasis, unspecified: Secondary | ICD-10-CM | POA: Insufficient documentation

## 2018-09-05 DIAGNOSIS — Z79899 Other long term (current) drug therapy: Secondary | ICD-10-CM | POA: Insufficient documentation

## 2018-09-05 DIAGNOSIS — R1031 Right lower quadrant pain: Secondary | ICD-10-CM | POA: Insufficient documentation

## 2018-09-05 DIAGNOSIS — I10 Essential (primary) hypertension: Secondary | ICD-10-CM | POA: Insufficient documentation

## 2018-09-05 DIAGNOSIS — F17210 Nicotine dependence, cigarettes, uncomplicated: Secondary | ICD-10-CM | POA: Insufficient documentation

## 2018-09-05 LAB — WET PREP, GENITAL
Clue Cells Wet Prep HPF POC: NONE SEEN
Sperm: NONE SEEN
Yeast Wet Prep HPF POC: NONE SEEN

## 2018-09-05 LAB — CHLAMYDIA/NGC RT PCR (ARMC ONLY)
Chlamydia Tr: NOT DETECTED
N gonorrhoeae: NOT DETECTED

## 2018-09-05 LAB — COMPREHENSIVE METABOLIC PANEL WITH GFR
ALT: 19 U/L (ref 0–44)
AST: 26 U/L (ref 15–41)
Albumin: 4.1 g/dL (ref 3.5–5.0)
Alkaline Phosphatase: 86 U/L (ref 38–126)
Anion gap: 6 (ref 5–15)
BUN: 11 mg/dL (ref 6–20)
CO2: 24 mmol/L (ref 22–32)
Calcium: 8.8 mg/dL — ABNORMAL LOW (ref 8.9–10.3)
Chloride: 108 mmol/L (ref 98–111)
Creatinine, Ser: 0.68 mg/dL (ref 0.44–1.00)
GFR calc Af Amer: >60 mL/min
GFR calc non Af Amer: >60 mL/min
Glucose, Bld: 113 mg/dL — ABNORMAL HIGH (ref 70–99)
Potassium: 3.6 mmol/L (ref 3.5–5.1)
Sodium: 138 mmol/L (ref 135–145)
Total Bilirubin: 0.7 mg/dL (ref 0.3–1.2)
Total Protein: 7.6 g/dL (ref 6.5–8.1)

## 2018-09-05 LAB — CBC
HCT: 43.6 % (ref 36.0–46.0)
Hemoglobin: 14.2 g/dL (ref 12.0–15.0)
MCH: 29.3 pg (ref 26.0–34.0)
MCHC: 32.6 g/dL (ref 30.0–36.0)
MCV: 90.1 fL (ref 80.0–100.0)
Platelets: 359 K/uL (ref 150–400)
RBC: 4.84 MIL/uL (ref 3.87–5.11)
RDW: 12.9 % (ref 11.5–15.5)
WBC: 12 K/uL — ABNORMAL HIGH (ref 4.0–10.5)
nRBC: 0 % (ref 0.0–0.2)

## 2018-09-05 LAB — URINALYSIS, COMPLETE (UACMP) WITH MICROSCOPIC
Bilirubin Urine: NEGATIVE
Glucose, UA: NEGATIVE mg/dL
Ketones, ur: NEGATIVE mg/dL
NITRITE: NEGATIVE
PROTEIN: NEGATIVE mg/dL
Specific Gravity, Urine: 1.028 (ref 1.005–1.030)
pH: 5 (ref 5.0–8.0)

## 2018-09-05 LAB — LIPASE, BLOOD: Lipase: 32 U/L (ref 11–51)

## 2018-09-05 LAB — POCT PREGNANCY, URINE: PREG TEST UR: NEGATIVE

## 2018-09-05 MED ORDER — IOPAMIDOL (ISOVUE-300) INJECTION 61%
100.0000 mL | Freq: Once | INTRAVENOUS | Status: AC | PRN
  Administered 2018-09-05: 100 mL via INTRAVENOUS

## 2018-09-05 MED ORDER — MORPHINE SULFATE (PF) 4 MG/ML IV SOLN
4.0000 mg | Freq: Once | INTRAVENOUS | Status: AC
  Administered 2018-09-05: 4 mg via INTRAVENOUS
  Filled 2018-09-05: qty 1

## 2018-09-05 MED ORDER — SODIUM CHLORIDE 0.9 % IV BOLUS
1000.0000 mL | Freq: Once | INTRAVENOUS | Status: AC
  Administered 2018-09-05: 1000 mL via INTRAVENOUS

## 2018-09-05 MED ORDER — METRONIDAZOLE 500 MG PO TABS: 500.0000 mg | ORAL_TABLET | Freq: Two times a day (BID) | ORAL | 0 refills | Status: AC

## 2018-09-05 MED ORDER — ONDANSETRON HCL 4 MG/2ML IJ SOLN
4.0000 mg | Freq: Once | INTRAMUSCULAR | Status: AC
  Administered 2018-09-05: 4 mg via INTRAVENOUS
  Filled 2018-09-05: qty 2

## 2018-09-05 MED ORDER — METRONIDAZOLE 500 MG PO TABS
500.0000 mg | ORAL_TABLET | Freq: Once | ORAL | Status: AC
  Administered 2018-09-05: 500 mg via ORAL
  Filled 2018-09-05: qty 1

## 2018-09-05 NOTE — ED Triage Notes (Signed)
Patient ambulatory to triage with steady gait, without difficulty or distress noted; pt reports right sided abd pain with no accomp symptoms x month; pt with hx chronic abd pain

## 2018-09-05 NOTE — ED Provider Notes (Signed)
[facility] Emergency Department Provider Note  ____________________________________________   First MD Initiated Contact with Patient 09/05/18 [phone]     (approximate)  I have reviewed the triage vital signs and the nursing notes.   HISTORY  Chief Complaint Abdominal Pain   HPI Jasmine Mason is a 33 y.o. female with a history of PCOS was presenting with several weeks of vaginal discharge as well as right lower quadrant abdominal pain.  Patient says that she is having sharp 8 out of 10 abdominal pain to the right lower quadrant without radiation.  She says that she was seen several weeks ago and treated for a "bacterial infection."  However, despite the pain going away initially the pain returned overnight while she was at work.  Denies any worsening the pain with movement, urinating.  Says that the discharge is gotten worse.  Denying any nausea, vomiting or diarrhea.  No history of abdominal surgeries.   Past Medical History:  Diagnosis Date  . Back pain   . Back pain   . GERD (gastroesophageal reflux disease)   . Hypertension   . Mesenteric adenitis   . Obesity   . PCOS (polycystic ovarian syndrome)   . Tobacco abuse     Patient Active Problem List   Diagnosis Date Noted  . 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 Encounter for smoking cessation counseling 04/18/2017  . Essential hypertension 04/18/2017  . Epigastric abdominal pain   . Acute gastritis 09/20/2016  . Nausea with vomiting   . Hiatal hernia   . Uncontrollable vomiting 03/10/2016  . Abdominal pain, epigastric   . GERD (gastroesophageal reflux disease) 01/24/2015  . Hypokalemia 01/24/2015  . Abdominal pain 01/24/2015  . Leukocytosis 01/24/2015  . Chest pain 01/24/2015  . Cough 01/24/2015  . Pain of left calf 01/24/2015  . Elevated troponin 01/24/2015  . Obesity   . Mesenteric adenitis   . Tobacco abuse     Past Surgical History:  Procedure Laterality Date  . CARPAL TUNNEL  RELEASE Left ~ 2013  . COLONOSCOPY WITH PROPOFOL N/A 08/08/2017   Procedure: COLONOSCOPY WITH PROPOFOL;  Surgeon: Jonathon Bellows, MD;  Location: [facility] ENDOSCOPY;  Service: Gastroenterology;  Laterality: N/A;  . ESOPHAGOGASTRODUODENOSCOPY N/A 01/25/2015   Procedure: ESOPHAGOGASTRODUODENOSCOPY (EGD);  Surgeon: Irene Shipper, MD;  Location: [facility] ENDOSCOPY;  Service: Endoscopy;  Laterality: N/A;  . ESOPHAGOGASTRODUODENOSCOPY (EGD) WITH PROPOFOL N/A 03/13/2016   Procedure: ESOPHAGOGASTRODUODENOSCOPY (EGD) WITH PROPOFOL;  Surgeon: Lucilla Lame, MD;  Location: [facility] ENDOSCOPY;  Service: Endoscopy;  Laterality: N/A;  . ESOPHAGOGASTRODUODENOSCOPY (EGD) WITH PROPOFOL N/A 07/05/2017   Procedure: ESOPHAGOGASTRODUODENOSCOPY (EGD) WITH PROPOFOL;  Surgeon: Jonathon Bellows, MD;  Location: [facility] ENDOSCOPY;  Service: Gastroenterology;  Laterality: N/A;  . FRACTURE SURGERY    . ORIF ANKLE FRACTURE Left 1990's    Prior to Admission medications   Medication Sig Start Date End Date Taking? Authorizing Provider  Adapalene 0.3 % gel APPLY A THIN LAYER TO FACE QHS AND [place] OFF QAM 05/29/17   [provider]  alum & mag hydroxide-simeth (MAALOX MAX) 562-130-86 MG/5ML suspension Take 5 mLs by mouth every 6 (six) hours as needed for indigestion.    [provider]  cyclobenzaprine (FLEXERIL) 5 MG tablet Take 1 tablet (5 mg total) by mouth 3 (three) times daily as needed for muscle spasms. 05/10/18   Menshew, Dannielle Karvonen, PA-C  dicyclomine (BENTYL) 10 MG capsule Take 1 capsule (10 mg total) by mouth 4 (four) times daily -  before meals and at  bedtime. 08/05/17   Jonathon Bellows, MD  docusate sodium (COLACE) 100 MG capsule Take 1 capsule (100 mg total) by mouth 2 (two) times daily. 06/27/17   Mikey College, NP  fluticasone (FLONASE) 50 MCG/ACT nasal spray Place 2 sprays into both nostrils daily. 06/27/17   Mikey College, NP  hydrochlorothiazide (HYDRODIURIL) 12.5 MG tablet TAKE 1 TABLET BY MOUTH ONCE DAILY 09/10/17    Mikey College, NP  HYDROcodone-acetaminophen (NORCO/VICODIN) 5-325 MG tablet Take 1-2 tablets by mouth every 6 (six) hours as needed. 09/27/14   [provider]  magic mouthwash w/lidocaine SOLN Take 5 mLs by mouth 4 (four) times daily. 01/21/18   Cuthriell, Charline Bills, PA-C  metoCLOPramide (REGLAN) 10 MG tablet Take 1 tablet (10 mg total) by mouth every 8 (eight) hours as needed for nausea or vomiting. 03/21/18 03/21/19  Orbie Pyo, MD  nicotine (NICODERM CQ - DOSED IN MG/24 HR) 7 mg/24hr patch Place 1 patch (7 mg total) onto the skin daily. 04/12/17   Mikey College, NP  norethindrone-ethinyl estradiol-iron (LOESTRIN FE 1.5/30) 1.5-30 MG-MCG tablet Take 1 tablet by mouth at bedtime for 28 days. 06/27/17 07/25/17  Mikey College, NP  ondansetron (ZOFRAN ODT) 4 MG disintegrating tablet Take 1 tablet (4 mg total) by mouth every 8 (eight) hours as needed for nausea or vomiting. 03/22/18   Alfred Levins, [place], MD  ondansetron (ZOFRAN) 4 MG tablet Take 1 tablet (4 mg total) by mouth every 8 (eight) hours as needed for nausea or vomiting. Patient not taking: Reported on 11/14/2017 07/24/17   Schuyler Amor, MD  pantoprazole (PROTONIX) 40 MG tablet TAKE 1 TABLET(40 MG) BY MOUTH TWICE DAILY 05/06/18   Mikey College, NP  predniSONE (DELTASONE) 10 MG tablet Take 1 tablet (10 mg total) by mouth 2 (two) times daily with a meal. 05/10/18   Menshew, Dannielle Karvonen, PA-C  promethazine (PHENERGAN) 12.5 MG tablet Take 1 tablet (12.5 mg total) by mouth every 6 (six) hours as needed for nausea or vomiting. 01/09/18   Merlyn Lot, MD  promethazine (PHENERGAN) 25 MG suppository Place 1 suppository (25 mg total) rectally every 6 (six) hours as needed for nausea. 11/14/17   Carrie Mew, MD    Allergies Patient has no known allergies.  Family History  Problem Relation Age of Onset  . Hypertension Mother   . Hypertension Father   . Diabetes Father   . Heart disease  Father   . Hypertension Sister   . Throat cancer Maternal Grandfather   . Stroke Paternal Grandmother   . Diabetes Paternal Grandmother   . Ovarian cancer Neg Hx   . Colon cancer Neg Hx   . Breast cancer Neg Hx     Social History Social History   Tobacco Use  . Smoking status: Current Every Day Smoker    Packs/day: 0.25    Years: 10.00    Pack years: 2.50    Types: Cigarettes  . Smokeless tobacco: Never Used  Substance Use Topics  . Alcohol use: No    Alcohol/week: 0.0 standard drinks  . Drug use: No    Review of Systems  Constitutional: No fever/chills Eyes: No visual changes. ENT: No sore throat. Cardiovascular: Denies chest pain. Respiratory: Denies shortness of breath. Gastrointestinal: As above no nausea, no vomiting.  No diarrhea.  No constipation. Genitourinary: As above Musculoskeletal: Negative for back pain. Skin: Negative for rash. Neurological: Negative for headaches, focal weakness or numbness.   ____________________________________________   PHYSICAL EXAM:  VITAL SIGNS: ED Triage Vitals  Enc Vitals Group     BP 09/05/18 0637 (!) 146/95     Pulse Rate 09/05/18 0637 95     Resp 09/05/18 0637 17     Temp 09/05/18 0637 97.9 F (36.6 C)     Temp Source 09/05/18 0637 Oral     SpO2 09/05/18 0637 99 %     Weight 09/05/18 0630 220 lb (99.8 kg)     Height 09/05/18 0630 5\' 7"  (1.702 m)     Head Circumference --      Peak Flow --      Pain Score 09/05/18 0630 8     Pain Loc --      Pain Edu? --      Excl. in [place]? --     Constitutional: Alert and oriented. Well appearing and in no acute distress. Eyes: Conjunctivae are normal.  Head: Atraumatic. Nose: No congestion/rhinnorhea. Mouth/Throat: Mucous membranes are moist.  Neck: No stridor.   Cardiovascular: Normal rate, regular rhythm. Grossly normal heart sounds.   Respiratory: Normal respiratory effort.  No retractions. Lungs CTAB. Gastrointestinal: Soft with mild to moderate right lower  quadrant tenderness to palpation without rebound or guarding.  Negative Murphy sign.  No distention.  Genitourinary: External exam without any lesions.  Speculum exam with a very minimal amount of cervical discharge but with a friable cervix.  Bimanual exam without CMT.  Mild uterine as well as right adnexal tenderness to palpation without any masses palpated. Musculoskeletal: No lower extremity tenderness nor edema.  No joint effusions. Neurologic:  Normal speech and language. No gross focal neurologic deficits are appreciated. Skin:  Skin is warm, dry and intact. No rash noted. Psychiatric: Mood and affect are normal. Speech and behavior are normal.  ____________________________________________   LABS (all labs ordered are listed, but only abnormal results are displayed)  Labs Reviewed  WET PREP, GENITAL - Abnormal; Notable for the following components:      Result Value   Trich, Wet Prep PRESENT (*)    WBC, Wet Prep HPF POC FEW (*)    All other components within normal limits  COMPREHENSIVE METABOLIC PANEL - Abnormal; Notable for the following components:   Glucose, Bld 113 (*)    Calcium 8.8 (*)    All other components within normal limits  CBC - Abnormal; Notable for the following components:   WBC 12.0 (*)    All other components within normal limits  URINALYSIS, COMPLETE (UACMP) WITH MICROSCOPIC - Abnormal; Notable for the following components:   Color, Urine YELLOW (*)    APPearance CLOUDY (*)    Hgb urine dipstick MODERATE (*)    Leukocytes,Ua LARGE (*)    RBC / HPF >50 (*)    Bacteria, UA RARE (*)    All other components within normal limits  CHLAMYDIA/NGC RT PCR ([facility] ONLY)  LIPASE, BLOOD  POC URINE PREG, ED  POCT PREGNANCY, URINE   ____________________________________________  EKG   ____________________________________________  RADIOLOGY  CT abdomen without acute  pathology. ____________________________________________   PROCEDURES  Procedure(s) performed:   Procedures  Critical Care performed:   ____________________________________________   INITIAL IMPRESSION / ASSESSMENT AND PLAN / ED COURSE  Pertinent labs & imaging results that were available during my care of the patient were reviewed by me and considered in my medical decision making (see chart for details).  Differential diagnosis includes, but is not limited to, ovarian cyst, ovarian torsion, acute appendicitis, diverticulitis, urinary tract infection/pyelonephritis, endometriosis, bowel obstruction,  colitis, renal colic, gastroenteritis, hernia, fibroids, endometriosis, pregnancy related pain including ectopic pregnancy, etc. As part of my medical decision making, I reviewed the following data within the [place] Notes from prior ED visits  ----------------------------------------- 9:28 AM on 09/05/2018 -----------------------------------------  Patient with wet prep positive for trichomonas.  Negative gonorrhea and chlamydia.  Recent course of antibiotics to treat for PID.  I will treat for the trichomonas.  The patient is aware of the diagnosis as well as to continue to be abstinent until she is fully tested and cleared by her primary care doctor.  Says that she also made her partner aware, who she says she has not been sexually active with since this past October.  She is understanding of the diagnosis well treatment and willing to comply.  Understands have further testing with her primary care doctor for other STDs such as HIV, syphilis and herpes. ____________________________________________   FINAL CLINICAL IMPRESSION(S) / ED DIAGNOSES  Trichomonas.  NEW MEDICATIONS STARTED DURING THIS VISIT:  New Prescriptions   No medications on file     Note:  This document was prepared using Dragon voice recognition software and may include unintentional  dictation errors.     Orbie Pyo, MD 09/05/18 [phone]

## 2018-09-26 ENCOUNTER — Emergency Department
Admission: EM | Admit: 2018-09-26 | Discharge: 2018-09-26 | Disposition: A | Discharge status: Home / Self Care | Payer: Self-pay | Attending: Emergency Medicine | Admitting: Emergency Medicine

## 2018-09-26 ENCOUNTER — Encounter: Payer: Self-pay | Admitting: *Deleted

## 2018-09-26 ENCOUNTER — Other Ambulatory Visit: Payer: Self-pay

## 2018-09-26 DIAGNOSIS — Z79899 Other long term (current) drug therapy: Secondary | ICD-10-CM | POA: Insufficient documentation

## 2018-09-26 DIAGNOSIS — I10 Essential (primary) hypertension: Secondary | ICD-10-CM | POA: Insufficient documentation

## 2018-09-26 DIAGNOSIS — T781XXA Other adverse food reactions, not elsewhere classified, initial encounter: Secondary | ICD-10-CM | POA: Insufficient documentation

## 2018-09-26 DIAGNOSIS — F17210 Nicotine dependence, cigarettes, uncomplicated: Secondary | ICD-10-CM | POA: Insufficient documentation

## 2018-09-26 MED ORDER — FAMOTIDINE 20 MG PO TABS: 20.0000 mg | ORAL_TABLET | Freq: Every day | ORAL | 0 refills | Status: DC

## 2018-09-26 MED ORDER — PREDNISONE 50 MG PO TABS: 50.0000 mg | ORAL_TABLET | Freq: Every day | ORAL | 0 refills | Status: DC

## 2018-09-26 MED ORDER — DIPHENHYDRAMINE HCL 50 MG/ML IJ SOLN
50.0000 mg | Freq: Once | INTRAMUSCULAR | Status: AC
  Administered 2018-09-26: 50 mg via INTRAMUSCULAR
  Filled 2018-09-26: qty 1

## 2018-09-26 MED ORDER — DEXAMETHASONE SODIUM PHOSPHATE 10 MG/ML IJ SOLN
10.0000 mg | Freq: Once | INTRAMUSCULAR | Status: AC
  Administered 2018-09-26: 10 mg via INTRAMUSCULAR
  Filled 2018-09-26: qty 1

## 2018-09-26 MED ORDER — FAMOTIDINE 20 MG PO TABS
20.0000 mg | ORAL_TABLET | Freq: Once | ORAL | Status: AC
  Administered 2018-09-26: 20 mg via ORAL
  Filled 2018-09-26: qty 1

## 2018-09-26 NOTE — ED Triage Notes (Signed)
Pt to ED reporting she was exposed to onions at lunch today and has now developed a rash on both her arms with itching. Pt reports she has had similar reactions when exposed to onions in the past.   PT also reporting her GERD has flared up since eating them which she verbalized has also happened in the past. No medications taken at home prior to arrival.

## 2018-09-26 NOTE — ED Provider Notes (Signed)
[facility] Emergency Department Provider Note  ____________________________________________  Time seen: Approximately 8:32 PM  I have reviewed the triage vital signs and the nursing notes.   HISTORY  Chief Complaint Rash    HPI Jasmine Mason is a 33 y.o. female who presents the emergency department complaining of rash developed after eating onions.  Patient reports that she has had this reaction after eating onions in the past.  She denies any swelling or difficulty breathing of the airway but she does endorse some increased GERD symptoms with heartburn.  Patient also states that this is consistent with previous exposures to onion.  Patient has not taken any medication prior to arrival.  She denies any oropharyngeal edema, difficulty breathing or swallowing, chest pain, shortness of breath, domino pain, nausea or vomiting.    Past Medical History:  Diagnosis Date  . Back pain   . Back pain   . GERD (gastroesophageal reflux disease)   . Hypertension   . Mesenteric adenitis   . Obesity   . PCOS (polycystic ovarian syndrome)   . Tobacco abuse     Patient Active Problem List   Diagnosis Date Noted  . 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 Encounter for smoking cessation counseling 04/18/2017  . Essential hypertension 04/18/2017  . Epigastric abdominal pain   . Acute gastritis 09/20/2016  . Nausea with vomiting   . Hiatal hernia   . Uncontrollable vomiting 03/10/2016  . Abdominal pain, epigastric   . GERD (gastroesophageal reflux disease) 01/24/2015  . Hypokalemia 01/24/2015  . Abdominal pain 01/24/2015  . Leukocytosis 01/24/2015  . Chest pain 01/24/2015  . Cough 01/24/2015  . Pain of left calf 01/24/2015  . Elevated troponin 01/24/2015  . Obesity   . Mesenteric adenitis   . Tobacco abuse     Past Surgical History:  Procedure Laterality Date  . CARPAL TUNNEL RELEASE Left ~ 2013  . COLONOSCOPY WITH PROPOFOL N/A 08/08/2017   Procedure:  COLONOSCOPY WITH PROPOFOL;  Surgeon: Jonathon Bellows, MD;  Location: [facility] ENDOSCOPY;  Service: Gastroenterology;  Laterality: N/A;  . ESOPHAGOGASTRODUODENOSCOPY N/A 01/25/2015   Procedure: ESOPHAGOGASTRODUODENOSCOPY (EGD);  Surgeon: Irene Shipper, MD;  Location: [facility] ENDOSCOPY;  Service: Endoscopy;  Laterality: N/A;  . ESOPHAGOGASTRODUODENOSCOPY (EGD) WITH PROPOFOL N/A 03/13/2016   Procedure: ESOPHAGOGASTRODUODENOSCOPY (EGD) WITH PROPOFOL;  Surgeon: Lucilla Lame, MD;  Location: [facility] ENDOSCOPY;  Service: Endoscopy;  Laterality: N/A;  . ESOPHAGOGASTRODUODENOSCOPY (EGD) WITH PROPOFOL N/A 07/05/2017   Procedure: ESOPHAGOGASTRODUODENOSCOPY (EGD) WITH PROPOFOL;  Surgeon: Jonathon Bellows, MD;  Location: [facility] ENDOSCOPY;  Service: Gastroenterology;  Laterality: N/A;  . FRACTURE SURGERY    . ORIF ANKLE FRACTURE Left 1990's    Prior to Admission medications   Medication Sig Start Date End Date Taking? Authorizing Provider  Adapalene 0.3 % gel APPLY A THIN LAYER TO FACE QHS AND [place] OFF QAM 05/29/17   [provider]  alum & mag hydroxide-simeth (MAALOX MAX) 163-846-65 MG/5ML suspension Take 5 mLs by mouth every 6 (six) hours as needed for indigestion.    [provider]  cyclobenzaprine (FLEXERIL) 5 MG tablet Take 1 tablet (5 mg total) by mouth 3 (three) times daily as needed for muscle spasms. 05/10/18   Menshew, Dannielle Karvonen, PA-C  dicyclomine (BENTYL) 10 MG capsule Take 1 capsule (10 mg total) by mouth 4 (four) times daily -  before meals and at bedtime. 08/05/17   Jonathon Bellows, MD  docusate sodium (COLACE) 100 MG capsule Take 1 capsule (100 mg total) by mouth 2 (  two) times daily. 06/27/17   Mikey College, NP  famotidine (PEPCID) 20 MG tablet Take 1 tablet (20 mg total) by mouth daily. 09/26/18 09/26/19  Tvisha Schwoerer, Charline Bills, PA-C  fluticasone (FLONASE) 50 MCG/ACT nasal spray Place 2 sprays into both nostrils daily. 06/27/17   Mikey College, NP  hydrochlorothiazide (HYDRODIURIL) 12.5 MG  tablet TAKE 1 TABLET BY MOUTH ONCE DAILY 09/10/17   Mikey College, NP  HYDROcodone-acetaminophen (NORCO/VICODIN) 5-325 MG tablet Take 1-2 tablets by mouth every 6 (six) hours as needed. 09/27/14   [provider]  magic mouthwash w/lidocaine SOLN Take 5 mLs by mouth 4 (four) times daily. 01/21/18   Athanasius Kesling, Charline Bills, PA-C  metoCLOPramide (REGLAN) 10 MG tablet Take 1 tablet (10 mg total) by mouth every 8 (eight) hours as needed for nausea or vomiting. 03/21/18 03/21/19  Orbie Pyo, MD  nicotine (NICODERM CQ - DOSED IN MG/24 HR) 7 mg/24hr patch Place 1 patch (7 mg total) onto the skin daily. 04/12/17   Mikey College, NP  norethindrone-ethinyl estradiol-iron (LOESTRIN FE 1.5/30) 1.5-30 MG-MCG tablet Take 1 tablet by mouth at bedtime for 28 days. 06/27/17 07/25/17  Mikey College, NP  ondansetron (ZOFRAN ODT) 4 MG disintegrating tablet Take 1 tablet (4 mg total) by mouth every 8 (eight) hours as needed for nausea or vomiting. 03/22/18   Alfred Levins, [place], MD  ondansetron (ZOFRAN) 4 MG tablet Take 1 tablet (4 mg total) by mouth every 8 (eight) hours as needed for nausea or vomiting. Patient not taking: Reported on 11/14/2017 07/24/17   Schuyler Amor, MD  pantoprazole (PROTONIX) 40 MG tablet TAKE 1 TABLET(40 MG) BY MOUTH TWICE DAILY 05/06/18   Mikey College, NP  predniSONE (DELTASONE) 50 MG tablet Take 1 tablet (50 mg total) by mouth daily with breakfast. 09/26/18   Lyndsy Gilberto, Charline Bills, PA-C  promethazine (PHENERGAN) 12.5 MG tablet Take 1 tablet (12.5 mg total) by mouth every 6 (six) hours as needed for nausea or vomiting. 01/09/18   Merlyn Lot, MD  promethazine (PHENERGAN) 25 MG suppository Place 1 suppository (25 mg total) rectally every 6 (six) hours as needed for nausea. 11/14/17   Carrie Mew, MD    Allergies Patient has no known allergies.  Family History  Problem Relation Age of Onset  . Hypertension Mother   . Hypertension Father   .  Diabetes Father   . Heart disease Father   . Hypertension Sister   . Throat cancer Maternal Grandfather   . Stroke Paternal Grandmother   . Diabetes Paternal Grandmother   . Ovarian cancer Neg Hx   . Colon cancer Neg Hx   . Breast cancer Neg Hx     Social History Social History   Tobacco Use  . Smoking status: Current Every Day Smoker    Packs/day: 0.25    Years: 10.00    Pack years: 2.50    Types: Cigarettes  . Smokeless tobacco: Never Used  Substance Use Topics  . Alcohol use: No    Alcohol/week: 0.0 standard drinks  . Drug use: No     Review of Systems  Constitutional: No fever/chills Eyes: No visual changes. No discharge ENT: No upper respiratory complaints. Cardiovascular: no chest pain. Respiratory: no cough. No SOB. Gastrointestinal: Positive for mild reflux symptoms.  No abdominal pain.  No nausea, no vomiting.  No diarrhea.  No constipation. Genitourinary: Negative for dysuria. No hematuria Musculoskeletal: Negative for musculoskeletal pain. Skin: Negative for rash, abrasions, lacerations, ecchymosis. Neurological: Negative for  headaches, focal weakness or numbness. 10-point ROS otherwise negative.  ____________________________________________   PHYSICAL EXAM:  VITAL SIGNS: ED Triage Vitals [09/26/18 2013]  Enc Vitals Group     BP (!) 148/98     Pulse Rate 90     Resp 16     Temp 98.3 F (36.8 C)     Temp Source Oral     SpO2 100 %     Weight 220 lb (99.8 kg)     Height 5\' 7"  (1.702 m)     Head Circumference      Peak Flow      Pain Score 8     Pain Loc      Pain Edu?      Excl. in [place]?      Constitutional: Alert and oriented. Well appearing and in no acute distress. Eyes: Conjunctivae are normal. PERRL. EOMI. Head: Atraumatic. ENT:      Ears:       Nose: No congestion/rhinnorhea.      Mouth/Throat: Mucous membranes are moist.  No angioedema.  No oropharyngeal erythema. Neck: No stridor.   Hematological/Lymphatic/Immunilogical: No  cervical lymphadenopathy. Cardiovascular: Normal rate, regular rhythm. Normal S1 and S2.  Good peripheral circulation. Respiratory: Normal respiratory effort without tachypnea or retractions. Lungs CTAB. Good air entry to the bases with no decreased or absent breath sounds. Gastrointestinal: Bowel sounds 4 quadrants. Soft and nontender to palpation. No guarding or rigidity. No palpable masses. No distention. No CVA tenderness. Musculoskeletal: Full range of motion to all extremities. No gross deformities appreciated. Neurologic:  Normal speech and language. No gross focal neurologic deficits are appreciated.  Skin:  Skin is warm, dry and intact. No rash noted.  Scattered macular papular rash with a few scattered hives noted to the torso and upper extremities.  No facial involvement.  No perioral or periocular edema. Psychiatric: Mood and affect are normal. Speech and behavior are normal. Patient exhibits appropriate insight and judgement.   ____________________________________________   LABS (all labs ordered are listed, but only abnormal results are displayed)  Labs Reviewed - No data to display ____________________________________________  EKG   ____________________________________________  RADIOLOGY   No results found.  ____________________________________________    PROCEDURES  Procedure(s) performed:    Procedures    Medications  diphenhydrAMINE (BENADRYL) injection 50 mg (50 mg Intramuscular Given 09/26/18 2042)  dexamethasone (DECADRON) injection 10 mg (10 mg Intramuscular Given 09/26/18 2042)  famotidine (PEPCID) tablet 20 mg (20 mg Oral Given 09/26/18 2041)     ____________________________________________   INITIAL IMPRESSION / ASSESSMENT AND PLAN / ED COURSE  Pertinent labs & imaging results that were available during my care of the patient were reviewed by me and considered in my medical decision making (see chart for details).  Review of the [place] CSRS was  performed in accordance of the [place] prior to dispensing any controlled drugs.      Patient's diagnosis is consistent with allergic reaction to food.  Patient presents emergency department with pruritus, hives after eating onions.  Patient has a similar response with previous exposure to onions.  Patient is also having increased GERD-like symptoms.  No evidence of angioedema.  No wheezing on exam.  Patient is given diphenhydramine, Decadron and famotidine in the emergency department.. Patient will be discharged home with prescriptions for prednisone and famotidine.  She may take Benadryl at home as needed.. Patient is to follow up with primary care as needed or otherwise directed. Patient is given ED precautions to return to  the ED for any worsening or new symptoms.     ____________________________________________  FINAL CLINICAL IMPRESSION(S) / ED DIAGNOSES  Final diagnoses:  Allergic reaction to food, initial encounter      NEW MEDICATIONS STARTED DURING THIS VISIT:  ED Discharge Orders         Ordered    predniSONE (DELTASONE) 50 MG tablet  Daily with breakfast     09/26/18 2040    famotidine (PEPCID) 20 MG tablet  Daily     09/26/18 2040              This chart was dictated using voice recognition software/Dragon. Despite best efforts to proofread, errors can occur which can change the meaning. Any change was purely unintentional.    Darletta Moll, PA-C 09/26/18 2111    Earleen Newport, MD 09/26/18 2236

## 2018-10-12 ENCOUNTER — Encounter: Payer: Self-pay | Admitting: Emergency Medicine

## 2018-10-12 ENCOUNTER — Other Ambulatory Visit: Payer: Self-pay

## 2018-10-12 ENCOUNTER — Emergency Department
Admission: EM | Admit: 2018-10-12 | Discharge: 2018-10-12 | Disposition: A | Discharge status: Home / Self Care | Payer: Self-pay | Attending: Emergency Medicine | Admitting: Emergency Medicine

## 2018-10-12 DIAGNOSIS — R112 Nausea with vomiting, unspecified: Secondary | ICD-10-CM | POA: Insufficient documentation

## 2018-10-12 DIAGNOSIS — I10 Essential (primary) hypertension: Secondary | ICD-10-CM | POA: Insufficient documentation

## 2018-10-12 DIAGNOSIS — F17210 Nicotine dependence, cigarettes, uncomplicated: Secondary | ICD-10-CM | POA: Insufficient documentation

## 2018-10-12 DIAGNOSIS — Z79899 Other long term (current) drug therapy: Secondary | ICD-10-CM | POA: Insufficient documentation

## 2018-10-12 LAB — COMPREHENSIVE METABOLIC PANEL
ALT: 20 U/L (ref 0–44)
AST: 21 U/L (ref 15–41)
Albumin: 4.2 g/dL (ref 3.5–5.0)
Alkaline Phosphatase: 83 U/L (ref 38–126)
Anion gap: 8 (ref 5–15)
BILIRUBIN TOTAL: 0.7 mg/dL (ref 0.3–1.2)
BUN: 10 mg/dL (ref 6–20)
CO2: 24 mmol/L (ref 22–32)
CREATININE: 0.64 mg/dL (ref 0.44–1.00)
Calcium: 8.3 mg/dL — ABNORMAL LOW (ref 8.9–10.3)
Chloride: 109 mmol/L (ref 98–111)
GFR calc Af Amer: >60 mL/min (ref >60)
GFR calc non Af Amer: >60 mL/min (ref >60)
GLUCOSE: 104 mg/dL — AB (ref 70–99)
Potassium: 3.7 mmol/L (ref 3.5–5.1)
Sodium: 141 mmol/L (ref 135–145)
Total Protein: 7.4 g/dL (ref 6.5–8.1)

## 2018-10-12 LAB — CBC
HCT: 44 % (ref 36.0–46.0)
Hemoglobin: 14.4 g/dL (ref 12.0–15.0)
MCH: 29.4 pg (ref 26.0–34.0)
MCHC: 32.7 g/dL (ref 30.0–36.0)
MCV: 90 fL (ref 80.0–100.0)
Platelets: 330 10*3/uL (ref 150–400)
RBC: 4.89 MIL/uL (ref 3.87–5.11)
RDW: 12.9 % (ref 11.5–15.5)
WBC: 15.4 10*3/uL — ABNORMAL HIGH (ref 4.0–10.5)
nRBC: 0 % (ref 0.0–0.2)

## 2018-10-12 LAB — LIPASE, BLOOD: Lipase: 52 U/L — ABNORMAL HIGH (ref 11–51)

## 2018-10-12 MED ORDER — PANTOPRAZOLE SODIUM 20 MG PO TBEC: 20.0000 mg | DELAYED_RELEASE_TABLET | Freq: Every day | ORAL | 1 refills | Status: DC

## 2018-10-12 MED ORDER — ONDANSETRON HCL 4 MG PO TABS: 4.0000 mg | ORAL_TABLET | Freq: Three times a day (TID) | ORAL | 0 refills | Status: DC | PRN

## 2018-10-12 MED ORDER — LIDOCAINE VISCOUS HCL 2 % MT SOLN
15.0000 mL | Freq: Once | OROMUCOSAL | Status: AC
  Administered 2018-10-12: 15 mL via OROMUCOSAL
  Filled 2018-10-12: qty 15

## 2018-10-12 MED ORDER — ONDANSETRON 4 MG PO TBDP
4.0000 mg | ORAL_TABLET | Freq: Once | ORAL | Status: AC
  Administered 2018-10-12: 4 mg via ORAL
  Filled 2018-10-12: qty 1

## 2018-10-12 MED ORDER — SODIUM CHLORIDE 0.9% FLUSH: 3.0000 mL | Freq: Once | INTRAVENOUS | Status: DC

## 2018-10-12 NOTE — ED Notes (Signed)
Pt says she's been vomiting since "5 or so" PM tonight; pt ambulatory with steady gait; talking in complete coherent sentences;

## 2018-10-12 NOTE — ED Provider Notes (Signed)
[facility] Department Provider Note  ____________________________________________   I have reviewed the triage vital signs and the nursing notes.   HISTORY  Chief Complaint Emesis   History limited by: Not Limited   HPI Jasmine Mason is a 33 y.o. female who presents to the emergency department today because of concerns for nausea and vomiting.  This has been accompanied by some abdominal pain.  The pain is located primarily in the lower abdomen.  She states the symptoms started this evening.  Patient states she does have a history of acid reflux and has not been on a Protonix for a few weeks.  She does state that this feels like how her acid reflux feels when it acts up.  She denies any diarrhea.  She denies any fevers.   Records reviewed. Per medical record review patient has a history of GERD  Past Medical History:  Diagnosis Date  . Back pain   . Back pain   . GERD (gastroesophageal reflux disease)   . Hypertension   . Mesenteric adenitis   . Obesity   . PCOS (polycystic ovarian syndrome)   . Tobacco abuse     Patient Active Problem List   Diagnosis Date Noted  . 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 Encounter for smoking cessation counseling 04/18/2017  . Essential hypertension 04/18/2017  . Epigastric abdominal pain   . Acute gastritis 09/20/2016  . Nausea with vomiting   . Hiatal hernia   . Uncontrollable vomiting 03/10/2016  . Abdominal pain, epigastric   . GERD (gastroesophageal reflux disease) 01/24/2015  . Hypokalemia 01/24/2015  . Abdominal pain 01/24/2015  . Leukocytosis 01/24/2015  . Chest pain 01/24/2015  . Cough 01/24/2015  . Pain of left calf 01/24/2015  . Elevated troponin 01/24/2015  . Obesity   . Mesenteric adenitis   . Tobacco abuse     Past Surgical History:  Procedure Laterality Date  . CARPAL TUNNEL RELEASE Left ~ 2013  . COLONOSCOPY WITH PROPOFOL N/A 08/08/2017   Procedure: COLONOSCOPY WITH  PROPOFOL;  Surgeon: Jonathon Bellows, MD;  Location: [facility] ENDOSCOPY;  Service: Gastroenterology;  Laterality: N/A;  . ESOPHAGOGASTRODUODENOSCOPY N/A 01/25/2015   Procedure: ESOPHAGOGASTRODUODENOSCOPY (EGD);  Surgeon: Irene Shipper, MD;  Location: [facility] ENDOSCOPY;  Service: Endoscopy;  Laterality: N/A;  . ESOPHAGOGASTRODUODENOSCOPY (EGD) WITH PROPOFOL N/A 03/13/2016   Procedure: ESOPHAGOGASTRODUODENOSCOPY (EGD) WITH PROPOFOL;  Surgeon: Lucilla Lame, MD;  Location: [facility] ENDOSCOPY;  Service: Endoscopy;  Laterality: N/A;  . ESOPHAGOGASTRODUODENOSCOPY (EGD) WITH PROPOFOL N/A 07/05/2017   Procedure: ESOPHAGOGASTRODUODENOSCOPY (EGD) WITH PROPOFOL;  Surgeon: Jonathon Bellows, MD;  Location: [facility] ENDOSCOPY;  Service: Gastroenterology;  Laterality: N/A;  . FRACTURE SURGERY    . ORIF ANKLE FRACTURE Left 1990's    Prior to Admission medications   Medication Sig Start Date End Date Taking? Authorizing Provider  Adapalene 0.3 % gel APPLY A THIN LAYER TO FACE QHS AND [place] OFF QAM 05/29/17   [provider]  alum & mag hydroxide-simeth (MAALOX MAX) 127-517-00 MG/5ML suspension Take 5 mLs by mouth every 6 (six) hours as needed for indigestion.    [provider]  cyclobenzaprine (FLEXERIL) 5 MG tablet Take 1 tablet (5 mg total) by mouth 3 (three) times daily as needed for muscle spasms. 05/10/18   Menshew, Dannielle Karvonen, PA-C  dicyclomine (BENTYL) 10 MG capsule Take 1 capsule (10 mg total) by mouth 4 (four) times daily -  before meals and at bedtime. 08/05/17   Jonathon Bellows, MD  docusate sodium (COLACE)  100 MG capsule Take 1 capsule (100 mg total) by mouth 2 (two) times daily. 06/27/17   Mikey College, NP  famotidine (PEPCID) 20 MG tablet Take 1 tablet (20 mg total) by mouth daily. 09/26/18 09/26/19  Cuthriell, Charline Bills, PA-C  fluticasone (FLONASE) 50 MCG/ACT nasal spray Place 2 sprays into both nostrils daily. 06/27/17   Mikey College, NP  hydrochlorothiazide (HYDRODIURIL) 12.5 MG tablet TAKE 1 TABLET  BY MOUTH ONCE DAILY 09/10/17   Mikey College, NP  HYDROcodone-acetaminophen (NORCO/VICODIN) 5-325 MG tablet Take 1-2 tablets by mouth every 6 (six) hours as needed. 09/27/14   [provider]  magic mouthwash w/lidocaine SOLN Take 5 mLs by mouth 4 (four) times daily. 01/21/18   Cuthriell, Charline Bills, PA-C  metoCLOPramide (REGLAN) 10 MG tablet Take 1 tablet (10 mg total) by mouth every 8 (eight) hours as needed for nausea or vomiting. 03/21/18 03/21/19  Orbie Pyo, MD  nicotine (NICODERM CQ - DOSED IN MG/24 HR) 7 mg/24hr patch Place 1 patch (7 mg total) onto the skin daily. 04/12/17   Mikey College, NP  norethindrone-ethinyl estradiol-iron (LOESTRIN FE 1.5/30) 1.5-30 MG-MCG tablet Take 1 tablet by mouth at bedtime for 28 days. 06/27/17 07/25/17  Mikey College, NP  ondansetron (ZOFRAN ODT) 4 MG disintegrating tablet Take 1 tablet (4 mg total) by mouth every 8 (eight) hours as needed for nausea or vomiting. 03/22/18   Alfred Levins, [place], MD  ondansetron (ZOFRAN) 4 MG tablet Take 1 tablet (4 mg total) by mouth every 8 (eight) hours as needed for nausea or vomiting. Patient not taking: Reported on 11/14/2017 07/24/17   Schuyler Amor, MD  ondansetron (ZOFRAN) 4 MG tablet Take 1 tablet (4 mg total) by mouth every 8 (eight) hours as needed. 10/12/18   Nance Pear, MD  pantoprazole (PROTONIX) 20 MG tablet Take 1 tablet (20 mg total) by mouth daily. 10/12/18 10/12/19  Nance Pear, MD  pantoprazole (PROTONIX) 40 MG tablet TAKE 1 TABLET(40 MG) BY MOUTH TWICE DAILY 05/06/18   Mikey College, NP  predniSONE (DELTASONE) 50 MG tablet Take 1 tablet (50 mg total) by mouth daily with breakfast. 09/26/18   Cuthriell, Charline Bills, PA-C  promethazine (PHENERGAN) 12.5 MG tablet Take 1 tablet (12.5 mg total) by mouth every 6 (six) hours as needed for nausea or vomiting. 01/09/18   Merlyn Lot, MD  promethazine (PHENERGAN) 25 MG suppository Place 1 suppository (25 mg total)  rectally every 6 (six) hours as needed for nausea. 11/14/17   Carrie Mew, MD    Allergies Patient has no known allergies.  Family History  Problem Relation Age of Onset  . Hypertension Mother   . Hypertension Father   . Diabetes Father   . Heart disease Father   . Hypertension Sister   . Throat cancer Maternal Grandfather   . Stroke Paternal Grandmother   . Diabetes Paternal Grandmother   . Ovarian cancer Neg Hx   . Colon cancer Neg Hx   . Breast cancer Neg Hx     Social History Social History   Tobacco Use  . Smoking status: Current Every Day Smoker    Packs/day: 0.25    Years: 10.00    Pack years: 2.50    Types: Cigarettes  . Smokeless tobacco: Never Used  Substance Use Topics  . Alcohol use: No    Alcohol/week: 0.0 standard drinks  . Drug use: No    Review of Systems Constitutional: No fever/chills Eyes: No visual changes. ENT:  No sore throat. Cardiovascular: Denies chest pain. Respiratory: Denies shortness of breath. Gastrointestinal: Positive for abdominal pain, nausea and vomiting.  Genitourinary: Negative for dysuria. Musculoskeletal: Negative for back pain. Skin: Negative for rash. Neurological: Negative for headaches, focal weakness or numbness.  ____________________________________________   PHYSICAL EXAM:  VITAL SIGNS: ED Triage Vitals  Enc Vitals Group     BP 10/12/18 1930 (!) 143/89     Pulse Rate 10/12/18 1930 85     Resp 10/12/18 1930 16     Temp 10/12/18 1930 98.3 F (36.8 C)     Temp Source 10/12/18 1930 Oral     SpO2 10/12/18 1930 99 %     Weight 10/12/18 1936 220 lb (99.8 kg)     Height 10/12/18 1936 5\' 7"  (1.702 m)     Head Circumference --      Peak Flow --      Pain Score 10/12/18 1936 8   Constitutional: Alert and oriented.  Eyes: Conjunctivae are normal.  ENT      Head: Normocephalic and atraumatic.      Nose: No congestion/rhinnorhea.      Mouth/Throat: Mucous membranes are moist.      Neck: No  stridor. Hematological/Lymphatic/Immunilogical: No cervical lymphadenopathy. Cardiovascular: Normal rate, regular rhythm.  No murmurs, rubs, or gallops.  Respiratory: Normal respiratory effort without tachypnea nor retractions. Breath sounds are clear and equal bilaterally. No wheezes/rales/rhonchi. Gastrointestinal: Soft and non tender. No rebound. No guarding.  Genitourinary: Deferred Musculoskeletal: Normal range of motion in all extremities.  Neurologic:  Normal speech and language. No gross focal neurologic deficits are appreciated.  Skin:  Skin is warm, dry and intact. No rash noted. Psychiatric: Mood and affect are normal. Speech and behavior are normal. Patient exhibits appropriate insight and judgment.  ____________________________________________    LABS (pertinent positives/negatives)  Lipase 52 CBC wbc 15.4, hgb 14.4, plt 330 CMP wnl except glu 104, ca 8.3  ____________________________________________   EKG  None  ____________________________________________    RADIOLOGY  None  ____________________________________________   PROCEDURES  Procedures  ____________________________________________   INITIAL IMPRESSION / ASSESSMENT AND PLAN / ED COURSE  Pertinent labs & imaging results that were available during my care of the patient were reviewed by me and considered in my medical decision making (see chart for details).   Patient presented to the emergency department today because of concerns for nausea vomiting and some abdominal pain.  Differential would be broad including pancreatitis, gallbladder disease, appendicitis, gastroenteritis, food poisoning, GERD amongst other etiologies.  Patient states it does remind her recurrent.  Patient had mild leukocytosis on blood work but it appears she always has some slight leukocytosis.  Patient's abdomen was benign and patient is afebrile here.  At this point I doubt appendicitis.  Doubt gallbladder disease given  lack of right upper quadrant tenderness.  Mild elevation of her lipase could be secondary to the vomiting.  She did feel better after medication.  This point I think GERD likely.  Will give patient prescription for Protonix and nausea medicine.  Discussed strict return precautions.  ____________________________________________   FINAL CLINICAL IMPRESSION(S) / ED DIAGNOSES  Final diagnoses:  Nausea and vomiting, intractability of vomiting not specified, unspecified vomiting type     Note: This dictation was prepared with Dragon dictation. Any transcriptional errors that result from this process are unintentional     Nance Pear, MD 10/12/18 2236

## 2018-10-12 NOTE — ED Triage Notes (Signed)
Pt arrives ambulatory and POV to triage with c/o emesis since 1700. Pt states that she has acid reflux and only acid is coming up at this time. Pt is in NAD.

## 2018-10-12 NOTE — ED Notes (Signed)
Pt was triaged by this RN erroneously under EDT Beth.

## 2018-10-12 NOTE — Discharge Instructions (Addendum)
Please seek medical attention for any high fevers, chest pain, shortness of breath, change in behavior, persistent vomiting, bloody stool or any other new or concerning symptoms.  

## 2018-10-13 ENCOUNTER — Encounter: Payer: Self-pay | Admitting: Emergency Medicine

## 2018-10-13 ENCOUNTER — Emergency Department: Payer: Self-pay

## 2018-10-13 ENCOUNTER — Emergency Department
Admission: EM | Admit: 2018-10-13 | Discharge: 2018-10-13 | Disposition: A | Discharge status: Home / Self Care | Payer: Self-pay | Attending: Emergency Medicine | Admitting: Emergency Medicine

## 2018-10-13 ENCOUNTER — Other Ambulatory Visit: Payer: Self-pay

## 2018-10-13 DIAGNOSIS — E860 Dehydration: Secondary | ICD-10-CM | POA: Insufficient documentation

## 2018-10-13 DIAGNOSIS — I10 Essential (primary) hypertension: Secondary | ICD-10-CM | POA: Insufficient documentation

## 2018-10-13 DIAGNOSIS — Z79899 Other long term (current) drug therapy: Secondary | ICD-10-CM | POA: Insufficient documentation

## 2018-10-13 DIAGNOSIS — N3090 Cystitis, unspecified without hematuria: Secondary | ICD-10-CM | POA: Insufficient documentation

## 2018-10-13 DIAGNOSIS — F17210 Nicotine dependence, cigarettes, uncomplicated: Secondary | ICD-10-CM | POA: Insufficient documentation

## 2018-10-13 DIAGNOSIS — J069 Acute upper respiratory infection, unspecified: Secondary | ICD-10-CM | POA: Insufficient documentation

## 2018-10-13 LAB — CBC
HCT: 46.5 % — ABNORMAL HIGH (ref 36.0–46.0)
Hemoglobin: 15.2 g/dL — ABNORMAL HIGH (ref 12.0–15.0)
MCH: 29.3 pg (ref 26.0–34.0)
MCHC: 32.7 g/dL (ref 30.0–36.0)
MCV: 89.6 fL (ref 80.0–100.0)
Platelets: 359 10*3/uL (ref 150–400)
RBC: 5.19 MIL/uL — ABNORMAL HIGH (ref 3.87–5.11)
RDW: 12.7 % (ref 11.5–15.5)
WBC: 12.9 10*3/uL — ABNORMAL HIGH (ref 4.0–10.5)
nRBC: 0 % (ref 0.0–0.2)

## 2018-10-13 LAB — URINALYSIS, COMPLETE (UACMP) WITH MICROSCOPIC
Bilirubin Urine: NEGATIVE
Glucose, UA: NEGATIVE mg/dL
Ketones, ur: NEGATIVE mg/dL
Leukocytes,Ua: NEGATIVE
Nitrite: NEGATIVE
PROTEIN: NEGATIVE mg/dL
Specific Gravity, Urine: 1.016 (ref 1.005–1.030)
pH: 6 (ref 5.0–8.0)

## 2018-10-13 LAB — COMPREHENSIVE METABOLIC PANEL
ALT: 22 U/L (ref 0–44)
AST: 22 U/L (ref 15–41)
Albumin: 4.6 g/dL (ref 3.5–5.0)
Alkaline Phosphatase: 90 U/L (ref 38–126)
Anion gap: 9 (ref 5–15)
BUN: 9 mg/dL (ref 6–20)
CALCIUM: 8.6 mg/dL — AB (ref 8.9–10.3)
CO2: 23 mmol/L (ref 22–32)
Chloride: 105 mmol/L (ref 98–111)
Creatinine, Ser: 0.52 mg/dL (ref 0.44–1.00)
GFR calc non Af Amer: >60 mL/min (ref >60)
Glucose, Bld: 89 mg/dL (ref 70–99)
Potassium: 3.6 mmol/L (ref 3.5–5.1)
Sodium: 137 mmol/L (ref 135–145)
Total Bilirubin: 1 mg/dL (ref 0.3–1.2)
Total Protein: 8 g/dL (ref 6.5–8.1)

## 2018-10-13 LAB — LIPASE, BLOOD: Lipase: 31 U/L (ref 11–51)

## 2018-10-13 LAB — POCT PREGNANCY, URINE: Preg Test, Ur: NEGATIVE

## 2018-10-13 LAB — INFLUENZA PANEL BY PCR (TYPE A & B)
Influenza A By PCR: NEGATIVE
Influenza B By PCR: NEGATIVE

## 2018-10-13 MED ORDER — KETOROLAC TROMETHAMINE 30 MG/ML IJ SOLN
30.0000 mg | Freq: Once | INTRAMUSCULAR | Status: AC
  Administered 2018-10-13: 30 mg via INTRAVENOUS
  Filled 2018-10-13: qty 1

## 2018-10-13 MED ORDER — ONDANSETRON HCL 4 MG/2ML IJ SOLN
4.0000 mg | Freq: Once | INTRAMUSCULAR | Status: AC
  Administered 2018-10-13: 4 mg via INTRAVENOUS
  Filled 2018-10-13: qty 2

## 2018-10-13 MED ORDER — SODIUM CHLORIDE 0.9 % IV BOLUS: 1000.0000 mL | Freq: Once | INTRAVENOUS | Status: DC

## 2018-10-13 MED ORDER — SODIUM CHLORIDE 0.9 % IV BOLUS
1000.0000 mL | Freq: Once | INTRAVENOUS | Status: AC
  Administered 2018-10-13: 1000 mL via INTRAVENOUS

## 2018-10-13 MED ORDER — PANTOPRAZOLE SODIUM 20 MG PO TBEC: 20.0000 mg | DELAYED_RELEASE_TABLET | Freq: Every day | ORAL | 1 refills | Status: DC

## 2018-10-13 MED ORDER — CEPHALEXIN 250 MG PO CAPS: 250.0000 mg | ORAL_CAPSULE | Freq: Two times a day (BID) | ORAL | 0 refills | Status: AC

## 2018-10-13 MED ORDER — IOPAMIDOL (ISOVUE-300) INJECTION 61%
30.0000 mL | Freq: Once | INTRAVENOUS | Status: AC | PRN
  Administered 2018-10-13: 30 mL via ORAL
  Filled 2018-10-13: qty 30

## 2018-10-13 MED ORDER — IOHEXOL 300 MG/ML  SOLN
100.0000 mL | Freq: Once | INTRAMUSCULAR | Status: AC | PRN
  Administered 2018-10-13: 100 mL via INTRAVENOUS
  Filled 2018-10-13: qty 100

## 2018-10-13 MED ORDER — CEPHALEXIN 500 MG PO CAPS
500.0000 mg | ORAL_CAPSULE | Freq: Once | ORAL | Status: AC
  Administered 2018-10-13: 500 mg via ORAL
  Filled 2018-10-13: qty 1

## 2018-10-13 NOTE — ED Triage Notes (Signed)
Nausea and vomiting began 5 pm yesterday. States was seen here. Today began cough during night.

## 2018-10-13 NOTE — ED Notes (Signed)
Patient transported to CT 

## 2018-10-13 NOTE — ED Provider Notes (Signed)
[facility] Emergency Department Provider Note   ____________________________________________   First MD Initiated Contact with Patient 10/13/18 1137     (approximate)  I have reviewed the triage vital signs and the nursing notes.   HISTORY  Chief Complaint Emesis    HPI Jasmine Mason is a 33 y.o. female here for evaluation for vomiting abdominal pain cough and chills  Patient reports that 1 day ago she started developing nausea abdominal discomfort.  He was at discharge this morning, she started to develop vomiting.  Has vomited a few times.  Had a fever of 99.6, some chills and body aches.  She developed a slight dry cough.  She reports ongoing feeling of abdominal discomfort, is primarily located in the mid to right upper abdomen.  No chest pain.  No shortness of breath.  She does work as a Marine scientist but has not had any exposure to anyone with known rhinovirus  Ports she is otherwise healthy.  Not currently taking any medications.  No allergies.  Denies pregnancy.  No pain burning or discomfort with urination.  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/2017   Encounter for smoking cessation counseling 04/18/2017   Essential hypertension 04/18/2017   Epigastric abdominal pain    Acute gastritis 09/20/2016   Nausea with vomiting    Hiatal hernia    Uncontrollable vomiting 03/10/2016   Abdominal pain, epigastric    GERD (gastroesophageal reflux disease) 01/24/2015   Hypokalemia 01/24/2015   Abdominal pain 01/24/2015   Leukocytosis 01/24/2015   Chest pain 01/24/2015   Cough  01/24/2015   Pain of left calf 01/24/2015   Elevated troponin 01/24/2015   Obesity    Mesenteric adenitis    Tobacco abuse     Past Surgical History:  Procedure Laterality Date   CARPAL TUNNEL RELEASE Left ~ 2013   COLONOSCOPY WITH PROPOFOL N/A 08/08/2017   Procedure: COLONOSCOPY WITH PROPOFOL;  Surgeon: Jonathon Bellows, MD;  Location: [facility] ENDOSCOPY;  Service: Gastroenterology;  Laterality: N/A;   ESOPHAGOGASTRODUODENOSCOPY N/A 01/25/2015   Procedure: ESOPHAGOGASTRODUODENOSCOPY (EGD);  Surgeon: Irene Shipper, MD;  Location: [facility] ENDOSCOPY;  Service: Endoscopy;  Laterality: N/A;   ESOPHAGOGASTRODUODENOSCOPY (EGD) WITH PROPOFOL N/A 03/13/2016   Procedure: ESOPHAGOGASTRODUODENOSCOPY (EGD) WITH PROPOFOL;  Surgeon: Lucilla Lame, MD;  Location: [facility] ENDOSCOPY;  Service: Endoscopy;  Laterality: N/A;   ESOPHAGOGASTRODUODENOSCOPY (EGD) WITH PROPOFOL N/A 07/05/2017   Procedure: ESOPHAGOGASTRODUODENOSCOPY (EGD) WITH PROPOFOL;  Surgeon: Jonathon Bellows, MD;  Location: [facility] ENDOSCOPY;  Service: Gastroenterology;  Laterality: N/A;   FRACTURE SURGERY     ORIF ANKLE FRACTURE Left 1990's    Prior to Admission medications   Medication Sig Start Date End Date Taking? Authorizing Provider  Adapalene 0.3 % gel APPLY A THIN LAYER TO FACE QHS AND [place] OFF QAM 05/29/17   [provider]  alum & mag hydroxide-simeth (MAALOX MAX) 299-371-69 MG/5ML suspension Take 5 mLs by mouth every 6 (six) hours as needed for indigestion.    [provider]  cephALEXin (KEFLEX) 250 MG capsule Take 1 capsule (250 mg total) by mouth 2 (  two) times daily for 5 days. 10/13/18 10/18/18  Delman Kitten, MD  cyclobenzaprine (FLEXERIL) 5 MG tablet Take 1 tablet (5 mg total) by mouth 3 (three) times daily as needed for muscle spasms. 05/10/18   Menshew, Dannielle Karvonen, PA-C  dicyclomine (BENTYL) 10 MG capsule Take 1 capsule (10 mg total) by mouth 4 (four) times daily -  before meals and at bedtime. 08/05/17   Jonathon Bellows, MD    docusate sodium (COLACE) 100 MG capsule Take 1 capsule (100 mg total) by mouth 2 (two) times daily. 06/27/17   Mikey College, NP  famotidine (PEPCID) 20 MG tablet Take 1 tablet (20 mg total) by mouth daily. 09/26/18 09/26/19  Cuthriell, Charline Bills, PA-C  fluticasone (FLONASE) 50 MCG/ACT nasal spray Place 2 sprays into both nostrils daily. 06/27/17   Mikey College, NP  hydrochlorothiazide (HYDRODIURIL) 12.5 MG tablet TAKE 1 TABLET BY MOUTH ONCE DAILY 09/10/17   Mikey College, NP  HYDROcodone-acetaminophen (NORCO/VICODIN) 5-325 MG tablet Take 1-2 tablets by mouth every 6 (six) hours as needed. 09/27/14   [provider]  magic mouthwash w/lidocaine SOLN Take 5 mLs by mouth 4 (four) times daily. 01/21/18   Cuthriell, Charline Bills, PA-C  metoCLOPramide (REGLAN) 10 MG tablet Take 1 tablet (10 mg total) by mouth every 8 (eight) hours as needed for nausea or vomiting. 03/21/18 03/21/19  Orbie Pyo, MD  nicotine (NICODERM CQ - DOSED IN MG/24 HR) 7 mg/24hr patch Place 1 patch (7 mg total) onto the skin daily. 04/12/17   Mikey College, NP  norethindrone-ethinyl estradiol-iron (LOESTRIN FE 1.5/30) 1.5-30 MG-MCG tablet Take 1 tablet by mouth at bedtime for 28 days. 06/27/17 07/25/17  Mikey College, NP  ondansetron (ZOFRAN ODT) 4 MG disintegrating tablet Take 1 tablet (4 mg total) by mouth every 8 (eight) hours as needed for nausea or vomiting. 03/22/18   Alfred Levins, [place], MD  ondansetron (ZOFRAN) 4 MG tablet Take 1 tablet (4 mg total) by mouth every 8 (eight) hours as needed for nausea or vomiting. Patient not taking: Reported on 11/14/2017 07/24/17   Schuyler Amor, MD  ondansetron (ZOFRAN) 4 MG tablet Take 1 tablet (4 mg total) by mouth every 8 (eight) hours as needed. 10/12/18   Nance Pear, MD  pantoprazole (PROTONIX) 20 MG tablet Take 1 tablet (20 mg total) by mouth daily. 10/13/18 10/13/19  Delman Kitten, MD  predniSONE (DELTASONE) 50 MG tablet Take 1 tablet (50  mg total) by mouth daily with breakfast. 09/26/18   Cuthriell, Charline Bills, PA-C  promethazine (PHENERGAN) 12.5 MG tablet Take 1 tablet (12.5 mg total) by mouth every 6 (six) hours as needed for nausea or vomiting. 01/09/18   Merlyn Lot, MD  promethazine (PHENERGAN) 25 MG suppository Place 1 suppository (25 mg total) rectally every 6 (six) hours as needed for nausea. 11/14/17   Carrie Mew, MD    Allergies Patient has no known allergies.  Family History  Problem Relation Age of Onset   Hypertension Mother    Hypertension Father    Diabetes Father    Heart disease Father    Hypertension Sister    Throat cancer Maternal Grandfather    Stroke Paternal Grandmother    Diabetes Paternal Grandmother    Ovarian cancer Neg Hx    Colon cancer Neg Hx    Breast cancer Neg Hx     Social History Social History   Tobacco Use   Smoking status: Current Every Day Smoker    Packs/day:  0.25    Years: 10.00    Pack years: 2.50    Types: Cigarettes   Smokeless tobacco: Never Used  Substance Use Topics   Alcohol use: No    Alcohol/week: 0.0 standard drinks   Drug use: No    Review of Systems Constitutional: N some fevers and chills and muscle aches, little bit of fatigue but still able to walk without any difficulty Eyes: No visual changes. ENT: No sore throat. Cardiovascular: Denies chest pain. Respiratory: Denies shortness of breath. Gastrointestinal: See HPI.  Decreased appetite, has not felt like eating anything since last night after leaving the ER Genitourinary: Negative for dysuria. Musculoskeletal: Negative for back pain. Skin: Negative for rash. Neurological: Negative for headaches, areas of focal weakness or numbness.    ____________________________________________   PHYSICAL EXAM:  VITAL SIGNS: ED Triage Vitals  Enc Vitals Group     BP 10/13/18 1055 (!) 144/93     Pulse Rate 10/13/18 1055 89     Resp 10/13/18 1055 20     Temp 10/13/18 1055  98.5 F (36.9 C)     Temp Source 10/13/18 1055 Oral     SpO2 10/13/18 1055 100 %     Weight 10/13/18 1055 220 lb (99.8 kg)     Height 10/13/18 1055 5\' 7"  (1.702 m)     Head Circumference --      Peak Flow --      Pain Score 10/13/18 1100 6     Pain Loc --      Pain Edu? --      Excl. in [place]? --     Constitutional: Alert and oriented.  Mildly ill-appearing but in no distress eyes: Conjunctivae are normal. Head: Atraumatic. Nose: Clear rhinorrhea is minimal Mouth/Throat: Mucous membranes are moist. Neck: No stridor.  Cardiovascular: Normal rate, regular rhythm. Grossly normal heart sounds.  Good peripheral circulation. Respiratory: Normal respiratory effort.  No retractions. Lungs CTAB. Gastrointestinal: Soft and mild tenderness to palpation primarily in the right lower quadrant and right flank region.  There is no rebound or guarding.  There is some pain located McBurney's point but it is primarily nonfocal.  The left side nontender.  No peritonitis in any quadrant no distention. Musculoskeletal: No lower extremity tenderness nor edema. Neurologic:  Normal speech and language. No gross focal neurologic deficits are appreciated.  Skin:  Skin is warm, dry and intact. No rash noted. Psychiatric: Mood and affect are normal. Speech and behavior are normal.  ____________________________________________   LABS (all labs ordered are listed, but only abnormal results are displayed)  Labs Reviewed  CBC - Abnormal; Notable for the following components:      Result Value   WBC 12.9 (*)    RBC 5.19 (*)    Hemoglobin 15.2 (*)    HCT 46.5 (*)    All other components within normal limits  COMPREHENSIVE METABOLIC PANEL - Abnormal; Notable for the following components:   Calcium 8.6 (*)    All other components within normal limits  URINALYSIS, COMPLETE (UACMP) WITH MICROSCOPIC - Abnormal; Notable for the following components:   Color, Urine YELLOW (*)    APPearance HAZY (*)    Hgb urine  dipstick SMALL (*)    Bacteria, UA RARE (*)    All other components within normal limits  INFLUENZA PANEL BY PCR (TYPE A & B)  LIPASE, BLOOD  POC URINE PREG, ED  POCT PREGNANCY, URINE   ____________________________________________  EKG  No chest pain.  No cardiac  symptoms. ____________________________________________  RADIOLOGY  Ct Abdomen Pelvis W Contrast  Result Date: 10/13/2018 CLINICAL DATA:  Nausea and vomiting with fever EXAM: CT ABDOMEN AND PELVIS WITH CONTRAST TECHNIQUE: Multidetector CT imaging of the abdomen and pelvis was performed using the standard protocol following bolus administration of intravenous contrast. Oral contrast was also administered. CONTRAST:  148mL OMNIPAQUE IOHEXOL 300 MG/ML  SOLN COMPARISON:  September 05, 2018 FINDINGS: Lower chest: Lung bases are clear.  There is a small hiatal hernia. Hepatobiliary: Liver measures 20.4 cm in length. No focal liver lesions are appreciable. There is slight fatty infiltration near the fissure for the ligamentum teres. The gallbladder wall is not appreciably thickened. There is no biliary duct dilatation. Pancreas: There is no pancreatic mass or inflammatory focus. Spleen: No splenic lesions are evident. Adrenals/Urinary Tract: Adrenals bilaterally appear normal. Kidneys bilaterally show no evident mass or hydronephrosis on either side. There is no appreciable renal or ureteral calculus on either side. Urinary bladder is midline with wall thickness overall increased. Stomach/Bowel: There is no appreciable bowel wall or mesenteric thickening. There is no evident bowel obstruction. There is no free air or portal venous air. Vascular/Lymphatic: There is no abdominal aortic aneurysm. There is slight calcification in the right common iliac artery. Major mesenteric arterial vessels are patent. There is no evident adenopathy in the abdomen or pelvis. Reproductive: Uterus appears unremarkable with respect to size and contour. Both ovaries  remain rather prominent but stable compared to most recent study. There is a decreased attenuation focus along the right vaginal wall measuring 1.7 x 0.9 cm, a probable Bartholin's gland cyst. Other: No periappendiceal region inflammation evident. No abscess or ascites evident in the abdomen or pelvis. There is a small ventral hernia containing only fat. Musculoskeletal: There are foci of degenerative change in the lumbar spine. There are no blastic or lytic bone lesions. No intramuscular lesions are evident. IMPRESSION: 1. Urinary bladder wall thickening, a finding felt to be indicative of cystitis. No renal or ureteral calculus evident. 2. Both ovaries remain rather prominent. Significance of this finding uncertain. Correlation with pelvic ultrasound may be helpful to further evaluate in this regard. No pelvic masses appreciable by CT. 3. Probable Bartholin's gland cyst arising from the right wall of the vagina. 4.  Prominent liver without focal lesion evident. 5. No appreciable bowel obstruction. No abscess in the abdomen or pelvis. No periappendiceal region inflammatory change. 6.  Small ventral hernia containing only fat.  Small hiatal hernia. Electronically Signed   By: Lowella Grip III M.D.   On: 10/13/2018 14:45   Dg Chest Port 1 View  Result Date: 10/13/2018 CLINICAL DATA:  Cough.  Nausea and vomiting. EXAM: PORTABLE CHEST 1 VIEW COMPARISON:  Chest x-ray dated January 09, 2018. FINDINGS: The heart size and mediastinal contours are within normal limits. Both lungs are clear. The visualized skeletal structures are unremarkable. IMPRESSION: No active disease. Electronically Signed   By: Titus Dubin M.D.   On: 10/13/2018 12:22     Imaging reviewed.  No acute findings other than bladder wall thickening are noted.  Patient has had previous evaluations of ovaries, no ovarian symptoms today  Chest x-ray clear ____________________________________________   PROCEDURES  Procedure(s) performed:  None  Procedures  Critical Care performed: No  ____________________________________________   INITIAL IMPRESSION / ASSESSMENT AND PLAN / ED COURSE  Pertinent labs & imaging results that were available during my care of the patient were reviewed by me and considered in my medical decision making (see chart  for details).   Patient presents with symptoms that suggest likely a viral etiology.  No obvious risk factors for coronavirus.  She is nontoxic.  She does have some focal tenderness on exam primarily along the right lower and right mid abdomen.  Discussed risk benefit and will proceed with CT scan of the abdomen and pelvis to exclude acute infectious etiologies or further evaluate for cause of pain  Normal work of breathing.  Clear lung sounds.  Will obtain chest x-ray to exclude infiltrate given report of fever associated with this and worsening of symptoms since last night.  Could represent coronavirus, but appears overall low risk.  Also check influenza.  Due to limitation of tests, coronavirus testing is not indicated for her at this time    ----------------------------------------- 3:09 PM on 10/13/2018 -----------------------------------------  Suspect low risk for coronavirus, cannot exclude and we are not presently testing based on shortage of coronavirus test kits for currently this patient's symptoms.  She has minimal symptomatology, nontoxic well-appearing normal oxygen saturation.  Discussed self monitoring, treatment recommendations and will treat with cephalexin in the event she does also have concomitant urinary tract infection based on CT imaging and bacteria in urine.  Patient agreement with plan above.  She feels better, resting comfortably.  No distress.  Ambulatory feels well  Ct Abdomen Pelvis W Contrast  Result Date: 10/13/2018 CLINICAL DATA:  Nausea and vomiting with fever EXAM: CT ABDOMEN AND PELVIS WITH CONTRAST TECHNIQUE: Multidetector CT imaging of the abdomen  and pelvis was performed using the standard protocol following bolus administration of intravenous contrast. Oral contrast was also administered. CONTRAST:  125mL OMNIPAQUE IOHEXOL 300 MG/ML  SOLN COMPARISON:  September 05, 2018 FINDINGS: Lower chest: Lung bases are clear.  There is a small hiatal hernia. Hepatobiliary: Liver measures 20.4 cm in length. No focal liver lesions are appreciable. There is slight fatty infiltration near the fissure for the ligamentum teres. The gallbladder wall is not appreciably thickened. There is no biliary duct dilatation. Pancreas: There is no pancreatic mass or inflammatory focus. Spleen: No splenic lesions are evident. Adrenals/Urinary Tract: Adrenals bilaterally appear normal. Kidneys bilaterally show no evident mass or hydronephrosis on either side. There is no appreciable renal or ureteral calculus on either side. Urinary bladder is midline with wall thickness overall increased. Stomach/Bowel: There is no appreciable bowel wall or mesenteric thickening. There is no evident bowel obstruction. There is no free air or portal venous air. Vascular/Lymphatic: There is no abdominal aortic aneurysm. There is slight calcification in the right common iliac artery. Major mesenteric arterial vessels are patent. There is no evident adenopathy in the abdomen or pelvis. Reproductive: Uterus appears unremarkable with respect to size and contour. Both ovaries remain rather prominent but stable compared to most recent study. There is a decreased attenuation focus along the right vaginal wall measuring 1.7 x 0.9 cm, a probable Bartholin's gland cyst. Other: No periappendiceal region inflammation evident. No abscess or ascites evident in the abdomen or pelvis. There is a small ventral hernia containing only fat. Musculoskeletal: There are foci of degenerative change in the lumbar spine. There are no blastic or lytic bone lesions. No intramuscular lesions are evident. IMPRESSION: 1. Urinary bladder  wall thickening, a finding felt to be indicative of cystitis. No renal or ureteral calculus evident. 2. Both ovaries remain rather prominent. Significance of this finding uncertain. Correlation with pelvic ultrasound may be helpful to further evaluate in this regard. No pelvic masses appreciable by CT. 3. Probable Bartholin's gland cyst  arising from the right wall of the vagina. 4.  Prominent liver without focal lesion evident. 5. No appreciable bowel obstruction. No abscess in the abdomen or pelvis. No periappendiceal region inflammatory change. 6.  Small ventral hernia containing only fat.  Small hiatal hernia. Electronically Signed   By: Lowella Grip III M.D.   On: 10/13/2018 14:45   Dg Chest Port 1 View  Result Date: 10/13/2018 CLINICAL DATA:  Cough.  Nausea and vomiting. EXAM: PORTABLE CHEST 1 VIEW COMPARISON:  Chest x-ray dated January 09, 2018. FINDINGS: The heart size and mediastinal contours are within normal limits. Both lungs are clear. The visualized skeletal structures are unremarkable. IMPRESSION: No active disease. Electronically Signed   By: Titus Dubin M.D.   On: 10/13/2018 12:22     ____________________________________________   FINAL CLINICAL IMPRESSION(S) / ED DIAGNOSES  Final diagnoses:  Dehydration  Viral upper respiratory illness  Cystitis  Rule out coronavirus      Note:  This document was prepared using Dragon voice recognition software and may include unintentional dictation errors       Delman Kitten, MD 10/13/18 1510

## 2018-10-13 NOTE — ED Notes (Signed)
First Nurse Note: Patient states she was seen in [facility] tent and sent to ED, wearing adult face mask.  Complaining of fever, accompanied by visitor who was made aware of visiting policy.

## 2018-10-13 NOTE — Discharge Instructions (Signed)
Person Under Monitoring Name: Jasmine Mason  Location: [address] [place] [address]   Infection Prevention Recommendations for Individuals Confirmed to have, or Being Evaluated for, 2019 Novel Coronavirus (COVID-19) Infection Who Receive Care at Home  Individuals who are confirmed to have, or are being evaluated for, COVID-19 should follow the prevention steps below until a healthcare provider or local or state health department says they can return to normal activities.  Stay home except to get medical care You should restrict activities outside your home, except for getting medical care. Do not go to work, school, or public areas, and do not use public transportation or taxis.  Call ahead before visiting your doctor Before your medical appointment, call the healthcare provider and tell them that you have, or are being evaluated for, COVID-19 infection. This will help the healthcare providers office take steps to keep other people from getting infected. Ask your healthcare provider to call the local or state health department.  Monitor your symptoms Seek prompt medical attention if your illness is worsening (e.g., difficulty breathing). Before going to your medical appointment, call the healthcare provider and tell them that you have, or are being evaluated for, COVID-19 infection. Ask your healthcare provider to call the local or state health department.  Wear a facemask You should wear a facemask that covers your nose and mouth when you are in the same room with other people and when you visit a healthcare provider. People who live with or visit you should also wear a facemask while they are in the same room with you.  Separate yourself from other people in your home As much as possible, you should stay in a different room from other people in your home. Also, you should use a separate bathroom, if available.  Avoid sharing household items You should not  share dishes, drinking glasses, cups, eating utensils, towels, bedding, or other items with other people in your home. After using these items, you should wash them thoroughly with soap and water.  Cover your coughs and sneezes Cover your mouth and nose with a tissue when you cough or sneeze, or you can cough or sneeze into your sleeve. Throw used tissues in a lined trash can, and immediately wash your hands with soap and water for at least 20 seconds or use an alcohol-based hand rub.  Wash your [facility] your hands often and thoroughly with soap and water for at least 20 seconds. You can use an alcohol-based hand sanitizer if soap and water are not available and if your hands are not visibly dirty. Avoid touching your eyes, nose, and mouth with unwashed hands.   Prevention Steps for Caregivers and Household Members of Individuals Confirmed to have, or Being Evaluated for, COVID-19 Infection Being Cared for in the Home  If you live with, or provide care at home for, a person confirmed to have, or being evaluated for, COVID-19 infection please follow these guidelines to prevent infection:  Follow healthcare providers instructions Make sure that you understand and can help the patient follow any healthcare provider instructions for all care.  Provide for the patients basic needs You should help the patient with basic needs in the home and provide support for getting groceries, prescriptions, and other personal needs.  Monitor the patients symptoms If they are getting sicker, call his or her medical provider and tell them that the patient has, or is being evaluated for, COVID-19 infection. This will help the healthcare providers  office take steps to keep other people from getting infected. Ask the healthcare provider to call the local or state health department.  Limit the number of people who have contact with the patient If possible, have only one caregiver for the  patient. Other household members should stay in another home or place of residence. If this is not possible, they should stay in another room, or be separated from the patient as much as possible. Use a separate bathroom, if available. Restrict visitors who do not have an essential need to be in the home.  Keep older adults, very young children, and other sick people away from the patient Keep older adults, very young children, and those who have compromised immune systems or chronic health conditions away from the patient. This includes people with chronic heart, lung, or kidney conditions, diabetes, and cancer.  Ensure good ventilation Make sure that shared spaces in the home have good air flow, such as from an air conditioner or an opened window, weather permitting.  Wash your hands often Wash your hands often and thoroughly with soap and water for at least 20 seconds. You can use an alcohol based hand sanitizer if soap and water are not available and if your hands are not visibly dirty. Avoid touching your eyes, nose, and mouth with unwashed hands. Use disposable paper towels to dry your hands. If not available, use dedicated cloth towels and replace them when they become wet.  Wear a facemask and gloves Wear a disposable facemask at all times in the room and gloves when you touch or have contact with the patients blood, body fluids, and/or secretions or excretions, such as sweat, saliva, sputum, nasal mucus, vomit, urine, or feces.  Ensure the mask fits over your nose and mouth tightly, and do not touch it during use. Throw out disposable facemasks and gloves after using them. Do not reuse. Wash your hands immediately after removing your facemask and gloves. If your personal clothing becomes contaminated, carefully remove clothing and launder. Wash your hands after handling contaminated clothing. Place all used disposable facemasks, gloves, and other waste in a lined container before  disposing them with other household waste. Remove gloves and wash your hands immediately after handling these items.  Do not share dishes, glasses, or other household items with the patient Avoid sharing household items. You should not share dishes, drinking glasses, cups, eating utensils, towels, bedding, or other items with a patient who is confirmed to have, or being evaluated for, COVID-19 infection. After the person uses these items, you should wash them thoroughly with soap and water.  Wash laundry thoroughly Immediately remove and wash clothes or bedding that have blood, body fluids, and/or secretions or excretions, such as sweat, saliva, sputum, nasal mucus, vomit, urine, or feces, on them. Wear gloves when handling laundry from the patient. Read and follow directions on labels of laundry or clothing items and detergent. In general, wash and dry with the warmest temperatures recommended on the label.  Clean all areas the individual has used often Clean all touchable surfaces, such as counters, tabletops, doorknobs, bathroom fixtures, toilets, phones, keyboards, tablets, and bedside tables, every day. Also, clean any surfaces that may have blood, body fluids, and/or secretions or excretions on them. Wear gloves when cleaning surfaces the patient has come in contact with. Use a diluted bleach solution (e.g., dilute bleach with 1 part bleach and 10 parts water) or a household disinfectant with a label that says EPA-registered for coronaviruses. To make a  bleach solution at home, add 1 tablespoon of bleach to 1 quart (4 cups) of water. For a larger supply, add  cup of bleach to 1 gallon (16 cups) of water. Read labels of cleaning products and follow recommendations provided on product labels. Labels contain instructions for safe and effective use of the cleaning product including precautions you should take when applying the product, such as wearing gloves or eye protection and making sure you  have good ventilation during use of the product. Remove gloves and wash hands immediately after cleaning.  Monitor yourself for signs and symptoms of illness Caregivers and household members are considered close contacts, should monitor their health, and will be asked to limit movement outside of the home to the extent possible. Follow the monitoring steps for close contacts listed on the symptom monitoring form.   ? If you have additional questions, contact your local health department or call the epidemiologist on call at [phone] (available 24/7). ? This guidance is subject to change. For the most up-to-date guidance from [facility], please refer to their website: [email or web address]

## 2018-11-04 ENCOUNTER — Other Ambulatory Visit: Payer: Self-pay

## 2018-11-04 ENCOUNTER — Emergency Department: Payer: Self-pay

## 2018-11-04 ENCOUNTER — Encounter: Payer: Self-pay | Admitting: Emergency Medicine

## 2018-11-04 ENCOUNTER — Emergency Department
Admission: EM | Admit: 2018-11-04 | Discharge: 2018-11-04 | Disposition: A | Discharge status: Home / Self Care | Payer: Self-pay | Attending: Emergency Medicine | Admitting: Emergency Medicine

## 2018-11-04 DIAGNOSIS — R059 Cough, unspecified: Secondary | ICD-10-CM

## 2018-11-04 DIAGNOSIS — F17210 Nicotine dependence, cigarettes, uncomplicated: Secondary | ICD-10-CM | POA: Insufficient documentation

## 2018-11-04 DIAGNOSIS — R05 Cough: Secondary | ICD-10-CM

## 2018-11-04 DIAGNOSIS — Z79899 Other long term (current) drug therapy: Secondary | ICD-10-CM | POA: Insufficient documentation

## 2018-11-04 DIAGNOSIS — K2950 Unspecified chronic gastritis without bleeding: Secondary | ICD-10-CM | POA: Insufficient documentation

## 2018-11-04 DIAGNOSIS — I10 Essential (primary) hypertension: Secondary | ICD-10-CM | POA: Insufficient documentation

## 2018-11-04 DIAGNOSIS — J0111 Acute recurrent frontal sinusitis: Secondary | ICD-10-CM | POA: Insufficient documentation

## 2018-11-04 LAB — COMPREHENSIVE METABOLIC PANEL
ALT: 16 U/L (ref 0–44)
AST: 21 U/L (ref 15–41)
Albumin: 4.1 g/dL (ref 3.5–5.0)
Alkaline Phosphatase: 100 U/L (ref 38–126)
Anion gap: 8 (ref 5–15)
BUN: 10 mg/dL (ref 6–20)
CO2: 24 mmol/L (ref 22–32)
Calcium: 8.6 mg/dL — ABNORMAL LOW (ref 8.9–10.3)
Chloride: 107 mmol/L (ref 98–111)
Creatinine, Ser: 0.65 mg/dL (ref 0.44–1.00)
GFR calc Af Amer: >60 mL/min (ref >60)
GFR calc non Af Amer: >60 mL/min (ref >60)
Glucose, Bld: 111 mg/dL — ABNORMAL HIGH (ref 70–99)
Potassium: 3.8 mmol/L (ref 3.5–5.1)
Sodium: 139 mmol/L (ref 135–145)
Total Bilirubin: 0.4 mg/dL (ref 0.3–1.2)
Total Protein: 7.3 g/dL (ref 6.5–8.1)

## 2018-11-04 LAB — CBC WITH DIFFERENTIAL/PLATELET
Abs Immature Granulocytes: 0.05 10*3/uL (ref 0.00–0.07)
Basophils Absolute: 0.1 10*3/uL (ref 0.0–0.1)
Basophils Relative: 0 %
Eosinophils Absolute: 0.3 10*3/uL (ref 0.0–0.5)
Eosinophils Relative: 2 %
HCT: 43.9 % (ref 36.0–46.0)
Hemoglobin: 14.6 g/dL (ref 12.0–15.0)
Immature Granulocytes: 0 %
Lymphocytes Relative: 25 %
Lymphs Abs: 3 10*3/uL (ref 0.7–4.0)
MCH: 29.6 pg (ref 26.0–34.0)
MCHC: 33.3 g/dL (ref 30.0–36.0)
MCV: 89 fL (ref 80.0–100.0)
Monocytes Absolute: 0.7 10*3/uL (ref 0.1–1.0)
Monocytes Relative: 6 %
Neutro Abs: 7.6 10*3/uL (ref 1.7–7.7)
Neutrophils Relative %: 67 %
Platelets: 338 10*3/uL (ref 150–400)
RBC: 4.93 MIL/uL (ref 3.87–5.11)
RDW: 12.8 % (ref 11.5–15.5)
WBC: 11.7 10*3/uL — ABNORMAL HIGH (ref 4.0–10.5)
nRBC: 0 % (ref 0.0–0.2)

## 2018-11-04 MED ORDER — ONDANSETRON HCL 4 MG/2ML IJ SOLN
4.0000 mg | Freq: Once | INTRAMUSCULAR | Status: AC
  Administered 2018-11-04: 15:00:00 4 mg via INTRAVENOUS
  Filled 2018-11-04: qty 2

## 2018-11-04 MED ORDER — SODIUM CHLORIDE 0.9 % IV BOLUS
1000.0000 mL | Freq: Once | INTRAVENOUS | Status: AC
  Administered 2018-11-04: 15:00:00 1000 mL via INTRAVENOUS

## 2018-11-04 MED ORDER — AMOXICILLIN-POT CLAVULANATE 875-125 MG PO TABS: 1.0000 | ORAL_TABLET | Freq: Two times a day (BID) | ORAL | 0 refills | Status: DC

## 2018-11-04 MED ORDER — PSEUDOEPH-BROMPHEN-DM 30-2-10 MG/5ML PO SYRP: 10.0000 mL | ORAL_SOLUTION | Freq: Four times a day (QID) | ORAL | 0 refills | Status: DC | PRN

## 2018-11-04 NOTE — ED Notes (Signed)
X-ray at bedside

## 2018-11-04 NOTE — ED Provider Notes (Signed)
[facility] Emergency Department Provider Note  ____________________________________________  Time seen: Approximately 2:37 PM  I have reviewed the triage vital signs and the nursing notes.   HISTORY  Chief Complaint Nausea; Sore Throat; Shortness of Breath; and Cough    HPI Jasmine Mason is a 33 y.o. female who presents emergency department complaining of multiple medical complaints.  Patient is endorsing increased nasal congestion, sinus pressure, sinus headache, sore throat, coughing, increased abdominal pain, nausea vomiting and diarrhea.  Patient has a history of chronic gastritis, takes Protonix for same.  Patient reports that she does have a history of chronic allergies, has had increased sinus congestion, sneezing prior to onset of symptoms.  Patient denies any fevers or chills, neck pain or stiffness, visual changes, chest pain, shortness of breath, abdominal pain.  Patient has used her prescribed Phenergan for her nausea which has not resolved symptoms.   Patient has not tried any other medications for his other complaints.  She denies any use of allergy medication, Tylenol and Motrin for headache.        Past Medical History:  Diagnosis Date  . Back pain   . Back pain   . GERD (gastroesophageal reflux disease)   . Hypertension   . Mesenteric adenitis   . Obesity   . PCOS (polycystic ovarian syndrome)   . Tobacco abuse     Patient Active Problem List   Diagnosis Date Noted  . 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 Encounter for smoking cessation counseling 04/18/2017  . Essential hypertension 04/18/2017  . Epigastric abdominal pain   . Acute gastritis 09/20/2016  . Nausea with vomiting   . Hiatal hernia   . Uncontrollable vomiting 03/10/2016  . Abdominal pain, epigastric   . GERD (gastroesophageal reflux disease) 01/24/2015  . Hypokalemia 01/24/2015  . Abdominal pain 01/24/2015  . Leukocytosis 01/24/2015  . Chest pain 01/24/2015  .  Cough 01/24/2015  . Pain of left calf 01/24/2015  . Elevated troponin 01/24/2015  . Obesity   . Mesenteric adenitis   . Tobacco abuse     Past Surgical History:  Procedure Laterality Date  . CARPAL TUNNEL RELEASE Left ~ 2013  . COLONOSCOPY WITH PROPOFOL N/A 08/08/2017   Procedure: COLONOSCOPY WITH PROPOFOL;  Surgeon: Jonathon Bellows, MD;  Location[facility] ENDOSCOPY;  Service: Gastroenterology;  Laterality: N/A;  . ESOPHAGOGASTRODUODENOSCOPY N/A 01/25/2015   Procedure: ESOPHAGOGASTRODUODENOSCOPY (EGD);  Surgeon: Irene Shipper, MD;  Location: [facility] ENDOSCOPY;  Service: Endoscopy;  Laterality: N/A;  . ESOPHAGOGASTRODUODENOSCOPY (EGD) WITH PROPOFOL N/A 03/13/2016   Procedure: ESOPHAGOGASTRODUODENOSCOPY (EGD) WITH PROPOFOL;  Surgeon: Lucilla Lame, MD;  Location: [facility] ENDOSCOPY;  Service: Endoscopy;  Laterality: N/A;  . ESOPHAGOGASTRODUODENOSCOPY (EGD) WITH PROPOFOL N/A 07/05/2017   Procedure: ESOPHAGOGASTRODUODENOSCOPY (EGD) WITH PROPOFOL;  Surgeon: Jonathon Bellows, MD;  Location: [facility] ENDOSCOPY;  Service: Gastroenterology;  Laterality: N/A;  . FRACTURE SURGERY    . ORIF ANKLE FRACTURE Left 1990's    Prior to Admission medications   Medication Sig Start Date End Date Taking? Authorizing Provider  Adapalene 0.3 % gel APPLY A THIN LAYER TO FACE QHS AND [place] OFF QAM 05/29/17   [provider]  alum & mag hydroxide-simeth (MAALOX MAX) 706-237-62 MG/5ML suspension Take 5 mLs by mouth every 6 (six) hours as needed for indigestion.    [provider]  amoxicillin-clavulanate (AUGMENTIN) 875-125 MG tablet Take 1 tablet by mouth 2 (two) times daily. 11/04/18   Bryella Diviney, Charline Bills, PA-C  brompheniramine-pseudoephedrine-DM 30-2-10 MG/5ML syrup Take 10 mLs by mouth  4 (four) times daily as needed. 11/04/18   Beonka Amesquita, Charline Bills, PA-C  cyclobenzaprine (FLEXERIL) 5 MG tablet Take 1 tablet (5 mg total) by mouth 3 (three) times daily as needed for muscle spasms. 05/10/18   Menshew, Dannielle Karvonen, PA-C   dicyclomine (BENTYL) 10 MG capsule Take 1 capsule (10 mg total) by mouth 4 (four) times daily -  before meals and at bedtime. 08/05/17   Jonathon Bellows, MD  docusate sodium (COLACE) 100 MG capsule Take 1 capsule (100 mg total) by mouth 2 (two) times daily. 06/27/17   Mikey College, NP  famotidine (PEPCID) 20 MG tablet Take 1 tablet (20 mg total) by mouth daily. 09/26/18 09/26/19  Jennalynn Rivard, Charline Bills, PA-C  fluticasone (FLONASE) 50 MCG/ACT nasal spray Place 2 sprays into both nostrils daily. 06/27/17   Mikey College, NP  hydrochlorothiazide (HYDRODIURIL) 12.5 MG tablet TAKE 1 TABLET BY MOUTH ONCE DAILY 09/10/17   Mikey College, NP  HYDROcodone-acetaminophen (NORCO/VICODIN) 5-325 MG tablet Take 1-2 tablets by mouth every 6 (six) hours as needed. 09/27/14   [provider]  magic mouthwash w/lidocaine SOLN Take 5 mLs by mouth 4 (four) times daily. 01/21/18   Erryn Dickison, Charline Bills, PA-C  metoCLOPramide (REGLAN) 10 MG tablet Take 1 tablet (10 mg total) by mouth every 8 (eight) hours as needed for nausea or vomiting. 03/21/18 03/21/19  Orbie Pyo, MD  nicotine (NICODERM CQ - DOSED IN MG/24 HR) 7 mg/24hr patch Place 1 patch (7 mg total) onto the skin daily. 04/12/17   Mikey College, NP  norethindrone-ethinyl estradiol-iron (LOESTRIN FE 1.5/30) 1.5-30 MG-MCG tablet Take 1 tablet by mouth at bedtime for 28 days. 06/27/17 07/25/17  Mikey College, NP  ondansetron (ZOFRAN ODT) 4 MG disintegrating tablet Take 1 tablet (4 mg total) by mouth every 8 (eight) hours as needed for nausea or vomiting. 03/22/18   Alfred Levins, [place], MD  ondansetron (ZOFRAN) 4 MG tablet Take 1 tablet (4 mg total) by mouth every 8 (eight) hours as needed for nausea or vomiting. Patient not taking: Reported on 11/14/2017 07/24/17   Schuyler Amor, MD  ondansetron (ZOFRAN) 4 MG tablet Take 1 tablet (4 mg total) by mouth every 8 (eight) hours as needed. 10/12/18   Nance Pear, MD  pantoprazole  (PROTONIX) 20 MG tablet Take 1 tablet (20 mg total) by mouth daily. 10/13/18 10/13/19  Delman Kitten, MD  predniSONE (DELTASONE) 50 MG tablet Take 1 tablet (50 mg total) by mouth daily with breakfast. 09/26/18   Herman Mell, Charline Bills, PA-C  promethazine (PHENERGAN) 12.5 MG tablet Take 1 tablet (12.5 mg total) by mouth every 6 (six) hours as needed for nausea or vomiting. 01/09/18   Merlyn Lot, MD  promethazine (PHENERGAN) 25 MG suppository Place 1 suppository (25 mg total) rectally every 6 (six) hours as needed for nausea. 11/14/17   Carrie Mew, MD    Allergies Patient has no known allergies.  Family History  Problem Relation Age of Onset  . Hypertension Mother   . Hypertension Father   . Diabetes Father   . Heart disease Father   . Hypertension Sister   . Throat cancer Maternal Grandfather   . Stroke Paternal Grandmother   . Diabetes Paternal Grandmother   . Ovarian cancer Neg Hx   . Colon cancer Neg Hx   . Breast cancer Neg Hx     Social History Social History   Tobacco Use  . Smoking status: Current Every Day Smoker    Packs/day:  0.25    Years: 10.00    Pack years: 2.50    Types: Cigarettes  . Smokeless tobacco: Never Used  Substance Use Topics  . Alcohol use: No    Alcohol/week: 0.0 standard drinks  . Drug use: No     Review of Systems  Constitutional: No fever/chills Eyes: No visual changes. No discharge ENT: Positive for nasal congestion, sinus pressure, sore throat Cardiovascular: no chest pain. Respiratory: Positive for nonproductive cough. No SOB. Gastrointestinal: No abdominal pain.  Positive for nausea, vomiting, diarrhea..  No constipation. Genitourinary: Negative for dysuria. No hematuria Musculoskeletal: Negative for musculoskeletal pain. Skin: Negative for rash, abrasions, lacerations, ecchymosis. Neurological: Negative for headaches, focal weakness or numbness. 10-point ROS otherwise  negative.  ____________________________________________   PHYSICAL EXAM:  VITAL SIGNS: ED Triage Vitals  Enc Vitals Group     BP 11/04/18 1411 (!) 171/95     Pulse Rate 11/04/18 1411 98     Resp 11/04/18 1411 18     Temp 11/04/18 1411 98.9 F (37.2 C)     Temp Source 11/04/18 1411 Oral     SpO2 11/04/18 1411 95 %     Weight 11/04/18 1408 220 lb (99.8 kg)     Height 11/04/18 1408 5\' 7"  (1.702 m)     Head Circumference --      Peak Flow --      Pain Score 11/04/18 1407 7     Pain Loc --      Pain Edu? --      Excl. in [place]? --      Constitutional: Alert and oriented. Well appearing and in no acute distress. Eyes: Conjunctivae are normal. PERRL. EOMI. Head: Atraumatic. ENT:      Ears: EACs unremarkable bilaterally.  TMs are mildly bulging bilaterally.      Nose: Significant congestion/rhinnorhea.  Patient is tender to percussion over the frontal and maxillary sinuses.      Mouth/Throat: Mucous membranes are moist.  Oropharynx is nonerythematous and nonedematous.  Uvula is midline. Neck: No stridor.  Neck is supple full range of motion Hematological/Lymphatic/Immunilogical: No cervical lymphadenopathy. Cardiovascular: Normal rate, regular rhythm. Normal S1 and S2.  Good peripheral circulation. Respiratory: Normal respiratory effort without tachypnea or retractions. Lungs CTAB. Good air entry to the bases with no decreased or absent breath sounds. Gastrointestinal: Bowel sounds 4 quadrants. Soft and nontender to palpation. No guarding or rigidity. No palpable masses. No distention. No CVA tenderness. Musculoskeletal: Full range of motion to all extremities. No gross deformities appreciated. Neurologic:  Normal speech and language. No gross focal neurologic deficits are appreciated.  Skin:  Skin is warm, dry and intact. No rash noted. Psychiatric: Mood and affect are normal. Speech and behavior are normal. Patient exhibits appropriate insight and  judgement.   ____________________________________________   LABS (all labs ordered are listed, but only abnormal results are displayed)  Labs Reviewed  COMPREHENSIVE METABOLIC PANEL - Abnormal; Notable for the following components:      Result Value   Glucose, Bld 111 (*)    Calcium 8.6 (*)    All other components within normal limits  CBC WITH DIFFERENTIAL/PLATELET - Abnormal; Notable for the following components:   WBC 11.7 (*)    All other components within normal limits   ____________________________________________  EKG   ____________________________________________  RADIOLOGY I personally viewed and evaluated these images as part of my medical decision making, as well as reviewing the written report by the radiologist.  I concur with radiologist finding of  no acute cardiopulmonary abnormality to the chest.  Dg Chest Port 1 View  Result Date: 11/04/2018 CLINICAL DATA:  Cough which shortness-of-breath and sore throat as well as diarrhea 2 days. EXAM: PORTABLE CHEST 1 VIEW COMPARISON:  10/13/2018 FINDINGS: Lungs are adequately inflated without consolidation or effusion. Cardiomediastinal silhouette and remainder the exam is unchanged. IMPRESSION: No active disease. Electronically Signed   By: Marin Olp M.D.   On: 11/04/2018 14:52    ____________________________________________    PROCEDURES  Procedure(s) performed:    Procedures    Medications  sodium chloride 0.9 % bolus 1,000 mL (1,000 mLs Intravenous New Bag/Given 11/04/18 1454)  ondansetron (ZOFRAN) injection 4 mg (4 mg Intravenous Given 11/04/18 1454)     ____________________________________________   INITIAL IMPRESSION / ASSESSMENT AND PLAN / ED COURSE  Pertinent labs & imaging results that were available during my care of the patient were reviewed by me and considered in my medical decision making (see chart for details).  Review of the [place] CSRS was performed in accordance of the [place] prior to  dispensing any controlled drugs.    The patient was evaluated for the symptoms described in the history of present illness. The patient was evaluated in the context of the global COVID-19 pandemic, which necessitated consideration that the patient might be at risk for infection with the SARS-CoV-2 virus that causes COVID-19. Institutional protocols and algorithms that pertain to the evaluation of patients at risk for COVID-19 are in a state of rapid change based on information released by regulatory bodies including the CDC and federal and state organizations. The most current policies and algorithms were followed during the patient's care in the ED.   Patient's vital signs are stable with no significant tachypnea and no significant tachycardia. PERC negative.  Low risk for ACS. No recent high-risk travel or sick contacts that would suggest an increased risk of COVID-19.  This patient does not currently meet CDC criteria for testing and I explained that in detail.  The work-up/exam today is reassuring with no evidence of emergent medical condition that requires further work-up, evaluation, or inpatient treatment other than what has been ordered/performed.        Patient's diagnosis is consistent with sinusitis.  Patient presented to emergency department with complaint of nasal congestion, sinus pressure, sore throat, cough as well as increased GI symptoms.  Patient was having no pain to the abdomen.  She has a history of chronic gastritis with chronic nausea vomiting and diarrhea.  Differential included viral gastroenteritis, gastric ulcer, cholecystitis, viral URI, strep, sinusitis, pneumonia, COVID.Marland Kitchen  Exam is most consistent with bacterial sinusitis.  Patient was given antibiotics, Bromfed cough syrup for symptom relief.  Patient has PPI as well as antiemetics at home for her chronic gastritis.  Patient is agreeable to plan.  Follow-up with primary care as needed.   Patient is given ED precautions to  return to the ED for any worsening or new symptoms.     ____________________________________________  FINAL CLINICAL IMPRESSION(S) / ED DIAGNOSES  Final diagnoses:  Acute recurrent frontal sinusitis  Chronic gastritis, presence of bleeding unspecified, unspecified gastritis type      NEW MEDICATIONS STARTED DURING THIS VISIT:  ED Discharge Orders         Ordered    amoxicillin-clavulanate (AUGMENTIN) 875-125 MG tablet  2 times daily     11/04/18 1536    brompheniramine-pseudoephedrine-DM 30-2-10 MG/5ML syrup  4 times daily PRN     11/04/18 1536  This chart was dictated using voice recognition software/Dragon. Despite best efforts to proofread, errors can occur which can change the meaning. Any change was purely unintentional.    Darletta Moll, PA-C 11/04/18 1537    Eula Listen, MD 11/04/18 1553

## 2018-11-04 NOTE — ED Triage Notes (Signed)
Cough, short of breath, sore throat and diarrhea for 2 days.

## 2018-11-05 ENCOUNTER — Emergency Department
Admission: EM | Admit: 2018-11-05 | Discharge: 2018-11-05 | Disposition: A | Discharge status: Home / Self Care | Payer: Self-pay | Attending: Emergency Medicine | Admitting: Emergency Medicine

## 2018-11-05 ENCOUNTER — Other Ambulatory Visit: Payer: Self-pay

## 2018-11-05 ENCOUNTER — Encounter: Payer: Self-pay | Admitting: *Deleted

## 2018-11-05 DIAGNOSIS — Z5321 Procedure and treatment not carried out due to patient leaving prior to being seen by health care provider: Secondary | ICD-10-CM | POA: Insufficient documentation

## 2018-11-05 DIAGNOSIS — R1110 Vomiting, unspecified: Secondary | ICD-10-CM | POA: Insufficient documentation

## 2018-11-05 DIAGNOSIS — R109 Unspecified abdominal pain: Secondary | ICD-10-CM | POA: Insufficient documentation

## 2018-11-05 LAB — URINALYSIS, COMPLETE (UACMP) WITH MICROSCOPIC
Bilirubin Urine: NEGATIVE
Glucose, UA: NEGATIVE mg/dL
Hgb urine dipstick: NEGATIVE
Ketones, ur: NEGATIVE mg/dL
Leukocytes,Ua: NEGATIVE
Nitrite: NEGATIVE
Protein, ur: 30 mg/dL — AB
Specific Gravity, Urine: 1.024 (ref 1.005–1.030)
pH: 7 (ref 5.0–8.0)

## 2018-11-05 LAB — COMPREHENSIVE METABOLIC PANEL
ALT: 18 U/L (ref 0–44)
AST: 17 U/L (ref 15–41)
Albumin: 4.2 g/dL (ref 3.5–5.0)
Alkaline Phosphatase: 103 U/L (ref 38–126)
Anion gap: 9 (ref 5–15)
BUN: 9 mg/dL (ref 6–20)
CO2: 25 mmol/L (ref 22–32)
Calcium: 8.7 mg/dL — ABNORMAL LOW (ref 8.9–10.3)
Chloride: 106 mmol/L (ref 98–111)
Creatinine, Ser: 0.74 mg/dL (ref 0.44–1.00)
GFR calc Af Amer: >60 mL/min (ref >60)
GFR calc non Af Amer: >60 mL/min (ref >60)
Glucose, Bld: 118 mg/dL — ABNORMAL HIGH (ref 70–99)
Potassium: 3.8 mmol/L (ref 3.5–5.1)
Sodium: 140 mmol/L (ref 135–145)
Total Bilirubin: 0.6 mg/dL (ref 0.3–1.2)
Total Protein: 7.7 g/dL (ref 6.5–8.1)

## 2018-11-05 LAB — CBC
HCT: 44.2 % (ref 36.0–46.0)
Hemoglobin: 14.6 g/dL (ref 12.0–15.0)
MCH: 29.8 pg (ref 26.0–34.0)
MCHC: 33 g/dL (ref 30.0–36.0)
MCV: 90.2 fL (ref 80.0–100.0)
Platelets: 345 10*3/uL (ref 150–400)
RBC: 4.9 MIL/uL (ref 3.87–5.11)
RDW: 12.8 % (ref 11.5–15.5)
WBC: 15.1 10*3/uL — ABNORMAL HIGH (ref 4.0–10.5)
nRBC: 0 % (ref 0.0–0.2)

## 2018-11-05 LAB — LIPASE, BLOOD: Lipase: 39 U/L (ref 11–51)

## 2018-11-05 LAB — POCT PREGNANCY, URINE: Preg Test, Ur: NEGATIVE

## 2018-11-05 MED ORDER — SODIUM CHLORIDE 0.9% FLUSH: 3.0000 mL | Freq: Once | INTRAVENOUS | Status: DC

## 2018-11-05 NOTE — ED Notes (Signed)
Spoke with pt about wait times and what to expect next. Advised pt that I am available for further questions if needed. Pt became very angry and stated that we have been taking everyone back to a room and just leaving her out here to suffer. I advised pt that she should be next to go back, and that no one has been taken ahead of her. Pt said "whatever, I won't forget the way ya'll are treating me".

## 2018-11-05 NOTE — ED Notes (Signed)
Pt has not returned to ED lobby 

## 2018-11-05 NOTE — ED Notes (Signed)
Pt amb to STAT desk without difficulty or distress noted, requesting department director's number; asked pt if she had a concern, question or complain but pt declines to elaborate, stating she just needed the number; informed pt to main ED number and that would direct her to the appropriate person to speak with tomorrow; updated pt on wait time

## 2018-11-05 NOTE — ED Notes (Signed)
While assisting another pt out of vehicle outside ED lobby with aid of lobby officer, pt apparently went to STAT registration clerk upset because "everyone has gone back ahead of her"; requested names of herself and this nurse, clerk attempted to explain that no one had gone before her, only to triage but pt cont to be irrate and loud, stating that "sick people are not getting seen and she is going to talk to the nursing director about this"

## 2018-11-05 NOTE — ED Notes (Signed)
Pt ambulatory to lobby officer desk, requesting his name then noted leaving ED lobby

## 2018-11-05 NOTE — ED Triage Notes (Signed)
Pt has abd pain with vomiting that began today.  Pt also has diarrhea x 5.  Pt alert   Speech clear.

## 2018-11-06 ENCOUNTER — Telehealth: Payer: Self-pay | Admitting: Emergency Medicine

## 2018-11-06 NOTE — Telephone Encounter (Signed)
Called patient due to lwot to inquire about condition and follow up plans. Left message.   

## 2019-01-12 ENCOUNTER — Emergency Department (HOSPITAL_BASED_OUTPATIENT_CLINIC_OR_DEPARTMENT_OTHER): Payer: Self-pay

## 2019-01-12 ENCOUNTER — Encounter (HOSPITAL_BASED_OUTPATIENT_CLINIC_OR_DEPARTMENT_OTHER): Payer: Self-pay

## 2019-01-12 ENCOUNTER — Emergency Department (HOSPITAL_BASED_OUTPATIENT_CLINIC_OR_DEPARTMENT_OTHER)
Admission: EM | Admit: 2019-01-12 | Discharge: 2019-01-12 | Disposition: A | Discharge status: Home / Self Care | Payer: Self-pay | Attending: Emergency Medicine | Admitting: Emergency Medicine

## 2019-01-12 ENCOUNTER — Other Ambulatory Visit: Payer: Self-pay

## 2019-01-12 ENCOUNTER — Telehealth: Payer: Self-pay

## 2019-01-12 DIAGNOSIS — Y92512 Supermarket, store or market as the place of occurrence of the external cause: Secondary | ICD-10-CM | POA: Insufficient documentation

## 2019-01-12 DIAGNOSIS — Y998 Other external cause status: Secondary | ICD-10-CM | POA: Insufficient documentation

## 2019-01-12 DIAGNOSIS — Y9389 Activity, other specified: Secondary | ICD-10-CM | POA: Insufficient documentation

## 2019-01-12 DIAGNOSIS — I10 Essential (primary) hypertension: Secondary | ICD-10-CM | POA: Insufficient documentation

## 2019-01-12 DIAGNOSIS — S60211A Contusion of right wrist, initial encounter: Secondary | ICD-10-CM | POA: Insufficient documentation

## 2019-01-12 DIAGNOSIS — W228XXA Striking against or struck by other objects, initial encounter: Secondary | ICD-10-CM | POA: Insufficient documentation

## 2019-01-12 DIAGNOSIS — F17210 Nicotine dependence, cigarettes, uncomplicated: Secondary | ICD-10-CM | POA: Insufficient documentation

## 2019-01-12 DIAGNOSIS — Z79899 Other long term (current) drug therapy: Secondary | ICD-10-CM | POA: Insufficient documentation

## 2019-01-12 MED ORDER — ACETAMINOPHEN 500 MG PO TABS
1000.0000 mg | ORAL_TABLET | Freq: Once | ORAL | Status: AC
  Administered 2019-01-12: 1000 mg via ORAL
  Filled 2019-01-12: qty 2

## 2019-01-12 NOTE — ED Triage Notes (Signed)
Pt states she sustained injury to right wrist when a mannequin head fell on it ~2-3 hours PTA-no break in skin-NAD-steady gait

## 2019-01-12 NOTE — Discharge Instructions (Addendum)
It was our pleasure to provide your ER care today - we hope that you feel better.  Ice/coldpack to sore area. Take acetaminophen or ibuprofen as need.  Your xrays look good - no fracture is seen.  Return to ER if worse, new symptoms, other concern.

## 2019-01-12 NOTE — ED Provider Notes (Signed)
[facility]PART[facility] Provider Note   CSN: [id number] Arrival date & time: 01/12/19  1335     History   Chief Complaint Chief Complaint  Patient presents with  . Wrist Injury    HPI Jasmine Mason is a 32 y.o. female.     Patient s/p injury to right wrist today. Was at a store, a mannequin fell against right wrist, pt c/o acute onset pain to right wrist, constant, dull, moderate, worse w certain movements and palpation. Skin intact. Denies numbness/weakness or limited rom. Denies other pain or injury.   The history is provided by the patient.  Wrist Injury Associated symptoms: no fever     Past Medical History:  Diagnosis Date  . Back pain   . Back pain   . GERD (gastroesophageal reflux disease)   . Hypertension   . Mesenteric adenitis   . Obesity   . PCOS (polycystic ovarian syndrome)   . Tobacco abuse     Patient Active Problem List   Diagnosis Date Noted  . 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 Encounter for smoking cessation counseling 04/18/2017  . Essential hypertension 04/18/2017  . Epigastric abdominal pain   . Acute gastritis 09/20/2016  . Nausea with vomiting   . Hiatal hernia   . Uncontrollable vomiting 03/10/2016  . Abdominal pain, epigastric   . GERD (gastroesophageal reflux disease) 01/24/2015  . Hypokalemia 01/24/2015  . Abdominal pain 01/24/2015  . Leukocytosis 01/24/2015  . Chest pain 01/24/2015  . Cough 01/24/2015  . Pain of left calf 01/24/2015  . Elevated troponin 01/24/2015  . Obesity   . Mesenteric adenitis   . Tobacco abuse     Past Surgical History:  Procedure Laterality Date  . CARPAL TUNNEL RELEASE Left ~ 2013  . COLONOSCOPY WITH PROPOFOL N/A 08/08/2017   Procedure: COLONOSCOPY WITH PROPOFOL;  Surgeon: Jonathon Bellows, MD;  Location: [facility] END[facility];  Service: Gastroenterology;  Laterality: N/A;  . ESOPHAGOGASTRODUODENOSCOPY N/A 01/25/2015   Procedure: ESOPHAGOGASTRODUODENOSCOPY (EGD);  Surgeon: Irene Shipper,  MD;  Location: [facility] ENDOSCOPY;  Service: Endoscopy;  Laterality: N/A;  . ESOPHAGOGASTRODUODENOSCOPY (EGD) WITH PROPOFOL N/A 03/13/2016   Procedure: ESOPHAGOGASTRODUODENOSCOPY (EGD) WITH PROPOFOL;  Surgeon: Lucilla Lame, MD;  Location: [facility] ENDOSCOPY;  Service: Endoscopy;  Laterality: N/A;  . ESOPHAGOGASTRODUODENOSCOPY (EGD) WITH PROPOFOL N/A 07/05/2017   Procedure: ESOPHAGOGASTRODUODENOSCOPY (EGD) WITH PROPOFOL;  Surgeon: Jonathon Bellows, MD;  Location: [facility] ENDOSCOPY;  Service: Gastroenterology;  Laterality: N/A;  . FRACTURE SURGERY    . ORIF ANKLE FRACTURE Left 1990's     OB History    Gravida  0   Para  0   Term  0   Preterm  0   AB  0   Living  0     SAB  0   TAB  0   Ectopic  0   Multiple  0   Live Births  0            Home Medications    Prior to Admission medications   Medication Sig Start Date End Date Taking? Authorizing Provider  Adapalene 0.3 % gel APPLY A THIN LAYER TO FACE QHS AND Raymond OFF QAM 05/29/17   [provider]  alum & mag hydroxide-simeth (MAALOX MAX) 992-426-83 MG/5ML suspension Take 5 mLs by mouth every 6 (six) hours as needed for indigestion.    [provider]  amoxicillin-clavulanate (AUGMENTIN) 875-125 MG tablet Take 1 tablet by mouth 2 (two) times daily. 11/04/18   Cuthriell, Charline Bills, PA-C  brompheniramine-pseudoephedrine-DM 30-2-10 MG/5ML syrup Take 10 mLs by mouth 4 (four) times daily as needed. 11/04/18   Cuthriell, Charline Bills, PA-C  cyclobenzaprine (FLEXERIL) 5 MG tablet Take 1 tablet (5 mg total) by mouth 3 (three) times daily as needed for muscle spasms. 05/10/18   Menshew, Dannielle Karvonen, PA-C  dicyclomine (BENTYL) 10 MG capsule Take 1 capsule (10 mg total) by mouth 4 (four) times daily -  before meals and at bedtime. 08/05/17   Jonathon Bellows, MD  docusate sodium (COLACE) 100 MG capsule Take 1 capsule (100 mg total) by mouth 2 (two) times daily. 06/27/17   Mikey College, NP  famotidine (PEPCID) 20 MG tablet Take 1  tablet (20 mg total) by mouth daily. 09/26/18 09/26/19  Cuthriell, Charline Bills, PA-C  fluticasone (FLONASE) 50 MCG/ACT nasal spray Place 2 sprays into both nostrils daily. 06/27/17   Mikey College, NP  hydrochlorothiazide (HYDRODIURIL) 12.5 MG tablet TAKE 1 TABLET BY MOUTH ONCE DAILY 09/10/17   Mikey College, NP  HYDROcodone-acetaminophen (NORCO/VICODIN) 5-325 MG tablet Take 1-2 tablets by mouth every 6 (six) hours as needed. 09/27/14   [provider]  magic mouthwash w/lidocaine SOLN Take 5 mLs by mouth 4 (four) times daily. 01/21/18   Cuthriell, Charline Bills, PA-C  metoCLOPramide (REGLAN) 10 MG tablet Take 1 tablet (10 mg total) by mouth every 8 (eight) hours as needed for nausea or vomiting. 03/21/18 03/21/19  Orbie Pyo, MD  nicotine (NICODERM CQ - DOSED IN MG/24 HR) 7 mg/24hr patch Place 1 patch (7 mg total) onto the skin daily. 04/12/17   Mikey College, NP  norethindrone-ethinyl estradiol-iron (LOESTRIN FE 1.5/30) 1.5-30 MG-MCG tablet Take 1 tablet by mouth at bedtime for 28 days. 06/27/17 07/25/17  Mikey College, NP  ondansetron (ZOFRAN ODT) 4 MG disintegrating tablet Take 1 tablet (4 mg total) by mouth every 8 (eight) hours as needed for nausea or vomiting. 03/22/18   Alfred Levins, [place], MD  ondansetron (ZOFRAN) 4 MG tablet Take 1 tablet (4 mg total) by mouth every 8 (eight) hours as needed for nausea or vomiting. Patient not taking: Reported on 11/14/2017 07/24/17   Schuyler Amor, MD  ondansetron (ZOFRAN) 4 MG tablet Take 1 tablet (4 mg total) by mouth every 8 (eight) hours as needed. 10/12/18   Nance Pear, MD  pantoprazole (PROTONIX) 20 MG tablet Take 1 tablet (20 mg total) by mouth daily. 10/13/18 10/13/19  Delman Kitten, MD  predniSONE (DELTASONE) 50 MG tablet Take 1 tablet (50 mg total) by mouth daily with breakfast. 09/26/18   Cuthriell, Charline Bills, PA-C  promethazine (PHENERGAN) 12.5 MG tablet Take 1 tablet (12.5 mg total) by mouth every 6 (six) hours  as needed for nausea or vomiting. 01/09/18   Merlyn Lot, MD  promethazine (PHENERGAN) 25 MG suppository Place 1 suppository (25 mg total) rectally every 6 (six) hours as needed for nausea. 11/14/17   Carrie Mew, MD    Family History Family History  Problem Relation Age of Onset  . Hypertension Mother   . Hypertension Father   . Diabetes Father   . Heart disease Father   . Hypertension Sister   . Throat cancer Maternal Grandfather   . Stroke Paternal Grandmother   . Diabetes Paternal Grandmother   . Ovarian cancer Neg Hx   . Colon cancer Neg Hx   . Breast cancer Neg Hx     Social History Social History   Tobacco Use  . Smoking status: Current Every Day Smoker  Packs/day: 0.25    Years: 10.00    Pack years: 2.50    Types: Cigarettes  . Smokeless tobacco: Never Used  Substance Use Topics  . Alcohol use: No    Alcohol/week: 0.0 standard drinks  . Drug use: No     Allergies   Onion   Review of Systems Review of Systems  Constitutional: Negative for fever.  Gastrointestinal: Negative for nausea.  Skin: Negative for wound.  Neurological: Negative for weakness and numbness.     Physical Exam Updated Vital Signs BP 129/84 (BP Location: Left Arm)   Pulse 92   Temp 98 F (36.7 C) (Oral)   Resp 20   Ht 1.702 m (5\' 7" )   Wt 99.8 kg   LMP 12/18/2018   SpO2 100%   BMI 34.46 kg/m   Physical Exam Vitals signs and nursing note reviewed.  Constitutional:      Appearance: Normal appearance. She is well-developed.  HENT:     Head: Atraumatic.     Nose: Nose normal.     Mouth/Throat:     Mouth: Mucous membranes are moist.  Eyes:     General: No scleral icterus.    Conjunctiva/sclera: Conjunctivae normal.  Neck:     Musculoskeletal: Normal range of motion.     Trachea: No tracheal deviation.  Cardiovascular:     Rate and Rhythm: Normal rate.     Pulses: Normal pulses.  Pulmonary:     Effort: Pulmonary effort is normal. No respiratory distress.   Abdominal:     General: There is no distension.     Palpations: Abdomen is soft.  Genitourinary:    Comments: No cva tenderness.  Musculoskeletal:        General: No swelling.     Comments: Tenderness right wrist. No focal scaphoid tenderness. Skin intact. Radial pulse 2+. Normal cap refill distally.   Skin:    General: Skin is warm and dry.     Findings: No rash.  Neurological:     Mental Status: She is alert.     Comments: Alert, speech normal. Right hand, motor intact, stre 5/5. sens grossly intact. Specifically right hand, u/m/r nerve fxn, motor/sens intact.   Psychiatric:        Mood and Affect: Mood normal.      ED Treatments / Results  Labs (all labs ordered are listed, but only abnormal results are displayed) Labs Reviewed - No data to display  EKG    Radiology Dg Wrist Complete Right  Result Date: 01/12/2019 CLINICAL DATA:  Right wrist pain after blunt trauma today. Swelling. EXAM: RIGHT WRIST - COMPLETE 3+ VIEW COMPARISON:  Hand radiographs dated 10/22/2017 FINDINGS: There is no evidence of fracture or dislocation. There is no evidence of arthropathy or other focal bone abnormality. Soft tissues are unremarkable. IMPRESSION: Negative. Electronically Signed   By: Lorriane Shire M.D.   On: 01/12/2019 14:18    Procedures Procedures (including critical care time)  Medications Ordered in ED Medications  acetaminophen (TYLENOL) tablet 1,000 mg (has no administration in time range)     Initial Impression / Assessment and Plan / ED Course  I have reviewed the triage vital signs and the nursing notes.  Pertinent labs & imaging results that were available during my care of the patient were reviewed by me and considered in my medical decision making (see chart for details).  xrays reviewed by me - no fracture.   icepack to sore area. Motrin po.   Reviewed nursing notes  and prior charts for additional history.   Discussed xrays w pt.   Exam most c/w contusion  to wrist.     Final Clinical Impressions(s) / ED Diagnoses   Final diagnoses:  None    ED Discharge Orders    None       Lajean Saver, MD 01/12/19 1627

## 2019-01-12 NOTE — TOC Initial Note (Signed)
Transition of Care [facility]) - Initial/Assessment Note    Patient Details  Name: Jasmine Mason MRN: [id number] Date of Birth: 27-Jun-1986  [facility]) CM/SW Contact:    Erenest Rasher, RN Phone Number: [phone] 01/12/2019, 4:14 PM  Clinical Narrative:   8 ED visits in past 6 months/no insurance               Spoke to pt and states she attempted to follow up with her PCP but the could not schedule appt until 6/23. States her wrist kept swelling. Attempted to call PCP's office for a follow up appt but office closed. Info placed on her dc instructions to contact PCP for follow up appt.  Expected Discharge Plan: Home/Self Care Barriers to Discharge: Continued Medical Work up   Patient Goals and CMS Choice        Expected Discharge Plan and Services Expected Discharge Plan: Home/Self Care   Discharge Planning Services: CM Consult                                          Prior Living Arrangements/Services   Lives with:: Self Patient language and need for interpreter reviewed:: Yes        Need for Family Participation in Patient Care: No (Comment) Care giver support system in place?: No (comment)   Criminal Activity/Legal Involvement Pertinent to Current Situation/Hospitalization: No - Comment as needed  Activities of Daily Living      Permission Sought/Granted Permission sought to share information with : Case Manager, PCP Permission granted to share information with : Yes, Verbal Permission Granted              Emotional Assessment   Attitude/Demeanor/Rapport: Engaged Affect (typically observed): Accepting Orientation: : Oriented to Self, Oriented to Place, Oriented to  Time      Admission diagnosis:  RIGHT WRIST PAIN Patient Active Problem List   Diagnosis Date Noted  . 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 Encounter for smoking cessation counseling 04/18/2017  . Essential hypertension 04/18/2017  . Epigastric abdominal pain    . Acute gastritis 09/20/2016  . Nausea with vomiting   . Hiatal hernia   . Uncontrollable vomiting 03/10/2016  . Abdominal pain, epigastric   . GERD (gastroesophageal reflux disease) 01/24/2015  . Hypokalemia 01/24/2015  . Abdominal pain 01/24/2015  . Leukocytosis 01/24/2015  . Chest pain 01/24/2015  . Cough 01/24/2015  . Pain of left calf 01/24/2015  . Elevated troponin 01/24/2015  . Obesity   . Mesenteric adenitis   . Tobacco abuse    PCP:  Mikey College, NP Pharmacy:   [facility] [phone] [address], [place] [address] Phone: [phone] Fax: [place], [place] [address] [place] [address] Phone: [phone] Fax: [phone]     Social Determinants of Health (SDOH) Interventions    Readmission Risk Interventions No flowsheet data found.

## 2019-01-12 NOTE — Telephone Encounter (Addendum)
The pt called complaining of swelling in her Rt hand. She states that a manikin head fell down on her hand while shopping. She complains of swelling, pain and throbbing. I informed the patient that we didn't have any appt available for today, but we do have an appt for tomorrow. The pt declined the appt. She said she will just go to the Urgent Care.

## 2019-01-14 ENCOUNTER — Telehealth: Payer: Self-pay

## 2019-01-14 ENCOUNTER — Emergency Department
Admission: EM | Admit: 2019-01-14 | Discharge: 2019-01-14 | Disposition: A | Discharge status: Home / Self Care | Payer: Self-pay | Attending: Emergency Medicine | Admitting: Emergency Medicine

## 2019-01-14 ENCOUNTER — Other Ambulatory Visit: Payer: Self-pay

## 2019-01-14 DIAGNOSIS — M25531 Pain in right wrist: Secondary | ICD-10-CM | POA: Insufficient documentation

## 2019-01-14 DIAGNOSIS — Z79899 Other long term (current) drug therapy: Secondary | ICD-10-CM | POA: Insufficient documentation

## 2019-01-14 DIAGNOSIS — S60211D Contusion of right wrist, subsequent encounter: Secondary | ICD-10-CM | POA: Insufficient documentation

## 2019-01-14 DIAGNOSIS — R200 Anesthesia of skin: Secondary | ICD-10-CM | POA: Insufficient documentation

## 2019-01-14 DIAGNOSIS — I10 Essential (primary) hypertension: Secondary | ICD-10-CM | POA: Insufficient documentation

## 2019-01-14 DIAGNOSIS — W228XXD Striking against or struck by other objects, subsequent encounter: Secondary | ICD-10-CM | POA: Insufficient documentation

## 2019-01-14 DIAGNOSIS — F17210 Nicotine dependence, cigarettes, uncomplicated: Secondary | ICD-10-CM | POA: Insufficient documentation

## 2019-01-14 MED ORDER — NAPROXEN 500 MG PO TABS: 500.0000 mg | ORAL_TABLET | Freq: Two times a day (BID) | ORAL | 0 refills | Status: DC

## 2019-01-14 MED ORDER — TRAMADOL HCL 50 MG PO TABS: 50.0000 mg | ORAL_TABLET | Freq: Four times a day (QID) | ORAL | 0 refills | Status: DC | PRN

## 2019-01-14 NOTE — Discharge Instructions (Signed)
Ice and elevate your wrist as needed for swelling and help with pain.  Wear the cock-up wrist splint for support and protection.  Begin taking naproxen 500 mg twice daily with food.  Tramadol is for pain.  Do not take that medication and drive or operate machinery as it could cause drowsiness and increase your risk for injury.  You may follow-up with your primary care provider if any additional medication is needed.  You also need to schedule appointment with the orthopedist, Dr. Harlow Mares whose contact information is listed on your discharge papers if you are not improving in 2 to 3 weeks.

## 2019-01-14 NOTE — ED Triage Notes (Signed)
Pt states she was in a store on Monday shopping and someone got something off this high shelf and dropped it on her right wrist.

## 2019-01-14 NOTE — Telephone Encounter (Signed)
Patient was in ER day before yesterday for hand and wrist pain they have done couple of tests but her pain and swelling and numbness is getting worst so advised her to go back to ED with pain scale 9-10.

## 2019-01-14 NOTE — ED Provider Notes (Signed)
[facility] Emergency Department Provider Note  ____________________________________________   First MD Initiated Contact with Patient 01/14/19 1006     (approximate)  I have reviewed the triage vital signs and the nursing notes.   HISTORY  Chief Complaint Wrist Injury   HPI Jasmine Mason is a 33 y.o. female presents to the ED with complaint of right wrist pain.  Patient states that she was shopping 3 days ago and someone took an item off a shelf and dropped it on her right wrist.  She is continued to have pain since that time.  Patient was seen on 01/12/2019 for the same accident and x-rays were negative.  Patient states she is continued to have pain since that time and reports some intermittent sensation of numbness in the area.  Patient denies any difficulty moving her digits.  She rates her pain as an 8 out of 10.     Past Medical History:  Diagnosis Date  . Back pain   . Back pain   . GERD (gastroesophageal reflux disease)   . Hypertension   . Mesenteric adenitis   . Obesity   . PCOS (polycystic ovarian syndrome)   . Tobacco abuse     Patient Active Problem List   Diagnosis Date Noted  . 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 Encounter for smoking cessation counseling 04/18/2017  . Essential hypertension 04/18/2017  . Epigastric abdominal pain   . Acute gastritis 09/20/2016  . Nausea with vomiting   . Hiatal hernia   . Uncontrollable vomiting 03/10/2016  . Abdominal pain, epigastric   . GERD (gastroesophageal reflux disease) 01/24/2015  . Hypokalemia 01/24/2015  . Abdominal pain 01/24/2015  . Leukocytosis 01/24/2015  . Chest pain 01/24/2015  . Cough 01/24/2015  . Pain of left calf 01/24/2015  . Elevated troponin 01/24/2015  . Obesity   . Mesenteric adenitis   . Tobacco abuse     Past Surgical History:  Procedure Laterality Date  . CARPAL TUNNEL RELEASE Left ~ 2013  . COLONOSCOPY WITH PROPOFOL N/A 08/08/2017   Procedure:  COLONOSCOPY WITH PROPOFOL;  Surgeon: Jonathon Bellows, MD;  Location: [facility] ENDOSCOPY;  Service: Gastroenterology;  Laterality: N/A;  . ESOPHAGOGASTRODUODENOSCOPY N/A 01/25/2015   Procedure: ESOPHAGOGASTRODUODENOSCOPY (EGD);  Surgeon: Irene Shipper, MD;  Location: [facility] ENDOSCOPY;  Service: Endoscopy;  Laterality: N/A;  . ESOPHAGOGASTRODUODENOSCOPY (EGD) WITH PROPOFOL N/A 03/13/2016   Procedure: ESOPHAGOGASTRODUODENOSCOPY (EGD) WITH PROPOFOL;  Surgeon: Lucilla Lame, MD;  Location: [facility] ENDOSCOPY;  Service: Endoscopy;  Laterality: N/A;  . ESOPHAGOGASTRODUODENOSCOPY (EGD) WITH PROPOFOL N/A 07/05/2017   Procedure: ESOPHAGOGASTRODUODENOSCOPY (EGD) WITH PROPOFOL;  Surgeon: Jonathon Bellows, MD;  Location: [facility] ENDOSCOPY;  Service: Gastroenterology;  Laterality: N/A;  . FRACTURE SURGERY    . ORIF ANKLE FRACTURE Left 1990's    Prior to Admission medications   Medication Sig Start Date End Date Taking? Authorizing Provider  Adapalene 0.3 % gel APPLY A THIN LAYER TO FACE QHS AND [place] OFF QAM 05/29/17   [provider]  alum & mag hydroxide-simeth (MAALOX MAX) 700-174-94 MG/5ML suspension Take 5 mLs by mouth every 6 (six) hours as needed for indigestion.    [provider]  dicyclomine (BENTYL) 10 MG capsule Take 1 capsule (10 mg total) by mouth 4 (four) times daily -  before meals and at bedtime. 08/05/17   Jonathon Bellows, MD  docusate sodium (COLACE) 100 MG capsule Take 1 capsule (100 mg total) by mouth 2 (two) times daily. 06/27/17   Mikey College, NP  fluticasone (FLONASE) 50 MCG/ACT nasal spray Place 2 sprays into both nostrils daily. 06/27/17   Mikey College, NP  hydrochlorothiazide (HYDRODIURIL) 12.5 MG tablet TAKE 1 TABLET BY MOUTH ONCE DAILY 09/10/17   Mikey College, NP  HYDROcodone-acetaminophen (NORCO/VICODIN) 5-325 MG tablet Take 1-2 tablets by mouth every 6 (six) hours as needed. 09/27/14   [provider]  metoCLOPramide (REGLAN) 10 MG tablet Take 1 tablet (10 mg total)  by mouth every 8 (eight) hours as needed for nausea or vomiting. 03/21/18 03/21/19  Orbie Pyo, MD  naproxen (NAPROSYN) 500 MG tablet Take 1 tablet (500 mg total) by mouth 2 (two) times daily with a meal. 01/14/19   Johnn Hai, PA-C  nicotine (NICODERM CQ - DOSED IN MG/24 HR) 7 mg/24hr patch Place 1 patch (7 mg total) onto the skin daily. 04/12/17   Mikey College, NP  norethindrone-ethinyl estradiol-iron (LOESTRIN FE 1.5/30) 1.5-30 MG-MCG tablet Take 1 tablet by mouth at bedtime for 28 days. 06/27/17 07/25/17  Mikey College, NP  pantoprazole (PROTONIX) 20 MG tablet Take 1 tablet (20 mg total) by mouth daily. 10/13/18 10/13/19  Delman Kitten, MD  traMADol (ULTRAM) 50 MG tablet Take 1 tablet (50 mg total) by mouth every 6 (six) hours as needed for moderate pain. 01/14/19   Johnn Hai, PA-C  famotidine (PEPCID) 20 MG tablet Take 1 tablet (20 mg total) by mouth daily. 09/26/18 01/14/19  Cuthriell, Charline Bills, PA-C  promethazine (PHENERGAN) 12.5 MG tablet Take 1 tablet (12.5 mg total) by mouth every 6 (six) hours as needed for nausea or vomiting. 01/09/18 01/14/19  Merlyn Lot, MD    Allergies Onion  Family History  Problem Relation Age of Onset  . Hypertension Mother   . Hypertension Father   . Diabetes Father   . Heart disease Father   . Hypertension Sister   . Throat cancer Maternal Grandfather   . Stroke Paternal Grandmother   . Diabetes Paternal Grandmother   . Ovarian cancer Neg Hx   . Colon cancer Neg Hx   . Breast cancer Neg Hx     Social History Social History   Tobacco Use  . Smoking status: Current Every Day Smoker    Packs/day: 0.25    Years: 10.00    Pack years: 2.50    Types: Cigarettes  . Smokeless tobacco: Never Used  Substance Use Topics  . Alcohol use: No    Alcohol/week: 0.0 standard drinks  . Drug use: No    Review of Systems Constitutional: No fever/chills Cardiovascular: Denies chest pain. Respiratory: Denies shortness  of breath. Musculoskeletal: Positive for right wrist pain. Skin: Negative for rash. Neurological: Negative for headaches. ___________________________________________   PHYSICAL EXAM:  VITAL SIGNS: ED Triage Vitals  Enc Vitals Group     BP 01/14/19 1000 (!) 144/93     Pulse Rate 01/14/19 1000 94     Resp 01/14/19 1000 19     Temp 01/14/19 1000 98.2 F (36.8 C)     Temp Source 01/14/19 1000 Oral     SpO2 01/14/19 1000 99 %     Weight 01/14/19 0957 220 lb (99.8 kg)     Height 01/14/19 0957 5\' 7"  (1.702 m)     Head Circumference --      Peak Flow --      Pain Score 01/14/19 0957 8     Pain Loc --      Pain Edu? --      Excl. in  GC? --     Constitutional: Alert and oriented. Well appearing and in no acute distress. Eyes: Conjunctivae are normal.  Head: Atraumatic. Neck: No stridor.   Cardiovascular: Normal rate, regular rhythm. Grossly normal heart sounds.  Good peripheral circulation. Respiratory: Normal respiratory effort.  No retractions. Lungs CTAB. Musculoskeletal: Examination of the right wrist there is no gross deformity and range of motion is slow and guarded.  There is generalized tenderness on palpation of the radial aspect of the right wrist.  Patient is able to move all digits without any difficulty and motor sensory function intact.  Skin is intact.  No ecchymosis or abrasions were seen.  Capillary refill is less than 3 seconds. Neurologic:  Normal speech and language. No gross focal neurologic deficits are appreciated. No gait instability. Skin:  Skin is warm, dry and intact. No rash noted. Psychiatric: Mood and affect are normal. Speech and behavior are normal.  ____________________________________________   LABS (all labs ordered are listed, but only abnormal results are displayed)  Labs Reviewed - No data to display  RADIOLOGY  ED MD interpretation: X-rays from 01/12/2019 were reviewed.  ____________________________________________   PROCEDURES   Procedure(s) performed (including Critical Care):  Procedures   ____________________________________________   INITIAL IMPRESSION / ASSESSMENT AND PLAN / ED COURSE  As part of my medical decision making, I reviewed the following data within the electronic MEDICAL RECORD NUMBER Notes from prior ED visits and [place] Controlled Substance Database   33 year old female presents to the ED with complaint of right wrist pain after she was seen 2 days ago when an item from the shelf was dropped on her wrist.  X-rays at that time were negative.  Patient states that she is continued to have pain in the area and was given Tylenol without any relief.  X-rays were reviewed from 01/12/2019.  Patient was reassured that there was no fracture seen.  She was placed in a cock-up wrist splint.  She was discharged with prescription for naproxen 500 mg twice daily with food and tramadol 1 every 6 hours as needed for moderate pain.  She is to use ice and elevation.  She is to follow-up with the orthopedist if any continued problems after 4 to 5 days. ____________________________________________   FINAL CLINICAL IMPRESSION(S) / ED DIAGNOSES  Final diagnoses:  Contusion of right wrist, subsequent encounter     ED Discharge Orders         Ordered    naproxen (NAPROSYN) 500 MG tablet  2 times daily with meals     01/14/19 1031    traMADol (ULTRAM) 50 MG tablet  Every 6 hours PRN     01/14/19 1031           Note:  This document was prepared using Dragon voice recognition software and may include unintentional dictation errors.    Johnn Hai, PA-C 01/14/19 1709    Schuyler Amor, MD 01/15/19 [phone]

## 2019-01-29 ENCOUNTER — Emergency Department (HOSPITAL_COMMUNITY): Payer: Self-pay

## 2019-01-29 ENCOUNTER — Emergency Department (HOSPITAL_COMMUNITY)
Admission: EM | Admit: 2019-01-29 | Discharge: 2019-01-30 | Disposition: A | Discharge status: Home / Self Care | Payer: Self-pay | Attending: Emergency Medicine | Admitting: Emergency Medicine

## 2019-01-29 DIAGNOSIS — I10 Essential (primary) hypertension: Secondary | ICD-10-CM | POA: Insufficient documentation

## 2019-01-29 DIAGNOSIS — R112 Nausea with vomiting, unspecified: Secondary | ICD-10-CM | POA: Insufficient documentation

## 2019-01-29 DIAGNOSIS — F17210 Nicotine dependence, cigarettes, uncomplicated: Secondary | ICD-10-CM | POA: Insufficient documentation

## 2019-01-29 DIAGNOSIS — Z79899 Other long term (current) drug therapy: Secondary | ICD-10-CM | POA: Insufficient documentation

## 2019-01-29 LAB — LIPASE, BLOOD: Lipase: 26 U/L (ref 11–51)

## 2019-01-29 LAB — CBC WITH DIFFERENTIAL/PLATELET
Abs Immature Granulocytes: 0.09 10*3/uL — ABNORMAL HIGH (ref 0.00–0.07)
Basophils Absolute: 0 10*3/uL (ref 0.0–0.1)
Basophils Relative: 0 %
Eosinophils Absolute: 0 10*3/uL (ref 0.0–0.5)
Eosinophils Relative: 0 %
HCT: 47.4 % — ABNORMAL HIGH (ref 36.0–46.0)
Hemoglobin: 15.3 g/dL — ABNORMAL HIGH (ref 12.0–15.0)
Immature Granulocytes: 1 %
Lymphocytes Relative: 7 %
Lymphs Abs: 1.3 10*3/uL (ref 0.7–4.0)
MCH: 29.2 pg (ref 26.0–34.0)
MCHC: 32.3 g/dL (ref 30.0–36.0)
MCV: 90.5 fL (ref 80.0–100.0)
Monocytes Absolute: 0.3 10*3/uL (ref 0.1–1.0)
Monocytes Relative: 2 %
Neutro Abs: 15.8 10*3/uL — ABNORMAL HIGH (ref 1.7–7.7)
Neutrophils Relative %: 90 %
Platelets: 267 10*3/uL (ref 150–400)
RBC: 5.24 MIL/uL — ABNORMAL HIGH (ref 3.87–5.11)
RDW: 13 % (ref 11.5–15.5)
WBC: 17.6 10*3/uL — ABNORMAL HIGH (ref 4.0–10.5)
nRBC: 0 % (ref 0.0–0.2)

## 2019-01-29 LAB — COMPREHENSIVE METABOLIC PANEL
ALT: 23 U/L (ref 0–44)
AST: 23 U/L (ref 15–41)
Albumin: 4.9 g/dL (ref 3.5–5.0)
Alkaline Phosphatase: 106 U/L (ref 38–126)
Anion gap: 12 (ref 5–15)
BUN: 8 mg/dL (ref 6–20)
CO2: 23 mmol/L (ref 22–32)
Calcium: 8.8 mg/dL — ABNORMAL LOW (ref 8.9–10.3)
Chloride: 105 mmol/L (ref 98–111)
Creatinine, Ser: 0.64 mg/dL (ref 0.44–1.00)
GFR calc Af Amer: >60 mL/min (ref >60)
GFR calc non Af Amer: >60 mL/min (ref >60)
Glucose, Bld: 119 mg/dL — ABNORMAL HIGH (ref 70–99)
Potassium: 3.5 mmol/L (ref 3.5–5.1)
Sodium: 140 mmol/L (ref 135–145)
Total Bilirubin: 0.8 mg/dL (ref 0.3–1.2)
Total Protein: 8.9 g/dL — ABNORMAL HIGH (ref 6.5–8.1)

## 2019-01-29 LAB — I-STAT CREATININE, ED: Creatinine, Ser: 0.6 mg/dL (ref 0.44–1.00)

## 2019-01-29 LAB — HCG, QUANTITATIVE, PREGNANCY: hCG, Beta Chain, Quant, S: 1 m[IU]/mL (ref <5)

## 2019-01-29 MED ORDER — ALUM & MAG HYDROXIDE-SIMETH 200-200-20 MG/5ML PO SUSP
30.0000 mL | Freq: Once | ORAL | Status: AC
  Administered 2019-01-29: 30 mL via ORAL
  Filled 2019-01-29: qty 30

## 2019-01-29 MED ORDER — LIDOCAINE VISCOUS HCL 2 % MT SOLN
15.0000 mL | Freq: Once | OROMUCOSAL | Status: AC
  Administered 2019-01-29: 15 mL via ORAL
  Filled 2019-01-29: qty 15

## 2019-01-29 MED ORDER — ONDANSETRON HCL 4 MG/2ML IJ SOLN
4.0000 mg | Freq: Once | INTRAMUSCULAR | Status: AC
  Administered 2019-01-29: 4 mg via INTRAVENOUS
  Filled 2019-01-29: qty 2

## 2019-01-29 MED ORDER — PROMETHAZINE HCL 25 MG RE SUPP: 25.0000 mg | Freq: Four times a day (QID) | RECTAL | 0 refills | Status: DC | PRN

## 2019-01-29 MED ORDER — ONDANSETRON 4 MG PO TBDP: ORAL_TABLET | ORAL | 0 refills | Status: DC

## 2019-01-29 MED ORDER — SODIUM CHLORIDE 0.9 % IV BOLUS
1000.0000 mL | Freq: Once | INTRAVENOUS | Status: AC
  Administered 2019-01-29: 1000 mL via INTRAVENOUS

## 2019-01-29 MED ORDER — IOHEXOL 300 MG/ML  SOLN
100.0000 mL | Freq: Once | INTRAMUSCULAR | Status: AC | PRN
  Administered 2019-01-29: 100 mL via INTRAVENOUS

## 2019-01-29 MED ORDER — SODIUM CHLORIDE (PF) 0.9 % IJ SOLN
INTRAMUSCULAR | Status: AC
  Filled 2019-01-29: qty 50

## 2019-01-29 MED ORDER — MORPHINE SULFATE (PF) 4 MG/ML IV SOLN
4.0000 mg | Freq: Once | INTRAVENOUS | Status: AC
  Administered 2019-01-29: 4 mg via INTRAVENOUS
  Filled 2019-01-29: qty 1

## 2019-01-29 NOTE — ED Notes (Signed)
Pt requesting nausea medication. MD made aware

## 2019-01-29 NOTE — ED Provider Notes (Signed)
[employer] DEPT Provider Note   CSN: [id number] Arrival date & time: 01/29/19  1814    History   Chief Complaint Chief Complaint  Patient presents with   Abdominal Pain   Nausea    HPI Jasmine Mason is a 33 y.o. female.     34 yo F with a chief complaint of nausea and vomiting and right-sided abdominal pain.  Going on since about 13 hours ago.  She is unsure if the pain started before or after the vomiting.  Denies fevers denies vaginal complaint denies dysuria history or hesitancy.  Denies   Abdominal Pain Associated symptoms: nausea and vomiting   Associated symptoms: no chest pain, no chills, no dysuria, no fever and no shortness of breath     Past Medical History:  Diagnosis Date   Back pain    Back pain    GERD (gastroesophageal reflux disease)    Hypertension    Mesenteric adenitis    Obesity    PCOS (polycystic ovarian syndrome)    Tobacco abuse     Patient Active Problem List   Diagnosis Date Noted   Delayed gastric emptying 08/19/2017   Encounter for smoking cessation counseling 04/18/2017   Essential hypertension 04/18/2017   Epigastric abdominal pain    Acute gastritis 09/20/2016   Nausea with vomiting    Hiatal hernia    Uncontrollable vomiting 03/10/2016   Abdominal pain, epigastric    GERD (gastroesophageal reflux disease) 01/24/2015   Hypokalemia 01/24/2015   Abdominal pain 01/24/2015   Leukocytosis 01/24/2015   Chest pain 01/24/2015   Cough 01/24/2015   Pain of left calf 01/24/2015   Elevated troponin 01/24/2015   Obesity    Mesenteric adenitis    Tobacco abuse     Past Surgical History:  Procedure Laterality Date   CARPAL TUNNEL RELEASE Left ~ 2013   COLONOSCOPY WITH PROPOFOL N/A 08/08/2017   Procedure: COLONOSCOPY WITH PROPOFOL;  Surgeon: Jonathon Bellows, MD;  Location: [facility] ENDOSCOPY;  Service: Gastroenterology;  Laterality: N/A;   ESOPHAGOGASTRODUODENOSCOPY N/A 01/25/2015    Procedure: ESOPHAGOGASTRODUODENOSCOPY (EGD);  Surgeon: Irene Shipper, MD;  Location: [facility] ENDOSCOPY;  Service: Endoscopy;  Laterality: N/A;   ESOPHAGOGASTRODUODENOSCOPY (EGD) WITH PROPOFOL N/A 03/13/2016   Procedure: ESOPHAGOGASTRODUODENOSCOPY (EGD) WITH PROPOFOL;  Surgeon: Lucilla Lame, MD;  Location: [facility] ENDOSCOPY;  Service: Endoscopy;  Laterality: N/A;   ESOPHAGOGASTRODUODENOSCOPY (EGD) WITH PROPOFOL N/A 07/05/2017   Procedure: ESOPHAGOGASTRODUODENOSCOPY (EGD) WITH PROPOFOL;  Surgeon: Jonathon Bellows, MD;  Location: [facility] ENDOSCOPY;  Service: Gastroenterology;  Laterality: N/A;   FRACTURE SURGERY     ORIF ANKLE FRACTURE Left 1990's     OB History    Gravida  0   Para  0   Term  0   Preterm  0   AB  0   Living  0     SAB  0   TAB  0   Ectopic  0   Multiple  0   Live Births  0            Home Medications    Prior to Admission medications   Medication Sig Start Date End Date Taking? Authorizing Provider  alum & mag hydroxide-simeth (MAALOX MAX) 400-400-40 MG/5ML suspension Take 5 mLs by mouth every 6 (six) hours as needed for indigestion.   Yes [provider]  dicyclomine (BENTYL) 10 MG capsule Take 1 capsule (10 mg total) by mouth 4 (four) times daily -  before meals and at bedtime. 08/05/17  Yes  Jonathon Bellows, MD  docusate sodium (COLACE) 100 MG capsule Take 1 capsule (100 mg total) by mouth 2 (two) times daily. 06/27/17  Yes Mikey College, NP  fluticasone (FLONASE) 50 MCG/ACT nasal spray Place 2 sprays into both nostrils daily. 06/27/17  Yes Mikey College, NP  hydrochlorothiazide (HYDRODIURIL) 12.5 MG tablet TAKE 1 TABLET BY MOUTH ONCE DAILY Patient taking differently: Take 12.5 mg by mouth daily.  09/10/17  Yes Mikey College, NP  HYDROcodone-acetaminophen (NORCO/VICODIN) 5-325 MG tablet Take 1-2 tablets by mouth every 6 (six) hours as needed for moderate pain.  09/27/14  Yes [provider]  metoCLOPramide (REGLAN) 10 MG tablet  Take 1 tablet (10 mg total) by mouth every 8 (eight) hours as needed for nausea or vomiting. 03/21/18 03/21/19 Yes Schaevitz, Randall An, MD  naproxen (NAPROSYN) 500 MG tablet Take 1 tablet (500 mg total) by mouth 2 (two) times daily with a meal. 01/14/19  Yes Letitia Neri L, PA-C  pantoprazole (PROTONIX) 20 MG tablet Take 1 tablet (20 mg total) by mouth daily. 10/13/18 10/13/19 Yes Delman Kitten, MD  traMADol (ULTRAM) 50 MG tablet Take 1 tablet (50 mg total) by mouth every 6 (six) hours as needed for moderate pain. 01/14/19  Yes Letitia Neri L, PA-C  nicotine (NICODERM CQ - DOSED IN MG/24 HR) 7 mg/24hr patch Place 1 patch (7 mg total) onto the skin daily. Patient not taking: Reported on 01/29/2019 04/12/17   Mikey College, NP  norethindrone-ethinyl estradiol-iron (LOESTRIN FE 1.5/30) 1.5-30 MG-MCG tablet Take 1 tablet by mouth at bedtime for 28 days. Patient not taking: Reported on 01/29/2019 06/27/17 07/25/17  Mikey College, NP  ondansetron [facility] ODT) 4 MG disintegrating tablet 4mg  ODT q4 hours prn nausea/vomit 01/29/19   Deno Etienne, DO  promethazine (PHENERGAN) 25 MG suppository Place 1 suppository (25 mg total) rectally every 6 (six) hours as needed for nausea or vomiting. 01/29/19   Deno Etienne, DO  famotidine (PEPCID) 20 MG tablet Take 1 tablet (20 mg total) by mouth daily. 09/26/18 01/14/19  Cuthriell, Charline Bills, PA-C    Family History Family History  Problem Relation Age of Onset   Hypertension Mother    Hypertension Father    Diabetes Father    Heart disease Father    Hypertension Sister    Throat cancer Maternal Grandfather    Stroke Paternal Grandmother    Diabetes Paternal Grandmother    Ovarian cancer Neg Hx    Colon cancer Neg Hx    Breast cancer Neg Hx     Social History Social History   Tobacco Use   Smoking status: Current Every Day Smoker    Packs/day: 0.25    Years: 10.00    Pack years: 2.50    Types: Cigarettes   Smokeless tobacco: Never Used   Substance Use Topics   Alcohol use: No    Alcohol/week: 0.0 standard drinks   Drug use: No     Allergies   Onion   Review of Systems Review of Systems  Constitutional: Negative for chills and fever.  HENT: Negative for congestion and rhinorrhea.   Eyes: Negative for redness and visual disturbance.  Respiratory: Negative for shortness of breath and wheezing.   Cardiovascular: Negative for chest pain and palpitations.  Gastrointestinal: Positive for abdominal pain, nausea and vomiting.  Genitourinary: Negative for dysuria and urgency.  Musculoskeletal: Negative for arthralgias and myalgias.  Skin: Negative for pallor and wound.  Neurological: Negative for dizziness and headaches.  Physical Exam Updated Vital Signs BP (!) 169/92    Pulse (!) 56    Temp 98.5 F (36.9 C) (Oral)    Resp 20    Ht 5\' 7"  (1.702 m)    Wt 99.8 kg    LMP 01/18/2019    SpO2 97%    BMI 34.46 kg/m   Physical Exam Vitals signs and nursing note reviewed.  Constitutional:      General: She is not in acute distress.    Appearance: She is well-developed. She is not diaphoretic.  HENT:     Head: Normocephalic and atraumatic.  Eyes:     Pupils: Pupils are equal, round, and reactive to light.  Neck:     Musculoskeletal: Normal range of motion and neck supple.  Cardiovascular:     Rate and Rhythm: Normal rate and regular rhythm.     Heart sounds: No murmur. No friction rub. No gallop.   Pulmonary:     Effort: Pulmonary effort is normal.     Breath sounds: No wheezing or rales.  Abdominal:     General: There is no distension.     Palpations: Abdomen is soft.     Tenderness: There is abdominal tenderness (mild R lateral abdominal pain, worst to the RLQ).  Musculoskeletal:        General: No tenderness.  Skin:    General: Skin is warm and dry.  Neurological:     Mental Status: She is alert and oriented to person, place, and time.  Psychiatric:        Behavior: Behavior normal.      ED  Treatments / Results  Labs (all labs ordered are listed, but only abnormal results are displayed) Labs Reviewed  CBC WITH DIFFERENTIAL/PLATELET - Abnormal; Notable for the following components:      Result Value   WBC 17.6 (*)    RBC 5.24 (*)    Hemoglobin 15.3 (*)    HCT 47.4 (*)    Neutro Abs 15.8 (*)    Abs Immature Granulocytes 0.09 (*)    All other components within normal limits  COMPREHENSIVE METABOLIC PANEL - Abnormal; Notable for the following components:   Glucose, Bld 119 (*)    Calcium 8.8 (*)    Total Protein 8.9 (*)    All other components within normal limits  HCG, QUANTITATIVE, PREGNANCY  LIPASE, BLOOD  I-STAT CREATININE, ED    EKG None  Radiology Ct Abdomen Pelvis W Contrast  Result Date: 01/29/2019 CLINICAL DATA:  Nausea and vomiting. Appendicitis suspected. EXAM: CT ABDOMEN AND PELVIS WITH CONTRAST TECHNIQUE: Multidetector CT imaging of the abdomen and pelvis was performed using the standard protocol following bolus administration of intravenous contrast. CONTRAST:  181mL OMNIPAQUE IOHEXOL 300 MG/ML  SOLN COMPARISON:  Multiple prior CT, most recently 10/13/2018 FINDINGS: Lower chest: Lung bases are clear. Hepatobiliary: Enlarged liver spanning 22 cm cranial caudal. No focal hepatic lesion. Gallbladder physiologically distended, no calcified stone. No biliary dilatation. Pancreas: No ductal dilatation or inflammation. Spleen: Normal in size without focal abnormality. Adrenals/Urinary Tract: Normal adrenal glands. No hydronephrosis or perinephric edema. Homogeneous renal enhancement. Urinary bladder is physiologically distended without wall thickening. Stomach/Bowel: Stomach is within normal limits. Appendix appears normal, for example image 78 series 4. No evidence of bowel wall thickening, distention, or inflammatory changes. Vascular/Lymphatic: Trace atherosclerosis of the distal aorta and iliac vessels. No aortic aneurysm. Portal vein is patent. Reproductive: Uterus  is unremarkable. Symmetric enlargement of both ovaries. No suspicious adnexal mass. Other: Small  fat containing umbilical hernia. No free air, free fluid, or intra-abdominal fluid collection. Musculoskeletal: There are no acute or suspicious osseous abnormalities. Facet hypertrophy at the lumbosacral junction. IMPRESSION: 1. No acute findings in the abdomen/pelvis. Normal appendix. 2. Hepatomegaly. 3. Small fat containing umbilical hernia. 4. Symmetrically enlarged ovaries, supporting history of polycystic ovarian syndrome (listed in the electronic medical record). 5. Minor, but age advanced aortic Atherosclerosis (ICD10-I70.0). Electronically Signed   By: Keith Rake M.D.   On: 01/29/2019 23:12    Procedures Procedures (including critical care time) Procedure note: Ultrasound Guided Peripheral IV Ultrasound guided peripheral 1.88 inch angiocath IV placement performed by me. Indications: Nursing unable to place IV. Details: The antecubital fossa and upper arm were evaluated with a multifrequency linear probe. Patent brachial veins were noted. 1 attempt was made to cannulate a vein under realtime US guidance with successful cannulation of the vein and catheter placement. There is return of non-pulsatile dark red blood. The patient tolerated the procedure well without complications. Images archived electronically.  CPT codes: [phone] and [phone]  Medications Ordered in ED Medications  sodium chloride (PF) 0.9 % injection (has no administration in time range)  sodium chloride 0.9 % bolus 1,000 mL (0 mLs Intravenous Stopped 01/29/19 2235)  ondansetron (ZOFRAN) injection 4 mg (4 mg Intravenous Given 01/29/19 2015)  morphine 4 MG/ML injection 4 mg (4 mg Intravenous Given 01/29/19 2143)  ondansetron (ZOFRAN) injection 4 mg (4 mg Intravenous Given 01/29/19 2302)  alum & mag hydroxide-simeth (MAALOX/MYLANTA) 200-200-20 MG/5ML suspension 30 mL (30 mLs Oral Given 01/29/19 2304)    And  lidocaine (XYLOCAINE) 2 %  viscous mouth solution 15 mL (15 mLs Oral Given 01/29/19 2304)  iohexol (OMNIPAQUE) 300 MG/ML solution 100 mL (100 mLs Intravenous Contrast Given 01/29/19 2233)     Initial Impression / Assessment and Plan / ED Course  I have reviewed the triage vital signs and the nursing notes.  Pertinent labs & imaging results that were available during my care of the patient were reviewed by me and considered in my medical decision making (see chart for details).        33 yo F with a chief complaints of right-sided abdominal pain nausea and vomiting.  Patient is very sleepy on arrival.  I am unsure of the etiology of this.  She told the nurse at some point that she takes narcotics at home.  I wonder if she just before arrival.  There is some difficulty obtaining blood work.  IV placed by myself.  Will obtain CT scan.  CBC with mild leukocytosis.  Patient is able to tolerate by mouth.  CT scan negative for appendicitis or other acute abdominal pathology.  PCP follow-up.  11:47 PM:  I have discussed the diagnosis/risks/treatment options with the patient and family and believe the pt to be eligible for discharge home to follow-up with PCP. We also discussed returning to the ED immediately if new or worsening sx occur. We discussed the sx which are most concerning (e.g., sudden worsening pain, fever, inability to tolerate by mouth) that necessitate immediate return. Medications administered to the patient during their visit and any new prescriptions provided to the patient are listed below.  Medications given during this visit Medications  sodium chloride (PF) 0.9 % injection (has no administration in time range)  sodium chloride 0.9 % bolus 1,000 mL (0 mLs Intravenous Stopped 01/29/19 2235)  ondansetron (ZOFRAN) injection 4 mg (4 mg Intravenous Given 01/29/19 2015)  morphine 4 MG/ML injection 4 mg (  4 mg Intravenous Given 01/29/19 2143)  ondansetron (ZOFRAN) injection 4 mg (4 mg Intravenous Given 01/29/19 2302)  alum  & mag hydroxide-simeth (MAALOX/MYLANTA) 200-200-20 MG/5ML suspension 30 mL (30 mLs Oral Given 01/29/19 2304)    And  lidocaine (XYLOCAINE) 2 % viscous mouth solution 15 mL (15 mLs Oral Given 01/29/19 2304)  iohexol (OMNIPAQUE) 300 MG/ML solution 100 mL (100 mLs Intravenous Contrast Given 01/29/19 2233)     The patient appears reasonably screen and/or stabilized for discharge and I doubt any other medical condition or other [facility] requiring further screening, evaluation, or treatment in the ED at this time prior to discharge.    Final Clinical Impressions(s) / ED Diagnoses   Final diagnoses:  Intractable vomiting with nausea, unspecified vomiting type    ED Discharge Orders         Ordered    ondansetron (ZOFRAN ODT) 4 MG disintegrating tablet     01/29/19 2329    promethazine (PHENERGAN) 25 MG suppository  Every 6 hours PRN     01/29/19 2329           Deno Etienne, DO 01/29/19 2347

## 2019-01-29 NOTE — Discharge Instructions (Signed)
Your lab work and CT scan was unremarkable.  Please take either the Zofran or the Phenergan suppositories to help you with nausea.  Please follow-up with your family doctor.  Try zantac or pepcid twice a day.  Try to avoid things that may make this worse, most commonly these are spicy foods tomato based products fatty foods chocolate and peppermint.  Alcohol and tobacco can also make this worse.  Return to the emergency department for sudden worsening pain fever or inability to eat or drink.

## 2019-01-29 NOTE — ED Notes (Signed)
Patient transported to CT 

## 2019-01-29 NOTE — ED Notes (Addendum)
Pt upset that she is not getting pain medication. Stated "What do you mean you aren't giving me pain medication?" Pt informed that the doctor did not order any, and this RN cannot give any without an order. Pt stated "this is ridiculous, I am on home medication for pain. Why wont he give me any? Also I have asked for a pillow at least 5 times now. Why haven't I gotten that either?" Pt informed that RN will look for a pillow if there is one available

## 2019-01-29 NOTE — ED Notes (Signed)
EDP at bedside  

## 2019-01-29 NOTE — ED Notes (Addendum)
Pt stated "I have been throwing up all day. I need that cocktail thing they gave me last time I was here." Pt also requesting the doctor call her "Stomach doctor and let them know I am here"

## 2019-01-29 NOTE — ED Notes (Signed)
MD at bedside attempting to recollect blood

## 2019-01-29 NOTE — ED Triage Notes (Signed)
Pt to ED via EMS pt from [facility] the [place]. Pt c/o nausea and vomiting since 0900. Pt has hx of GERD. Generalized weakness. #22 R.Hand. 50 Fentanyl 4 Zofran given by EMS. Hx HTN

## 2019-01-30 ENCOUNTER — Encounter: Payer: Self-pay | Admitting: Family Medicine

## 2019-01-30 ENCOUNTER — Telehealth: Payer: Self-pay | Admitting: Nurse Practitioner

## 2019-01-30 NOTE — Telephone Encounter (Signed)
Work note written, sent on [employer]. Return to work on or after Monday 02/02/19, no restrictions.  Should cancel the apt on Monday, not required, if she arrives she can pick up her work note if she needs copy of it.  Nobie Putnam, [facility] [employer] 01/30/2019, 5:08 PM

## 2019-01-30 NOTE — Telephone Encounter (Signed)
She was treated in the ED for wrist recently. It looks like they wrote a note for her today, that says return to work on 01/31/19.  Nobie Putnam, [facility] [employer] [facility] 01/30/2019, 11:59 AM

## 2019-01-30 NOTE — ED Notes (Addendum)
While discharging patient, patient had multiple complaints. Patient was not pleased with a diagnosis, she states "what kind of diagnosis is nausea", then she wanted to speak with a social worker because she needed a ride back to the [place], I was able to get her transportation with [employer] back to the [place], she needed a work a note, provided a work, and she wanted to see the physician, reviewed discharge instructions due to Dr. Ellin Mayhew already leaving for the night. Patient was degrading the skills of the nurses and physicians. Furthermore, she didn't think a CT scan was helpful and it would not show anything. Patient was escorted out of facility with a steady gait.

## 2019-01-30 NOTE — Telephone Encounter (Signed)
Pt needs letter to return to work after injury on wrist? Is aware of her pcp being on medical leave but is asking if dr. Raliegh Ip could give her that letter?

## 2019-01-30 NOTE — Telephone Encounter (Signed)
The pt state she was seen in the ER yesterday for vomiting, nausea and abdominal pains. She is requesting a note stating she is clear to return to work with no restriction due to her recent wrist injury. She said she was seen in the ER on 6/22 and instructed to f/u with her PCP.  The pt was scheduled for a face to face Monday, 7/13 to f/u on wrist injury.

## 2019-02-02 ENCOUNTER — Ambulatory Visit: Payer: Self-pay | Admitting: Family Medicine

## 2019-02-18 ENCOUNTER — Emergency Department
Admission: EM | Admit: 2019-02-18 | Discharge: 2019-02-19 | Disposition: A | Discharge status: Home / Self Care | Payer: Self-pay | Attending: Emergency Medicine | Admitting: Emergency Medicine

## 2019-02-18 ENCOUNTER — Encounter: Payer: Self-pay | Admitting: *Deleted

## 2019-02-18 ENCOUNTER — Other Ambulatory Visit: Payer: Self-pay

## 2019-02-18 DIAGNOSIS — Z20828 Contact with and (suspected) exposure to other viral communicable diseases: Secondary | ICD-10-CM | POA: Insufficient documentation

## 2019-02-18 DIAGNOSIS — R112 Nausea with vomiting, unspecified: Secondary | ICD-10-CM | POA: Insufficient documentation

## 2019-02-18 DIAGNOSIS — R531 Weakness: Secondary | ICD-10-CM

## 2019-02-18 DIAGNOSIS — E860 Dehydration: Secondary | ICD-10-CM | POA: Insufficient documentation

## 2019-02-18 DIAGNOSIS — I10 Essential (primary) hypertension: Secondary | ICD-10-CM | POA: Insufficient documentation

## 2019-02-18 DIAGNOSIS — M6281 Muscle weakness (generalized): Secondary | ICD-10-CM | POA: Insufficient documentation

## 2019-02-18 DIAGNOSIS — N390 Urinary tract infection, site not specified: Secondary | ICD-10-CM | POA: Insufficient documentation

## 2019-02-18 DIAGNOSIS — F17210 Nicotine dependence, cigarettes, uncomplicated: Secondary | ICD-10-CM | POA: Insufficient documentation

## 2019-02-18 LAB — COMPREHENSIVE METABOLIC PANEL
ALT: 35 U/L (ref 0–44)
AST: 29 U/L (ref 15–41)
Albumin: 4.6 g/dL (ref 3.5–5.0)
Alkaline Phosphatase: 90 U/L (ref 38–126)
Anion gap: 12 (ref 5–15)
BUN: 14 mg/dL (ref 6–20)
CO2: 24 mmol/L (ref 22–32)
Calcium: 9.2 mg/dL (ref 8.9–10.3)
Chloride: 103 mmol/L (ref 98–111)
Creatinine, Ser: 0.8 mg/dL (ref 0.44–1.00)
GFR calc Af Amer: >60 mL/min (ref >60)
GFR calc non Af Amer: >60 mL/min (ref >60)
Glucose, Bld: 114 mg/dL — ABNORMAL HIGH (ref 70–99)
Potassium: 3.4 mmol/L — ABNORMAL LOW (ref 3.5–5.1)
Sodium: 139 mmol/L (ref 135–145)
Total Bilirubin: 0.8 mg/dL (ref 0.3–1.2)
Total Protein: 8.2 g/dL — ABNORMAL HIGH (ref 6.5–8.1)

## 2019-02-18 LAB — TROPONIN I (HIGH SENSITIVITY): Troponin I (High Sensitivity): 4 ng/L (ref <18)

## 2019-02-18 LAB — URINALYSIS, COMPLETE (UACMP) WITH MICROSCOPIC
Bacteria, UA: NONE SEEN
Bilirubin Urine: NEGATIVE
Glucose, UA: NEGATIVE mg/dL
Hgb urine dipstick: NEGATIVE
Ketones, ur: NEGATIVE mg/dL
Nitrite: NEGATIVE
Protein, ur: 30 mg/dL — AB
Specific Gravity, Urine: 1.034 — ABNORMAL HIGH (ref 1.005–1.030)
Squamous Epithelial / HPF: >50 — ABNORMAL HIGH (ref 0–5)
pH: 5 (ref 5.0–8.0)

## 2019-02-18 LAB — CBC
HCT: 46.4 % — ABNORMAL HIGH (ref 36.0–46.0)
Hemoglobin: 15.5 g/dL — ABNORMAL HIGH (ref 12.0–15.0)
MCH: 29.5 pg (ref 26.0–34.0)
MCHC: 33.4 g/dL (ref 30.0–36.0)
MCV: 88.2 fL (ref 80.0–100.0)
Platelets: 366 10*3/uL (ref 150–400)
RBC: 5.26 MIL/uL — ABNORMAL HIGH (ref 3.87–5.11)
RDW: 12.9 % (ref 11.5–15.5)
WBC: 12.6 10*3/uL — ABNORMAL HIGH (ref 4.0–10.5)
nRBC: 0 % (ref 0.0–0.2)

## 2019-02-18 LAB — LIPASE, BLOOD: Lipase: 28 U/L (ref 11–51)

## 2019-02-18 LAB — POCT PREGNANCY, URINE: Preg Test, Ur: NEGATIVE

## 2019-02-18 MED ORDER — METOCLOPRAMIDE HCL 5 MG/ML IJ SOLN
5.0000 mg | Freq: Once | INTRAMUSCULAR | Status: AC
  Administered 2019-02-19: 5 mg via INTRAVENOUS
  Filled 2019-02-18: qty 2

## 2019-02-18 MED ORDER — FAMOTIDINE IN NACL 20-0.9 MG/50ML-% IV SOLN
20.0000 mg | Freq: Once | INTRAVENOUS | Status: AC
  Administered 2019-02-19: 20 mg via INTRAVENOUS
  Filled 2019-02-18: qty 50

## 2019-02-18 MED ORDER — SODIUM CHLORIDE 0.9 % IV BOLUS
1000.0000 mL | Freq: Once | INTRAVENOUS | Status: AC
  Administered 2019-02-19: 1000 mL via INTRAVENOUS

## 2019-02-18 NOTE — ED Triage Notes (Signed)
Pt to ED reporting abd pain and GERD for the over a week. Pt has been treated through Gastroenterologist for GERD but pt has had no relief with multiple different medications including Protonix and Maalox. Pt now feeling weak and a pressure in her chest with dizziness and lightheadedness when she has a BM.

## 2019-02-18 NOTE — ED Notes (Signed)
ED Provider at bedside. 

## 2019-02-18 NOTE — ED Notes (Addendum)
Pt returned to lobby; to STAT desk to inquire over wait time again; pt again updated on such; pt remains upset "yall always playing games up here, especially you! You the main one"; explained to pt that she will be evaluated as soon as a room is available; pt is upset stating people are going back before her; explained to pt that patients are taken back based on acuity and area to be seen but that no one has gone before her

## 2019-02-18 NOTE — ED Notes (Addendum)
Pt ambulatory to STAT desk to inquire over wait; no difficulty or distress noted; pt updated on wait time; pt st "this is just ridiculous, it's like this everytime I come in here, especially when certain people are in working, they make me wait!"; pt assured that she will be seen and evaluated when an exam room is available; pt noted walking out of ED lobby

## 2019-02-18 NOTE — ED Notes (Signed)
Pt has not returned to lobby 

## 2019-02-18 NOTE — ED Provider Notes (Signed)
[facility] Emergency Department Provider Note   ____________________________________________   First MD Initiated Contact with Patient 02/18/19 2319     (approximate)  I have reviewed the triage vital signs and the nursing notes.   HISTORY  Chief Complaint Weakness and Nausea    HPI Jasmine Mason is a 32 y.o. female who presents to the ED from home with a chief complaint of epigastric pain, nausea and vomiting.  Patient has a history of gastroparesis.  Per patient, her PCP took her off Reglan several weeks ago and she does not know why.  Unable to get in touch with her PCP because she is on maternity leave.  Complains of epigastric burning, nausea and vomiting since being taken off of Reglan.  Still continue with her Protonix and Maalox but denies relief of symptoms.  Had a visit to [place] long 2 weeks ago for same with negative CT scan.  Now feels generally weak, dizzy and lightheaded.  Denies fever, cough, chest pain, shortness of breath, diarrhea.  Denies recent travel, trauma.  COVID exposure through working at [employer].       Past Medical History:  Diagnosis Date  . Back pain   . Back pain   . GERD (gastroesophageal reflux disease)   . Hypertension   . Mesenteric adenitis   . Obesity   . PCOS (polycystic ovarian syndrome)   . Tobacco abuse     Patient Active Problem List   Diagnosis Date Noted  . 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 Encounter for smoking cessation counseling 04/18/2017  . Essential hypertension 04/18/2017  . Epigastric abdominal pain   . Acute gastritis 09/20/2016  . Nausea with vomiting   . Hiatal hernia   . Uncontrollable vomiting 03/10/2016  . Abdominal pain, epigastric   . GERD (gastroesophageal reflux disease) 01/24/2015  . Hypokalemia 01/24/2015  . Abdominal pain 01/24/2015  . Leukocytosis 01/24/2015  . Chest pain 01/24/2015  . Cough 01/24/2015  . Pain of left calf 01/24/2015  . Elevated troponin  01/24/2015  . Obesity   . Mesenteric adenitis   . Tobacco abuse     Past Surgical History:  Procedure Laterality Date  . CARPAL TUNNEL RELEASE Left ~ 2013  . COLONOSCOPY WITH PROPOFOL N/A 08/08/2017   Procedure: COLONOSCOPY WITH PROPOFOL;  Surgeon: Jonathon Bellows, MD;  Location: [facility] ENDOSCOPY;  Service: Gastroenterology;  Laterality: N/A;  . ESOPHAGOGASTRODUODENOSCOPY N/A 01/25/2015   Procedure: ESOPHAGOGASTRODUODENOSCOPY (EGD);  Surgeon: Irene Shipper, MD;  Location: [facility] ENDOSCOPY;  Service: Endoscopy;  Laterality: N/A;  . ESOPHAGOGASTRODUODENOSCOPY (EGD) WITH PROPOFOL N/A 03/13/2016   Procedure: ESOPHAGOGASTRODUODENOSCOPY (EGD) WITH PROPOFOL;  Surgeon: Lucilla Lame, MD;  Location: [facility] ENDOSCOPY;  Service: Endoscopy;  Laterality: N/A;  . ESOPHAGOGASTRODUODENOSCOPY (EGD) WITH PROPOFOL N/A 07/05/2017   Procedure: ESOPHAGOGASTRODUODENOSCOPY (EGD) WITH PROPOFOL;  Surgeon: Jonathon Bellows, MD;  Location: [facility] ENDOSCOPY;  Service: Gastroenterology;  Laterality: N/A;  . FRACTURE SURGERY    . ORIF ANKLE FRACTURE Left 1990's    Prior to Admission medications   Medication Sig Start Date End Date Taking? Authorizing Provider  alum & mag hydroxide-simeth (MAALOX MAX) 400-400-40 MG/5ML suspension Take 5 mLs by mouth every 6 (six) hours as needed for indigestion.    [provider]  dicyclomine (BENTYL) 10 MG capsule Take 1 capsule (10 mg total) by mouth 4 (four) times daily -  before meals and at bedtime. 08/05/17   Jonathon Bellows, MD  docusate sodium (COLACE) 100 MG capsule Take 1 capsule (100 mg total)  by mouth 2 (two) times daily. 06/27/17   Mikey College, NP  fluticasone (FLONASE) 50 MCG/ACT nasal spray Place 2 sprays into both nostrils daily. 06/27/17   Mikey College, NP  hydrochlorothiazide (HYDRODIURIL) 12.5 MG tablet TAKE 1 TABLET BY MOUTH ONCE DAILY Patient taking differently: Take 12.5 mg by mouth daily.  09/10/17   Mikey College, NP  HYDROcodone-acetaminophen (NORCO/VICODIN)  5-325 MG tablet Take 1-2 tablets by mouth every 6 (six) hours as needed for moderate pain.  09/27/14   [provider]  metoCLOPramide (REGLAN) 10 MG tablet Take 1 tablet (10 mg total) by mouth every 8 (eight) hours as needed for nausea or vomiting. 03/21/18 03/21/19  Orbie Pyo, MD  naproxen (NAPROSYN) 500 MG tablet Take 1 tablet (500 mg total) by mouth 2 (two) times daily with a meal. 01/14/19   Johnn Hai, PA-C  nicotine (NICODERM CQ - DOSED IN MG/24 HR) 7 mg/24hr patch Place 1 patch (7 mg total) onto the skin daily. Patient not taking: Reported on 01/29/2019 04/12/17   Mikey College, NP  norethindrone-ethinyl estradiol-iron (LOESTRIN FE 1.5/30) 1.5-30 MG-MCG tablet Take 1 tablet by mouth at bedtime for 28 days. Patient not taking: Reported on 01/29/2019 06/27/17 07/25/17  Mikey College, NP  ondansetron [facility] ODT) 4 MG disintegrating tablet 4mg  ODT q4 hours prn nausea/vomit 01/29/19   Deno Etienne, DO  pantoprazole (PROTONIX) 20 MG tablet Take 1 tablet (20 mg total) by mouth daily. 10/13/18 10/13/19  Delman Kitten, MD  promethazine (PHENERGAN) 25 MG suppository Place 1 suppository (25 mg total) rectally every 6 (six) hours as needed for nausea or vomiting. 01/29/19   Deno Etienne, DO  traMADol (ULTRAM) 50 MG tablet Take 1 tablet (50 mg total) by mouth every 6 (six) hours as needed for moderate pain. 01/14/19   Johnn Hai, PA-C  famotidine (PEPCID) 20 MG tablet Take 1 tablet (20 mg total) by mouth daily. 09/26/18 01/14/19  Cuthriell, Charline Bills, PA-C    Allergies Onion  Family History  Problem Relation Age of Onset  . Hypertension Mother   . Hypertension Father   . Diabetes Father   . Heart disease Father   . Hypertension Sister   . Throat cancer Maternal Grandfather   . Stroke Paternal Grandmother   . Diabetes Paternal Grandmother   . Ovarian cancer Neg Hx   . Colon cancer Neg Hx   . Breast cancer Neg Hx     Social History Social History   Tobacco Use   . Smoking status: Current Every Day Smoker    Packs/day: 0.25    Years: 10.00    Pack years: 2.50    Types: Cigarettes  . Smokeless tobacco: Never Used  Substance Use Topics  . Alcohol use: No    Alcohol/week: 0.0 standard drinks  . Drug use: No    Review of Systems  Constitutional: No fever/chills Eyes: No visual changes. ENT: No sore throat. Cardiovascular: Denies chest pain. Respiratory: Denies shortness of breath. Gastrointestinal: Positive for epigastric abdominal pain, nausea and vomiting.  No diarrhea.  No constipation. Genitourinary: Negative for dysuria. Musculoskeletal: Negative for back pain. Skin: Negative for rash. Neurological: Negative for headaches, focal weakness or numbness.   ____________________________________________   PHYSICAL EXAM:  VITAL SIGNS: ED Triage Vitals [02/18/19 1938]  Enc Vitals Group     BP (!) 142/92     Pulse Rate (!) 115     Resp 16     Temp 99.3 F (37.4 C)  Temp Source Oral     SpO2 94 %     Weight 220 lb (99.8 kg)     Height 5\' 7"  (1.702 m)     Head Circumference      Peak Flow      Pain Score 8     Pain Loc      Pain Edu?      Excl. in [place]?     Constitutional: Alert and oriented. Well appearing and in mild acute distress. Eyes: Conjunctivae are normal. PERRL. EOMI. Head: Atraumatic. Nose: No congestion/rhinnorhea. Mouth/Throat: Mucous membranes are mildly dry.  Oropharynx non-erythematous. Neck: No stridor.   Cardiovascular: Normal rate, regular rhythm. Grossly normal heart sounds.  Good peripheral circulation. Respiratory: Normal respiratory effort.  No retractions. Lungs CTAB. Gastrointestinal: Soft and mildly tender to palpation epigastrium without rebound or guarding. No distention. No abdominal bruits. No CVA tenderness. Musculoskeletal: No lower extremity tenderness nor edema.  No joint effusions. Neurologic:  Normal speech and language. No gross focal neurologic deficits are appreciated. No gait  instability. Skin:  Skin is warm, dry and intact. No rash noted. Psychiatric: Mood and affect are normal. Speech and behavior are normal.  ____________________________________________   LABS (all labs ordered are listed, but only abnormal results are displayed)  Labs Reviewed  COMPREHENSIVE METABOLIC PANEL - Abnormal; Notable for the following components:      Result Value   Potassium 3.4 (*)    Glucose, Bld 114 (*)    Total Protein 8.2 (*)    All other components within normal limits  CBC - Abnormal; Notable for the following components:   WBC 12.6 (*)    RBC 5.26 (*)    Hemoglobin 15.5 (*)    HCT 46.4 (*)    All other components within normal limits  URINALYSIS, COMPLETE (UACMP) WITH MICROSCOPIC - Abnormal; Notable for the following components:   Color, Urine AMBER (*)    APPearance CLOUDY (*)    Specific Gravity, Urine 1.034 (*)    Protein, ur 30 (*)    Leukocytes,Ua TRACE (*)    Squamous Epithelial / LPF >50 (*)    All other components within normal limits  LIPASE, BLOOD  POCT PREGNANCY, URINE  POC URINE PREG, ED  TROPONIN I (HIGH SENSITIVITY)   ____________________________________________  EKG  ED ECG REPORT I, SUNG,JADE J, the attending physician, personally viewed and interpreted this ECG.   Date: 02/19/2019  EKG Time: 1944  Rate: 111  Rhythm: sinus tachycardia  Axis: Normal  Intervals:none  ST&T Change: Nonspecific T wave changes  ____________________________________________  RADIOLOGY  ED MD interpretation:  None  Official radiology report(s): No results found.  ____________________________________________   PROCEDURES  Procedure(s) performed (including Critical Care):  Procedures   ____________________________________________   INITIAL IMPRESSION / ASSESSMENT AND PLAN / ED COURSE  As part of my medical decision making, I reviewed the following data within the [place] notes reviewed and incorporated, Labs  reviewed, EKG interpreted, Old chart reviewed and Notes from prior ED visits     Maniya Donovan was evaluated in Emergency Department on 02/19/2019 for the symptoms described in the history of present illness. She was evaluated in the context of the global COVID-19 pandemic, which necessitated consideration that the patient might be at risk for infection with the SARS-CoV-2 virus that causes COVID-19. Institutional protocols and algorithms that pertain to the evaluation of patients at risk for COVID-19 are in a state of rapid change based on information released by regulatory  bodies including the CDC and federal and state organizations. These policies and algorithms were followed during the patient's care in the ED.   33 year old female with GERD and gastroparesis who presents with epigastric abdominal pain, nausea and vomiting.  Recently taken off her Reglan.  Patient does not know why and denies she was having side effects such as dystonia. Differential diagnosis includes, but is not limited to, biliary disease (biliary colic, acute cholecystitis, cholangitis, choledocholithiasis, etc), intrathoracic causes for epigastric abdominal pain including ACS, gastritis, duodenitis, pancreatitis, small bowel or large bowel obstruction, abdominal aortic aneurysm, hernia, and ulcer(s).  Will obtain basic lab work including LFTs and lipase.  Initiate IV fluid resuscitation, IV Reglan and Pepcid.  Will reassess.  Will obtain send out COVID swab for possible exposure.   Clinical Course as of Feb 18 214  Thu Feb 19, 2019  0214 Patient was able to tolerate fluids without emesis.  She is feeling significantly better.  Will discharge her home on prescriptions for Reglan, Carafate and Keflex.  She will follow-up with her GI doctor.  Strict return precautions given.  Patient verbalizes understanding agrees with plan of care.   [JS]    Clinical Course User Index [JS] Paulette Blanch, MD      ____________________________________________   FINAL CLINICAL IMPRESSION(S) / ED DIAGNOSES  Final diagnoses:  Generalized weakness  Dehydration  Urinary tract infection without hematuria, site unspecified  Non-intractable vomiting with nausea, unspecified vomiting type     ED Discharge Orders    None       Note:  This document was prepared using Dragon voice recognition software and may include unintentional dictation errors.   Paulette Blanch, MD 02/19/19 [phone]

## 2019-02-18 NOTE — ED Notes (Signed)
Pt to room 13; placed in hosp gown & on card monitor for further evaluation

## 2019-02-19 MED ORDER — CEPHALEXIN 500 MG PO CAPS: 500.0000 mg | ORAL_CAPSULE | Freq: Three times a day (TID) | ORAL | 0 refills | Status: DC

## 2019-02-19 MED ORDER — SODIUM CHLORIDE 0.9 % IV SOLN
1.0000 g | Freq: Once | INTRAVENOUS | Status: AC
  Administered 2019-02-19: 01:00:00 1 g via INTRAVENOUS
  Filled 2019-02-19: qty 10

## 2019-02-19 MED ORDER — METOCLOPRAMIDE HCL 10 MG PO TABS: 10.0000 mg | ORAL_TABLET | Freq: Three times a day (TID) | ORAL | 0 refills | Status: DC

## 2019-02-19 MED ORDER — SUCRALFATE 1 GM/10ML PO SUSP: 1.0000 g | Freq: Four times a day (QID) | ORAL | 1 refills | Status: DC

## 2019-02-19 NOTE — Discharge Instructions (Addendum)
Take antibiotic as prescribed (Keflex). Restart Reglan daily. Take Carafate daily. Return to the ER for worsening symptoms, persistent vomiting, difficulty breathing or other concerns.

## 2019-02-20 LAB — NOVEL CORONAVIRUS, NAA (HOSP ORDER, SEND-OUT TO REF LAB; TAT 18-24 HRS): SARS-CoV-2, NAA: NOT DETECTED

## 2019-02-25 ENCOUNTER — Ambulatory Visit
Admission: RE | Admit: 2019-02-25 | Discharge: 2019-02-25 | Disposition: A | Discharge status: Home / Self Care | Payer: Self-pay | Source: Ambulatory Visit

## 2019-02-27 ENCOUNTER — Telehealth: Payer: Self-pay | Admitting: Nurse Practitioner

## 2019-02-27 MED ORDER — GENERIC EXTERNAL MEDICATION
Status: DC
Start: ? — End: 2019-02-27

## 2019-02-27 MED ORDER — SODIUM CHLORIDE 0.9 % IV SOLN
10.00 | INTRAVENOUS | Status: DC
Start: ? — End: 2019-02-27

## 2019-02-27 NOTE — Telephone Encounter (Signed)
Pt called wanting an appt with Lauren to refill her pantoprazole.  She said she has been to the ER recently for vomiting and upset stomach.  I made her an appt for Monday but we may need to call her to see what is actually going on and if she has had a Covid test at the ER.    She said she had left a message and ask to schd an appt.  I have not seen any messages.  Nothing noted in chart that she had called or sent the office a message.  Con Memos

## 2019-03-02 ENCOUNTER — Ambulatory Visit: Payer: Self-pay | Admitting: Nurse Practitioner

## 2019-03-05 ENCOUNTER — Ambulatory Visit: Payer: Self-pay | Admitting: Gastroenterology

## 2019-03-05 ENCOUNTER — Encounter: Payer: Self-pay | Admitting: Gastroenterology

## 2019-03-05 NOTE — Progress Notes (Deleted)
Jonathon Bellows MD, MRCP(U.K) [address]  [place]  [place], [place] [address]  Main: [phone]  Fax: [phone]   Primary Care Physician: Mikey College, NP  Primary Gastroenterologist:  Dr. Jonathon Bellows   ED follow up   HPI: Jasmine Mason is a 33 y.o. female   Summary of history :  She was initially seen when referred in 06/2017 for gastritis. She was seen in the ER on 06/26/2017 with intermittent chest pain felt like a sensation of burning. Had prior episodes of vomiting. She was not on a PPI at that point of time.  She says that she has been sick , throwing up since 12/2016 . Throws up 15 times, when she eats , feels it sits in her throat , chest hurts when she eats. No issues when walking or exerting herself. Also has had abdominal pain since last week , pain is in her epigastrium , continuous, throwing up helps. Worse by laying flat . Eating makes it worse. Pain is worse right after eating . Had been using  goodie powder- upto 2-3 times a day. Trying to stop smoking. Denies any illegal drug use or marijuana use.    She takes hydrocodone on and off : has been prescribed it for abdominal pain by the ER. 07/05/17: EGD 6 cm hiatal hernia-normal gastric biopsies . Gastric emptying study ordered but not done  08/08/2017: Colonoscopy: Diminutive hyperplastic polyp resected.  08/17/2017: Gastric emptying study: Delayed gastric emptying noted.  Interval history   08/20/2017-03/05/2019   Since her last office visit over a year and a half back she has had a few visits to the ER with intractable vomiting nausea abdominal pain.  The last visit was on 02/27/2019.  01/29/2019: CT abdomen and pelvis: Hepatomegaly, small fat-containing umbilical hernia.  Symmetrical enlarged ovaries supportive history of polycystic ovarian syndrome.  02/18/2019: CBC: Hemoglobin 15.5, CMP normal except for elevated glucose 114.  Lipase normal.       Was prescribed Trulance for constipation  and it was working and last seen at the office on 08/20/2017 Gained 3 lbs. Still takjes alleve on a regular basis,     Current Outpatient Medications  Medication Sig Dispense Refill  . alum & mag hydroxide-simeth (MAALOX MAX) 400-400-40 MG/5ML suspension Take 5 mLs by mouth every 6 (six) hours as needed for indigestion.    . cephALEXin (KEFLEX) 500 MG capsule Take 1 capsule (500 mg total) by mouth 3 (three) times daily. 21 capsule 0  . dicyclomine (BENTYL) 10 MG capsule Take 1 capsule (10 mg total) by mouth 4 (four) times daily -  before meals and at bedtime. 90 capsule 0  . docusate sodium (COLACE) 100 MG capsule Take 1 capsule (100 mg total) by mouth 2 (two) times daily. 10 capsule 0  . fluticasone (FLONASE) 50 MCG/ACT nasal spray Place 2 sprays into both nostrils daily. 16 g 6  . hydrochlorothiazide (HYDRODIURIL) 12.5 MG tablet TAKE 1 TABLET BY MOUTH ONCE DAILY (Patient taking differently: Take 12.5 mg by mouth daily. ) 90 tablet 2  . HYDROcodone-acetaminophen (NORCO/VICODIN) 5-325 MG tablet Take 1-2 tablets by mouth every 6 (six) hours as needed for moderate pain.     Marland Kitchen metoCLOPramide (REGLAN) 10 MG tablet Take 1 tablet (10 mg total) by mouth 4 (four) times daily -  before meals and at bedtime. 60 tablet 0  . naproxen (NAPROSYN) 500 MG tablet Take 1 tablet (500 mg total) by mouth 2 (two) times daily with a meal. 20  tablet 0  . nicotine (NICODERM CQ - DOSED IN MG/24 HR) 7 mg/24hr patch Place 1 patch (7 mg total) onto the skin daily. (Patient not taking: Reported on 01/29/2019) 28 patch 0  . norethindrone-ethinyl estradiol-iron (LOESTRIN FE 1.5/30) 1.5-30 MG-MCG tablet Take 1 tablet by mouth at bedtime for 28 days. (Patient not taking: Reported on 01/29/2019) 1 Package 11  . ondansetron (ZOFRAN ODT) 4 MG disintegrating tablet 4mg  ODT q4 hours prn nausea/vomit 20 tablet 0  . pantoprazole (PROTONIX) 20 MG tablet Take 1 tablet (20 mg total) by mouth daily. 30 tablet 1  . promethazine (PHENERGAN) 25  MG suppository Place 1 suppository (25 mg total) rectally every 6 (six) hours as needed for nausea or vomiting. 12 each 0  . sucralfate (CARAFATE) 1 GM/10ML suspension Take 10 mLs (1 g total) by mouth 4 (four) times daily. 420 mL 1  . traMADol (ULTRAM) 50 MG tablet Take 1 tablet (50 mg total) by mouth every 6 (six) hours as needed for moderate pain. 12 tablet 0   No current facility-administered medications for this visit.     Allergies as of 03/05/2019 - Review Complete 02/18/2019  Allergen Reaction Noted  . Onion  01/12/2019    ROS:  General: Negative for anorexia, weight loss, fever, chills, fatigue, weakness. ENT: Negative for hoarseness, difficulty swallowing , nasal congestion. CV: Negative for chest pain, angina, palpitations, dyspnea on exertion, peripheral edema.  Respiratory: Negative for dyspnea at rest, dyspnea on exertion, cough, sputum, wheezing.  GI: See history of present illness. GU:  Negative for dysuria, hematuria, urinary incontinence, urinary frequency, nocturnal urination.  Endo: Negative for unusual weight change.    Physical Examination:   There were no vitals taken for this visit.  General: Well-nourished, well-developed in no acute distress.  Eyes: No icterus. Conjunctivae pink. Mouth: Oropharyngeal mucosa moist and pink , no lesions erythema or exudate. Lungs: Clear to auscultation bilaterally. Non-labored. Heart: Regular rate and rhythm, no murmurs rubs or gallops.  Abdomen: Bowel sounds are normal, nontender, nondistended, no hepatosplenomegaly or masses, no abdominal bruits or hernia , no rebound or guarding.   Extremities: No lower extremity edema. No clubbing or deformities. Neuro: Alert and oriented x 3.  Grossly intact. Skin: Warm and dry, no jaundice.   Psych: Alert and cooperative, normal mood and affect.   Imaging Studies: No results found.  Assessment and Plan:   Jasmine Mason is a 33 y.o. y/o female * here to follow up for persistent  nausea and vomiting. 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    Plan  1.Stop smoking , stop nsaids 2. Trulance for constipation  3. Dietary consult to lose weight 4. Continue on Protonix 5. Carafate QID 6 Bentyl for pain PRN 7.  Gastroparesis diet.  Dr Jonathon Bellows  MD,MRCP [facility]) Follow up in ***

## 2019-03-12 MED ORDER — GENERIC EXTERNAL MEDICATION
Status: DC
Start: ? — End: 2019-03-12

## 2019-03-12 MED ORDER — BARO-CAT PO
10.00 | ORAL | Status: DC
Start: ? — End: 2019-03-12

## 2019-03-15 MED ORDER — PROMETHAZINE HCL 25 MG/ML IJ SOLN
12.50 | INTRAMUSCULAR | Status: DC
Start: ? — End: 2019-03-15

## 2019-03-15 MED ORDER — ACETAMINOPHEN 650 MG RE SUPP
650.00 | RECTAL | Status: DC
Start: ? — End: 2019-03-15

## 2019-03-15 MED ORDER — GENERIC EXTERNAL MEDICATION
Status: DC
Start: ? — End: 2019-03-15

## 2019-03-15 MED ORDER — SODIUM CHLORIDE 0.9 % IV SOLN
10.00 | INTRAVENOUS | Status: DC
Start: ? — End: 2019-03-15

## 2019-03-15 MED ORDER — HYDRALAZINE HCL 20 MG/ML IJ SOLN
10.00 | INTRAMUSCULAR | Status: DC
Start: ? — End: 2019-03-15

## 2019-03-15 MED ORDER — ALUM & MAG HYDROXIDE-SIMETH 200-200-20 MG/5ML PO SUSP
30.00 | ORAL | Status: DC
Start: ? — End: 2019-03-15

## 2019-03-15 MED ORDER — PANTOPRAZOLE SODIUM 40 MG IV SOLR
40.00 | INTRAVENOUS | Status: DC
Start: 2019-03-14 — End: 2019-03-15

## 2019-03-15 MED ORDER — ALBUTEROL SULFATE (2.5 MG/3ML) 0.083% IN NEBU
2.50 | INHALATION_SOLUTION | RESPIRATORY_TRACT | Status: DC
Start: ? — End: 2019-03-15

## 2019-03-15 MED ORDER — NITROGLYCERIN 0.4 MG SL SUBL
0.40 | SUBLINGUAL_TABLET | SUBLINGUAL | Status: DC
Start: ? — End: 2019-03-15

## 2019-03-15 MED ORDER — METOCLOPRAMIDE HCL 5 MG/ML IJ SOLN
10.00 | INTRAMUSCULAR | Status: DC
Start: 2019-03-14 — End: 2019-03-15

## 2019-03-15 MED ORDER — ACETAMINOPHEN 325 MG PO TABS
650.00 | ORAL_TABLET | ORAL | Status: DC
Start: ? — End: 2019-03-15

## 2019-03-15 MED ORDER — ENOXAPARIN SODIUM 40 MG/0.4ML ~~LOC~~ SOLN
40.00 | SUBCUTANEOUS | Status: DC
Start: 2019-03-15 — End: 2019-03-15

## 2019-03-15 MED ORDER — MORPHINE SULFATE (PF) 2 MG/ML IV SOLN
2.00 | INTRAVENOUS | Status: DC
Start: ? — End: 2019-03-15

## 2019-03-15 MED ORDER — HYDROCODONE-ACETAMINOPHEN 5-325 MG PO TABS
1.00 | ORAL_TABLET | ORAL | Status: DC
Start: ? — End: 2019-03-15

## 2019-03-15 MED ORDER — SODIUM CHLORIDE 0.9 % IV SOLN
100.00 | INTRAVENOUS | Status: DC
Start: ? — End: 2019-03-15

## 2019-06-03 MED ORDER — SODIUM CHLORIDE 0.9 % IV SOLN
10.00 | INTRAVENOUS | Status: DC
Start: ? — End: 2019-06-03

## 2019-06-03 MED ORDER — GENERIC EXTERNAL MEDICATION
Status: DC
Start: ? — End: 2019-06-03

## 2019-06-12 ENCOUNTER — Other Ambulatory Visit: Payer: Self-pay

## 2019-06-12 DIAGNOSIS — I10 Essential (primary) hypertension: Secondary | ICD-10-CM

## 2019-06-22 MED ORDER — HYDROCHLOROTHIAZIDE 12.5 MG PO TABS: 12.5000 mg | ORAL_TABLET | Freq: Every day | ORAL | 0 refills | Status: DC

## 2019-06-23 ENCOUNTER — Ambulatory Visit: Payer: Self-pay | Admitting: Family Medicine

## 2019-06-24 ENCOUNTER — Other Ambulatory Visit: Payer: Self-pay

## 2019-06-24 DIAGNOSIS — K219 Gastro-esophageal reflux disease without esophagitis: Secondary | ICD-10-CM

## 2019-06-24 MED ORDER — PANTOPRAZOLE SODIUM 20 MG PO TBEC: 20.0000 mg | DELAYED_RELEASE_TABLET | Freq: Every day | ORAL | 0 refills | Status: DC

## 2019-06-24 NOTE — Addendum Note (Signed)
Addended by: Olin Hauser on: 06/24/2019 12:53 PM   Modules accepted: Orders

## 2019-06-29 ENCOUNTER — Other Ambulatory Visit: Payer: Self-pay

## 2019-07-18 ENCOUNTER — Other Ambulatory Visit: Payer: Self-pay

## 2019-07-18 ENCOUNTER — Emergency Department
Admission: EM | Admit: 2019-07-18 | Discharge: 2019-07-18 | Disposition: A | Discharge status: Home / Self Care | Payer: Self-pay | Attending: Emergency Medicine | Admitting: Emergency Medicine

## 2019-07-18 ENCOUNTER — Encounter: Payer: Self-pay | Admitting: Emergency Medicine

## 2019-07-18 DIAGNOSIS — R519 Headache, unspecified: Secondary | ICD-10-CM | POA: Insufficient documentation

## 2019-07-18 DIAGNOSIS — I10 Essential (primary) hypertension: Secondary | ICD-10-CM | POA: Insufficient documentation

## 2019-07-18 DIAGNOSIS — F17210 Nicotine dependence, cigarettes, uncomplicated: Secondary | ICD-10-CM | POA: Insufficient documentation

## 2019-07-18 DIAGNOSIS — Z79899 Other long term (current) drug therapy: Secondary | ICD-10-CM | POA: Insufficient documentation

## 2019-07-18 DIAGNOSIS — K047 Periapical abscess without sinus: Secondary | ICD-10-CM | POA: Insufficient documentation

## 2019-07-18 MED ORDER — FEXOFENADINE-PSEUDOEPHED ER 60-120 MG PO TB12: 1.0000 | ORAL_TABLET | Freq: Two times a day (BID) | ORAL | 0 refills | Status: DC

## 2019-07-18 MED ORDER — AMOXICILLIN 875 MG PO TABS: 875.0000 mg | ORAL_TABLET | Freq: Two times a day (BID) | ORAL | 0 refills | Status: DC

## 2019-07-18 MED ORDER — TRAMADOL HCL 50 MG PO TABS: 50.0000 mg | ORAL_TABLET | Freq: Two times a day (BID) | ORAL | 0 refills | Status: AC | PRN

## 2019-07-18 MED ORDER — KETOROLAC TROMETHAMINE 60 MG/2ML IM SOLN
60.0000 mg | Freq: Once | INTRAMUSCULAR | Status: AC
  Administered 2019-07-18: 09:00:00 60 mg via INTRAMUSCULAR
  Filled 2019-07-18: qty 2

## 2019-07-18 NOTE — ED Triage Notes (Signed)
Pt to ED via POV c/o headache and busted blood vessel in her right eye. Pt states that she has had intermittent fevers. Pt states that she has had body aches and chills. Pt states that she was tested for COVID recently and it was negative. Pt is in NAD.

## 2019-07-18 NOTE — Discharge Instructions (Signed)
Follow discharge care instruction take medication as directed.  Advised to follow-up for list of dental clinics given your discharge care instructions.  OPTIONS FOR DENTAL FOLLOW UP CARE  [facility] [place] [email or web address] [phone])  [facility] [phone])  [place] [phone])  [place] [phone])  [facility] [phone]) This clinic caters to the indigent population and is on a lottery system. Location: [employer] [facility], [place], [place], [place] Clinic Hours: Wednesdays from 6pm - 9pm, patients seen by a lottery system. For dates, call or go to [email or web address] Services: Cleanings, fillings and simple extractions. Payment Options: DENTAL WORK IS FREE OF CHARGE. Bring proof of income or support. Best way to get seen: Arrive at 5:15 pm - this is a lottery, NOT first come/first serve, so arriving earlier will not increase your chances of being seen.     [facility] [phone] Select option 1 for emergencies   Location: [facility], [place], [address] [place] Clinic Hours: No walk-ins accepted - call the day before to schedule an appointment. Check in times are 9:30 am and 1:30 pm. Services: Simple extractions, temporary fillings, pulpectomy/pulp debridement, uncomplicated abscess drainage. Payment Options: PAYMENT IS DUE AT THE TIME OF SERVICE.  Fee is usually $100-200, additional surgical procedures (e.g. abscess drainage) may be extra. Cash, checks, Visa/MasterCard accepted.  Can file Medicaid if patient is covered for dental - patient should call case worker to check. No discount for [facility] patients. Best way to get seen: MUST call the day  before and get onto the schedule. Can usually be seen the next 1-2 days. No walk-ins accepted.     [place] [phone]   Location: [place], [place] [facility] Hours: M, W, Th, F 8am or 1:30pm, Tues 9a or 1:30 - first come/first served. Services: Simple extractions, temporary fillings, uncomplicated abscess drainage.  You do not need to be an [facility] resident. Payment Options: PAYMENT IS DUE AT THE TIME OF SERVICE. Dental insurance, otherwise sliding scale - bring proof of income or support. Depending on income and treatment needed, cost is usually $50-200. Best way to get seen: Arrive early as it is first come/first served.     [facility] [phone]   Location: [place] [facility] Hours: Mon-Thu 8a-5p Services: Most basic dental services including extractions and fillings. Payment Options: PAYMENT IS DUE AT THE TIME OF SERVICE. Sliding scale, up to 50% off - bring proof if income or support. Medicaid with dental option accepted. Best way to get seen: Call to schedule an appointment, can usually be seen within 2 weeks OR they will try to see walk-ins - show up at [place] or 2p (you may have to wait).     [facility] [place] RESIDENTS ONLY   Location: [facility], [place] [address], [place], [place] [address] Clinic Hours: By appointment only. Monday - Thursday 8am-5pm, Friday 8am-12pm Services: Cleanings, fillings, extractions. Payment Options: PAYMENT IS DUE AT THE TIME OF SERVICE. Cash, Visa or MasterCard. Sliding scale - $30 minimum per service. Best way to get seen: Come in to office, complete packet and make an appointment - need proof of income or support monies for each household member and proof of [facility] residence. Usually  takes about a month to get in.     [facility] [phone]   Location: [address], [place] [facility] Hours: Walk-in Urgent Care Dental Services are offered Monday-Friday mornings only. The numbers of emergencies accepted daily is limited to the number of providers available. Maximum 15 - Mondays, Wednesdays & Thursdays Maximum 10 - Tuesdays & Fridays Services: You do not need to be a [facility] resident to be seen for a dental emergency. Emergencies are defined as pain, swelling, abnormal bleeding, or dental trauma. Walkins will receive x-rays if needed. NOTE: Dental cleaning is not an emergency. Payment Options: PAYMENT IS DUE AT THE TIME OF SERVICE. Minimum co-pay is $40.00 for uninsured patients. Minimum co-pay is $3.00 for Medicaid with dental coverage. Dental Insurance is accepted and must be presented at time of visit. Medicare does not cover dental. Forms of payment: Cash, credit card, checks. Best way to get seen: If not previously registered with the clinic, walk-in dental registration begins at 7:15 am and is on a first come/first serve basis. If previously registered with the clinic, call to make an appointment.     [facility] [place] ONLY   Location: [address] [place], [place] Clinic Hours: Mon-Thu 10a-2p Services: Extractions only! Payment Options: FREE (donations accepted) - bring proof of income or support Best way to get seen: Call and schedule an appointment OR come at 8am on the 1st Monday of every month (except for holidays) when it is first come/first served.     [facility] [phone]   Location: [place], [place] [facility] Hours: Friday mornings Services, Payment Options, Best way to get seen: Call for info

## 2019-07-18 NOTE — ED Notes (Signed)
See triage note  Presents with left temporal h/a for about 1 week  States she has tried OTC meds w/o relief  Low grade temp on arrival    Denies any trauma or n/v

## 2019-07-18 NOTE — ED Provider Notes (Signed)
[facility] Emergency Department Provider Note   ____________________________________________   First MD Initiated Contact with Patient 07/18/19 0831     (approximate)  I have reviewed the triage vital signs and the nursing notes.   HISTORY  Chief Complaint Headache    HPI Jasmine Mason is a 33 y.o. female patient complain of frontal headache and dental pain for 1 week.  Patient states intermitting fever.  Patient stated facial pain mostly on the left side.  Patient rates her pain as a 9/10.  Patient described the pain is "throbbing/pressure".  No palliative measure for complaint.         Past Medical History:  Diagnosis Date  . Back pain   . Back pain   . GERD (gastroesophageal reflux disease)   . Hypertension   . Mesenteric adenitis   . Obesity   . PCOS (polycystic ovarian syndrome)   . Tobacco abuse     Patient Active Problem List   Diagnosis Date Noted  . 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 Encounter for smoking cessation counseling 04/18/2017  . Essential hypertension 04/18/2017  . Epigastric abdominal pain   . Acute gastritis 09/20/2016  . Nausea with vomiting   . Hiatal hernia   . Uncontrollable vomiting 03/10/2016  . Abdominal pain, epigastric   . GERD (gastroesophageal reflux disease) 01/24/2015  . Hypokalemia 01/24/2015  . Abdominal pain 01/24/2015  . Leukocytosis 01/24/2015  . Chest pain 01/24/2015  . Cough 01/24/2015  . Pain of left calf 01/24/2015  . Elevated troponin 01/24/2015  . Obesity   . Mesenteric adenitis   . Tobacco abuse     Past Surgical History:  Procedure Laterality Date  . CARPAL TUNNEL RELEASE Left ~ 2013  . COLONOSCOPY WITH PROPOFOL N/A 08/08/2017   Procedure: COLONOSCOPY WITH PROPOFOL;  Surgeon: Jonathon Bellows, MD;  Location: [facility] ENDOSCOPY;  Service: Gastroenterology;  Laterality: N/A;  . ESOPHAGOGASTRODUODENOSCOPY N/A 01/25/2015   Procedure: ESOPHAGOGASTRODUODENOSCOPY (EGD);  Surgeon: Irene Shipper, MD;  Location: [facility] ENDOSCOPY;  Service: Endoscopy;  Laterality: N/A;  . ESOPHAGOGASTRODUODENOSCOPY (EGD) WITH PROPOFOL N/A 03/13/2016   Procedure: ESOPHAGOGASTRODUODENOSCOPY (EGD) WITH PROPOFOL;  Surgeon: Lucilla Lame, MD;  Location: [facility] ENDOSCOPY;  Service: Endoscopy;  Laterality: N/A;  . ESOPHAGOGASTRODUODENOSCOPY (EGD) WITH PROPOFOL N/A 07/05/2017   Procedure: ESOPHAGOGASTRODUODENOSCOPY (EGD) WITH PROPOFOL;  Surgeon: Jonathon Bellows, MD;  Location: [facility] ENDOSCOPY;  Service: Gastroenterology;  Laterality: N/A;  . FRACTURE SURGERY    . ORIF ANKLE FRACTURE Left 1990's    Prior to Admission medications   Medication Sig Start Date End Date Taking? Authorizing Provider  alum & mag hydroxide-simeth (MAALOX MAX) 400-400-40 MG/5ML suspension Take 5 mLs by mouth every 6 (six) hours as needed for indigestion.    [provider]  amoxicillin (AMOXIL) 875 MG tablet Take 1 tablet (875 mg total) by mouth 2 (two) times daily. 07/18/19   Sable Feil, PA-C  fexofenadine-pseudoephedrine (ALLEGRA-D) 60-120 MG 12 hr tablet Take 1 tablet by mouth 2 (two) times daily. 07/18/19   Sable Feil, PA-C  hydrochlorothiazide (HYDRODIURIL) 12.5 MG tablet Take 1 tablet (12.5 mg total) by mouth daily. 06/22/19   Karamalegos, Devonne Doughty, DO  pantoprazole (PROTONIX) 20 MG tablet Take 1 tablet (20 mg total) by mouth daily before breakfast. 06/24/19 07/24/19  Karamalegos, Devonne Doughty, DO  traMADol (ULTRAM) 50 MG tablet Take 1 tablet (50 mg total) by mouth every 12 (twelve) hours as needed for up to 5 days. 07/18/19 07/23/19  Sable Feil, PA-C  famotidine (PEPCID) 20 MG tablet Take 1 tablet (20 mg total) by mouth daily. 09/26/18 01/14/19  Cuthriell, Charline Bills, PA-C    Allergies Onion  Family History  Problem Relation Age of Onset  . Hypertension Mother   . Hypertension Father   . Diabetes Father   . Heart disease Father   . Hypertension Sister   . Throat cancer Maternal Grandfather   . Stroke Paternal  Grandmother   . Diabetes Paternal Grandmother   . Ovarian cancer Neg Hx   . Colon cancer Neg Hx   . Breast cancer Neg Hx     Social History Social History   Tobacco Use  . Smoking status: Current Every Day Smoker    Packs/day: 0.25    Years: 10.00    Pack years: 2.50    Types: Cigarettes  . Smokeless tobacco: Never Used  Substance Use Topics  . Alcohol use: No    Alcohol/week: 0.0 standard drinks  . Drug use: No    Review of Systems Constitutional: No fever/chills Eyes: No visual changes. ENT: No sore throat.  Dental pain Cardiovascular: Denies chest pain. Respiratory: Denies shortness of breath. Gastrointestinal: No abdominal pain.  No nausea, no vomiting.  No diarrhea.  No constipation. Genitourinary: Negative for dysuria. Musculoskeletal: Negative for back pain. Skin: Negative for rash. Neurological: Positive for frontal headaches, but denies focal weakness or numbness. Allergic/Immunilogical: Onions ____________________________________________   PHYSICAL EXAM:  VITAL SIGNS: ED Triage Vitals  Enc Vitals Group     BP 07/18/19 0807 (!) 149/93     Pulse Rate 07/18/19 0807 (!) 107     Resp 07/18/19 0807 15     Temp 07/18/19 0807 99.1 F (37.3 C)     Temp Source 07/18/19 0807 Oral     SpO2 07/18/19 0807 98 %     Weight 07/18/19 0807 220 lb (99.8 kg)     Height 07/18/19 0807 5\' 7"  (1.702 m)     Head Circumference --      Peak Flow --      Pain Score 07/18/19 0813 9     Pain Loc --      Pain Edu? --      Excl. in [place]? --    Constitutional: Alert and oriented. Well appearing and in no acute distress. Eyes: Conjunctivae are normal. PERRL. EOMI. Head: Atraumatic. Nose: Edematous nasal turbinates with thick rhinorrhea.  Bilateral maxillary guarding with palpation. Mouth/Throat: Mucous membranes are moist.  Oropharynx non-erythematous.  Postnasal drainage.  Edematous upper incisor gingiva. Neck: No stridor.  Hematological/Lymphatic/Immunilogical: No cervical  lymphadenopathy. Cardiovascular: Normal rate, regular rhythm. Grossly normal heart sounds.  Good peripheral circulation. Respiratory: Normal respiratory effort.  No retractions. Lungs CTAB. Neurologic:  Normal speech and language. No gross focal neurologic deficits are appreciated. No gait instability. Skin:  Skin is warm, dry and intact. No rash noted. Psychiatric: Mood and affect are normal. Speech and behavior are normal.  ____________________________________________   LABS (all labs ordered are listed, but only abnormal results are displayed)  Labs Reviewed - No data to display ____________________________________________  EKG   ____________________________________________  RADIOLOGY  ED MD interpretation:    Official radiology report(s): No results found.  ____________________________________________   PROCEDURES  Procedure(s) performed (including Critical Care):  Procedures   ____________________________________________   INITIAL IMPRESSION / ASSESSMENT AND PLAN / ED COURSE  As part of my medical decision making, I reviewed the following data within the [place]     Patient presents with dental  pain and headache.  Physical exam consistent with dental abscess and sinus headache.  Patient given discharge care instruction advised take medication as directed.  Patient given list of dental clinics for follow-up care.    Jasmine Mason was evaluated in Emergency Department on 07/18/2019 for the symptoms described in the history of present illness. She was evaluated in the context of the global COVID-19 pandemic, which necessitated consideration that the patient might be at risk for infection with the SARS-CoV-2 virus that causes COVID-19. Institutional protocols and algorithms that pertain to the evaluation of patients at risk for COVID-19 are in a state of rapid change based on information released by regulatory bodies including the CDC and federal  and state organizations. These policies and algorithms were followed during the patient's care in the ED.       ____________________________________________   FINAL CLINICAL IMPRESSION(S) / ED DIAGNOSES  Final diagnoses:  Dental abscess  Sinus headache     ED Discharge Orders         Ordered    amoxicillin (AMOXIL) 875 MG tablet  2 times daily     07/18/19 0909    fexofenadine-pseudoephedrine (ALLEGRA-D) 60-120 MG 12 hr tablet  2 times daily     07/18/19 0909    traMADol (ULTRAM) 50 MG tablet  Every 12 hours PRN     07/18/19 0909           Note:  This document was prepared using Dragon voice recognition software and may include unintentional dictation errors.    Sable Feil, PA-C 07/18/19 [id number]    Harvest Dark, MD 07/18/19 [phone]

## 2019-07-21 ENCOUNTER — Other Ambulatory Visit: Payer: Self-pay

## 2019-07-21 ENCOUNTER — Emergency Department
Admission: EM | Admit: 2019-07-21 | Discharge: 2019-07-21 | Disposition: A | Discharge status: Home / Self Care | Payer: Self-pay | Attending: Emergency Medicine | Admitting: Emergency Medicine

## 2019-07-21 ENCOUNTER — Encounter: Payer: Self-pay | Admitting: Emergency Medicine

## 2019-07-21 ENCOUNTER — Emergency Department: Payer: Self-pay

## 2019-07-21 DIAGNOSIS — Z20828 Contact with and (suspected) exposure to other viral communicable diseases: Secondary | ICD-10-CM | POA: Insufficient documentation

## 2019-07-21 DIAGNOSIS — Z79899 Other long term (current) drug therapy: Secondary | ICD-10-CM | POA: Insufficient documentation

## 2019-07-21 DIAGNOSIS — F17210 Nicotine dependence, cigarettes, uncomplicated: Secondary | ICD-10-CM | POA: Insufficient documentation

## 2019-07-21 DIAGNOSIS — I10 Essential (primary) hypertension: Secondary | ICD-10-CM | POA: Insufficient documentation

## 2019-07-21 DIAGNOSIS — J069 Acute upper respiratory infection, unspecified: Secondary | ICD-10-CM | POA: Insufficient documentation

## 2019-07-21 DIAGNOSIS — Z20822 Contact with and (suspected) exposure to covid-19: Secondary | ICD-10-CM

## 2019-07-21 LAB — SARS CORONAVIRUS 2 (TAT 6-24 HRS): SARS Coronavirus 2: NEGATIVE

## 2019-07-21 MED ORDER — CEFTRIAXONE SODIUM 1 G IJ SOLR
1.0000 g | Freq: Once | INTRAMUSCULAR | Status: AC
  Administered 2019-07-21: 1 g via INTRAMUSCULAR
  Filled 2019-07-21: qty 10

## 2019-07-21 MED ORDER — AMOXICILLIN 875 MG PO TABS: 875.0000 mg | ORAL_TABLET | Freq: Two times a day (BID) | ORAL | 0 refills | Status: DC

## 2019-07-21 MED ORDER — TRAMADOL HCL 50 MG PO TABS
50.0000 mg | ORAL_TABLET | Freq: Once | ORAL | Status: AC
  Administered 2019-07-21: 50 mg via ORAL
  Filled 2019-07-21: qty 1

## 2019-07-21 NOTE — Discharge Instructions (Signed)
Call and follow-up with your primary care provider if any continued problems.  Begin taking antibiotic as directed twice a day until finished.  This medication was sent to medication management.  The tramadol was sent to [facility] on your last visit.  This can be taken along with Tylenol or ibuprofen if needed for pain control.  Increase fluids.  Your Covid test results should be back tomorrow.  If it is positive you will receive results.  You need to quarantine until you have received the results and if it is positive you will need an additional 10 days of quarantine.

## 2019-07-21 NOTE — ED Provider Notes (Signed)
[facility] Emergency Department Provider Note  ____________________________________________   First MD Initiated Contact with Patient 07/21/19 [phone]     (approximate)  I have reviewed the triage vital signs and the nursing notes.   HISTORY  Chief Complaint Headache, Nasal Congestion, and Fever    HPI Jasmine Mason is a 33 y.o. female presents to the ED with complaint of continued dental abscess.  Patient was seen on 07/18/2019 for a dental abscess that is been going on for 1 week and "throbbing pressure".  Patient was prescribed amoxicillin 875 and tramadol as needed for pain.  Patient states that she did not get this prescription filled as she "did not have the money".  She states now pain has increased.  She denies any fever, chills, nausea or vomiting.  Denies any changes in taste or smell.  She denies any known exposure to Covid.  She has been taking tramadol that she already had at home with minimal relief.  She rates her pain as an 8 out of 10.      Past Medical History:  Diagnosis Date  . Back pain   . Back pain   . GERD (gastroesophageal reflux disease)   . Hypertension   . Mesenteric adenitis   . Obesity   . PCOS (polycystic ovarian syndrome)   . Tobacco abuse     Patient Active Problem List   Diagnosis Date Noted  . 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 Encounter for smoking cessation counseling 04/18/2017  . Essential hypertension 04/18/2017  . Epigastric abdominal pain   . Acute gastritis 09/20/2016  . Nausea with vomiting   . Hiatal hernia   . Uncontrollable vomiting 03/10/2016  . Abdominal pain, epigastric   . GERD (gastroesophageal reflux disease) 01/24/2015  . Hypokalemia 01/24/2015  . Abdominal pain 01/24/2015  . Leukocytosis 01/24/2015  . Chest pain 01/24/2015  . Cough 01/24/2015  . Pain of left calf 01/24/2015  . Elevated troponin 01/24/2015  . Obesity   . Mesenteric adenitis   . Tobacco abuse     Past Surgical  History:  Procedure Laterality Date  . CARPAL TUNNEL RELEASE Left ~ 2013  . COLONOSCOPY WITH PROPOFOL N/A 08/08/2017   Procedure: COLONOSCOPY WITH PROPOFOL;  Surgeon: Jonathon Bellows, MD;  Location: [facility] ENDOSCOPY;  Service: Gastroenterology;  Laterality: N/A;  . ESOPHAGOGASTRODUODENOSCOPY N/A 01/25/2015   Procedure: ESOPHAGOGASTRODUODENOSCOPY (EGD);  Surgeon: Irene Shipper, MD;  Location: [facility] ENDOSCOPY;  Service: Endoscopy;  Laterality: N/A;  . ESOPHAGOGASTRODUODENOSCOPY (EGD) WITH PROPOFOL N/A 03/13/2016   Procedure: ESOPHAGOGASTRODUODENOSCOPY (EGD) WITH PROPOFOL;  Surgeon: Lucilla Lame, MD;  Location: [facility] ENDOSCOPY;  Service: Endoscopy;  Laterality: N/A;  . ESOPHAGOGASTRODUODENOSCOPY (EGD) WITH PROPOFOL N/A 07/05/2017   Procedure: ESOPHAGOGASTRODUODENOSCOPY (EGD) WITH PROPOFOL;  Surgeon: Jonathon Bellows, MD;  Location[facility] ENDOSCOPY;  Service: Gastroenterology;  Laterality: N/A;  . FRACTURE SURGERY    . ORIF ANKLE FRACTURE Left 1990's    Prior to Admission medications   Medication Sig Start Date End Date Taking? Authorizing Provider  alum & mag hydroxide-simeth (MAALOX MAX) 400-400-40 MG/5ML suspension Take 5 mLs by mouth every 6 (six) hours as needed for indigestion.    [provider]  amoxicillin (AMOXIL) 875 MG tablet Take 1 tablet (875 mg total) by mouth 2 (two) times daily. 07/21/19   Johnn Hai, PA-C  fexofenadine-pseudoephedrine (ALLEGRA-D) 60-120 MG 12 hr tablet Take 1 tablet by mouth 2 (two) times daily. 07/18/19   Sable Feil, PA-C  hydrochlorothiazide (HYDRODIURIL) 12.5 MG tablet Take  1 tablet (12.5 mg total) by mouth daily. 06/22/19   Karamalegos, Devonne Doughty, DO  pantoprazole (PROTONIX) 20 MG tablet Take 1 tablet (20 mg total) by mouth daily before breakfast. 06/24/19 07/24/19  Karamalegos, Devonne Doughty, DO  traMADol (ULTRAM) 50 MG tablet Take 1 tablet (50 mg total) by mouth every 12 (twelve) hours as needed for up to 5 days. 07/18/19 07/23/19  Sable Feil, PA-C    famotidine (PEPCID) 20 MG tablet Take 1 tablet (20 mg total) by mouth daily. 09/26/18 01/14/19  Cuthriell, Charline Bills, PA-C    Allergies Onion  Family History  Problem Relation Age of Onset  . Hypertension Mother   . Hypertension Father   . Diabetes Father   . Heart disease Father   . Hypertension Sister   . Throat cancer Maternal Grandfather   . Stroke Paternal Grandmother   . Diabetes Paternal Grandmother   . Ovarian cancer Neg Hx   . Colon cancer Neg Hx   . Breast cancer Neg Hx     Social History Social History   Tobacco Use  . Smoking status: Current Every Day Smoker    Packs/day: 0.25    Years: 10.00    Pack years: 2.50    Types: Cigarettes  . Smokeless tobacco: Never Used  Substance Use Topics  . Alcohol use: No    Alcohol/week: 0.0 standard drinks  . Drug use: No    Review of Systems Constitutional: No fever/chills Eyes: No visual changes. ENT: No sore throat.  Positive for dental pain. Cardiovascular: Denies chest pain. Respiratory: Denies shortness of breath. Gastrointestinal: No abdominal pain.  No nausea, no vomiting.  No diarrhea.  Genitourinary: Negative for dysuria. Musculoskeletal: Negative for muscle aches. Skin: Negative for rash. Neurological: Positive for headaches, negative for focal weakness or numbness. ____________________________________________   PHYSICAL EXAM:  VITAL SIGNS: ED Triage Vitals  Enc Vitals Group     BP 07/21/19 0847 (!) 160/91     Pulse Rate 07/21/19 0847 (!) 118     Resp 07/21/19 0847 20     Temp 07/21/19 0847 98 F (36.7 C)     Temp Source 07/21/19 0847 Oral     SpO2 07/21/19 0847 99 %     Weight 07/21/19 0844 220 lb (99.8 kg)     Height 07/21/19 0844 5\' 7"  (1.702 m)     Head Circumference --      Peak Flow --      Pain Score 07/21/19 0844 8     Pain Loc --      Pain Edu? --      Excl. in [place]? --    Constitutional: Alert and oriented. Well appearing and in no acute distress. Eyes: Conjunctivae are normal.   Head: Atraumatic. Nose: No congestion/rhinnorhea. Mouth/Throat: Mucous membranes are moist.  Oropharynx non-erythematous. Neck: No stridor.   Hematological/Lymphatic/Immunilogical: No cervical lymphadenopathy. Cardiovascular: Normal rate, regular rhythm. Grossly normal heart sounds.  Good peripheral circulation. Respiratory: Normal respiratory effort.  No retractions. Lungs CTAB. Musculoskeletal: Moves upper and lower extremities without any difficulty.  Normal gait was noted. Neurologic:  Normal speech and language. No gross focal neurologic deficits are appreciated. No gait instability. Skin:  Skin is warm, dry and intact. No rash noted. Psychiatric: Mood and affect are normal. Speech and behavior are normal.  ____________________________________________   LABS (all labs ordered are listed, but only abnormal results are displayed)  Labs Reviewed  SARS CORONAVIRUS 2 (TAT 6-24 HRS)    RADIOLOGY  Official radiology  report(s): DG Chest Portable 1 View  Result Date: 07/21/2019 CLINICAL DATA:  Cough and fever EXAM: PORTABLE CHEST 1 VIEW COMPARISON:  November 04, 2018 FINDINGS: Lungs are clear. Heart size and pulmonary vascularity are normal. No adenopathy. No bone lesions. IMPRESSION: No edema or consolidation. Electronically Signed   By: Lowella Grip III M.D.   On: 07/21/2019 09:47    ____________________________________________   PROCEDURES  Procedure(s) performed (including Critical Care):  Procedures   ____________________________________________   INITIAL IMPRESSION / ASSESSMENT AND PLAN / ED COURSE  As part of my medical decision making, I reviewed the following data within the electronic MEDICAL RECORD NUMBER Notes from prior ED visits and [place] Controlled Substance Database  Jasmine Mason was evaluated in Emergency Department on 07/21/2019 for the symptoms described in the history of present illness. She was evaluated in the context of the global COVID-19 pandemic,  which necessitated consideration that the patient might be at risk for infection with the SARS-CoV-2 virus that causes COVID-19. Institutional protocols and algorithms that pertain to the evaluation of patients at risk for COVID-19 are in a state of rapid change based on information released by regulatory bodies including the CDC and federal and state organizations. These policies and algorithms were followed during the patient's care in the ED.  33 year old female presents to the ED with complaint of dental pain and sinus congestion.  Patient was seen on 07/18/2019 at which time she was prescribed amoxicillin and tramadol.  Patient states that she did not get the prescription filled as she "did not have any money".  She denies any fever, chills, nausea or vomiting.  There is been no exposure to Covid to her knowledge.  After talking with her it was discovered that she gets most of her medication at medication management which was not open on the 26.  The amoxicillin prescription was sent to medication management however they did not fill prescriptions for narcotics or controlled substances.  She was made aware that her tramadol prescription was sent to [facility] on the 26 per her request.  Patient received an injection of Rocephin 1 g IM prior to discharge.  She is encouraged to take all medication until completely finished on her antibiotics.  ____________________________________________   FINAL CLINICAL IMPRESSION(S) / ED DIAGNOSES  Final diagnoses:  Upper respiratory tract infection, unspecified type  COVID-19 ruled out     ED Discharge Orders         Ordered    amoxicillin (AMOXIL) 875 MG tablet  2 times daily     07/21/19 1047           Note:  This document was prepared using Dragon voice recognition software and may include unintentional dictation errors.    Johnn Hai, PA-C 07/21/19 1445    Earleen Newport, MD 07/21/19 [phone]

## 2019-07-21 NOTE — ED Triage Notes (Signed)
Pt c/o headache, cough, congestion and fever for a week.

## 2019-07-21 NOTE — ED Notes (Signed)
See triage note  Presents with frontal headache  States h/a started about 1 week ago  States she has also had cough and fever at home  Afebrile on arrival   Was seen for same about 3 days ago

## 2019-08-05 ENCOUNTER — Emergency Department
Admission: EM | Admit: 2019-08-05 | Discharge: 2019-08-05 | Disposition: A | Discharge status: Home / Self Care | Payer: Self-pay | Attending: Student | Admitting: Student

## 2019-08-05 ENCOUNTER — Other Ambulatory Visit: Payer: Self-pay

## 2019-08-05 ENCOUNTER — Encounter: Payer: Self-pay | Admitting: Emergency Medicine

## 2019-08-05 DIAGNOSIS — F17210 Nicotine dependence, cigarettes, uncomplicated: Secondary | ICD-10-CM | POA: Insufficient documentation

## 2019-08-05 DIAGNOSIS — J0140 Acute pansinusitis, unspecified: Secondary | ICD-10-CM | POA: Insufficient documentation

## 2019-08-05 DIAGNOSIS — Z79899 Other long term (current) drug therapy: Secondary | ICD-10-CM | POA: Insufficient documentation

## 2019-08-05 DIAGNOSIS — I10 Essential (primary) hypertension: Secondary | ICD-10-CM | POA: Insufficient documentation

## 2019-08-05 MED ORDER — AMOXICILLIN-POT CLAVULANATE 875-125 MG PO TABS: 1.0000 | ORAL_TABLET | Freq: Two times a day (BID) | ORAL | 0 refills | Status: AC

## 2019-08-05 MED ORDER — PREDNISONE 10 MG PO TABS: ORAL_TABLET | ORAL | 0 refills | Status: DC

## 2019-08-05 NOTE — Discharge Instructions (Signed)
Follow-up with your primary care provider or Dr. Richardson Landry who is on-call for [facility] if you continue having problems with your sinuses.  Increase fluids.  You may take Tylenol with the medication that was prescribed for you.  Take all of the antibiotic until completely finished.  Augmentin 875 twice a day for the next 10 days and prednisone tapering down by 1 tablet each day until completely finished.

## 2019-08-05 NOTE — ED Triage Notes (Signed)
URI x2 weeks ago , x1 week headache/double vision.  Nausea (sinus drainage) , productive cough (dark yellow/green)

## 2019-08-05 NOTE — ED Provider Notes (Signed)
[facility] Emergency Department Provider Note  ___________________________________________   First MD Initiated Contact with Patient 08/05/19 [phone]     (approximate)  I have reviewed the triage vital signs and the nursing notes.   HISTORY  Chief Complaint URI  HPI Amyla Hainsworth is a 34 y.o. female presents to the ED with complaint of URI symptoms for 2 weeks.  Patient states that this past week she has developed headaches, dental pain, productive cough and nausea secondary to sinus drainage.  She denies any known Covid exposure.  She is unaware of any fever or chills.  She states that she continues to smell and taste things, no body aches, no vomiting or diarrhea.  Currently she rates her pain as 9 out of 10.      Past Medical History:  Diagnosis Date  . Back pain   . Back pain   . GERD (gastroesophageal reflux disease)   . Hypertension   . Mesenteric adenitis   . Obesity   . PCOS (polycystic ovarian syndrome)   . Tobacco abuse     Patient Active Problem List   Diagnosis Date Noted  . 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 Encounter for smoking cessation counseling 04/18/2017  . Essential hypertension 04/18/2017  . Epigastric abdominal pain   . Acute gastritis 09/20/2016  . Nausea with vomiting   . Hiatal hernia   . Uncontrollable vomiting 03/10/2016  . Abdominal pain, epigastric   . GERD (gastroesophageal reflux disease) 01/24/2015  . Hypokalemia 01/24/2015  . Abdominal pain 01/24/2015  . Leukocytosis 01/24/2015  . Chest pain 01/24/2015  . Cough 01/24/2015  . Pain of left calf 01/24/2015  . Elevated troponin 01/24/2015  . Obesity   . Mesenteric adenitis   . Tobacco abuse     Past Surgical History:  Procedure Laterality Date  . CARPAL TUNNEL RELEASE Left ~ 2013  . COLONOSCOPY WITH PROPOFOL N/A 08/08/2017   Procedure: COLONOSCOPY WITH PROPOFOL;  Surgeon: Jonathon Bellows, MD;  Location: [facility] ENDOSCOPY;  Service: Gastroenterology;   Laterality: N/A;  . ESOPHAGOGASTRODUODENOSCOPY N/A 01/25/2015   Procedure: ESOPHAGOGASTRODUODENOSCOPY (EGD);  Surgeon: Irene Shipper, MD;  Location: [facility] ENDOSCOPY;  Service: Endoscopy;  Laterality: N/A;  . ESOPHAGOGASTRODUODENOSCOPY (EGD) WITH PROPOFOL N/A 03/13/2016   Procedure: ESOPHAGOGASTRODUODENOSCOPY (EGD) WITH PROPOFOL;  Surgeon: Lucilla Lame, MD;  Location: [facility] ENDOSCOPY;  Service: Endoscopy;  Laterality: N/A;  . ESOPHAGOGASTRODUODENOSCOPY (EGD) WITH PROPOFOL N/A 07/05/2017   Procedure: ESOPHAGOGASTRODUODENOSCOPY (EGD) WITH PROPOFOL;  Surgeon: Jonathon Bellows, MD;  Location: [facility] ENDOSCOPY;  Service: Gastroenterology;  Laterality: N/A;  . FRACTURE SURGERY    . ORIF ANKLE FRACTURE Left 1990's    Prior to Admission medications   Medication Sig Start Date End Date Taking? Authorizing Provider  alum & mag hydroxide-simeth (MAALOX MAX) 400-400-40 MG/5ML suspension Take 5 mLs by mouth every 6 (six) hours as needed for indigestion.    [provider]  amoxicillin-clavulanate (AUGMENTIN) 875-125 MG tablet Take 1 tablet by mouth 2 (two) times daily for 7 days. 08/05/19 08/12/19  Johnn Hai, PA-C  hydrochlorothiazide (HYDRODIURIL) 12.5 MG tablet Take 1 tablet (12.5 mg total) by mouth daily. 06/22/19   Karamalegos, Devonne Doughty, DO  pantoprazole (PROTONIX) 20 MG tablet Take 1 tablet (20 mg total) by mouth daily before breakfast. 06/24/19 07/24/19  Karamalegos, Devonne Doughty, DO  predniSONE (DELTASONE) 10 MG tablet Take 6 tablets  today, on day 2 take 5 tablets, day 3 take 4 tablets, day 4 take 3 tablets, day 5 take  2  tablets and 1 tablet the last day 08/05/19   Johnn Hai, PA-C  famotidine (PEPCID) 20 MG tablet Take 1 tablet (20 mg total) by mouth daily. 09/26/18 01/14/19  Cuthriell, Charline Bills, PA-C    Allergies Onion  Family History  Problem Relation Age of Onset  . Hypertension Mother   . Hypertension Father   . Diabetes Father   . Heart disease Father   . Hypertension Sister   .  Throat cancer Maternal Grandfather   . Stroke Paternal Grandmother   . Diabetes Paternal Grandmother   . Ovarian cancer Neg Hx   . Colon cancer Neg Hx   . Breast cancer Neg Hx     Social History Social History   Tobacco Use  . Smoking status: Current Every Day Smoker    Packs/day: 0.25    Years: 10.00    Pack years: 2.50    Types: Cigarettes  . Smokeless tobacco: Never Used  Substance Use Topics  . Alcohol use: No    Alcohol/week: 0.0 standard drinks  . Drug use: No    Review of Systems Constitutional: No fever/chills HENT: Nasal congestion positive.  Positive sinus pain.  Positive dental pain. Cardiovascular: Denies chest pain. Respiratory: Denies shortness of breath.  Positive for occasional cough. Gastrointestinal: No abdominal pain.  No nausea, no vomiting.  No diarrhea.  No constipation. Genitourinary: Negative for dysuria. Musculoskeletal: Negative for muscle aches. Skin: Negative for rash. Neurological: Negative for headaches, focal weakness or numbness. ____________________________________________   PHYSICAL EXAM:  VITAL SIGNS: ED Triage Vitals [08/05/19 0841]  Enc Vitals Group     BP (!) 147/92     Pulse Rate 99     Resp 18     Temp 98.6 F (37 C)     Temp Source Oral     SpO2 100 %     Weight 220 lb (99.8 kg)     Height 5\' 7"  (1.702 m)     Head Circumference      Peak Flow      Pain Score 9     Pain Loc      Pain Edu?      Excl. in [place]?    Constitutional: Alert and oriented. Well appearing and in no acute distress. Eyes: Conjunctivae are normal.  Head: Atraumatic. Nose: No congestion/rhinnorhea.  Moderate tenderness on percussion of the frontal and maxillary sinuses bilaterally. Mouth/Throat: Mucous membranes are moist.  Oropharynx non-erythematous.  Dental abscesses are noted. Neck: No stridor.   Hematological/Lymphatic/Immunilogical: No cervical lymphadenopathy. Cardiovascular: Normal rate, regular rhythm. Grossly normal heart sounds.  Good  peripheral circulation. Respiratory: Normal respiratory effort.  No retractions. Lungs CTAB. Gastrointestinal: Soft and nontender. No distention.  Musculoskeletal: No lower extremity tenderness nor edema.  No joint effusions. Neurologic:  Normal speech and language. No gross focal neurologic deficits are appreciated. No gait instability. Skin:  Skin is warm, dry and intact.  Psychiatric: Mood and affect are normal. Speech and behavior are normal.  ____________________________________________   LABS (all labs ordered are listed, but only abnormal results are displayed)  Labs Reviewed - No data to display  PROCEDURES  Procedure(s) performed (including Critical Care):  Procedures   ____________________________________________   INITIAL IMPRESSION / ASSESSMENT AND PLAN / ED COURSE  As part of my medical decision making, I reviewed the following data within the electronic MEDICAL RECORD NUMBER Notes from prior ED visits and [place] Controlled Substance Database  34 year old female presents to the ED with history of URI symptoms for  2 weeks and for 1 week increased sinus pressure, dental pain, facial pressure and headache.  Patient has been taking over-the-counter medication with out any relief.  She does have a history of sinus infections in the past.  Patient was discharged with prescription for Augmentin 875 twice daily for 10 days and prednisone taper.  She is encouraged to follow-up with her PCP and drink plenty of fluids.  She was told to return to the emergency department if any severe worsening of her symptoms. ____________________________________________   FINAL CLINICAL IMPRESSION(S) / ED DIAGNOSES  Final diagnoses:  Acute pansinusitis, recurrence not specified     ED Discharge Orders         Ordered    amoxicillin-clavulanate (AUGMENTIN) 875-125 MG tablet  2 times daily     08/05/19 1013    predniSONE (DELTASONE) 10 MG tablet     08/05/19 1013           Note:  This  document was prepared using Dragon voice recognition software and may include unintentional dictation errors.    Johnn Hai, PA-C 08/05/19 1152    Lilia Pro., MD 08/05/19 1225

## 2019-08-10 ENCOUNTER — Encounter: Payer: Self-pay | Admitting: Emergency Medicine

## 2019-08-10 ENCOUNTER — Other Ambulatory Visit: Payer: Self-pay

## 2019-08-10 ENCOUNTER — Emergency Department
Admission: EM | Admit: 2019-08-10 | Discharge: 2019-08-11 | Disposition: A | Discharge status: Home / Self Care | Payer: HRSA Program | Attending: Student in an Organized Health Care Education/Training Program | Admitting: Student in an Organized Health Care Education/Training Program

## 2019-08-10 ENCOUNTER — Emergency Department: Payer: HRSA Program

## 2019-08-10 DIAGNOSIS — F17210 Nicotine dependence, cigarettes, uncomplicated: Secondary | ICD-10-CM | POA: Insufficient documentation

## 2019-08-10 DIAGNOSIS — R112 Nausea with vomiting, unspecified: Secondary | ICD-10-CM | POA: Diagnosis not present

## 2019-08-10 DIAGNOSIS — Z79899 Other long term (current) drug therapy: Secondary | ICD-10-CM | POA: Insufficient documentation

## 2019-08-10 DIAGNOSIS — U071 COVID-19: Secondary | ICD-10-CM | POA: Insufficient documentation

## 2019-08-10 DIAGNOSIS — R079 Chest pain, unspecified: Secondary | ICD-10-CM | POA: Diagnosis not present

## 2019-08-10 DIAGNOSIS — I10 Essential (primary) hypertension: Secondary | ICD-10-CM | POA: Diagnosis not present

## 2019-08-10 DIAGNOSIS — Z20822 Contact with and (suspected) exposure to covid-19: Secondary | ICD-10-CM

## 2019-08-10 DIAGNOSIS — R1110 Vomiting, unspecified: Secondary | ICD-10-CM | POA: Diagnosis present

## 2019-08-10 LAB — COMPREHENSIVE METABOLIC PANEL
ALT: 22 U/L (ref 0–44)
AST: 19 U/L (ref 15–41)
Albumin: 4.5 g/dL (ref 3.5–5.0)
Alkaline Phosphatase: 87 U/L (ref 38–126)
Anion gap: 10 (ref 5–15)
BUN: 11 mg/dL (ref 6–20)
CO2: 27 mmol/L (ref 22–32)
Calcium: 9.2 mg/dL (ref 8.9–10.3)
Chloride: 102 mmol/L (ref 98–111)
Creatinine, Ser: 0.78 mg/dL (ref 0.44–1.00)
GFR calc Af Amer: >60 mL/min (ref >60)
GFR calc non Af Amer: >60 mL/min (ref >60)
Glucose, Bld: 133 mg/dL — ABNORMAL HIGH (ref 70–99)
Potassium: 3.6 mmol/L (ref 3.5–5.1)
Sodium: 139 mmol/L (ref 135–145)
Total Bilirubin: 1 mg/dL (ref 0.3–1.2)
Total Protein: 8.6 g/dL — ABNORMAL HIGH (ref 6.5–8.1)

## 2019-08-10 LAB — URINALYSIS, COMPLETE (UACMP) WITH MICROSCOPIC
Bacteria, UA: NONE SEEN
Bilirubin Urine: NEGATIVE
Glucose, UA: NEGATIVE mg/dL
Hgb urine dipstick: NEGATIVE
Ketones, ur: 5 mg/dL — AB
Leukocytes,Ua: NEGATIVE
Nitrite: NEGATIVE
Protein, ur: >=300 mg/dL — AB
Specific Gravity, Urine: 1.029 (ref 1.005–1.030)
pH: 8 (ref 5.0–8.0)

## 2019-08-10 LAB — CBC
HCT: 45.9 % (ref 36.0–46.0)
Hemoglobin: 15.3 g/dL — ABNORMAL HIGH (ref 12.0–15.0)
MCH: 29.5 pg (ref 26.0–34.0)
MCHC: 33.3 g/dL (ref 30.0–36.0)
MCV: 88.4 fL (ref 80.0–100.0)
Platelets: 385 10*3/uL (ref 150–400)
RBC: 5.19 MIL/uL — ABNORMAL HIGH (ref 3.87–5.11)
RDW: 12.7 % (ref 11.5–15.5)
WBC: 17.6 10*3/uL — ABNORMAL HIGH (ref 4.0–10.5)
nRBC: 0 % (ref 0.0–0.2)

## 2019-08-10 LAB — URINE DRUG SCREEN, QUALITATIVE (ARMC ONLY)
Amphetamines, Ur Screen: NOT DETECTED
Barbiturates, Ur Screen: NOT DETECTED
Benzodiazepine, Ur Scrn: NOT DETECTED
Cannabinoid 50 Ng, Ur ~~LOC~~: POSITIVE — AB
Cocaine Metabolite,Ur ~~LOC~~: POSITIVE — AB
MDMA (Ecstasy)Ur Screen: NOT DETECTED
Methadone Scn, Ur: NOT DETECTED
Opiate, Ur Screen: NOT DETECTED
Phencyclidine (PCP) Ur S: NOT DETECTED
Tricyclic, Ur Screen: NOT DETECTED

## 2019-08-10 LAB — POC SARS CORONAVIRUS 2 AG: SARS Coronavirus 2 Ag: NEGATIVE

## 2019-08-10 LAB — POCT PREGNANCY, URINE: Preg Test, Ur: NEGATIVE

## 2019-08-10 LAB — LIPASE, BLOOD: Lipase: 32 U/L (ref 11–51)

## 2019-08-10 MED ORDER — SODIUM CHLORIDE 0.9% FLUSH: 3.0000 mL | Freq: Once | INTRAVENOUS | Status: DC

## 2019-08-10 MED ORDER — LACTATED RINGERS IV BOLUS
1000.0000 mL | Freq: Once | INTRAVENOUS | Status: AC
  Administered 2019-08-10: 1000 mL via INTRAVENOUS

## 2019-08-10 MED ORDER — IOHEXOL 300 MG/ML  SOLN
100.0000 mL | Freq: Once | INTRAMUSCULAR | Status: AC | PRN
  Administered 2019-08-10: 20:00:00 100 mL via INTRAVENOUS
  Filled 2019-08-10: qty 100

## 2019-08-10 MED ORDER — PROMETHAZINE HCL 12.5 MG PO TABS: 12.5000 mg | ORAL_TABLET | Freq: Four times a day (QID) | ORAL | 0 refills | Status: AC | PRN

## 2019-08-10 MED ORDER — PANTOPRAZOLE SODIUM 40 MG IV SOLR
40.0000 mg | Freq: Once | INTRAVENOUS | Status: AC
  Administered 2019-08-10: 40 mg via INTRAVENOUS
  Filled 2019-08-10: qty 40

## 2019-08-10 MED ORDER — ONDANSETRON HCL 4 MG/2ML IJ SOLN
4.0000 mg | Freq: Once | INTRAMUSCULAR | Status: AC
  Administered 2019-08-10: 4 mg via INTRAVENOUS
  Filled 2019-08-10: qty 2

## 2019-08-10 MED ORDER — PROMETHAZINE HCL 25 MG/ML IJ SOLN
12.5000 mg | Freq: Once | INTRAMUSCULAR | Status: AC
  Administered 2019-08-10: 19:00:00 12.5 mg via INTRAVENOUS
  Filled 2019-08-10: qty 1

## 2019-08-10 MED ORDER — HALOPERIDOL LACTATE 5 MG/ML IJ SOLN
5.0000 mg | Freq: Once | INTRAMUSCULAR | Status: AC
  Administered 2019-08-10: 5 mg via INTRAVENOUS
  Filled 2019-08-10: qty 1

## 2019-08-10 MED ORDER — KETOROLAC TROMETHAMINE 30 MG/ML IJ SOLN
15.0000 mg | Freq: Once | INTRAMUSCULAR | Status: AC
  Administered 2019-08-10: 15 mg via INTRAVENOUS
  Filled 2019-08-10: qty 1

## 2019-08-10 MED ORDER — SODIUM CHLORIDE 0.9 % IV BOLUS
1000.0000 mL | Freq: Once | INTRAVENOUS | Status: AC
  Administered 2019-08-10: 1000 mL via INTRAVENOUS

## 2019-08-10 NOTE — ED Provider Notes (Signed)
[facility] Emergency Department Provider Note   ____________________________________________   First MD Initiated Contact with Patient 08/10/19 1623     (approximate)  I have reviewed the triage vital signs and the nursing notes.   HISTORY  Chief Complaint Emesis and COVID exposure    HPI Jasmine Mason is a 34 y.o. female with past medical history of hypertension and GERD who presents to the ED complaining of vomiting, diarrhea, cough, and chest pain.  Patient reports that she started feeling bad 2 to 3 days ago with cough and congestion, since then has noted fevers as high as 102.0 last night.  She has been having nausea with occasional episodes of vomiting as well as diarrhea and subsequently developed diffuse abdominal cramping as well as cramping through much of her body.  She reports recently being exposed to COVID-19 when she went to dinner with her sister's friend, who had tested positive.  She denies any dysuria, hematuria, or flank pain.        Past Medical History:  Diagnosis Date  . Back pain   . Back pain   . GERD (gastroesophageal reflux disease)   . Hypertension   . Mesenteric adenitis   . Obesity   . PCOS (polycystic ovarian syndrome)   . Tobacco abuse     Patient Active Problem List   Diagnosis Date Noted  . 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 Encounter for smoking cessation counseling 04/18/2017  . Essential hypertension 04/18/2017  . Epigastric abdominal pain   . Acute gastritis 09/20/2016  . Nausea with vomiting   . Hiatal hernia   . Uncontrollable vomiting 03/10/2016  . Abdominal pain, epigastric   . GERD (gastroesophageal reflux disease) 01/24/2015  . Hypokalemia 01/24/2015  . Abdominal pain 01/24/2015  . Leukocytosis 01/24/2015  . Chest pain 01/24/2015  . Cough 01/24/2015  . Pain of left calf 01/24/2015  . Elevated troponin 01/24/2015  . Obesity   . Mesenteric adenitis   . Tobacco abuse     Past Surgical  History:  Procedure Laterality Date  . CARPAL TUNNEL RELEASE Left ~ 2013  . COLONOSCOPY WITH PROPOFOL N/A 08/08/2017   Procedure: COLONOSCOPY WITH PROPOFOL;  Surgeon: Jonathon Bellows, MD;  Location: [facility] ENDOSCOPY;  Service: Gastroenterology;  Laterality: N/A;  . ESOPHAGOGASTRODUODENOSCOPY N/A 01/25/2015   Procedure: ESOPHAGOGASTRODUODENOSCOPY (EGD);  Surgeon: Irene Shipper, MD;  Location: [facility] ENDOSCOPY;  Service: Endoscopy;  Laterality: N/A;  . ESOPHAGOGASTRODUODENOSCOPY (EGD) WITH PROPOFOL N/A 03/13/2016   Procedure: ESOPHAGOGASTRODUODENOSCOPY (EGD) WITH PROPOFOL;  Surgeon: Lucilla Lame, MD;  Location: [facility] ENDOSCOPY;  Service: Endoscopy;  Laterality: N/A;  . ESOPHAGOGASTRODUODENOSCOPY (EGD) WITH PROPOFOL N/A 07/05/2017   Procedure: ESOPHAGOGASTRODUODENOSCOPY (EGD) WITH PROPOFOL;  Surgeon: Jonathon Bellows, MD;  Location: [facility] ENDOSCOPY;  Service: Gastroenterology;  Laterality: N/A;  . FRACTURE SURGERY    . ORIF ANKLE FRACTURE Left 1990's    Prior to Admission medications   Medication Sig Start Date End Date Taking? Authorizing Provider  alum & mag hydroxide-simeth (MAALOX MAX) 400-400-40 MG/5ML suspension Take 5 mLs by mouth every 6 (six) hours as needed for indigestion.    [provider]  amoxicillin-clavulanate (AUGMENTIN) 875-125 MG tablet Take 1 tablet by mouth 2 (two) times daily for 7 days. 08/05/19 08/12/19  Johnn Hai, PA-C  hydrochlorothiazide (HYDRODIURIL) 12.5 MG tablet Take 1 tablet (12.5 mg total) by mouth daily. 06/22/19   Karamalegos, Devonne Doughty, DO  promethazine (PHENERGAN) 12.5 MG tablet Take 1 tablet (12.5 mg total) by mouth every 6 (six)  hours as needed for nausea or vomiting. 08/10/19   Blake Divine, MD  famotidine (PEPCID) 20 MG tablet Take 1 tablet (20 mg total) by mouth daily. 09/26/18 01/14/19  Cuthriell, Charline Bills, PA-C    Allergies Onion  Family History  Problem Relation Age of Onset  . Hypertension Mother   . Hypertension Father   . Diabetes Father   .  Heart disease Father   . Hypertension Sister   . Throat cancer Maternal Grandfather   . Stroke Paternal Grandmother   . Diabetes Paternal Grandmother   . Ovarian cancer Neg Hx   . Colon cancer Neg Hx   . Breast cancer Neg Hx     Social History Social History   Tobacco Use  . Smoking status: Current Every Day Smoker    Packs/day: 0.25    Years: 10.00    Pack years: 2.50    Types: Cigarettes  . Smokeless tobacco: Never Used  Substance Use Topics  . Alcohol use: No    Alcohol/week: 0.0 standard drinks  . Drug use: No    Review of Systems  Constitutional: Positive for fever/chills Eyes: No visual changes. ENT: No sore throat. Cardiovascular: Positive for chest pain. Respiratory: Denies shortness of breath.  Positive for cough. Gastrointestinal: Positive for abdominal pain.  Positive for nausea and vomiting.  Positive for diarrhea.  No constipation. Genitourinary: Negative for dysuria. Musculoskeletal: Negative for back pain. Skin: Negative for rash. Neurological: Negative for headaches, focal weakness or numbness.  ____________________________________________   PHYSICAL EXAM:  VITAL SIGNS: ED Triage Vitals  Enc Vitals Group     BP 08/10/19 1521 (!) 152/100     Pulse Rate 08/10/19 1521 84     Resp 08/10/19 1521 16     Temp 08/10/19 1521 98.7 F (37.1 C)     Temp Source 08/10/19 1521 Oral     SpO2 08/10/19 1521 98 %     Weight 08/10/19 1522 220 lb (99.8 kg)     Height 08/10/19 1522 5\' 7"  (1.702 m)     Head Circumference --      Peak Flow --      Pain Score 08/10/19 1525 8     Pain Loc --      Pain Edu? --      Excl. in [place]? --     Constitutional: Alert and oriented. Eyes: Conjunctivae are normal. Head: Atraumatic. Nose: No congestion/rhinnorhea. Mouth/Throat: Mucous membranes are moist. Neck: Normal ROM Cardiovascular: Normal rate, regular rhythm. Grossly normal heart sounds. Respiratory: Normal respiratory effort.  No retractions. Lungs  CTAB. Gastrointestinal: Soft and nontender. No distention. Genitourinary: deferred Musculoskeletal: No lower extremity tenderness nor edema. Neurologic:  Normal speech and language. No gross focal neurologic deficits are appreciated. Skin:  Skin is warm, dry and intact. No rash noted. Psychiatric: Mood and affect are normal. Speech and behavior are normal.  ____________________________________________   LABS (all labs ordered are listed, but only abnormal results are displayed)  Labs Reviewed  COMPREHENSIVE METABOLIC PANEL - Abnormal; Notable for the following components:      Result Value   Glucose, Bld 133 (*)    Total Protein 8.6 (*)    All other components within normal limits  CBC - Abnormal; Notable for the following components:   WBC 17.6 (*)    RBC 5.19 (*)    Hemoglobin 15.3 (*)    All other components within normal limits  URINALYSIS, COMPLETE (UACMP) WITH MICROSCOPIC - Abnormal; Notable for the following components:  Color, Urine YELLOW (*)    APPearance CLOUDY (*)    Ketones, ur 5 (*)    Protein, ur >=300 (*)    All other components within normal limits  SARS CORONAVIRUS 2 (TAT 6-24 HRS)  LIPASE, BLOOD  POC URINE PREG, ED  POCT PREGNANCY, URINE  POC SARS CORONAVIRUS 2 AG -  ED  POC SARS CORONAVIRUS 2 AG   ____________________________________________  EKG  ED ECG REPORT I, Blake Divine, the attending physician, personally viewed and interpreted this ECG.   Date: 08/10/2019  EKG Time: 17:20  Rate: 71  Rhythm: normal sinus rhythm  Axis: Normal  Intervals:none  ST&T Change: None  PROCEDURES  Procedure(s) performed (including Critical Care):  Procedures   ____________________________________________   INITIAL IMPRESSION / ASSESSMENT AND PLAN / ED COURSE       34 year old female presents to the ED with a few days of cough, chest pain, congestion, vomiting, and diarrhea after a recent COVID-19 exposure.  Her vital signs are reassuring and not  consistent with sepsis.  Pregnancy testing is negative, LFTs and lipase are unremarkable.  She has a benign and nonfocal abdominal exam.  Given her chest pain, will check EKG but doubt ACS as infectious etiology seems most likely.  We will also check chest x-ray for evidence of pneumonia, perform COVID-19 testing.  She is also likely dehydrated as multiple blood lines elevated consistent with hemoconcentration.  Patient continues to feel nauseous with episode of vomiting following dose of zofran. Will order phenergan and PO challenge afterwards. Patient turned over to Dr. Quentin Cornwall pending reevaluation.      ____________________________________________   FINAL CLINICAL IMPRESSION(S) / ED DIAGNOSES  Final diagnoses:  Suspected COVID-19 virus infection  Non-intractable vomiting with nausea, unspecified vomiting type  Chest pain, unspecified type     ED Discharge Orders         Ordered    promethazine (PHENERGAN) 12.5 MG tablet  Every 6 hours PRN     08/10/19 1836           Note:  This document was prepared using Dragon voice recognition software and may include unintentional dictation errors.   Blake Divine, MD 08/10/19 2011

## 2019-08-10 NOTE — ED Notes (Addendum)
See triage note  Presents with n/v/d since yesterday  States she states at [facility]    Thinks she has been exposed to [place] fever yesterday was 101 but is afebrile on arrival

## 2019-08-10 NOTE — ED Notes (Signed)
Called [facility] to come get pt.

## 2019-08-10 NOTE — ED Triage Notes (Signed)
[facility] [address] , pt c/o N/V with recent exposure to covid.Marland Kitchen CBG 134, zofran 4mg  IV 20g Lhand

## 2019-08-10 NOTE — ED Triage Notes (Signed)
Nausea and vomiting since yesterday, state has been exposed to [place].

## 2019-08-10 NOTE — ED Provider Notes (Addendum)
Patient received in signout from Dr. Charna Archer.  Patient having persistent vomiting and on my exam did have some tenderness therefore CT imaging was ordered to evaluate for symptoms with leukocytosis.  No evidence of acute intra-abdominal process specifically no appendicitis or cholecystitis.  Suspect a component of gastritis.  Patient also with a history of polysubstance abuse on review of medical records.  Is also been seen multiple times for chronic vomiting.  When I went to discuss the results of her imaging and blood work patient states that that is "unacceptable.  No one has done anything for me."  States that she is unwilling to go she feels like she is still nauseated.  Will give additional bolus of IV fluid as well as some IV Haldol as that might help her persistent vomiting.  She does not feel like she is getting any improvement from Phenergan.    Merlyn Lot, MD 08/10/19 2036

## 2019-08-10 NOTE — ED Notes (Signed)
Quentin Cornwall MD at bedside

## 2019-08-10 NOTE — ED Notes (Signed)
Pt noted to be vomiting at this time.

## 2019-08-10 NOTE — ED Notes (Signed)
PT resting with snoring respirations

## 2019-08-10 NOTE — ED Notes (Signed)
Pt to CT at this time.

## 2019-08-10 NOTE — ED Notes (Signed)
Pt ambulatory to this restroom at this time, steady gait, NAD noted

## 2019-08-10 NOTE — ED Notes (Signed)
Pt sleeping comfortably at this time.

## 2019-08-11 ENCOUNTER — Telehealth: Payer: Self-pay | Admitting: Emergency Medicine

## 2019-08-11 LAB — SARS CORONAVIRUS 2 (TAT 6-24 HRS): SARS Coronavirus 2: POSITIVE — AB

## 2019-08-11 NOTE — Telephone Encounter (Signed)
Called to assure she is aware of positive covid.  Left a message.

## 2019-09-14 ENCOUNTER — Emergency Department: Payer: Self-pay

## 2019-09-14 ENCOUNTER — Emergency Department
Admission: EM | Admit: 2019-09-14 | Discharge: 2019-09-14 | Disposition: A | Discharge status: Home / Self Care | Payer: Self-pay | Attending: Emergency Medicine | Admitting: Emergency Medicine

## 2019-09-14 ENCOUNTER — Encounter: Payer: Self-pay | Admitting: Emergency Medicine

## 2019-09-14 ENCOUNTER — Other Ambulatory Visit: Payer: Self-pay

## 2019-09-14 DIAGNOSIS — K920 Hematemesis: Secondary | ICD-10-CM | POA: Insufficient documentation

## 2019-09-14 DIAGNOSIS — K2970 Gastritis, unspecified, without bleeding: Secondary | ICD-10-CM

## 2019-09-14 DIAGNOSIS — I10 Essential (primary) hypertension: Secondary | ICD-10-CM | POA: Insufficient documentation

## 2019-09-14 DIAGNOSIS — K2971 Gastritis, unspecified, with bleeding: Secondary | ICD-10-CM | POA: Insufficient documentation

## 2019-09-14 DIAGNOSIS — R1013 Epigastric pain: Secondary | ICD-10-CM | POA: Insufficient documentation

## 2019-09-14 DIAGNOSIS — F17210 Nicotine dependence, cigarettes, uncomplicated: Secondary | ICD-10-CM | POA: Insufficient documentation

## 2019-09-14 DIAGNOSIS — K219 Gastro-esophageal reflux disease without esophagitis: Secondary | ICD-10-CM | POA: Insufficient documentation

## 2019-09-14 DIAGNOSIS — Z79899 Other long term (current) drug therapy: Secondary | ICD-10-CM | POA: Insufficient documentation

## 2019-09-14 HISTORY — DX: Gastritis, unspecified, without bleeding: K29.70

## 2019-09-14 LAB — BASIC METABOLIC PANEL
Anion gap: 13 (ref 5–15)
BUN: 11 mg/dL (ref 6–20)
CO2: 24 mmol/L (ref 22–32)
Calcium: 9.1 mg/dL (ref 8.9–10.3)
Chloride: 101 mmol/L (ref 98–111)
Creatinine, Ser: 0.71 mg/dL (ref 0.44–1.00)
GFR calc Af Amer: >60 mL/min (ref >60)
GFR calc non Af Amer: >60 mL/min (ref >60)
Glucose, Bld: 141 mg/dL — ABNORMAL HIGH (ref 70–99)
Potassium: 3.3 mmol/L — ABNORMAL LOW (ref 3.5–5.1)
Sodium: 138 mmol/L (ref 135–145)

## 2019-09-14 LAB — CBC
HCT: 47.8 % — ABNORMAL HIGH (ref 36.0–46.0)
Hemoglobin: 16.2 g/dL — ABNORMAL HIGH (ref 12.0–15.0)
MCH: 29.6 pg (ref 26.0–34.0)
MCHC: 33.9 g/dL (ref 30.0–36.0)
MCV: 87.4 fL (ref 80.0–100.0)
Platelets: 376 10*3/uL (ref 150–400)
RBC: 5.47 MIL/uL — ABNORMAL HIGH (ref 3.87–5.11)
RDW: 12.6 % (ref 11.5–15.5)
WBC: 18.3 10*3/uL — ABNORMAL HIGH (ref 4.0–10.5)
nRBC: 0 % (ref 0.0–0.2)

## 2019-09-14 LAB — TROPONIN I (HIGH SENSITIVITY): Troponin I (High Sensitivity): 4 ng/L (ref <18)

## 2019-09-14 MED ORDER — ONDANSETRON 4 MG PO TBDP
4.0000 mg | ORAL_TABLET | Freq: Once | ORAL | Status: AC
  Administered 2019-09-14: 4 mg via ORAL
  Filled 2019-09-14: qty 1

## 2019-09-14 MED ORDER — PANTOPRAZOLE SODIUM 20 MG PO TBEC: 20.0000 mg | DELAYED_RELEASE_TABLET | Freq: Every day | ORAL | 1 refills | Status: AC

## 2019-09-14 MED ORDER — LIDOCAINE VISCOUS HCL 2 % MT SOLN
15.0000 mL | Freq: Once | OROMUCOSAL | Status: AC
  Administered 2019-09-14: 15 mL via ORAL
  Filled 2019-09-14: qty 15

## 2019-09-14 MED ORDER — ALUM & MAG HYDROXIDE-SIMETH 200-200-20 MG/5ML PO SUSP
30.0000 mL | Freq: Once | ORAL | Status: AC
  Administered 2019-09-14: 30 mL via ORAL
  Filled 2019-09-14: qty 30

## 2019-09-14 NOTE — Discharge Instructions (Addendum)
Please seek medical attention for any high fevers, chest pain, shortness of breath, change in behavior, persistent vomiting, bloody stool or any other new or concerning symptoms.  

## 2019-09-14 NOTE — ED Notes (Signed)
Pt reports history of gastritis and thinks she is having an episode which started yesterday. Reports "burning in chest" and that she "filled up a small trash can with vomit." Is requesting a "GI cocktail." Is laying in bed calmly.

## 2019-09-14 NOTE — ED Triage Notes (Signed)
Pt states last pm around 10pm her stomach and chest started hurting and she started vomiting. Pt reports painis sharpe and bring in nature and to her center chest, non radiating. Pt reports also feels SOB with the pain. Pt states hx of gastritis and thinks that may be the cause.

## 2019-09-14 NOTE — ED Provider Notes (Signed)
[facility] Emergency Department Provider Note  ____________________________________________   I have reviewed the triage vital signs and the nursing notes.   HISTORY  Chief Complaint Gastritis  History limited by: Not Limited   HPI Jasmine Mason is a 34 y.o. female who presents to the emergency department today because of concern for gastritis.  The patient states she has a history of significant gastritis and that this episode feels the same.  She is complaining of abdominal pain nausea and vomiting that started at roughly 10 PM last night.  She denies eating anything particularly hot or spicy.  She thinks it might have been triggered by soda she drank.  Patient states that she is also developed small amount of blood from her vomiting which she thinks could be due to a small tear in her esophagus.  She states she has run out of her Protonix that she normally did takes for her gastritis.  She denies any fevers.   Records reviewed. Per medical record review patient has a history of gastritis.   Past Medical History:  Diagnosis Date  . Back pain   . Back pain   . Gastritis   . GERD (gastroesophageal reflux disease)   . Hypertension   . Mesenteric adenitis   . Obesity   . PCOS (polycystic ovarian syndrome)   . Tobacco abuse     Patient Active Problem List   Diagnosis Date Noted  . 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 Encounter for smoking cessation counseling 04/18/2017  . Essential hypertension 04/18/2017  . Epigastric abdominal pain   . Acute gastritis 09/20/2016  . Nausea with vomiting   . Hiatal hernia   . Uncontrollable vomiting 03/10/2016  . Abdominal pain, epigastric   . GERD (gastroesophageal reflux disease) 01/24/2015  . Hypokalemia 01/24/2015  . Abdominal pain 01/24/2015  . Leukocytosis 01/24/2015  . Chest pain 01/24/2015  . Cough 01/24/2015  . Pain of left calf 01/24/2015  . Elevated troponin 01/24/2015  . Obesity   . Mesenteric  adenitis   . Tobacco abuse     Past Surgical History:  Procedure Laterality Date  . CARPAL TUNNEL RELEASE Left ~ 2013  . COLONOSCOPY WITH PROPOFOL N/A 08/08/2017   Procedure: COLONOSCOPY WITH PROPOFOL;  Surgeon: Jonathon Bellows, MD;  Location: [facility] ENDOSCOPY;  Service: Gastroenterology;  Laterality: N/A;  . ESOPHAGOGASTRODUODENOSCOPY N/A 01/25/2015   Procedure: ESOPHAGOGASTRODUODENOSCOPY (EGD);  Surgeon: Irene Shipper, MD;  Location: [facility] ENDOSCOPY;  Service: Endoscopy;  Laterality: N/A;  . ESOPHAGOGASTRODUODENOSCOPY (EGD) WITH PROPOFOL N/A 03/13/2016   Procedure: ESOPHAGOGASTRODUODENOSCOPY (EGD) WITH PROPOFOL;  Surgeon: Lucilla Lame, MD;  Location: [facility] ENDOSCOPY;  Service: Endoscopy;  Laterality: N/A;  . ESOPHAGOGASTRODUODENOSCOPY (EGD) WITH PROPOFOL N/A 07/05/2017   Procedure: ESOPHAGOGASTRODUODENOSCOPY (EGD) WITH PROPOFOL;  Surgeon: Jonathon Bellows, MD;  Location: [facility] ENDOSCOPY;  Service: Gastroenterology;  Laterality: N/A;  . FRACTURE SURGERY    . ORIF ANKLE FRACTURE Left 1990's    Prior to Admission medications   Medication Sig Start Date End Date Taking? Authorizing Provider  alum & mag hydroxide-simeth (MAALOX MAX) 400-400-40 MG/5ML suspension Take 5 mLs by mouth every 6 (six) hours as needed for indigestion.    [provider]  hydrochlorothiazide (HYDRODIURIL) 12.5 MG tablet Take 1 tablet (12.5 mg total) by mouth daily. 06/22/19   Karamalegos, Devonne Doughty, DO  promethazine (PHENERGAN) 12.5 MG tablet Take 1 tablet (12.5 mg total) by mouth every 6 (six) hours as needed for nausea or vomiting. 08/10/19   Blake Divine, MD  famotidine (  PEPCID) 20 MG tablet Take 1 tablet (20 mg total) by mouth daily. 09/26/18 01/14/19  Cuthriell, Charline Bills, PA-C    Allergies Onion  Family History  Problem Relation Age of Onset  . Hypertension Mother   . Hypertension Father   . Diabetes Father   . Heart disease Father   . Hypertension Sister   . Throat cancer Maternal Grandfather   . Stroke  Paternal Grandmother   . Diabetes Paternal Grandmother   . Ovarian cancer Neg Hx   . Colon cancer Neg Hx   . Breast cancer Neg Hx     Social History Social History   Tobacco Use  . Smoking status: Current Every Day Smoker    Packs/day: 0.25    Years: 10.00    Pack years: 2.50    Types: Cigarettes  . Smokeless tobacco: Never Used  Substance Use Topics  . Alcohol use: No    Alcohol/week: 0.0 standard drinks  . Drug use: No    Review of Systems Constitutional: No fever/chills Eyes: No visual changes. ENT: No sore throat. Cardiovascular: Denies chest pain. Respiratory: Denies shortness of breath. Gastrointestinal: Positive for abdominal pain, nausea and vomiting.   Genitourinary: Negative for dysuria. Musculoskeletal: Negative for back pain. Skin: Negative for rash. Neurological: Negative for headaches, focal weakness or numbness.  ____________________________________________   PHYSICAL EXAM:  VITAL SIGNS: ED Triage Vitals  Enc Vitals Group     BP 09/14/19 1254 (!) 176/99     Pulse Rate 09/14/19 1254 93     Resp 09/14/19 1254 20     Temp 09/14/19 1254 98.8 F (37.1 C)     Temp Source 09/14/19 1254 Oral     SpO2 09/14/19 1254 100 %     Weight 09/14/19 1254 220 lb (99.8 kg)     Height 09/14/19 1254 5\' 6"  (1.676 m)     Head Circumference --      Peak Flow --      Pain Score 09/14/19 1257 8   Constitutional: Alert and oriented.  Eyes: Conjunctivae are normal.  ENT      Head: Normocephalic and atraumatic.      Nose: No congestion/rhinnorhea.      Mouth/Throat: Mucous membranes are moist.      Neck: No stridor. Hematological/Lymphatic/Immunilogical: No cervical lymphadenopathy. Cardiovascular: Normal rate, regular rhythm.  No murmurs, rubs, or gallops.  Respiratory: Normal respiratory effort without tachypnea nor retractions. Breath sounds are clear and equal bilaterally. No wheezes/rales/rhonchi. Gastrointestinal: Soft and minimally tender in the epigastric  and luq. Genitourinary: Deferred Musculoskeletal: Normal range of motion in all extremities. No lower extremity edema. Neurologic:  Normal speech and language. No gross focal neurologic deficits are appreciated.  Skin:  Skin is warm, dry and intact. No rash noted. Psychiatric: Mood and affect are normal. Speech and behavior are normal. Patient exhibits appropriate insight and judgment.  ____________________________________________    LABS (pertinent positives/negatives)  Trop hs 4 BMP wnl except k 3.3, glu 141 CBC wbc 18.3, hgb 16.2, plt 376  ____________________________________________   EKG  I, Nance Pear, attending physician, personally viewed and interpreted this EKG  EKG Time: 1251 Rate: 94 Rhythm: normal sinus rhythm Axis: normal Intervals: qtc 460 QRS: narrow ST changes: no st elevation Impression: abnormal ekg   ____________________________________________    RADIOLOGY  CXR No acute findings  ____________________________________________   PROCEDURES  Procedures  ____________________________________________   INITIAL IMPRESSION / ASSESSMENT AND PLAN / ED COURSE  Pertinent labs & imaging results that were available  during my care of the patient were reviewed by me and considered in my medical decision making (see chart for details).   Patient presented to the emergency department today because of concerns for gastritis with nausea and vomiting.  Patient did have some mild tenderness to palpation in the epigastric and left upper quadrant.  Blood work did show a leukocytosis however it appears patient typically does present with a leukocytosis.  She did gain significant relief after GI cocktail and Zofran.  At this point I do think it is likely gastritis.  Do not think patient would benefit significantly from emergent imaging.  Patient felt comfortable with discharge home.  Will send prescription for  Protonix. ____________________________________________   FINAL CLINICAL IMPRESSION(S) / ED DIAGNOSES  Final diagnoses:  Gastritis, presence of bleeding unspecified, unspecified chronicity, unspecified gastritis type     Note: This dictation was prepared with Dragon dictation. Any transcriptional errors that result from this process are unintentional     Nance Pear, MD 09/14/19 1623

## 2019-09-14 NOTE — ED Notes (Signed)
AAOx3.  Skin warm and dry.  NAD 

## 2019-09-14 NOTE — ED Notes (Signed)
Verbal from Marcus to hold on repeat Trop until he has seen pt at bedside.

## 2019-09-15 ENCOUNTER — Emergency Department
Admission: EM | Admit: 2019-09-15 | Discharge: 2019-09-15 | Disposition: A | Discharge status: Home / Self Care | Payer: Self-pay | Attending: Emergency Medicine | Admitting: Emergency Medicine

## 2019-09-15 ENCOUNTER — Emergency Department: Payer: Self-pay

## 2019-09-15 ENCOUNTER — Other Ambulatory Visit: Payer: Self-pay

## 2019-09-15 ENCOUNTER — Encounter: Payer: Self-pay | Admitting: Emergency Medicine

## 2019-09-15 DIAGNOSIS — R1010 Upper abdominal pain, unspecified: Secondary | ICD-10-CM | POA: Insufficient documentation

## 2019-09-15 DIAGNOSIS — I10 Essential (primary) hypertension: Secondary | ICD-10-CM | POA: Insufficient documentation

## 2019-09-15 DIAGNOSIS — F17210 Nicotine dependence, cigarettes, uncomplicated: Secondary | ICD-10-CM | POA: Insufficient documentation

## 2019-09-15 DIAGNOSIS — R112 Nausea with vomiting, unspecified: Secondary | ICD-10-CM | POA: Insufficient documentation

## 2019-09-15 LAB — CBC WITH DIFFERENTIAL/PLATELET
Abs Immature Granulocytes: 0.06 10*3/uL (ref 0.00–0.07)
Basophils Absolute: 0 10*3/uL (ref 0.0–0.1)
Basophils Relative: 0 %
Eosinophils Absolute: 0 10*3/uL (ref 0.0–0.5)
Eosinophils Relative: 0 %
HCT: 48.2 % — ABNORMAL HIGH (ref 36.0–46.0)
Hemoglobin: 16.2 g/dL — ABNORMAL HIGH (ref 12.0–15.0)
Immature Granulocytes: 0 %
Lymphocytes Relative: 15 %
Lymphs Abs: 2.7 10*3/uL (ref 0.7–4.0)
MCH: 29.7 pg (ref 26.0–34.0)
MCHC: 33.6 g/dL (ref 30.0–36.0)
MCV: 88.3 fL (ref 80.0–100.0)
Monocytes Absolute: 1.4 10*3/uL — ABNORMAL HIGH (ref 0.1–1.0)
Monocytes Relative: 8 %
Neutro Abs: 13.5 10*3/uL — ABNORMAL HIGH (ref 1.7–7.7)
Neutrophils Relative %: 77 %
Platelets: 374 10*3/uL (ref 150–400)
RBC: 5.46 MIL/uL — ABNORMAL HIGH (ref 3.87–5.11)
RDW: 13.1 % (ref 11.5–15.5)
WBC: 17.7 10*3/uL — ABNORMAL HIGH (ref 4.0–10.5)
nRBC: 0 % (ref 0.0–0.2)

## 2019-09-15 LAB — COMPREHENSIVE METABOLIC PANEL
ALT: 20 U/L (ref 0–44)
AST: 18 U/L (ref 15–41)
Albumin: 4.6 g/dL (ref 3.5–5.0)
Alkaline Phosphatase: 85 U/L (ref 38–126)
Anion gap: 14 (ref 5–15)
BUN: 13 mg/dL (ref 6–20)
CO2: 25 mmol/L (ref 22–32)
Calcium: 8.9 mg/dL (ref 8.9–10.3)
Chloride: 99 mmol/L (ref 98–111)
Creatinine, Ser: 0.9 mg/dL (ref 0.44–1.00)
GFR calc Af Amer: >60 mL/min (ref >60)
GFR calc non Af Amer: >60 mL/min (ref >60)
Glucose, Bld: 133 mg/dL — ABNORMAL HIGH (ref 70–99)
Potassium: 2.9 mmol/L — ABNORMAL LOW (ref 3.5–5.1)
Sodium: 138 mmol/L (ref 135–145)
Total Bilirubin: 1.3 mg/dL — ABNORMAL HIGH (ref 0.3–1.2)
Total Protein: 8.4 g/dL — ABNORMAL HIGH (ref 6.5–8.1)

## 2019-09-15 LAB — LIPASE, BLOOD: Lipase: 27 U/L (ref 11–51)

## 2019-09-15 MED ORDER — HYDROCHLOROTHIAZIDE 12.5 MG PO TABS: 12.5000 mg | ORAL_TABLET | Freq: Every day | ORAL | 0 refills | Status: DC

## 2019-09-15 MED ORDER — METOCLOPRAMIDE HCL 10 MG PO TABS: 10.0000 mg | ORAL_TABLET | Freq: Three times a day (TID) | ORAL | 1 refills | Status: DC | PRN

## 2019-09-15 MED ORDER — ALUM & MAG HYDROXIDE-SIMETH 200-200-20 MG/5ML PO SUSP
30.0000 mL | Freq: Once | ORAL | Status: AC
  Administered 2019-09-15: 30 mL via ORAL
  Filled 2019-09-15: qty 30

## 2019-09-15 MED ORDER — METOCLOPRAMIDE HCL 5 MG/ML IJ SOLN
10.0000 mg | Freq: Once | INTRAMUSCULAR | Status: AC
  Administered 2019-09-15: 08:00:00 10 mg via INTRAVENOUS
  Filled 2019-09-15: qty 2

## 2019-09-15 MED ORDER — LIDOCAINE VISCOUS HCL 2 % MT SOLN
15.0000 mL | Freq: Once | OROMUCOSAL | Status: AC
  Administered 2019-09-15: 15 mL via ORAL
  Filled 2019-09-15: qty 15

## 2019-09-15 MED ORDER — HYDROCHLOROTHIAZIDE 12.5 MG PO TABS: 12.5000 mg | ORAL_TABLET | Freq: Every day | ORAL | 0 refills | Status: AC

## 2019-09-15 MED ORDER — ALUM & MAG HYDROXIDE-SIMETH 400-400-40 MG/5ML PO SUSP: 5.0000 mL | Freq: Four times a day (QID) | ORAL | 1 refills | Status: DC | PRN

## 2019-09-15 MED ORDER — MORPHINE SULFATE (PF) 4 MG/ML IV SOLN
4.0000 mg | Freq: Once | INTRAVENOUS | Status: AC
  Administered 2019-09-15: 08:00:00 4 mg via INTRAVENOUS
  Filled 2019-09-15: qty 1

## 2019-09-15 MED ORDER — ALUM & MAG HYDROXIDE-SIMETH 400-400-40 MG/5ML PO SUSP: 5.0000 mL | Freq: Four times a day (QID) | ORAL | 1 refills | Status: AC | PRN

## 2019-09-15 NOTE — ED Triage Notes (Signed)
Presents with abd pain and n/v  States she was seen yesterday for same  Describes pain as "burning type"  states discomfort in in mid chet

## 2019-09-15 NOTE — ED Provider Notes (Signed)
Jasmine Mason Provider Note       Time seen: ----------------------------------------- 7:34 AM on 09/15/2019 -----------------------------------------   I have reviewed the triage vital signs and the nursing notes. HISTORY   Chief Complaint Abdominal Pain and Emesis    HPI Jasmine Mason is a 34 y.o. female with a history of gastritis, GERD, hypertension, mesenteric adenitis, polycystic ovaries, who presents for abdominal pain with nausea and vomiting.  Patient states she was seen yesterday for same.  She describes the pain as a burning type and states she has discomfort in the mid chest.  Pain is 7 on a 10.  She denies fevers, chills or other complaints.    Past Medical History:  Diagnosis Date  . Back pain   . Back pain   . Gastritis   . GERD (gastroesophageal reflux disease)   . Hypertension   . Mesenteric adenitis   . Obesity   . PCOS (polycystic ovarian syndrome)   . Tobacco abuse     Patient Active Problem List   Diagnosis Date Noted  . 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 Encounter for smoking cessation counseling 04/18/2017  . Essential hypertension 04/18/2017  . Epigastric abdominal pain   . Acute gastritis 09/20/2016  . Nausea with vomiting   . Hiatal hernia   . Uncontrollable vomiting 03/10/2016  . Abdominal pain, epigastric   . GERD (gastroesophageal reflux disease) 01/24/2015  . Hypokalemia 01/24/2015  . Abdominal pain 01/24/2015  . Leukocytosis 01/24/2015  . Chest pain 01/24/2015  . Cough 01/24/2015  . Pain of left calf 01/24/2015  . Elevated troponin 01/24/2015  . Obesity   . Mesenteric adenitis   . Tobacco abuse     Past Surgical History:  Procedure Laterality Date  . CARPAL TUNNEL RELEASE Left ~ 2013  . COLONOSCOPY WITH PROPOFOL N/A 08/08/2017   Procedure: COLONOSCOPY WITH PROPOFOL;  Surgeon: Jonathon Bellows, MD;  Location: [facility] ENDOSCOPY;  Service: Gastroenterology;  Laterality: N/A;  .  ESOPHAGOGASTRODUODENOSCOPY N/A 01/25/2015   Procedure: ESOPHAGOGASTRODUODENOSCOPY (EGD);  Surgeon: Irene Shipper, MD;  Location: [facility] ENDOSCOPY;  Service: Endoscopy;  Laterality: N/A;  . ESOPHAGOGASTRODUODENOSCOPY (EGD) WITH PROPOFOL N/A 03/13/2016   Procedure: ESOPHAGOGASTRODUODENOSCOPY (EGD) WITH PROPOFOL;  Surgeon: Lucilla Lame, MD;  Location: [facility] ENDOSCOPY;  Service: Endoscopy;  Laterality: N/A;  . ESOPHAGOGASTRODUODENOSCOPY (EGD) WITH PROPOFOL N/A 07/05/2017   Procedure: ESOPHAGOGASTRODUODENOSCOPY (EGD) WITH PROPOFOL;  Surgeon: Jonathon Bellows, MD;  Location: [facility] ENDOSCOPY;  Service: Gastroenterology;  Laterality: N/A;  . FRACTURE SURGERY    . ORIF ANKLE FRACTURE Left 1990's    Allergies Onion  Social History Social History   Tobacco Use  . Smoking status: Current Every Day Smoker    Packs/day: 0.25    Years: 10.00    Pack years: 2.50    Types: Cigarettes  . Smokeless tobacco: Never Used  Substance Use Topics  . Alcohol use: No    Alcohol/week: 0.0 standard drinks  . Drug use: No    Review of Systems Constitutional: Negative for fever. Cardiovascular: Negative for chest pain. Respiratory: Negative for shortness of breath. Gastrointestinal: Positive for abdominal pain, vomiting Musculoskeletal: Negative for back pain. Skin: Negative for rash. Neurological: Negative for headaches, focal weakness or numbness.  All systems negative/normal/unremarkable except as stated in the HPI  ____________________________________________   PHYSICAL EXAM:  VITAL SIGNS: ED Triage Vitals  Enc Vitals Group     BP 09/15/19 0726 106/77     Pulse Rate 09/15/19 0726 (!) 107     Resp  09/15/19 0726 18     Temp 09/15/19 0726 98.7 F (37.1 C)     Temp Source 09/15/19 0726 Oral     SpO2 09/15/19 0726 97 %     Weight 09/15/19 0727 220 lb (99.8 kg)     Height 09/15/19 0727 5\' 7"  (1.702 m)     Head Circumference --      Peak Flow --      Pain Score 09/15/19 0727 7     Pain Loc --      Pain  Edu? --      Excl. in [place]? --     Constitutional: Alert and oriented. Well appearing and in no distress. Eyes: Conjunctivae are normal. Normal extraocular movements. Cardiovascular: Normal rate, regular rhythm. No murmurs, rubs, or gallops. Respiratory: Normal respiratory effort without tachypnea nor retractions. Breath sounds are clear and equal bilaterally. No wheezes/rales/rhonchi. Gastrointestinal: Mild epigastric tenderness, no rebound or guarding.  Normal bowel sounds. Musculoskeletal: Nontender with normal range of motion in extremities. No lower extremity tenderness nor edema. Neurologic:  Normal speech and language. No gross focal neurologic deficits are appreciated.  Skin:  Skin is warm, dry and intact. No rash noted. Psychiatric: Mood and affect are normal. Speech and behavior are normal.  ____________________________________________  ED COURSE:  As part of my medical decision making, I reviewed the following data within the [place] History obtained from family if available, nursing notes, old chart and ekg, as well as notes from prior ED visits. Patient presented for abdominal pain, vomiting, we will assess with labs and imaging as indicated at this time.   Procedures  Jasmine Mason was evaluated in [facility] on 09/15/2019 for the symptoms described in the history of present illness. She was evaluated in the context of the global COVID-19 pandemic, which necessitated consideration that the patient might be at risk for infection with the SARS-CoV-2 virus that causes COVID-19. Institutional protocols and algorithms that pertain to the evaluation of patients at risk for COVID-19 are in a state of rapid change based on information released by regulatory bodies including the CDC and federal and state organizations. These policies and algorithms were followed during the patient's care in the ED.  ____________________________________________   LABS  (pertinent positives/negatives)  Labs Reviewed  CBC WITH DIFFERENTIAL/PLATELET - Abnormal; Notable for the following components:      Result Value   WBC 17.7 (*)    RBC 5.46 (*)    Hemoglobin 16.2 (*)    HCT 48.2 (*)    Neutro Abs 13.5 (*)    Monocytes Absolute 1.4 (*)    All other components within normal limits  COMPREHENSIVE METABOLIC PANEL - Abnormal; Notable for the following components:   Potassium 2.9 (*)    Glucose, Bld 133 (*)    Total Protein 8.4 (*)    Total Bilirubin 1.3 (*)    All other components within normal limits  LIPASE, BLOOD  URINALYSIS, COMPLETE (UACMP) WITH MICROSCOPIC    RADIOLOGY Abd two view IMPRESSION: No acute findings  ____________________________________________   DIFFERENTIAL DIAGNOSIS   Gastritis, peptic ulcer disease, chronic pain, gastroparesis, dehydration, electrolyte abnormality  FINAL ASSESSMENT AND PLAN  Abdominal pain, vomiting   Plan: The patient had presented for abdominal pain and vomiting. Patient's labs revealed chronic leukocytosis but no other acute process. Patient's imaging was unremarkable.  Patient felt better particularly after GI cocktail with lidocaine.  Also gave Reglan.  I will try to write for Reglan for her at home  and see if we can refill antiemetics for her also.  She is cleared for outpatient follow-up with GI.   Laurence Aly, MD    Note: This note was generated in part or whole with voice recognition software. Voice recognition is usually quite accurate but there are transcription errors that can and very often do occur. I apologize for any typographical errors that were not detected and corrected.     Earleen Newport, MD 09/15/19 [phone]

## 2019-09-15 NOTE — ED Notes (Signed)
Pt reports feeling better and says symptoms have resolved.

## 2019-09-27 ENCOUNTER — Emergency Department
Admission: EM | Admit: 2019-09-27 | Discharge: 2019-09-27 | Disposition: A | Discharge status: Home / Self Care | Payer: Self-pay | Attending: Emergency Medicine | Admitting: Emergency Medicine

## 2019-09-27 ENCOUNTER — Emergency Department: Payer: Self-pay

## 2019-09-27 ENCOUNTER — Encounter: Payer: Self-pay | Admitting: Emergency Medicine

## 2019-09-27 ENCOUNTER — Other Ambulatory Visit: Payer: Self-pay

## 2019-09-27 DIAGNOSIS — F17210 Nicotine dependence, cigarettes, uncomplicated: Secondary | ICD-10-CM | POA: Insufficient documentation

## 2019-09-27 DIAGNOSIS — R112 Nausea with vomiting, unspecified: Secondary | ICD-10-CM | POA: Insufficient documentation

## 2019-09-27 DIAGNOSIS — I10 Essential (primary) hypertension: Secondary | ICD-10-CM | POA: Insufficient documentation

## 2019-09-27 DIAGNOSIS — R1011 Right upper quadrant pain: Secondary | ICD-10-CM | POA: Insufficient documentation

## 2019-09-27 DIAGNOSIS — Z79899 Other long term (current) drug therapy: Secondary | ICD-10-CM | POA: Insufficient documentation

## 2019-09-27 LAB — BASIC METABOLIC PANEL
Anion gap: 14 (ref 5–15)
BUN: 10 mg/dL (ref 6–20)
CO2: 25 mmol/L (ref 22–32)
Calcium: 9 mg/dL (ref 8.9–10.3)
Chloride: 101 mmol/L (ref 98–111)
Creatinine, Ser: 0.68 mg/dL (ref 0.44–1.00)
GFR calc Af Amer: >60 mL/min (ref >60)
GFR calc non Af Amer: >60 mL/min (ref >60)
Glucose, Bld: 112 mg/dL — ABNORMAL HIGH (ref 70–99)
Potassium: 3.2 mmol/L — ABNORMAL LOW (ref 3.5–5.1)
Sodium: 140 mmol/L (ref 135–145)

## 2019-09-27 LAB — CBC WITH DIFFERENTIAL/PLATELET
Abs Immature Granulocytes: 0.07 10*3/uL (ref 0.00–0.07)
Basophils Absolute: 0.1 10*3/uL (ref 0.0–0.1)
Basophils Relative: 0 %
Eosinophils Absolute: 0.1 10*3/uL (ref 0.0–0.5)
Eosinophils Relative: 0 %
HCT: 46.7 % — ABNORMAL HIGH (ref 36.0–46.0)
Hemoglobin: 15.6 g/dL — ABNORMAL HIGH (ref 12.0–15.0)
Immature Granulocytes: 0 %
Lymphocytes Relative: 26 %
Lymphs Abs: 4.3 10*3/uL — ABNORMAL HIGH (ref 0.7–4.0)
MCH: 30.1 pg (ref 26.0–34.0)
MCHC: 33.4 g/dL (ref 30.0–36.0)
MCV: 90.2 fL (ref 80.0–100.0)
Monocytes Absolute: 1 10*3/uL (ref 0.1–1.0)
Monocytes Relative: 6 %
Neutro Abs: 11.1 10*3/uL — ABNORMAL HIGH (ref 1.7–7.7)
Neutrophils Relative %: 68 %
Platelets: 389 10*3/uL (ref 150–400)
RBC: 5.18 MIL/uL — ABNORMAL HIGH (ref 3.87–5.11)
RDW: 12.6 % (ref 11.5–15.5)
WBC: 16.6 10*3/uL — ABNORMAL HIGH (ref 4.0–10.5)
nRBC: 0 % (ref 0.0–0.2)

## 2019-09-27 LAB — HEPATIC FUNCTION PANEL
ALT: 20 U/L (ref 0–44)
AST: 17 U/L (ref 15–41)
Albumin: 4.2 g/dL (ref 3.5–5.0)
Alkaline Phosphatase: 83 U/L (ref 38–126)
Bilirubin, Direct: 0.1 mg/dL (ref 0.0–0.2)
Indirect Bilirubin: 0.9 mg/dL (ref 0.3–0.9)
Total Bilirubin: 1 mg/dL (ref 0.3–1.2)
Total Protein: 7.8 g/dL (ref 6.5–8.1)

## 2019-09-27 LAB — LIPASE, BLOOD: Lipase: 21 U/L (ref 11–51)

## 2019-09-27 MED ORDER — FAMOTIDINE IN NACL 20-0.9 MG/50ML-% IV SOLN
20.0000 mg | Freq: Once | INTRAVENOUS | Status: AC
  Administered 2019-09-27: 09:00:00 20 mg via INTRAVENOUS
  Filled 2019-09-27: qty 50

## 2019-09-27 MED ORDER — SODIUM CHLORIDE 0.9 % IV BOLUS
1000.0000 mL | Freq: Once | INTRAVENOUS | Status: AC
  Administered 2019-09-27: 1000 mL via INTRAVENOUS

## 2019-09-27 MED ORDER — METOCLOPRAMIDE HCL 10 MG PO TABS: 10.0000 mg | ORAL_TABLET | Freq: Three times a day (TID) | ORAL | 0 refills | Status: AC | PRN

## 2019-09-27 MED ORDER — METOCLOPRAMIDE HCL 5 MG/ML IJ SOLN
10.0000 mg | Freq: Once | INTRAMUSCULAR | Status: AC
  Administered 2019-09-27: 10 mg via INTRAVENOUS
  Filled 2019-09-27: qty 2

## 2019-09-27 MED ORDER — MORPHINE SULFATE (PF) 4 MG/ML IV SOLN
4.0000 mg | Freq: Once | INTRAVENOUS | Status: AC
  Administered 2019-09-27: 4 mg via INTRAVENOUS
  Filled 2019-09-27: qty 1

## 2019-09-27 NOTE — ED Triage Notes (Signed)
FIRST NURSE NOTE:  Pt reports abdominal pain and vomiting x 2 days, pt provided wheelchair on arrival.

## 2019-09-27 NOTE — ED Triage Notes (Signed)
Pt presents to ED via POV with c/o abdominal pain and vomiting x 3 days. Pt states pain 8/10 at this time. Pt states pain to R side of her abdomen at this time. Pt states she has thrown up "a lot" in the last 24 hrs. Pt states hx of HTN but has been unable to keep her meds down, states nausea medication has been making her sicker.

## 2019-09-27 NOTE — ED Provider Notes (Signed)
[facility] Emergency Department Provider Note ____________________________________________   First MD Initiated Contact with Patient 09/27/19 [phone]     (approximate)  I have reviewed the triage vital signs and the nursing notes.   HISTORY  Chief Complaint Abdominal Pain and Emesis    HPI Jasmine Mason is a 34 y.o. female with PMH as noted below including GERD and gastritis who presents with nausea and vomiting for the last 3 days, persistent course, and associated with mid and right-sided upper abdominal pain. The patient denies any diarrhea or fever. She states that the symptoms are somewhat similar to what she has presented with in the ER before, but with somewhat more intense pain.  Past Medical History:  Diagnosis Date  . Back pain   . Back pain   . Gastritis   . GERD (gastroesophageal reflux disease)   . Hypertension   . Mesenteric adenitis   . Obesity   . PCOS (polycystic ovarian syndrome)   . Tobacco abuse     Patient Active Problem List   Diagnosis Date Noted  . 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 Encounter for smoking cessation counseling 04/18/2017  . Essential hypertension 04/18/2017  . Epigastric abdominal pain   . Acute gastritis 09/20/2016  . Nausea with vomiting   . Hiatal hernia   . Uncontrollable vomiting 03/10/2016  . Abdominal pain, epigastric   . GERD (gastroesophageal reflux disease) 01/24/2015  . Hypokalemia 01/24/2015  . Abdominal pain 01/24/2015  . Leukocytosis 01/24/2015  . Chest pain 01/24/2015  . Cough 01/24/2015  . Pain of left calf 01/24/2015  . Elevated troponin 01/24/2015  . Obesity   . Mesenteric adenitis   . Tobacco abuse     Past Surgical History:  Procedure Laterality Date  . CARPAL TUNNEL RELEASE Left ~ 2013  . COLONOSCOPY WITH PROPOFOL N/A 08/08/2017   Procedure: COLONOSCOPY WITH PROPOFOL;  Surgeon: Jonathon Bellows, MD;  Location: [facility] ENDOSCOPY;  Service: Gastroenterology;  Laterality: N/A;  .  ESOPHAGOGASTRODUODENOSCOPY N/A 01/25/2015   Procedure: ESOPHAGOGASTRODUODENOSCOPY (EGD);  Surgeon: Irene Shipper, MD;  Location: [facility] ENDOSCOPY;  Service: Endoscopy;  Laterality: N/A;  . ESOPHAGOGASTRODUODENOSCOPY (EGD) WITH PROPOFOL N/A 03/13/2016   Procedure: ESOPHAGOGASTRODUODENOSCOPY (EGD) WITH PROPOFOL;  Surgeon: Lucilla Lame, MD;  Location: [facility] ENDOSCOPY;  Service: Endoscopy;  Laterality: N/A;  . ESOPHAGOGASTRODUODENOSCOPY (EGD) WITH PROPOFOL N/A 07/05/2017   Procedure: ESOPHAGOGASTRODUODENOSCOPY (EGD) WITH PROPOFOL;  Surgeon: Jonathon Bellows, MD;  Location: [facility] ENDOSCOPY;  Service: Gastroenterology;  Laterality: N/A;  . FRACTURE SURGERY    . ORIF ANKLE FRACTURE Left 1990's    Prior to Admission medications   Medication Sig Start Date End Date Taking? Authorizing Provider  alum & mag hydroxide-simeth (MAALOX MAX) 400-400-40 MG/5ML suspension Take 5 mLs by mouth every 6 (six) hours as needed for indigestion. 09/15/19   Earleen Newport, MD  hydrochlorothiazide (HYDRODIURIL) 12.5 MG tablet Take 1 tablet (12.5 mg total) by mouth daily. 09/15/19   Earleen Newport, MD  metoCLOPramide (REGLAN) 10 MG tablet Take 1 tablet (10 mg total) by mouth every 8 (eight) hours as needed for nausea or vomiting. 09/27/19 09/26/20  Arta Silence, MD  pantoprazole (PROTONIX) 20 MG tablet Take 1 tablet (20 mg total) by mouth daily. 09/14/19 09/13/20  Nance Pear, MD  promethazine (PHENERGAN) 12.5 MG tablet Take 1 tablet (12.5 mg total) by mouth every 6 (six) hours as needed for nausea or vomiting. 08/10/19   Blake Divine, MD  famotidine (PEPCID) 20 MG tablet Take 1 tablet (20 mg  total) by mouth daily. 09/26/18 01/14/19  Cuthriell, Charline Bills, PA-C    Allergies Onion  Family History  Problem Relation Age of Onset  . Hypertension Mother   . Hypertension Father   . Diabetes Father   . Heart disease Father   . Hypertension Sister   . Throat cancer Maternal Grandfather   . Stroke Paternal Grandmother   .  Diabetes Paternal Grandmother   . Ovarian cancer Neg Hx   . Colon cancer Neg Hx   . Breast cancer Neg Hx     Social History Social History   Tobacco Use  . Smoking status: Current Every Day Smoker    Packs/day: 0.25    Years: 10.00    Pack years: 2.50    Types: Cigarettes  . Smokeless tobacco: Never Used  Substance Use Topics  . Alcohol use: No    Alcohol/week: 0.0 standard drinks  . Drug use: No    Review of Systems  Constitutional: No fever/chills. Eyes: No redness. ENT: No sore throat. Cardiovascular: Denies chest pain. Respiratory: Denies shortness of breath. Gastrointestinal: Positive for nausea and vomiting. No diarrhea.  Genitourinary: Negative for dysuria.  Musculoskeletal: Negative for back pain. Skin: Negative for rash. Neurological: Negative for headache.   ____________________________________________   PHYSICAL EXAM:  VITAL SIGNS: ED Triage Vitals  Enc Vitals Group     BP 09/27/19 0846 (!) 185/128     Pulse Rate 09/27/19 0846 84     Resp 09/27/19 0846 (!) 22     Temp 09/27/19 0846 98.2 F (36.8 C)     Temp Source 09/27/19 0846 Oral     SpO2 09/27/19 0846 92 %     Weight 09/27/19 0847 220 lb (99.8 kg)     Height 09/27/19 0847 5\' 7"  (1.702 m)     Head Circumference --      Peak Flow --      Pain Score 09/27/19 0847 8     Pain Loc --      Pain Edu? --      Excl. in [place]? --     Constitutional: Alert and oriented. Uncomfortable appearing but in no acute distress. Eyes: Conjunctivae are normal. No scleral icterus. Head: Atraumatic. Nose: No congestion/rhinnorhea. Mouth/Throat: Mucous membranes are moist.   Neck: Normal range of motion.  Cardiovascular: Normal rate, regular rhythm.  Good peripheral circulation. Respiratory: Normal respiratory effort.  No retractions.  Gastrointestinal: Soft with mild to moderate right upper quadrant and epigastric tenderness. No distention.  Genitourinary: No flank tenderness. Musculoskeletal: Extremities  warm and well perfused.  Neurologic:  Normal speech and language. No gross focal neurologic deficits are appreciated.  Skin:  Skin is warm and dry. No rash noted. Psychiatric: Mood and affect are normal. Speech and behavior are normal.  ____________________________________________   LABS (all labs ordered are listed, but only abnormal results are displayed)  Labs Reviewed  BASIC METABOLIC PANEL - Abnormal; Notable for the following components:      Result Value   Potassium 3.2 (*)    Glucose, Bld 112 (*)    All other components within normal limits  CBC WITH DIFFERENTIAL/PLATELET - Abnormal; Notable for the following components:   WBC 16.6 (*)    RBC 5.18 (*)    Hemoglobin 15.6 (*)    HCT 46.7 (*)    Neutro Abs 11.1 (*)    Lymphs Abs 4.3 (*)    All other components within normal limits  HEPATIC FUNCTION PANEL  LIPASE, BLOOD   ____________________________________________  EKG   ____________________________________________  RADIOLOGY  US abdomen RUQ: No acute abnormality    ED ECG REPORT I, Arta Silence, the attending physician, personally viewed and interpreted this ECG.  Date: 09/27/2019 EKG Time: 0859 Rate: 94 Rhythm: normal sinus rhythm QRS Axis: normal Intervals: normal ST/T Wave abnormalities: normal Narrative Interpretation: no evidence of acute ischemia   ____________________________________________   PROCEDURES  Procedure(s) performed: No  Procedures  Critical Care performed: No ____________________________________________   INITIAL IMPRESSION / ASSESSMENT AND PLAN / ED COURSE  Pertinent labs & imaging results that were available during my care of the patient were reviewed by me and considered in my medical decision making (see chart for details).  34 year old female with PMH as noted above presents with 3 days of nausea and vomiting associated with upper abdominal pain, slightly more to the right. She has had no diarrhea or  fever.  I reviewed the past medical records in epic. The patient has multiple prior ED visits for vomiting and upper abdominal pain thought to be due to gastritis. She was seen in the ED for this on 1/18, 2/22, and 2/23. She had a negative CT of the abdomen on 1/18 and a negative abdominal x-ray on 2/23. She also had a CT negative for acute findings on 7/9 last year.  On exam, the patient is uncomfortable but not acutely ill-appearing. Her abdomen is soft but she does have some epigastric and right upper quadrant tenderness. Her vital signs are normal except for hypertension.  Overall presentation is most consistent with gastritis exacerbation similar to prior presentations, although differential also includes gastroenteritis, pancreatitis, biliary colic, or other hepatobiliary etiology. I do not suspect SBO, colitis, or diverticulitis or acute appendicitis. We will give IV fluids, Reglan, morphine, and reassess. Given the pain slightly more on the right, I will obtain a right upper quadrant ultrasound.  ----------------------------------------- 12:26 PM on 09/27/2019 -----------------------------------------  The lab work-up is unremarkable.  The patient does have leukocytosis although this is chronic and actually improved from prior.  Ultrasound is negative.  The patient reported improvement in her symptoms with the initial medications.  She now states that she feels significantly better and is tolerating p.o.  She longer has any abdominal pain.  The patient is stable for discharge home.  She was apparently previously prescribed for Reglan but states that she only has Zofran at home so I have a represcribed the Reglan.  Return precautions given, and she expresses understanding.   ____________________________________________   FINAL CLINICAL IMPRESSION(S) / ED DIAGNOSES  Final diagnoses:  Right upper quadrant abdominal pain  Non-intractable vomiting with nausea, unspecified vomiting type       NEW MEDICATIONS STARTED DURING THIS VISIT:  New Prescriptions   METOCLOPRAMIDE (REGLAN) 10 MG TABLET    Take 1 tablet (10 mg total) by mouth every 8 (eight) hours as needed for nausea or vomiting.     Note:  This document was prepared using Dragon voice recognition software and may include unintentional dictation errors.    Arta Silence, MD 09/27/19 1227

## 2019-09-27 NOTE — Discharge Instructions (Addendum)
Return to the ER for new, worsening, or persistent severe vomiting or abdominal pain, fever, weakness, or any other new or worsening symptoms that concern you.  Take the Reglan (metoclopramide) up to 3 times daily as needed for nausea.

## 2019-11-11 ENCOUNTER — Ambulatory Visit: Payer: Self-pay | Admitting: Family Medicine

## 2019-11-11 NOTE — Telephone Encounter (Signed)
This patient was discharged from our office on 06/22/2019.

## 2019-11-11 NOTE — Telephone Encounter (Signed)
Pt. Called to report onset of cough, shortness of breath on Monday.  C/o coughing up yellow phlegm, vomiting x approx. 6 times, and diarrheal stools x approx. 8 times over past 24 hrs.  C/o dizziness and weakness.  Stated shortness of breath is increased with activity.  Reported fever last night of 99.3.  Denied fever today.  Advised to go to the ER at this time.  Confirmed that she has someone with her now, that will take her to the ER.    Reason for Disposition . Patient sounds very sick or weak to the triager    Reported cough, shortness of breath, weakness, dizziness, multiple diarrhea stools and multiple vomiting episodes over past 24 hrs.  Advised to go to ER now.  Answer Assessment - Initial Assessment Questions 1. COVID-19 DIAGNOSIS: "Who made your Coronavirus (COVID-19) diagnosis?" "Was it confirmed by a positive lab test?" If not diagnosed by a HCP, ask "Are there lots of cases (community spread) where you live?" (See public health department website, if unsure)     Has not tested for COVID  2. COVID-19 EXPOSURE: "Was there any known exposure to COVID before the symptoms began?" CDC Definition of close contact: within 6 feet (2 meters) for a total of 15 minutes or more over a 24-hour period.      Went to funeral last week 3. ONSET: "When did the COVID-19 symptoms start?"      Monday, 4/19 4. WORST SYMPTOM: "What is your worst symptom?" (e.g., cough, fever, shortness of breath, muscle aches)     Body aches, sore throat 5. COUGH: "Do you have a cough?" If so, ask: "How bad is the cough?"       Yes; cough is intermittent ; coughing up yellow phlegm 6. FEVER: "Do you have a fever?" If so, ask: "What is your temperature, how was it measured, and when did it start?"     99.3 last eve.  "It was 98. Something today." 7. RESPIRATORY STATUS: "Describe your breathing?" (e.g., shortness of breath, wheezing, unable to speak)      Reports shortness of breath that is worse with walking about  8.  BETTER-SAME-WORSE: "Are you getting better, staying the same or getting worse compared to yesterday?"  If getting worse, ask, "In what way?"     Worse  9. HIGH RISK DISEASE: "Do you have any chronic medical problems?" (e.g., asthma, heart or lung disease, weak immune system, obesity, etc.)     Hx of bronchitis 10. PREGNANCY: "Is there any chance you are pregnant?" "When was your last menstrual period?"       Last menstrual cycle on 3/26 11. OTHER SYMPTOMS: "Do you have any other symptoms?"  (e.g., chills, fatigue, headache, loss of smell or taste, muscle pain, sore throat; new loss of smell or taste especially support the diagnosis of COVID-19)       Vomiting x 6 , diarrhea x 8 times, c/o weakness, c/o dizziness, very thirsty  Protocols used: CORONAVIRUS (COVID-19) DIAGNOSED OR SUSPECTED-A-AH

## 2021-04-26 IMAGING — DX DG CHEST 1V PORT
1 series · 1 of 1 positions shown · non-contrast
Comparison: November 04, 2018

CLINICAL DATA: Cough and fever

EXAM:
PORTABLE CHEST 1 VIEW

[chest ap]
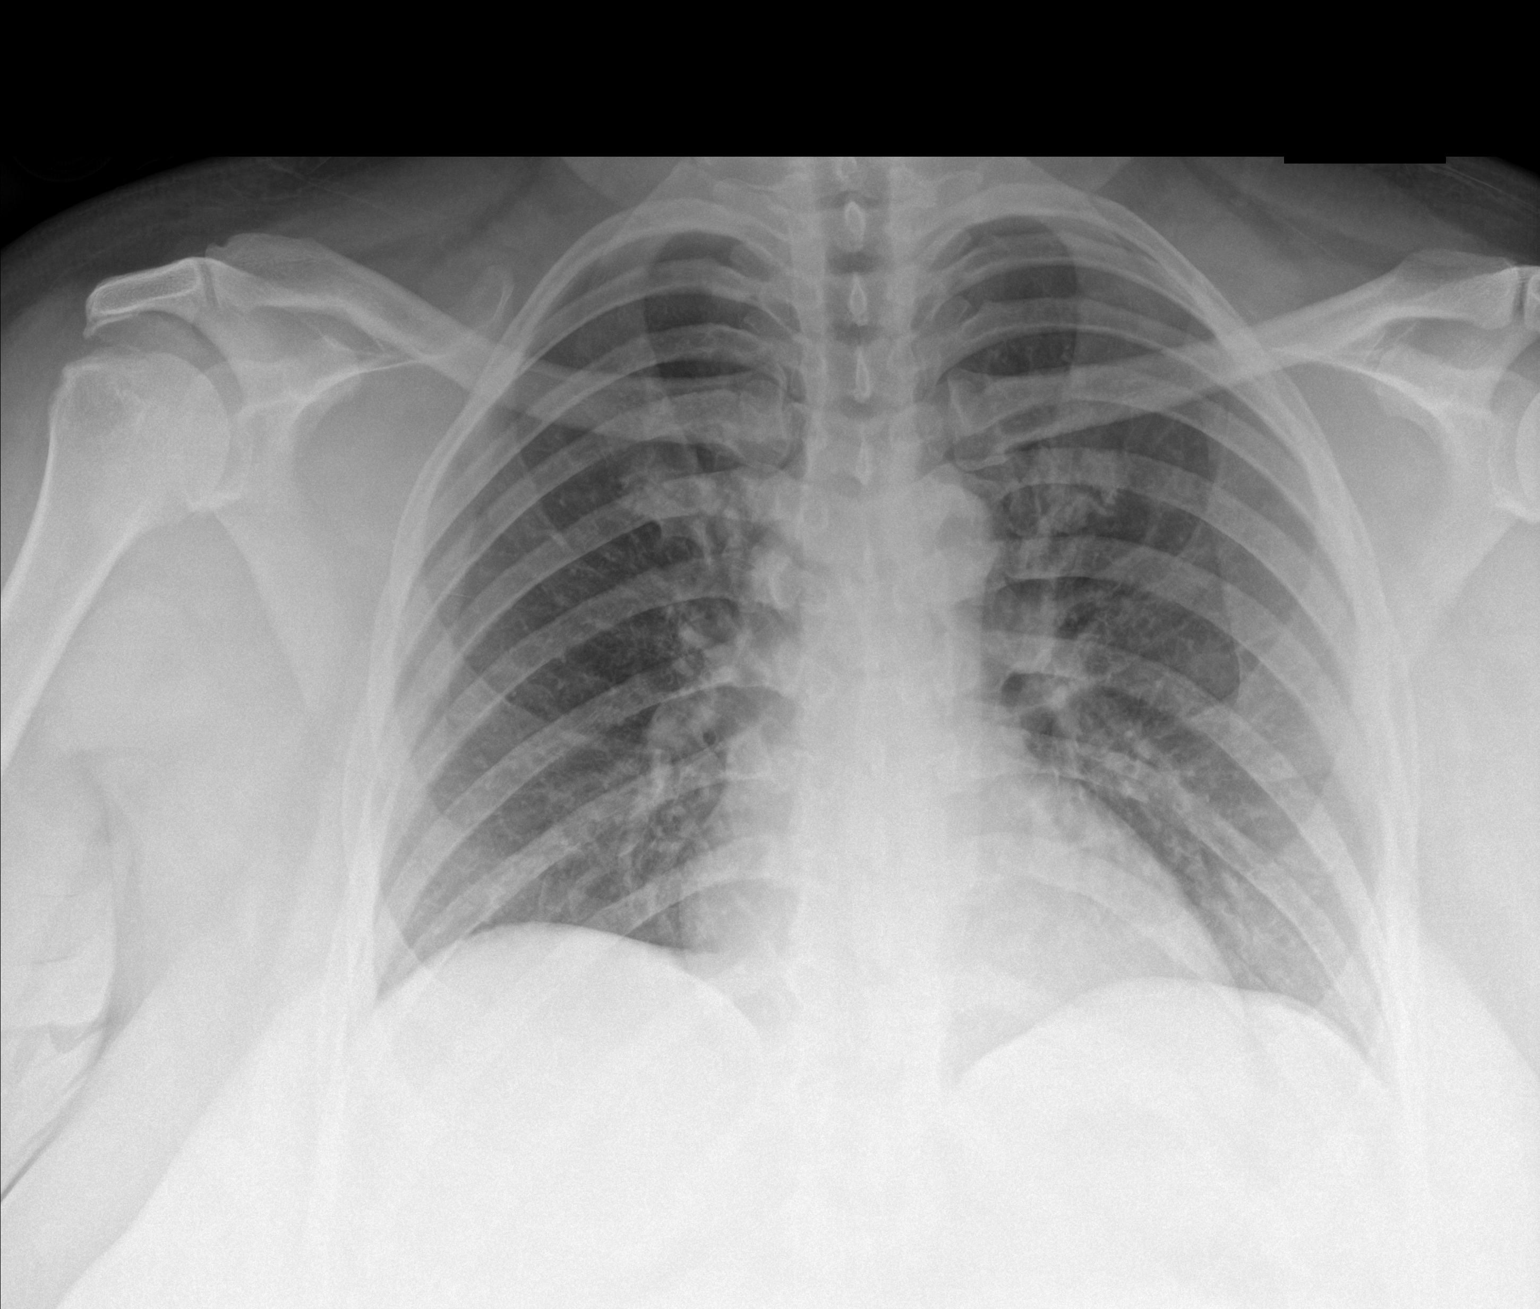

[1 of 1 positions shown; findings below may reference images not displayed]

FINDINGS: Lungs are clear. Heart size and pulmonary vascularity are normal. No
adenopathy. No bone lesions.
IMPRESSION: No edema or consolidation.
